# Patient Record
Sex: Female | Born: 1980 | Race: White | Hispanic: No | Marital: Married | Smoking: Current some day smoker
Health system: Southern US, Community
[De-identification: ages and names within clinical notes are randomized; demographics above are authoritative.]

## PROBLEM LIST (undated history)

## (undated) DIAGNOSIS — F319 Bipolar disorder, unspecified: Secondary | ICD-10-CM

## (undated) DIAGNOSIS — G629 Polyneuropathy, unspecified: Secondary | ICD-10-CM

## (undated) DIAGNOSIS — J189 Pneumonia, unspecified organism: Secondary | ICD-10-CM

## (undated) DIAGNOSIS — M1990 Unspecified osteoarthritis, unspecified site: Secondary | ICD-10-CM

## (undated) DIAGNOSIS — E559 Vitamin D deficiency, unspecified: Secondary | ICD-10-CM

## (undated) DIAGNOSIS — M810 Age-related osteoporosis without current pathological fracture: Secondary | ICD-10-CM

## (undated) DIAGNOSIS — Z8742 Personal history of other diseases of the female genital tract: Secondary | ICD-10-CM

## (undated) DIAGNOSIS — N83209 Unspecified ovarian cyst, unspecified side: Secondary | ICD-10-CM

## (undated) DIAGNOSIS — F410 Panic disorder [episodic paroxysmal anxiety] without agoraphobia: Secondary | ICD-10-CM

## (undated) DIAGNOSIS — Z8639 Personal history of other endocrine, nutritional and metabolic disease: Secondary | ICD-10-CM

## (undated) DIAGNOSIS — I639 Cerebral infarction, unspecified: Secondary | ICD-10-CM

## (undated) DIAGNOSIS — E669 Obesity, unspecified: Secondary | ICD-10-CM

## (undated) DIAGNOSIS — E119 Type 2 diabetes mellitus without complications: Secondary | ICD-10-CM

## (undated) DIAGNOSIS — F419 Anxiety disorder, unspecified: Secondary | ICD-10-CM

## (undated) DIAGNOSIS — M797 Fibromyalgia: Secondary | ICD-10-CM

## (undated) DIAGNOSIS — B369 Superficial mycosis, unspecified: Secondary | ICD-10-CM

## (undated) DIAGNOSIS — I10 Essential (primary) hypertension: Secondary | ICD-10-CM

## (undated) HISTORY — DX: Type 2 diabetes mellitus without complications: E11.9

## (undated) HISTORY — DX: Superficial mycosis, unspecified: B36.9

## (undated) HISTORY — DX: Cerebral infarction, unspecified: I63.9

## (undated) HISTORY — DX: Age-related osteoporosis without current pathological fracture: M81.0

## (undated) HISTORY — DX: Fibromyalgia: M79.7

## (undated) HISTORY — PX: THROAT SURGERY: SHX803

## (undated) HISTORY — PX: HERNIA REPAIR: SHX51

## (undated) HISTORY — DX: Personal history of other endocrine, nutritional and metabolic disease: Z86.39

## (undated) HISTORY — DX: Obesity, unspecified: E66.9

## (undated) HISTORY — DX: Unspecified ovarian cyst, unspecified side: N83.209

## (undated) HISTORY — DX: Personal history of other diseases of the female genital tract: Z87.42

## (undated) HISTORY — DX: Vitamin D deficiency, unspecified: E55.9

## (undated) HISTORY — DX: Polyneuropathy, unspecified: G62.9

## (undated) NOTE — *Deleted (*Deleted)
10/07/2019   I have seen and evaluated the patient after in hospital PEA arrest.  S  Post L BKA for osteomyelitis around noon today  Last seen normal around 3PM when she was given 1mg  dilaudid for pain control  Around 5PM noticed to be unresponsive and pulseless, PEA  4 minutes CPR  Initial ABG showing marked CO2 retention as well as metabolic acidosis  Underlying poorly controlled DM, bipolar, opiate dependence,CKD  O: Blood pressure 121/63, pulse 94, temperature 98.8 F (37.1 C), temperature source Oral, resp. rate 16, height 5\' 6"  (1.676 m), weight 104.3 kg, SpO2 99 %.  Unresponsive obese woman on vent L BKA site looks dressed without strikethrough Lungs clear Has no cough, no gag, pupils fixed/dilated, no corneals, no doll's, GCS3 Has normal compliance on ventilator Bedside echo with normal biventricular function  ABG 6.82/92/103 post ROSC  A:  Cardiac arrest due to acidemia- respiratory compromise on top of AKI on CKD causing reduced acid secretion.  Combine this with E coli bacteremia, recent surgery, she was unable to keep up with metabolic demand.  Post arrest poor neuro exam- difficult to prognosticate without fixing acidemia first.  AKI on CKD- nephro following  P:  - Standing tylenol, avoid fevers - Vent settings increased and patient given bicarb pushes and drip - Once pH normalizes and if patient remains unresponsive, may benefit from head CT for prognosticating purposes - Unasyn fine for now, levophed titrated to MAP 65 - Place foley - Guarded prognosis  The patient is critically ill with multiple organ systems failure and requires high complexity decision making for assessment and support, frequent evaluation and titration of therapies, application of advanced monitoring technologies and extensive interpretation of multiple databases. Critical Care Time devoted to patient care services described in this note independent of APP/resident  time is ***  minutes.   10/21/2019 Erskine Emery MD

---

## 2002-06-14 ENCOUNTER — Emergency Department (HOSPITAL_COMMUNITY): Admission: EM | Admit: 2002-06-14 | Discharge: 2002-06-14 | Payer: Self-pay | Admitting: *Deleted

## 2002-06-15 ENCOUNTER — Encounter: Payer: Self-pay | Admitting: *Deleted

## 2005-01-09 ENCOUNTER — Emergency Department (HOSPITAL_COMMUNITY): Admission: EM | Admit: 2005-01-09 | Discharge: 2005-01-09 | Payer: Self-pay | Admitting: Emergency Medicine

## 2005-01-12 ENCOUNTER — Emergency Department (HOSPITAL_COMMUNITY): Admission: EM | Admit: 2005-01-12 | Discharge: 2005-01-12 | Payer: Self-pay | Admitting: Emergency Medicine

## 2005-07-01 ENCOUNTER — Emergency Department (HOSPITAL_COMMUNITY): Admission: EM | Admit: 2005-07-01 | Discharge: 2005-07-01 | Payer: Self-pay | Admitting: Emergency Medicine

## 2005-11-10 ENCOUNTER — Emergency Department (HOSPITAL_COMMUNITY): Admission: EM | Admit: 2005-11-10 | Discharge: 2005-11-10 | Payer: Self-pay | Admitting: Emergency Medicine

## 2005-12-07 ENCOUNTER — Emergency Department (HOSPITAL_COMMUNITY): Admission: EM | Admit: 2005-12-07 | Discharge: 2005-12-08 | Payer: Self-pay | Admitting: Emergency Medicine

## 2006-03-15 ENCOUNTER — Emergency Department (HOSPITAL_COMMUNITY): Admission: EM | Admit: 2006-03-15 | Discharge: 2006-03-15 | Payer: Self-pay | Admitting: Emergency Medicine

## 2006-05-25 ENCOUNTER — Emergency Department (HOSPITAL_COMMUNITY): Admission: EM | Admit: 2006-05-25 | Discharge: 2006-05-25 | Payer: Self-pay | Admitting: Emergency Medicine

## 2006-08-18 ENCOUNTER — Emergency Department (HOSPITAL_COMMUNITY): Admission: EM | Admit: 2006-08-18 | Discharge: 2006-08-18 | Payer: Self-pay | Admitting: Emergency Medicine

## 2006-10-04 ENCOUNTER — Emergency Department (HOSPITAL_COMMUNITY): Admission: EM | Admit: 2006-10-04 | Discharge: 2006-10-04 | Payer: Self-pay | Admitting: Emergency Medicine

## 2006-11-26 ENCOUNTER — Emergency Department (HOSPITAL_COMMUNITY): Admission: EM | Admit: 2006-11-26 | Discharge: 2006-11-26 | Payer: Self-pay | Admitting: Emergency Medicine

## 2006-12-21 ENCOUNTER — Emergency Department (HOSPITAL_COMMUNITY): Admission: EM | Admit: 2006-12-21 | Discharge: 2006-12-21 | Payer: Self-pay | Admitting: Emergency Medicine

## 2007-01-30 ENCOUNTER — Emergency Department (HOSPITAL_COMMUNITY): Admission: EM | Admit: 2007-01-30 | Discharge: 2007-01-30 | Payer: Self-pay | Admitting: Emergency Medicine

## 2007-10-16 ENCOUNTER — Emergency Department: Payer: Self-pay | Admitting: Emergency Medicine

## 2008-01-24 ENCOUNTER — Emergency Department: Payer: Self-pay | Admitting: Emergency Medicine

## 2008-05-26 ENCOUNTER — Emergency Department: Payer: Self-pay | Admitting: Emergency Medicine

## 2008-08-14 ENCOUNTER — Ambulatory Visit: Payer: Self-pay

## 2008-12-16 ENCOUNTER — Emergency Department (HOSPITAL_COMMUNITY): Admission: EM | Admit: 2008-12-16 | Discharge: 2008-12-16 | Payer: Self-pay | Admitting: Emergency Medicine

## 2010-02-10 ENCOUNTER — Other Ambulatory Visit: Payer: Self-pay | Admitting: Obstetrics & Gynecology

## 2010-02-10 DIAGNOSIS — R102 Pelvic and perineal pain: Secondary | ICD-10-CM

## 2010-02-13 ENCOUNTER — Encounter (HOSPITAL_COMMUNITY): Payer: Self-pay

## 2010-03-09 ENCOUNTER — Other Ambulatory Visit (HOSPITAL_COMMUNITY)
Admission: RE | Admit: 2010-03-09 | Discharge: 2010-03-09 | Disposition: A | Discharge status: Home / Self Care | Payer: Medicaid Other | Source: Ambulatory Visit | Attending: Obstetrics and Gynecology | Admitting: Obstetrics and Gynecology

## 2010-03-09 ENCOUNTER — Other Ambulatory Visit: Payer: Self-pay | Admitting: Adult Health

## 2010-03-09 DIAGNOSIS — Z113 Encounter for screening for infections with a predominantly sexual mode of transmission: Secondary | ICD-10-CM | POA: Insufficient documentation

## 2010-03-09 DIAGNOSIS — Z01419 Encounter for gynecological examination (general) (routine) without abnormal findings: Secondary | ICD-10-CM | POA: Insufficient documentation

## 2010-06-02 ENCOUNTER — Ambulatory Visit (HOSPITAL_COMMUNITY)
Admission: RE | Admit: 2010-06-02 | Discharge: 2010-06-02 | Disposition: A | Discharge status: Home / Self Care | Payer: Medicaid Other | Source: Ambulatory Visit | Attending: Obstetrics & Gynecology | Admitting: Obstetrics & Gynecology

## 2010-06-02 ENCOUNTER — Other Ambulatory Visit: Payer: Self-pay | Admitting: Obstetrics & Gynecology

## 2010-06-02 DIAGNOSIS — R102 Pelvic and perineal pain: Secondary | ICD-10-CM

## 2010-06-02 DIAGNOSIS — N949 Unspecified condition associated with female genital organs and menstrual cycle: Secondary | ICD-10-CM | POA: Insufficient documentation

## 2010-06-02 DIAGNOSIS — N938 Other specified abnormal uterine and vaginal bleeding: Secondary | ICD-10-CM | POA: Insufficient documentation

## 2010-08-13 ENCOUNTER — Emergency Department (HOSPITAL_COMMUNITY)
Admission: EM | Admit: 2010-08-13 | Discharge: 2010-08-13 | Discharge status: Against Medical Advice | Payer: Medicaid Other | Attending: Emergency Medicine | Admitting: Emergency Medicine

## 2010-08-13 ENCOUNTER — Encounter: Payer: Self-pay | Admitting: *Deleted

## 2010-08-13 DIAGNOSIS — H9209 Otalgia, unspecified ear: Secondary | ICD-10-CM

## 2010-08-13 DIAGNOSIS — Z5320 Procedure and treatment not carried out because of patient's decision for unspecified reasons: Secondary | ICD-10-CM | POA: Insufficient documentation

## 2010-08-13 HISTORY — DX: Unspecified osteoarthritis, unspecified site: M19.90

## 2010-08-13 NOTE — ED Notes (Signed)
Pt states she has pain to her left ear that radiates down her neck and face.

## 2010-10-13 LAB — DIFFERENTIAL
Basophils Relative: 0
Lymphocytes Relative: 21
Monocytes Absolute: 0.6
Monocytes Relative: 4
Neutro Abs: 11.5 — ABNORMAL HIGH
Neutrophils Relative %: 75

## 2010-10-13 LAB — CBC
Hemoglobin: 12.9
RBC: 4.59
WBC: 15.5 — ABNORMAL HIGH

## 2011-11-21 ENCOUNTER — Emergency Department (HOSPITAL_COMMUNITY)
Admission: EM | Admit: 2011-11-21 | Discharge: 2011-11-21 | Disposition: A | Discharge status: Home / Self Care | Payer: Medicaid Other | Attending: Emergency Medicine | Admitting: Emergency Medicine

## 2011-11-21 ENCOUNTER — Encounter (HOSPITAL_COMMUNITY): Payer: Self-pay | Admitting: *Deleted

## 2011-11-21 DIAGNOSIS — Z794 Long term (current) use of insulin: Secondary | ICD-10-CM | POA: Insufficient documentation

## 2011-11-21 DIAGNOSIS — E119 Type 2 diabetes mellitus without complications: Secondary | ICD-10-CM | POA: Insufficient documentation

## 2011-11-21 DIAGNOSIS — Z8739 Personal history of other diseases of the musculoskeletal system and connective tissue: Secondary | ICD-10-CM | POA: Insufficient documentation

## 2011-11-21 DIAGNOSIS — Z79899 Other long term (current) drug therapy: Secondary | ICD-10-CM | POA: Insufficient documentation

## 2011-11-21 DIAGNOSIS — Z87891 Personal history of nicotine dependence: Secondary | ICD-10-CM | POA: Insufficient documentation

## 2011-11-21 DIAGNOSIS — H9209 Otalgia, unspecified ear: Secondary | ICD-10-CM | POA: Insufficient documentation

## 2011-11-21 DIAGNOSIS — J029 Acute pharyngitis, unspecified: Secondary | ICD-10-CM | POA: Insufficient documentation

## 2011-11-21 MED ORDER — AZITHROMYCIN 250 MG PO TABS: ORAL_TABLET | ORAL | Status: DC

## 2011-11-21 MED ORDER — AZITHROMYCIN 250 MG PO TABS
500.0000 mg | ORAL_TABLET | Freq: Once | ORAL | Status: AC
  Administered 2011-11-21: 500 mg via ORAL
  Filled 2011-11-21: qty 2

## 2011-11-21 NOTE — ED Notes (Signed)
Pt reports a sore swollen throat that started yesterday. Pt states unable to eat or drink anything. Pt spitting into a bottle. No breathing difficulty noted.

## 2011-11-21 NOTE — ED Provider Notes (Signed)
History     CSN: [id number]  Arrival date & time 11/21/11  0504   First MD Initiated Contact with Patient 11/21/11 0534      Chief Complaint  Patient presents with  . Sore Throat    (Consider location/radiation/quality/duration/timing/severity/associated sxs/prior treatment) HPI  Felicia Reynolds is a 31 y.o. female who presents to the Emergency Department complaining of sore throat that began yesterday accompanied by left ear pain with swallowing. Denies fever, chills, cough, nasal congestion. Unable to eat or drink due to pain.   PCP Dr. Legrand Rams  Past Medical History  Diagnosis Date  . Diabetes mellitus   . Arthritis     Past Surgical History  Procedure Date  . Hernia repair   . Cesarean section     No family history on file.  History  Substance Use Topics  . Smoking status: Former Smoker -- 0.5 packs/day    Quit date: 10/31/2011  . Smokeless tobacco: Not on file  . Alcohol Use: No    OB History    Grav Para Term Preterm Abortions TAB SAB Ect Mult Living                  Review of Systems  Constitutional: Negative for fever.       10 Systems reviewed and are negative for acute change except as noted in the HPI.  HENT: Positive for ear pain and sore throat. Negative for congestion.   Eyes: Negative for discharge and redness.  Respiratory: Negative for cough and shortness of breath.   Cardiovascular: Negative for chest pain.  Gastrointestinal: Negative for vomiting and abdominal pain.  Musculoskeletal: Negative for back pain.  Skin: Negative for rash.  Neurological: Negative for syncope, numbness and headaches.  Psychiatric/Behavioral:       No behavior change.    Allergies  Latex  Home Medications   Current Outpatient Rx  Name  Route  Sig  Dispense  Refill  . INSULIN GLARGINE 100 UNIT/ML [place] SOLN   Subcutaneous   Inject 30 Units into the skin at bedtime.          Marland Kitchen HYDROCODONE-ACETAMINOPHEN 5-500 MG PO TABS   Oral   Take 1 tablet by mouth  2 (two) times daily.           . MEGESTROL ACETATE 40 MG PO TABS   Oral   Take 40 mg by mouth as directed. TAPERED DOSE: Take 3 tablets daily for 5 days, then take 2 tablets daily for 5 days, then take 1 tablet daily for 5 days per Pharmacy record.          Marland Kitchen METFORMIN HCL 1000 MG PO TABS   Oral   Take 1,000 mg by mouth 2 (two) times daily with a meal.           . NYSTATIN-TRIAMCINOLONE 100000-0.1 UNIT/GM-% EX CREA   Topical   Apply 1 application topically 2 (two) times daily.           . TRAZODONE HCL 150 MG PO TABS   Oral   Take 150 mg by mouth 2 (two) times daily.             BP 162/92  Pulse 108  Temp 99.1 F (37.3 C) (Oral)  Resp 18  Ht 5' 6.5" (1.689 m)  Wt 236 lb (107.049 kg)  BMI 37.52 kg/m2  SpO2 95%  LMP 09/13/2011  Physical Exam  Nursing note and vitals reviewed. Constitutional: She appears well-developed and well-nourished.  Awake, alert, nontoxic appearance.  HENT:  Head: Normocephalic and atraumatic.  Right Ear: External ear normal.  Left Ear: External ear normal.       Posterior pharynx erythematous, no exudate.  Eyes: Right eye exhibits no discharge. Left eye exhibits no discharge.  Neck: Neck supple.  Cardiovascular: Normal heart sounds.   Pulmonary/Chest: Effort normal and breath sounds normal. She exhibits no tenderness.  Abdominal: Soft. There is no tenderness. There is no rebound.  Musculoskeletal: She exhibits no tenderness.       Baseline ROM, no obvious new focal weakness.  Neurological:       Mental status and motor strength appears baseline for patient and situation.  Skin: No rash noted.  Psychiatric: She has a normal mood and affect.    ED Course  Procedures (including critical care time) Results for orders placed during the hospital encounter of 11/21/11  RAPID STREP SCREEN      Component Value Range   Streptococcus, Group A Screen (Direct) NEGATIVE  NEGATIVE       MDM  Patient presents with a sore throat and  is unable to eat or drink. PE with erythema, no exudate. Strep negative. Will linitiate antibiotic therapy. Pt stable in ED with no significant deterioration in condition.The patient appears reasonably screened and/or stabilized for discharge and I doubt any other medical condition or other [facility] requiring further screening, evaluation, or treatment in the ED at this time prior to discharge.  MDM Reviewed: nursing note and vitals Interpretation: labs         Gypsy Balsam. Olin Hauser, MD 11/21/11 [phone]

## 2011-11-21 NOTE — ED Notes (Signed)
Pt alert & oriented x4, stable gait. Patient given discharge instructions, paperwork & prescription(s). Patient  instructed to stop at the registration desk to finish any additional paperwork. Patient verbalized understanding. Pt left department w/ no further questions. 

## 2011-11-28 ENCOUNTER — Emergency Department (HOSPITAL_COMMUNITY): Payer: Medicaid Other

## 2011-11-28 ENCOUNTER — Encounter (HOSPITAL_COMMUNITY): Payer: Self-pay | Admitting: Emergency Medicine

## 2011-11-28 ENCOUNTER — Inpatient Hospital Stay (HOSPITAL_COMMUNITY)
Admission: EM | Admit: 2011-11-28 | Discharge: 2011-11-29 | DRG: 153 | Disposition: A | Discharge status: Home / Self Care | Payer: Medicaid Other | Attending: Otolaryngology | Admitting: Otolaryngology

## 2011-11-28 DIAGNOSIS — Z87891 Personal history of nicotine dependence: Secondary | ICD-10-CM

## 2011-11-28 DIAGNOSIS — M129 Arthropathy, unspecified: Secondary | ICD-10-CM | POA: Diagnosis present

## 2011-11-28 DIAGNOSIS — J0430 Supraglottitis, unspecified, without obstruction: Secondary | ICD-10-CM | POA: Diagnosis present

## 2011-11-28 DIAGNOSIS — Z794 Long term (current) use of insulin: Secondary | ICD-10-CM

## 2011-11-28 DIAGNOSIS — J0510 Acute epiglottitis without obstruction: Secondary | ICD-10-CM

## 2011-11-28 DIAGNOSIS — J36 Peritonsillar abscess: Principal | ICD-10-CM | POA: Diagnosis present

## 2011-11-28 LAB — BASIC METABOLIC PANEL
CO2: 28 mEq/L (ref 19–32)
Chloride: 100 mEq/L (ref 96–112)
Glucose, Bld: 301 mg/dL — ABNORMAL HIGH (ref 70–99)
Sodium: 136 mEq/L (ref 135–145)

## 2011-11-28 LAB — URINALYSIS, ROUTINE W REFLEX MICROSCOPIC
Glucose, UA: >1000 mg/dL — AB
Nitrite: POSITIVE — AB
Specific Gravity, Urine: >1.03 — ABNORMAL HIGH (ref 1.005–1.030)
pH: 5.5 (ref 5.0–8.0)

## 2011-11-28 LAB — GLUCOSE, CAPILLARY
Glucose-Capillary: 297 mg/dL — ABNORMAL HIGH (ref 70–99)
Glucose-Capillary: 329 mg/dL — ABNORMAL HIGH (ref 70–99)
Glucose-Capillary: 337 mg/dL — ABNORMAL HIGH (ref 70–99)
Glucose-Capillary: 433 mg/dL — ABNORMAL HIGH (ref 70–99)

## 2011-11-28 LAB — MRSA PCR SCREENING: MRSA by PCR: NEGATIVE

## 2011-11-28 LAB — CBC WITH DIFFERENTIAL/PLATELET
Basophils Absolute: 0 10*3/uL (ref 0.0–0.1)
Eosinophils Relative: 0 % (ref 0–5)
HCT: 39.8 % (ref 36.0–46.0)
Lymphocytes Relative: 20 % (ref 12–46)
Lymphs Abs: 2.4 10*3/uL (ref 0.7–4.0)
MCV: 82.9 fL (ref 78.0–100.0)
Neutro Abs: 8.6 10*3/uL — ABNORMAL HIGH (ref 1.7–7.7)
Platelets: 236 10*3/uL (ref 150–400)
RBC: 4.8 MIL/uL (ref 3.87–5.11)
RDW: 12.3 % (ref 11.5–15.5)
WBC: 11.9 10*3/uL — ABNORMAL HIGH (ref 4.0–10.5)

## 2011-11-28 LAB — PREGNANCY, URINE: Preg Test, Ur: NEGATIVE

## 2011-11-28 LAB — URINE MICROSCOPIC-ADD ON

## 2011-11-28 LAB — RAPID STREP SCREEN (MED CTR MEBANE ONLY): Streptococcus, Group A Screen (Direct): NEGATIVE

## 2011-11-28 MED ORDER — POTASSIUM CHLORIDE IN NACL 20-0.45 MEQ/L-% IV SOLN
INTRAVENOUS | Status: DC
  Administered 2011-11-28: 13:00:00 via INTRAVENOUS
  Filled 2011-11-28 (×6): qty 1000

## 2011-11-28 MED ORDER — DEXTROSE 5 % IV SOLN
1.0000 g | Freq: Once | INTRAVENOUS | Status: AC
  Administered 2011-11-28: 1 g via INTRAVENOUS

## 2011-11-28 MED ORDER — MORPHINE SULFATE 2 MG/ML IJ SOLN: 2.0000 mg | INTRAMUSCULAR | Status: DC | PRN

## 2011-11-28 MED ORDER — DEXTROSE 5 % IV SOLN
INTRAVENOUS | Status: AC
  Filled 2011-11-28: qty 10

## 2011-11-28 MED ORDER — SODIUM CHLORIDE 0.9 % IV BOLUS (SEPSIS)
1000.0000 mL | Freq: Once | INTRAVENOUS | Status: AC
  Administered 2011-11-28: 1000 mL via INTRAVENOUS

## 2011-11-28 MED ORDER — DEXAMETHASONE SODIUM PHOSPHATE 4 MG/ML IJ SOLN
10.0000 mg | Freq: Once | INTRAMUSCULAR | Status: AC
  Administered 2011-11-28: 10 mg via INTRAVENOUS
  Filled 2011-11-28: qty 3

## 2011-11-28 MED ORDER — ALBUTEROL SULFATE (5 MG/ML) 0.5% IN NEBU
5.0000 mg | INHALATION_SOLUTION | RESPIRATORY_TRACT | Status: DC
  Administered 2011-11-28: 5 mg via RESPIRATORY_TRACT
  Filled 2011-11-28 (×2): qty 1

## 2011-11-28 MED ORDER — INFLUENZA VIRUS VACC SPLIT PF IM SUSP
0.5000 mL | INTRAMUSCULAR | Status: AC | PRN
  Administered 2011-11-29: 0.5 mL via INTRAMUSCULAR
  Filled 2011-11-28: qty 0.5

## 2011-11-28 MED ORDER — INSULIN ASPART 100 UNIT/ML ~~LOC~~ SOLN
0.0000 [IU] | Freq: Three times a day (TID) | SUBCUTANEOUS | Status: DC
  Administered 2011-11-28 (×2): 11 [IU] via SUBCUTANEOUS

## 2011-11-28 MED ORDER — CLINDAMYCIN PHOSPHATE 900 MG/50ML IV SOLN
INTRAVENOUS | Status: AC
  Administered 2011-11-28: 900 mg via INTRAVENOUS
  Filled 2011-11-28: qty 50

## 2011-11-28 MED ORDER — INSULIN GLARGINE 100 UNIT/ML ~~LOC~~ SOLN
30.0000 [IU] | Freq: Every day | SUBCUTANEOUS | Status: DC
  Administered 2011-11-28: 30 [IU] via SUBCUTANEOUS

## 2011-11-28 MED ORDER — LIDOCAINE HCL (PF) 1 % IJ SOLN
INTRAMUSCULAR | Status: AC
  Filled 2011-11-28: qty 5

## 2011-11-28 MED ORDER — CEFTRIAXONE SODIUM 1 G IJ SOLR
1.0000 g | Freq: Once | INTRAMUSCULAR | Status: DC
  Filled 2011-11-28 (×2): qty 10

## 2011-11-28 MED ORDER — PROMETHAZINE HCL 25 MG PO TABS: 12.5000 mg | ORAL_TABLET | Freq: Four times a day (QID) | ORAL | Status: DC | PRN

## 2011-11-28 MED ORDER — DEXTROSE 5 % IV SOLN
900.0000 mg | Freq: Once | INTRAVENOUS | Status: AC
  Filled 2011-11-28: qty 6

## 2011-11-28 MED ORDER — KCL IN DEXTROSE-NACL 20-5-0.45 MEQ/L-%-% IV SOLN
INTRAVENOUS | Status: DC
  Administered 2011-11-28: 12:00:00 via INTRAVENOUS
  Filled 2011-11-28 (×2): qty 1000

## 2011-11-28 MED ORDER — CLINDAMYCIN PHOSPHATE 600 MG/50ML IV SOLN
600.0000 mg | Freq: Three times a day (TID) | INTRAVENOUS | Status: AC
  Administered 2011-11-28 – 2011-11-29 (×3): 600 mg via INTRAVENOUS
  Filled 2011-11-28 (×3): qty 50

## 2011-11-28 MED ORDER — IOHEXOL 300 MG/ML  SOLN
100.0000 mL | Freq: Once | INTRAMUSCULAR | Status: AC | PRN
Start: 2011-11-28 — End: 2011-11-28
  Administered 2011-11-28: 100 mL via INTRAVENOUS

## 2011-11-28 MED ORDER — HYDROCODONE-ACETAMINOPHEN 7.5-500 MG/15ML PO SOLN: 15.0000 mL | ORAL | Status: DC | PRN

## 2011-11-28 MED ORDER — SODIUM CHLORIDE 0.9 % IV SOLN
Freq: Once | INTRAVENOUS | Status: AC
  Administered 2011-11-28: 07:00:00 via INTRAVENOUS

## 2011-11-28 MED ORDER — ALBUMIN HUMAN 5 % IV SOLN
INTRAVENOUS | Status: AC
  Filled 2011-11-28: qty 250

## 2011-11-28 NOTE — Procedures (Signed)
Preop diagnosis: Left peritonsillar infection, supraglottitis Postop diagnosis: same Procedure: Transnasal fiberoptic laryngoscopy Surgeon: Redmond Baseman Anesth: None Compl: None Findings: The patient reports feeling much better with nearly resolved pain and resumed ability to swallow.  Voice has returned to normal.  She can open her mouth more fully.  There remains some fullness to the left peritonsillar region, less so.  Uvula fairly midline.  Fiberoptic findings include nearly normal appearing epiglottis and only mild edema of the left arytenoid and aryepiglottic fold.  Pyriform sinus on the left is more visible. Description: After discussing risks, benefits, and alternatives, the fiberoptic laryngoscope was passed through the left nasal passage to view the pharynx and larynx.  After completion, the scope was removed.  The patient was returned to ICU care in stable condition. Plan: Supraglottic edema, along with her overall condition,is markedly improved.  I plan to keep her in ICU overnight for observation but likely transfer to regular room tomorrow, if continues to do well.  Continue clindamycin.  Diet will be ordered.

## 2011-11-28 NOTE — ED Notes (Signed)
Pt using suction to help with increase secretions in her mouth. nad noted at present moment.

## 2011-11-28 NOTE — ED Provider Notes (Signed)
History     CSN: [id number]  Arrival date & time 11/28/11  [id number]   First MD Initiated Contact with Patient 11/28/11 [phone]      Chief Complaint  Patient presents with  . Sore Throat    (Consider location/radiation/quality/duration/timing/severity/associated sxs/prior treatment) HPI Comments: Pt comes in with cc of sore throat. Pt reports that her sore sore throat started last week, however, since Wednesday, she has noticed change in voice, increased dysphagia odynophagia and now she has trismus. She also feels worse when laying down, prompting her to come to the ER. She was seen in the ER last week, states that she finished the AB that were prescribed. No n/v/f/c.   Patient is a 31 y.o. female presenting with pharyngitis. The history is provided by the patient.  Sore Throat Pertinent negatives include no chest pain, no abdominal pain, no headaches and no shortness of breath.    Past Medical History  Diagnosis Date  . Diabetes mellitus   . Arthritis     Past Surgical History  Procedure Date  . Hernia repair   . Cesarean section     History reviewed. No pertinent family history.  History  Substance Use Topics  . Smoking status: Former Smoker -- 0.5 packs/day    Quit date: 10/31/2011  . Smokeless tobacco: Not on file  . Alcohol Use: No    OB History    Grav Para Term Preterm Abortions TAB SAB Ect Mult Living                  Review of Systems  Constitutional: Positive for activity change.  HENT: Positive for sore throat, neck pain and voice change.   Respiratory: Negative for shortness of breath.   Cardiovascular: Negative for chest pain.  Gastrointestinal: Negative for nausea, vomiting and abdominal pain.  Genitourinary: Negative for dysuria.  Neurological: Negative for headaches.    Allergies  Latex  Home Medications   Current Outpatient Rx  Name  Route  Sig  Dispense  Refill  . AZITHROMYCIN 250 MG PO TABS      1 every day until finished.   4  tablet   0   . HYDROCODONE-ACETAMINOPHEN 5-500 MG PO TABS   Oral   Take 1 tablet by mouth 2 (two) times daily.           . INSULIN GLARGINE 100 UNIT/ML [place] SOLN   Subcutaneous   Inject 30 Units into the skin at bedtime.          . MEGESTROL ACETATE 40 MG PO TABS   Oral   Take 40 mg by mouth as directed. TAPERED DOSE: Take 3 tablets daily for 5 days, then take 2 tablets daily for 5 days, then take 1 tablet daily for 5 days per Pharmacy record.          Marland Kitchen METFORMIN HCL 1000 MG PO TABS   Oral   Take 1,000 mg by mouth 2 (two) times daily with a meal.           . NYSTATIN-TRIAMCINOLONE 100000-0.1 UNIT/GM-% EX CREA   Topical   Apply 1 application topically 2 (two) times daily.           . TRAZODONE HCL 150 MG PO TABS   Oral   Take 150 mg by mouth 2 (two) times daily.             BP 143/84  Pulse 80  Temp 99.2 F (37.3 C) (Oral)  Resp 20  Ht 5\' 7"  (1.702 m)  Wt 240 lb (108.863 kg)  BMI 37.59 kg/m2  SpO2 100%  LMP 10/08/2011  Physical Exam  Nursing note and vitals reviewed. Constitutional: She is oriented to person, place, and time. She appears well-developed.       Pt noted to be using suction at my arrival and has hoarse voice, with sats in the mid 90s.  HENT:  Head: Normocephalic and atraumatic.       Patient's uvula appear prominent, slightly deviated to the right, and mid tonsillar swelling, with no erythema, no visible exudates. Pt has cervical lymphadenopathy.   Eyes: Conjunctivae normal and EOM are normal. Pupils are equal, round, and reactive to light.  Neck: Normal range of motion. Neck supple.  Cardiovascular: Normal rate, regular rhythm and normal heart sounds.   Pulmonary/Chest: Effort normal and breath sounds normal. No respiratory distress. She has no wheezes.       No stridor  Abdominal: Soft. Bowel sounds are normal. She exhibits no distension. There is no tenderness. There is no rebound and no guarding.  Neurological: She is alert and  oriented to person, place, and time.  Skin: Skin is warm and dry.    ED Course  Procedures (including critical care time)  Labs Reviewed  CBC WITH DIFFERENTIAL - Abnormal; Notable for the following:    WBC 11.9 (*)     Neutro Abs 8.6 (*)     All other components within normal limits  BASIC METABOLIC PANEL - Abnormal; Notable for the following:    Glucose, Bld 301 (*)     All other components within normal limits  URINALYSIS, ROUTINE W REFLEX MICROSCOPIC - Abnormal; Notable for the following:    APPearance CLOUDY (*)     Specific Gravity, Urine >1.030 (*)     Glucose, UA >1000 (*)     Hgb urine dipstick SMALL (*)     Nitrite POSITIVE (*)     Leukocytes, UA TRACE (*)     All other components within normal limits  URINE MICROSCOPIC-ADD ON - Abnormal; Notable for the following:    Squamous Epithelial / LPF MANY (*)     Bacteria, UA MANY (*)     All other components within normal limits  GLUCOSE, CAPILLARY - Abnormal; Notable for the following:    Glucose-Capillary 297 (*)     All other components within normal limits  RAPID STREP SCREEN  PREGNANCY, URINE  URINE CULTURE   Dg Neck Soft Tissue  11/28/2011  *RADIOLOGY REPORT*  Clinical Data: Sore throat.  Drooling.  Epiglottitis.  NECK SOFT TISSUES - 1+ VIEW  Comparison: None.  Findings: There is mild prominence of the area epiglottic folds and epiglottis with subglottic tracheal narrowing suggesting inflammatory process.  No soft tissue gas collections.  No prevertebral soft tissue swelling.  No radiopaque foreign bodies.  IMPRESSION: Prominent epiglottis and aryepiglottic folds suggesting infection. No soft tissue gas collections or radiopaque foreign bodies.   Original Report Authenticated By: Lucienne Capers, M.D.    Dg Chest 1 View  11/28/2011  *RADIOLOGY REPORT*  Clinical Data: Rule out pneumonia.  Mediastinal widening.  CHEST - 1 VIEW  Comparison: None.  Findings: Shallow inspiration.  Normal heart size and pulmonary  vascularity.  No focal airspace consolidation in the lungs.  No blunting of costophrenic angles.  No pneumothorax.  Mediastinal contours appear intact.  IMPRESSION: No evidence of active pulmonary disease.   Original Report Authenticated By: Lucienne Capers, M.D.      1.  Epiglottitis       MDM  Pt comes in with cc of sore throat. Hx of diabetes.  Onset 1 week ago, and now there is hoarseness, dysphagia, odynophagia and trismus. Concerns for epiglottitis and deep space infection. Pt has no stridor, no respiratory distress and her O2 sats are WNL on 2 L O2. Lung exam is clear.  Bedside soft tissue neck and CXR ordered, which shows prominent epiglottis.  Urgent EMS transfer was requested and patient's air way was reassessed, and she continued to have intact air way. ENT was called, spoke with Dr Redmond Baseman, who is requesting CT soft tissue to r/o deep infection. Dr. Redmond Baseman also wanting an ED to ED transfer, and he would evaluate the patient when she arrived at [facility].  IV hydration and antibiotics ordered, decadron given.  Patient was taken to the CT suite, with all the emergent airway cart, she tolerated the CT well. EMS arrived post CT, and patient was transferred.  [place] notified about this transfer. I have requested EMS to call the [facility] ER 15 minutes prior to arrival so that Dr. Redmond Baseman can be called ahead of time.  Pt still has an intact airway at transfer. EMS asked to bag patient if there is any decompensation and inform cone right away, as she would need possible surgical airway in an event of compromise.  Patient not intubated at AP as there is no respiratory distress, and if intubation missed - there are possible grave consequences as there is no ENT or Anesthesia in house.  CRITICAL CARE Performed by: Varney Biles   Total critical care time: 45 minutes  Critical care time was exclusive of separately billable procedures and treating other patients.  Critical care was  necessary to treat or prevent imminent or life-threatening deterioration.  Critical care was time spent personally by me on the following activities: development of treatment plan with patient and/or surrogate as well as nursing, discussions with consultants, evaluation of patient's response to treatment, examination of patient, obtaining history from patient or surrogate, ordering and performing treatments and interventions, ordering and review of laboratory studies, ordering and review of radiographic studies, pulse oximetry and re-evaluation of patient's condition.       Varney Biles, MD 11/28/11 0800

## 2011-11-28 NOTE — ED Provider Notes (Signed)
Patient was transferred from any p.m. hospital for possible epiglottitis.  Patient seen and treated with Decadron and IV antibiotics.  When patient arrived she was awake alert.  No stridor but a muffled voice is present.  On physical exam she did have some left peritonsillar and tonsillar swelling.  Dry mucous membranes noted.  Dr. Redmond Baseman of otolaryngology was consulted and will come to bedside for evaluation.  CT had been done prior and results are available.  Dot Lanes, MD 11/28/11 [phone]

## 2011-11-28 NOTE — ED Notes (Signed)
Pt placed on 2L [place] and transported to CT with MD at bsd during transportation. Pt alert and suctioning mouth at times for secretion. IV abx started- see MAR. [facility] here for transport.

## 2011-11-28 NOTE — ED Notes (Signed)
Patient complaining of sore throat x 2 weeks, states she was seen here last week for same but pain is worse.

## 2011-11-28 NOTE — Progress Notes (Signed)
Utilization Review Completed.Felicia Reynolds T11/24/2013   

## 2011-11-28 NOTE — ED Notes (Signed)
Patient has large amount of saliva in oral cavity. States it hurts to swallow. Gave patient suction and taught how to suction saliva from oral area. Patient demonstrated correct technique.

## 2011-11-28 NOTE — Procedures (Signed)
Preop diagnosis: Left peritonsillar cellulitis/supraglottitis Postop diagnosis: Same Procedure: Transnasal fiberoptic laryngoscopy Surgeon: Redmond Baseman Anesth: Topical Compl: None Findings: Non-erythematous edema involves the left lateral pharyngeal wall and effaces the pyriform on that side.  Edema involves the epiglottis and left aryepitglottic fold/arytenoid.  Edema does not cover the glottis which is well seen from above and does not involve the glottis, either.  There is no obstruction or near obstruction. Description: After discussing risks, benefits, and alternatives, the left nasal passage was sprayed with 1% lidocaine and the flexible laryngoscope was passed through the left side to view the pharynx and larynx.  Findings are noted above.  The scope was then removed and she was returned to ER care in stable condition.

## 2011-11-28 NOTE — H&P (Signed)
Felicia Reynolds is an 31 y.o. female.   Chief Complaint: Sore throat HPI: 31 year old female with two week history of left-sided sore throat that has not responded to Z-pack.  Pain has worsened to include pain to the left ear and shoulder.  Pain is severe.  Swallowing has been very difficult due to pain.  Has only taken a few ounces of fluid in the past couple of weeks.  Her voice has become a bit muffled and breathing feels difficult at times.  Past Medical History  Diagnosis Date  . Diabetes mellitus   . Arthritis     Past Surgical History  Procedure Date  . Hernia repair   . Cesarean section     History reviewed. No pertinent family history. Social History:  reports that she quit smoking about 4 weeks ago. She does not have any smokeless tobacco history on file. She reports that she does not drink alcohol or use illicit drugs.  Allergies:  Allergies  Allergen Reactions  . Latex Itching and Rash     (Not in a hospital admission)  Results for orders placed during the hospital encounter of 11/28/11 (from the past 48 hour(s))  RAPID STREP SCREEN     Status: Normal   Collection Time   11/28/11  5:05 AM      Component Value Range Comment   Streptococcus, Group A Screen (Direct) NEGATIVE  NEGATIVE   PREGNANCY, URINE     Status: Normal   Collection Time   11/28/11  6:42 AM      Component Value Range Comment   Preg Test, Ur NEGATIVE  NEGATIVE   URINALYSIS, ROUTINE W REFLEX MICROSCOPIC     Status: Abnormal   Collection Time   11/28/11  6:42 AM      Component Value Range Comment   Color, Urine YELLOW  YELLOW    APPearance CLOUDY (*) CLEAR    Specific Gravity, Urine >1.030 (*) 1.005 - 1.030    pH 5.5  5.0 - 8.0    Glucose, UA >1000 (*) NEGATIVE mg/dL    Hgb urine dipstick SMALL (*) NEGATIVE    Bilirubin Urine NEGATIVE  NEGATIVE    Ketones, ur NEGATIVE  NEGATIVE mg/dL    Protein, ur NEGATIVE  NEGATIVE mg/dL    Urobilinogen, UA 0.2  0.0 - 1.0 mg/dL    Nitrite POSITIVE (*)  NEGATIVE    Leukocytes, UA TRACE (*) NEGATIVE   URINE MICROSCOPIC-ADD ON     Status: Abnormal   Collection Time   11/28/11  6:42 AM      Component Value Range Comment   Squamous Epithelial / LPF MANY (*) RARE    WBC, UA 7-10  <3 WBC/hpf    RBC / HPF 3-6  <3 RBC/hpf    Bacteria, UA MANY (*) RARE   CBC WITH DIFFERENTIAL     Status: Abnormal   Collection Time   11/28/11  6:46 AM      Component Value Range Comment   WBC 11.9 (*) 4.0 - 10.5 K/uL    RBC 4.80  3.87 - 5.11 MIL/uL    Hemoglobin 13.9  12.0 - 15.0 g/dL    HCT 39.8  36.0 - 46.0 %    MCV 82.9  78.0 - 100.0 fL    MCH 29.0  26.0 - 34.0 pg    MCHC 34.9  30.0 - 36.0 g/dL    RDW 12.3  11.5 - 15.5 %    Platelets 236  150 -  400 K/uL    Neutrophils Relative 72  43 - 77 %    Neutro Abs 8.6 (*) 1.7 - 7.7 K/uL    Lymphocytes Relative 20  12 - 46 %    Lymphs Abs 2.4  0.7 - 4.0 K/uL    Monocytes Relative 8  3 - 12 %    Monocytes Absolute 0.9  0.1 - 1.0 K/uL    Eosinophils Relative 0  0 - 5 %    Eosinophils Absolute 0.1  0.0 - 0.7 K/uL    Basophils Relative 0  0 - 1 %    Basophils Absolute 0.0  0.0 - 0.1 K/uL   BASIC METABOLIC PANEL     Status: Abnormal   Collection Time   11/28/11  6:46 AM      Component Value Range Comment   Sodium 136  135 - 145 mEq/L    Potassium 3.8  3.5 - 5.1 mEq/L    Chloride 100  96 - 112 mEq/L    CO2 28  19 - 32 mEq/L    Glucose, Bld 301 (*) 70 - 99 mg/dL    BUN 6  6 - 23 mg/dL    Creatinine, Ser 0.50  0.50 - 1.10 mg/dL    Calcium 9.3  8.4 - 10.5 mg/dL    GFR calc non Af Amer >90  >90 mL/min    GFR calc Af Amer >90  >90 mL/min   GLUCOSE, CAPILLARY     Status: Abnormal   Collection Time   11/28/11  7:42 AM      Component Value Range Comment   Glucose-Capillary 297 (*) 70 - 99 mg/dL    Dg Neck Soft Tissue  11/28/2011  *RADIOLOGY REPORT*  Clinical Data: Sore throat.  Drooling.  Epiglottitis.  NECK SOFT TISSUES - 1+ VIEW  Comparison: None.  Findings: There is mild prominence of the area epiglottic  folds and epiglottis with subglottic tracheal narrowing suggesting inflammatory process.  No soft tissue gas collections.  No prevertebral soft tissue swelling.  No radiopaque foreign bodies.  IMPRESSION: Prominent epiglottis and aryepiglottic folds suggesting infection. No soft tissue gas collections or radiopaque foreign bodies.   Original Report Authenticated By: Lucienne Capers, M.D.    Dg Chest 1 View  11/28/2011  *RADIOLOGY REPORT*  Clinical Data: Rule out pneumonia.  Mediastinal widening.  CHEST - 1 VIEW  Comparison: None.  Findings: Shallow inspiration.  Normal heart size and pulmonary vascularity.  No focal airspace consolidation in the lungs.  No blunting of costophrenic angles.  No pneumothorax.  Mediastinal contours appear intact.  IMPRESSION: No evidence of active pulmonary disease.   Original Report Authenticated By: Lucienne Capers, M.D.    Ct Soft Tissue Neck W Contrast  11/28/2011  *RADIOLOGY REPORT*  Clinical Data: Sore throat for 2 weeks.  Rule out deep infection. History diabetes.  CT NECK WITH CONTRAST  Technique:  Multidetector CT imaging of the neck was performed with intravenous contrast.  Contrast: 129mL OMNIPAQUE IOHEXOL 300 MG/ML  SOLN  Comparison: Plain film earlier in the morning.  Findings: No intracranial imaging is unremarkable.  Nasopharynx within normal limits.  Soft tissue fullness about the left tongue base and palatine tonsil region.  Example image 28/series 2.  No fluid density collection to suggest abscess. Thickened uvula.  There is also mild soft tissue fullness involving the left side the epiglottis and left aryepiglottic fold.  Example image 45 - 51 of series 2.  The airway remains patent.  Normal larynx.  Clear lung apices.  No inflammation in the upper mediastinum.  Normal thyroid, submandibular glands, and parotid glands.  Small submandibular nodes bilaterally.  Prominent jugular chain nodes, greater left than right are likely reactive.  The largest node is in  the left level two station and measures 1.5 cm on image 36/series 2.  All vascular structures enhance normally. No acute osseous abnormality.  IMPRESSION:  1.  Inflammation centered at the left tongue base and palatine tonsil region, most consistent with tonsillitis. 2.  Concurrent thickening of the epiglottis and left aryepiglottic fold, suspicious for inflammation. 3.  No evidence of drainable abscess. 4.  Prominent cervical nodes, likely reactive.   Original Report Authenticated By: Abigail Miyamoto, M.D.     Review of Systems  Constitutional: Positive for fever, chills and malaise/fatigue.  HENT: Positive for ear pain and sore throat.   Neurological: Positive for weakness.  All other systems reviewed and are negative.    Blood pressure 172/83, pulse 91, temperature 98.3 F (36.8 C), temperature source Oral, resp. rate 15, height 5\' 7"  (1.702 m), weight 108.863 kg (240 lb), last menstrual period 10/08/2011, SpO2 92.00%. Physical Exam  Constitutional: She is oriented to person, place, and time. She appears well-developed and well-nourished. Distressed: Appears uncomfortable.  No stridor.  No particular distress.  Voice somewhat muffled.  HENT:  Head: Normocephalic and atraumatic.  Right Ear: External ear normal.  Left Ear: External ear normal.  Nose: Nose normal.       Left peritonsillar fullness.  Oropharynx exam limited by mild trismus.  Eyes: Conjunctivae normal and EOM are normal. Pupils are equal, round, and reactive to light.  Neck: Normal range of motion. Neck supple.       Left zone 2 tender.  No fullness.  Cardiovascular: Normal rate.   Respiratory: Effort normal.  GI:       Did not examine.  Genitourinary:       Did not examine.  Musculoskeletal: Normal range of motion.  Neurological: She is alert and oriented to person, place, and time. No cranial nerve deficit.  Skin: Skin is warm and dry.  Psychiatric: She has a normal mood and affect. Her behavior is normal. Judgment and  thought content normal.     Assessment/Plan Left peritonsillar cellulitis/supraglottitis At the [facility], the patient was given IV clindamycin, Rocephin, and Decadron and was scanned by CT after a plain x-ray showed a thickened epiglottis.  I personally reviewed the CT scan which fits with the exam.  She appears to have a left peritonsillar infection that has extended down the lateral pharyngeal wall to involve the supraglottis.  Evaluation of the airway will be performed via flexible laryngoscopy.  Recommendations will be included on that note.  Silvana Holecek 11/28/2011, 8:51 AM

## 2011-11-28 NOTE — ED Notes (Signed)
MD Redmond Baseman at bedside.

## 2011-11-29 LAB — BASIC METABOLIC PANEL
Chloride: 97 mEq/L (ref 96–112)
Creatinine, Ser: 0.47 mg/dL — ABNORMAL LOW (ref 0.50–1.10)
GFR calc Af Amer: >90 mL/min (ref >90)
GFR calc non Af Amer: >90 mL/min (ref >90)
Potassium: 4.6 mEq/L (ref 3.5–5.1)

## 2011-11-29 LAB — CBC
HCT: 38.5 % (ref 36.0–46.0)
Hemoglobin: 13.2 g/dL (ref 12.0–15.0)
RDW: 12.3 % (ref 11.5–15.5)
WBC: 15.5 10*3/uL — ABNORMAL HIGH (ref 4.0–10.5)

## 2011-11-29 MED ORDER — CLINDAMYCIN HCL 300 MG PO CAPS: 300.0000 mg | ORAL_CAPSULE | Freq: Four times a day (QID) | ORAL | Status: DC

## 2011-11-29 MED ORDER — HYDROCODONE-ACETAMINOPHEN 7.5-500 MG/15ML PO SOLN: 15.0000 mL | ORAL | Status: DC | PRN

## 2011-11-29 NOTE — Discharge Summary (Signed)
Physician Discharge Summary  Patient ID: JESSICAH FABIAN MRN: [id number] DOB/AGE: 07-May-1980 31 y.o.  Admit date: 11/28/2011 Discharge date: 11/29/2011  Admission Diagnoses: left peritonsillar cellulitis/supraglottitis  Discharge Diagnoses: same Active Problems:  * No active hospital problems. *    Discharged Condition: good  Hospital Course: 31 year old female presented to the ER with worsening sore throat, muffled voice, and difficulty breathing.  Lateral neck x-ray showed thickened epiglottis and CT showed left peritonsillar cellulitis with edema extending to the left supraglottic larynx.  She was transferred from [facility] to [facility] and fiberoptic exam demonstrated non-obstructing edema of the epiglottis and left supraglottis.  She was treated with IV steroids and antibiotics and placed in ICU for close observation.  During the first day of admission, symptoms improved dramatically and repeat fiberoptic exam demonstrated marked improvement.  On hospital day 2, symptoms remain markedly improved and she is felt stable for discharge.  Increase in WBC likely reflects effect of steroid as opposed to worsening of infection.  Consults: None  Significant Diagnostic Studies: radiology: CT scan: neck  Treatments: antibiotics: clindamycin  Discharge Exam: Blood pressure 111/65, pulse 70, temperature 98.1 F (36.7 C), temperature source Oral, resp. rate 17, height 5\' 7"  (1.702 m), weight 108.863 kg (240 lb), last menstrual period 10/08/2011, SpO2 95.00%. General appearance: alert, cooperative, no distress and normal voice, no stridor Throat: Left peritonsillar fullness mild Neck: Non-tender with no fullness.  Disposition: 01-Home or Self Care  Discharge Orders    Future Orders Please Complete By Expires   Diet - low sodium heart healthy      Increase activity slowly      Discharge instructions      Comments:   Call Dr. Redmond Baseman with any worsening of symptoms.       Medication  List     As of 11/29/2011  7:24 AM    TAKE these medications         clindamycin 300 MG capsule   Commonly known as: CLEOCIN   Take 1 capsule (300 mg total) by mouth 4 (four) times daily.      HYDROcodone-acetaminophen 7.5-500 MG/15ML solution   Commonly known as: LORTAB   Take 15 mLs by mouth every 4 (four) hours as needed.      LANTUS SOLOSTAR 100 UNIT/ML injection   Generic drug: insulin glargine   Inject 30 Units into the skin at bedtime.         SignedRedmond Baseman, Nevayah Faust 11/29/2011, 7:24 AM

## 2011-12-02 LAB — URINE CULTURE

## 2012-09-21 ENCOUNTER — Ambulatory Visit: Payer: Self-pay | Admitting: Adult Health

## 2013-04-02 ENCOUNTER — Encounter: Payer: Self-pay | Admitting: Obstetrics & Gynecology

## 2013-04-02 ENCOUNTER — Ambulatory Visit (INDEPENDENT_AMBULATORY_CARE_PROVIDER_SITE_OTHER): Payer: Medicaid Other | Admitting: Obstetrics & Gynecology

## 2013-04-02 ENCOUNTER — Other Ambulatory Visit (HOSPITAL_COMMUNITY)
Admission: RE | Admit: 2013-04-02 | Discharge: 2013-04-02 | Disposition: A | Discharge status: Home / Self Care | Payer: Medicaid Other | Source: Ambulatory Visit | Attending: Obstetrics & Gynecology | Admitting: Obstetrics & Gynecology

## 2013-04-02 VITALS — BP 110/80 | Ht 66.0 in | Wt 262.0 lb

## 2013-04-02 DIAGNOSIS — Z01419 Encounter for gynecological examination (general) (routine) without abnormal findings: Secondary | ICD-10-CM

## 2013-04-02 DIAGNOSIS — Z0000 Encounter for general adult medical examination without abnormal findings: Secondary | ICD-10-CM

## 2013-04-02 DIAGNOSIS — R1032 Left lower quadrant pain: Secondary | ICD-10-CM

## 2013-04-02 DIAGNOSIS — Z124 Encounter for screening for malignant neoplasm of cervix: Secondary | ICD-10-CM | POA: Insufficient documentation

## 2013-04-02 DIAGNOSIS — R87810 Cervical high risk human papillomavirus (HPV) DNA test positive: Secondary | ICD-10-CM | POA: Insufficient documentation

## 2013-04-02 DIAGNOSIS — Z1151 Encounter for screening for human papillomavirus (HPV): Secondary | ICD-10-CM | POA: Insufficient documentation

## 2013-04-02 MED ORDER — NYSTATIN-TRIAMCINOLONE 100000-0.1 UNIT/GM-% EX OINT: 1.0000 "application " | TOPICAL_OINTMENT | Freq: Two times a day (BID) | CUTANEOUS | Status: DC

## 2013-04-02 NOTE — Progress Notes (Signed)
Patient ID: Felicia Reynolds, female   DOB: 06-19-1980, 32 y.o.   MRN: [id number] Subjective:     Felicia Reynolds is a 33 y.o. female here for a routine exam.  Patient's last menstrual period was 03/08/2013. No obstetric history on file. Birth Control Method:  none Menstrual Calendar(currently): regualr now  Current complaints: none.   Current acute medical issues:  diabetes   Recent Gynecologic History Patient's last menstrual period was 03/08/2013. Last Pap: 2011,  normal Last mammogram: na,    Past Medical History  Diagnosis Date  . Diabetes mellitus   . Arthritis     Past Surgical History  Procedure Laterality Date  . Hernia repair    . Cesarean section      OB History   Grav Para Term Preterm Abortions TAB SAB Ect Mult Living                  History   Social History  . Marital Status: Divorced    Spouse Name: N/A    Number of Children: N/A  . Years of Education: N/A   Social History Main Topics  . Smoking status: Former Smoker -- 0.50 packs/day    Quit date: 10/31/2011  . Smokeless tobacco: None  . Alcohol Use: No  . Drug Use: No  . Sexual Activity: None   Other Topics Concern  . None   Social History Narrative  . None    Family History  Problem Relation Age of Onset  . Breast cancer Mother   . Breast cancer Maternal Aunt   . Breast cancer Maternal Grandmother   . Cancer Maternal Aunt      Review of Systems  Review of Systems  Constitutional: Negative for fever, chills, weight loss, malaise/fatigue and diaphoresis.  HENT: Negative for hearing loss, ear pain, nosebleeds, congestion, sore throat, neck pain, tinnitus and ear discharge.   Eyes: Negative for blurred vision, double vision, photophobia, pain, discharge and redness.  Respiratory: Negative for cough, hemoptysis, sputum production, shortness of breath, wheezing and stridor.   Cardiovascular: Negative for chest pain, palpitations, orthopnea, claudication, leg swelling and PND.   Gastrointestinal: negative for abdominal pain. Negative for heartburn, nausea, vomiting, diarrhea, constipation, blood in stool and melena.  Genitourinary: Negative for dysuria, urgency, frequency, hematuria and flank pain.  Musculoskeletal: Negative for myalgias, back pain, joint pain and falls.  Skin: Negative for itching and rash.  Neurological: Negative for dizziness, tingling, tremors, sensory change, speech change, focal weakness, seizures, loss of consciousness, weakness and headaches.  Endo/Heme/Allergies: Negative for environmental allergies and polydipsia. Does not bruise/bleed easily.  Psychiatric/Behavioral: Negative for depression, suicidal ideas, hallucinations, memory loss and substance abuse. The patient is not nervous/anxious and does not have insomnia.        Objective:    Physical Exam  Vitals reviewed. Constitutional: She is oriented to person, place, and time. She appears well-developed and well-nourished.  HENT:  Head: Normocephalic and atraumatic.        Right Ear: External ear normal.  Left Ear: External ear normal.  Nose: Nose normal.  Mouth/Throat: Oropharynx is clear and moist.  Eyes: Conjunctivae and EOM are normal. Pupils are equal, round, and reactive to light. Right eye exhibits no discharge. Left eye exhibits no discharge. No scleral icterus.  Neck: Normal range of motion. Neck supple. No tracheal deviation present. No thyromegaly present.  Cardiovascular: Normal rate, regular rhythm, normal heart sounds and intact distal pulses.  Exam reveals no gallop and no friction rub.  No murmur heard. Respiratory: Effort normal and breath sounds normal. No respiratory distress. She has no wheezes. She has no rales. She exhibits no tenderness.  GI: Soft. Bowel sounds are normal. She exhibits no distension and no mass. There is no tenderness. There is no rebound and no guarding.  Genitourinary:  Breasts no masses skin changes or nipple changes bilaterally       Vulva is normal without lesions Vagina is pink moist without discharge Cervix normal in appearance and pap is done Uterus is normal size shape and contour Adnexa is negative with normal sized ovaries    Musculoskeletal: Normal range of motion. She exhibits no edema and no tenderness.  Neurological: She is alert and oriented to person, place, and time. She has normal reflexes. She displays normal reflexes. No cranial nerve deficit. She exhibits normal muscle tone. Coordination normal.  Skin: Skin is warm and dry. No rash noted. No erythema. No pallor.  Psychiatric: She has a normal mood and affect. Her behavior is normal. Judgment and thought content normal.       Assessment:    Healthy female exam.    Plan:    Follow up in: 2 weeks.   Has been havinbg LLQ pain sharp, will check sonogram 2 weeks

## 2013-04-02 NOTE — Addendum Note (Signed)
Addended by: Florian Buff on: 04/02/2013 04:55 PM   Modules accepted: Orders

## 2013-04-03 ENCOUNTER — Telehealth: Payer: Self-pay | Admitting: *Deleted

## 2013-04-03 NOTE — Telephone Encounter (Signed)
Pt insurance will not cover Nystatin/Triamcinolone ointment but will cover them individually.

## 2013-04-16 ENCOUNTER — Encounter: Payer: Self-pay | Admitting: Obstetrics & Gynecology

## 2013-04-16 ENCOUNTER — Ambulatory Visit (INDEPENDENT_AMBULATORY_CARE_PROVIDER_SITE_OTHER): Payer: Medicaid Other

## 2013-04-16 ENCOUNTER — Ambulatory Visit (INDEPENDENT_AMBULATORY_CARE_PROVIDER_SITE_OTHER): Payer: Medicaid Other | Admitting: Obstetrics & Gynecology

## 2013-04-16 ENCOUNTER — Other Ambulatory Visit: Payer: Self-pay | Admitting: Obstetrics & Gynecology

## 2013-04-16 VITALS — BP 100/60 | Wt 258.0 lb

## 2013-04-16 DIAGNOSIS — R1032 Left lower quadrant pain: Secondary | ICD-10-CM

## 2013-04-16 DIAGNOSIS — N949 Unspecified condition associated with female genital organs and menstrual cycle: Secondary | ICD-10-CM

## 2013-04-16 DIAGNOSIS — Z01419 Encounter for gynecological examination (general) (routine) without abnormal findings: Secondary | ICD-10-CM

## 2013-04-16 NOTE — Progress Notes (Signed)
Patient ID: SWATHI SOCKS, female   DOB: 04/13/1980, 33 y.o.   MRN: [id number] US Transvaginal Non-ob  04/16/2013   GYNECOLOGIC SONOGRAM   YSABELA IACOBELLI is a 33 y.o. G6 P1 A4 E1 LMP 03/08/2013 for a pelvic  sonogram for LLQ pain.  Uterus                      9.2 x 6.0 x 4.9 cm, anteverted uterus  Endometrium          6.9 mm, symmetrical,   Right ovary             3.7 x 3.2 x 2.3 cm,   Left ovary                3.1 x 2.0 x 1.6 cm,   No free fluid or adnexal masses noted within pelvis  Technician Comments:  Anteverted uterus, Endom-6.35mm, bilateral adnexa/ovaries appears WNL, no  free fluid or adnexal masses noted, dominant follicle noted on Rt   Alicia Amel 04/16/2013 2:42 PM  Clinical Impression and recommendations:  I have reviewed the sonogram results above, combined with the patient's  current clinical course, below are my impressions and any appropriate  recommendations for management based on the sonographic findings.  Normal gynecologic anatomy  No anatomical source for pelvic pain  Florian Buff 04/16/2013 3:10 PM      No gyn source for LLQ pain  Recommend follow up prn  Past Medical History  Diagnosis Date  . Diabetes mellitus   . Arthritis     Past Surgical History  Procedure Laterality Date  . Hernia repair    . Cesarean section      OB History   Grav Para Term Preterm Abortions TAB SAB Ect Mult Living   6 1 0 0 5 0 4 1 0 0       Allergies  Allergen Reactions  . Latex Itching and Rash    History   Social History  . Marital Status: Divorced    Spouse Name: N/A    Number of Children: N/A  . Years of Education: N/A   Social History Main Topics  . Smoking status: Former Smoker -- 0.50 packs/day    Quit date: 10/31/2011  . Smokeless tobacco: None  . Alcohol Use: No  . Drug Use: No  . Sexual Activity: None   Other Topics Concern  . None   Social History Narrative  . None    Family History  Problem Relation Age of Onset  . Breast cancer Mother   .  Breast cancer Maternal Aunt   . Breast cancer Maternal Grandmother   . Cancer Maternal Aunt

## 2013-04-19 NOTE — Telephone Encounter (Signed)
Message given to Dr. Elonda Husky nurse to see if Dr. Elonda Husky would prescribed another medication.

## 2013-04-23 ENCOUNTER — Telehealth: Payer: Self-pay | Admitting: *Deleted

## 2013-04-23 MED ORDER — DESOGESTREL-ETHINYL ESTRADIOL 0.15-30 MG-MCG PO TABS: 1.0000 | ORAL_TABLET | Freq: Every day | ORAL | Status: DC

## 2013-04-23 NOTE — Telephone Encounter (Signed)
Dr. Elonda Husky e scribed OCP. Pt was advised to check with drug store per earlier conversation. [place]

## 2013-04-23 NOTE — Telephone Encounter (Signed)
Spoke with pt. Pt states she started her period yesterday and wants to know if you will order birth control pills. Uses [facility] in [place]. Thanks!!! [employer]

## 2013-06-20 ENCOUNTER — Emergency Department (HOSPITAL_COMMUNITY)
Admission: EM | Admit: 2013-06-20 | Discharge: 2013-06-20 | Disposition: A | Discharge status: Home / Self Care | Payer: Medicaid Other | Attending: Emergency Medicine | Admitting: Emergency Medicine

## 2013-06-20 ENCOUNTER — Encounter (HOSPITAL_COMMUNITY): Payer: Self-pay | Admitting: Emergency Medicine

## 2013-06-20 DIAGNOSIS — Z794 Long term (current) use of insulin: Secondary | ICD-10-CM | POA: Insufficient documentation

## 2013-06-20 DIAGNOSIS — F17200 Nicotine dependence, unspecified, uncomplicated: Secondary | ICD-10-CM | POA: Insufficient documentation

## 2013-06-20 DIAGNOSIS — Z792 Long term (current) use of antibiotics: Secondary | ICD-10-CM | POA: Insufficient documentation

## 2013-06-20 DIAGNOSIS — H209 Unspecified iridocyclitis: Secondary | ICD-10-CM | POA: Insufficient documentation

## 2013-06-20 DIAGNOSIS — Z79899 Other long term (current) drug therapy: Secondary | ICD-10-CM | POA: Insufficient documentation

## 2013-06-20 DIAGNOSIS — M129 Arthropathy, unspecified: Secondary | ICD-10-CM | POA: Insufficient documentation

## 2013-06-20 DIAGNOSIS — E119 Type 2 diabetes mellitus without complications: Secondary | ICD-10-CM | POA: Insufficient documentation

## 2013-06-20 DIAGNOSIS — Z91040 Latex allergy status: Secondary | ICD-10-CM | POA: Insufficient documentation

## 2013-06-20 MED ORDER — TETRACAINE HCL 0.5 % OP SOLN
1.0000 [drp] | Freq: Once | OPHTHALMIC | Status: AC
  Administered 2013-06-20: 1 [drp] via OPHTHALMIC

## 2013-06-20 MED ORDER — IBUPROFEN 800 MG PO TABS
800.0000 mg | ORAL_TABLET | Freq: Once | ORAL | Status: AC
  Administered 2013-06-20: 800 mg via ORAL
  Filled 2013-06-20: qty 1

## 2013-06-20 MED ORDER — TETRACAINE HCL 0.5 % OP SOLN
OPHTHALMIC | Status: AC
  Filled 2013-06-20: qty 2

## 2013-06-20 MED ORDER — CYCLOPENTOLATE HCL 2 % OP SOLN: 2.0000 [drp] | Freq: Once | OPHTHALMIC | Status: DC

## 2013-06-20 MED ORDER — IBUPROFEN 600 MG PO TABS: 600.0000 mg | ORAL_TABLET | Freq: Three times a day (TID) | ORAL | Status: DC | PRN

## 2013-06-20 MED ORDER — CYCLOPENTOLATE HCL 2 % OP SOLN
2.0000 [drp] | Freq: Once | OPHTHALMIC | Status: AC
  Administered 2013-06-20: 2 [drp] via OPHTHALMIC
  Filled 2013-06-20: qty 2

## 2013-06-20 MED ORDER — PROPARACAINE HCL 0.5 % OP SOLN
1.0000 [drp] | Freq: Once | OPHTHALMIC | Status: DC
Start: 2013-06-20 — End: 2013-06-20
  Filled 2013-06-20: qty 15

## 2013-06-20 MED ORDER — FLUORESCEIN SODIUM 1 MG OP STRP
1.0000 | ORAL_STRIP | Freq: Once | OPHTHALMIC | Status: AC
  Administered 2013-06-20: 1 via OPHTHALMIC
  Filled 2013-06-20: qty 1

## 2013-06-20 MED ORDER — CYCLOPENTOLATE HCL 1 % OP SOLN
2.0000 [drp] | Freq: Once | OPHTHALMIC | Status: DC
  Filled 2013-06-20: qty 2

## 2013-06-20 MED ORDER — CYCLOPENTOLATE-PHENYLEPHRINE OP SOLN OPTIME - NO CHARGE
OPHTHALMIC | Status: AC
  Filled 2013-06-20: qty 2

## 2013-06-20 NOTE — Discharge Instructions (Signed)
Iritis °Iritis is an inflammation of the colored part of the eye (iris). Other parts at the front of the eye may also be inflamed. The iris is part of the middle layer of the eyeball which is called the uvea or the uveal track. Any part of the uveal track can become inflamed. The other portions of the uveal track are the choroid (the thin membrane under the outer layer of the eye), and the ciliary body (joins the choroid and the iris and produces the fluid in the front of the eye).  °It is extremely important to treat iritis early, as it may lead to internal eye damage causing scarring or diseases such as glaucoma. Some people have only one attack of iritis (in one or both eyes) in their lifetime, while others may get it many times. °CAUSES °Iritis can be associated with many different diseases, but mostly occurs in otherwise healthy people. Examples of diseases that can be associated with iritis include: °· Diseases where the body's immune system attacks tissues within your own body (autoimmune diseases). °· Infections (tuberculosis, gonorrhea, fungus infections, Lyme disease, infection of the lining of the heart). °· Trauma or injury. °· Eye diseases (acute glaucoma and others). °· Inflammation from other parts of the uveal track. °· Severe eye infections. °· Other rare diseases. °SYMPTOMS °· Eye pain or aching. °· Sensitivity to light. °· Loss of sight or blurred vision. °· Redness of the eye. This is often accompanied by a ring of redness around the outside of the cornea, or clear covering at the front of the eye (ciliary flush). °· Excessive tearing of the eye(s). °· A small pupil that does not enlarge in the dark and stays smaller than the other eye's pupil. °· A whitish area that obscures the lower part of the colored circular iris. Sometimes this is visible when looking at the eye, where the whitish area has a "fluid level" or flat top. This is called a "hypopyon" and is actually pus inside the eye. °Since  iritis causes the eye to become red, it is often confused with a much less dangerous form of "pink eye" or conjunctivitis. One of the most important symptoms is sensitivity to light. Anytime there is redness, discomfort in the eye(s) and extreme light sensitivity, it is extremely important to see an ophthalmologist as soon as possible. °TREATMENT °Acute iritis requires prompt medical evaluation by an eye specialist (ophthalmologist.) Treatment depends on the underlying cause but may include: °· Corticosteroid eye drops and dilating eye drops. Follow your caregiver's exact instructions on taking and stopping corticosteroid medications (drops or pills). °· Occasionally, the iritis will be so severe that it will not respond to commonly used medications. If this happens, it may be necessary to use steroid injections. The injections are given under the eye's outer surface. Sometimes oral medications are given. The decision on treatment used for iritis is usually made on an individual basis. °HOME CARE INSTRUCTIONS °Your care giver will give specific instructions regarding the use of eye medications or other medications. Be certain to follow all instructions in both taking and stopping the medications. °SEEK IMMEDIATE MEDICAL CARE IF: °· You have redness of one or both eye. °· You experience a great deal of light sensitivity. °· You have pain or aching in either eye. °MAKE SURE YOU:  °· Understand these instructions. °· Will watch your condition. °· Will get help right away if you are not doing well or get worse. °Document Released: 12/21/2004 Document Revised: 03/15/2011 Document   Reviewed: 06/10/2006 °[employer]® Patient Information ©2014 [employer]. ° °

## 2013-06-20 NOTE — ED Notes (Signed)
Pt c/o left eye pain. States she woke up unable to open the left eye. Attempting to open the eye is extremely painful per pt.

## 2013-06-20 NOTE — ED Provider Notes (Signed)
CSN: [id number]     Arrival date & time 06/20/13  0122 History   First MD Initiated Contact with Patient 06/20/13 0142     Chief Complaint  Patient presents with  . Eye Pain      HPI Patient reports increasing left thigh pain over the past 24 hours.  She states that her pain worsened this evening which prompted the ER.  She states the redness of her conjunctiva also has worsened over the past 24 hours.  She has a rather significant photophobia.  Pain is moderate to severe in severity at this time.  Start anti-inflammatories without improvement in her symptoms.  She denies fevers and chills.  No upper respiratory symptoms.  She has had prior trauma to her left eye reports that her vision out of her left eye is rather poor baseline.  It is not significantly changed.  She wears corrective lenses but does not wear contacts.  No discharge besides tearing from her left eye.   Past Medical History  Diagnosis Date  . Diabetes mellitus   . Arthritis    Past Surgical History  Procedure Laterality Date  . Hernia repair    . Cesarean section     Family History  Problem Relation Age of Onset  . Breast cancer Mother   . Breast cancer Maternal Aunt   . Breast cancer Maternal Grandmother   . Cancer Maternal Aunt    History  Substance Use Topics  . Smoking status: Current Every Day Smoker -- 0.50 packs/day    Last Attempt to Quit: 10/31/2011  . Smokeless tobacco: Not on file  . Alcohol Use: No   OB History   Grav Para Term Preterm Abortions TAB SAB Ect Mult Living   6 1 0 0 5 0 4 1 0 0      Review of Systems  All other systems reviewed and are negative.     Allergies  Latex  Home Medications   Prior to Admission medications   Medication Sig Start Date End Date Taking? Authorizing Provider  clindamycin (CLEOCIN) 300 MG capsule Take 1 capsule (300 mg total) by mouth 4 (four) times daily. 11/29/11   Melida Quitter, MD  desogestrel-ethinyl estradiol (APRI,EMOQUETTE,SOLIA) 0.15-30  MG-MCG tablet Take 1 tablet by mouth daily. 04/23/13   Florian Buff, MD  exenatide (BYETTA) 10 MCG/0.04ML SOPN injection Inject into the skin 2 (two) times daily with a meal.    Historical Provider, MD  HYDROcodone-acetaminophen (LORTAB) 7.5-500 MG/15ML solution Take 15 mLs by mouth every 4 (four) hours as needed. 11/29/11   Melida Quitter, MD  ibuprofen (ADVIL,MOTRIN) 600 MG tablet Take 1 tablet (600 mg total) by mouth every 8 (eight) hours as needed. 06/20/13   Hoy Morn, MD  insulin glargine (LANTUS SOLOSTAR) 100 UNIT/ML injection Inject 30 Units into the skin at bedtime.     Historical Provider, MD  insulin lispro (HUMALOG) 100 UNIT/ML injection Inject into the skin 3 (three) times daily before meals.    Historical Provider, MD  nystatin-triamcinolone ointment (MYCOLOG) Apply 1 application topically 2 (two) times daily. 04/02/13   Florian Buff, MD   BP 145/90  Pulse 104  Resp 20  Ht 5\' 4"  (1.626 m)  Wt 209 lb (94.802 kg)  BMI 35.86 kg/m2  SpO2 96%  LMP 06/05/2013 Physical Exam  Nursing note and vitals reviewed. Constitutional: She is oriented to person, place, and time. She appears well-developed and well-nourished.  HENT:  Head: Normocephalic.  Eyes: Lids are normal. Pupils  are equal, round, and reactive to light. Lids are everted and swept, no foreign bodies found. Right eye exhibits no chemosis. Left eye exhibits no chemosis. Left conjunctiva is injected. Left conjunctiva has no hemorrhage. Left eye exhibits normal extraocular motion and no nystagmus.  Slit lamp exam:      The left eye shows corneal flare. The left eye shows no corneal abrasion, no corneal ulcer, no hyphema, no hypopyon and no fluorescein uptake.  360-degree perilimbal injection on left, which increases in intensity as it approaches the limbus  Neck: Normal range of motion.  Pulmonary/Chest: Effort normal.  Abdominal: She exhibits no distension.  Musculoskeletal: Normal range of motion.  Neurological: She is  alert and oriented to person, place, and time.  Psychiatric: She has a normal mood and affect.    ED Course  Procedures (including critical care time) Labs Review Labs Reviewed - No data to display  Imaging Review No results found.   EKG Interpretation None      MDM   Final diagnoses:  Uveitis   Is to be treated I suspect uveitis.  She will call the ophthalmologist for followup tomorrow.  She was given a dose of cycloplegic here in the ER.  No signs of renal abrasion noted.  Overall well-appearing.  Discharge home.    Hoy Morn, MD 06/20/13 [phone]

## 2013-06-29 ENCOUNTER — Ambulatory Visit: Payer: Medicaid Other | Admitting: Obstetrics and Gynecology

## 2013-07-11 ENCOUNTER — Ambulatory Visit: Payer: Medicaid Other | Admitting: Obstetrics and Gynecology

## 2013-11-05 ENCOUNTER — Encounter (HOSPITAL_COMMUNITY): Payer: Self-pay | Admitting: Emergency Medicine

## 2014-05-13 ENCOUNTER — Encounter (HOSPITAL_COMMUNITY): Payer: Self-pay

## 2014-05-13 ENCOUNTER — Emergency Department (HOSPITAL_COMMUNITY)
Admission: EM | Admit: 2014-05-13 | Discharge: 2014-05-13 | Disposition: A | Discharge status: Home / Self Care | Payer: Medicaid Other | Attending: Emergency Medicine | Admitting: Emergency Medicine

## 2014-05-13 DIAGNOSIS — Z720 Tobacco use: Secondary | ICD-10-CM | POA: Insufficient documentation

## 2014-05-13 DIAGNOSIS — R42 Dizziness and giddiness: Secondary | ICD-10-CM | POA: Insufficient documentation

## 2014-05-13 DIAGNOSIS — Z79899 Other long term (current) drug therapy: Secondary | ICD-10-CM | POA: Insufficient documentation

## 2014-05-13 DIAGNOSIS — Z91040 Latex allergy status: Secondary | ICD-10-CM | POA: Insufficient documentation

## 2014-05-13 DIAGNOSIS — K047 Periapical abscess without sinus: Secondary | ICD-10-CM | POA: Insufficient documentation

## 2014-05-13 DIAGNOSIS — Z792 Long term (current) use of antibiotics: Secondary | ICD-10-CM | POA: Insufficient documentation

## 2014-05-13 DIAGNOSIS — Z793 Long term (current) use of hormonal contraceptives: Secondary | ICD-10-CM | POA: Insufficient documentation

## 2014-05-13 DIAGNOSIS — E119 Type 2 diabetes mellitus without complications: Secondary | ICD-10-CM | POA: Insufficient documentation

## 2014-05-13 DIAGNOSIS — M1990 Unspecified osteoarthritis, unspecified site: Secondary | ICD-10-CM | POA: Insufficient documentation

## 2014-05-13 LAB — I-STAT CHEM 8, ED
BUN: 17 mg/dL (ref 6–20)
CALCIUM ION: 1.19 mmol/L (ref 1.12–1.23)
CREATININE: 0.8 mg/dL (ref 0.44–1.00)
Chloride: 97 mmol/L — ABNORMAL LOW (ref 101–111)
GLUCOSE: 301 mg/dL — AB (ref 70–99)
HCT: 48 % — ABNORMAL HIGH (ref 36.0–46.0)
Hemoglobin: 16.3 g/dL — ABNORMAL HIGH (ref 12.0–15.0)
Potassium: 4.3 mmol/L (ref 3.5–5.1)
SODIUM: 136 mmol/L (ref 135–145)
TCO2: 24 mmol/L (ref 0–100)

## 2014-05-13 LAB — CBG MONITORING, ED: GLUCOSE-CAPILLARY: 369 mg/dL — AB (ref 70–99)

## 2014-05-13 MED ORDER — PENICILLIN V POTASSIUM 500 MG PO TABS: 500.0000 mg | ORAL_TABLET | Freq: Four times a day (QID) | ORAL | Status: AC

## 2014-05-13 MED ORDER — ONDANSETRON HCL 4 MG/2ML IJ SOLN
4.0000 mg | Freq: Once | INTRAMUSCULAR | Status: AC
  Administered 2014-05-13: 4 mg via INTRAVENOUS
  Filled 2014-05-13: qty 2

## 2014-05-13 MED ORDER — DEXTROSE 5 % IV SOLN
1.0000 g | Freq: Once | INTRAVENOUS | Status: AC
  Administered 2014-05-13: 1 g via INTRAVENOUS
  Filled 2014-05-13: qty 10

## 2014-05-13 MED ORDER — TRAMADOL HCL 50 MG PO TABS: 50.0000 mg | ORAL_TABLET | Freq: Four times a day (QID) | ORAL | Status: DC | PRN

## 2014-05-13 MED ORDER — METFORMIN HCL 500 MG PO TABS: 500.0000 mg | ORAL_TABLET | Freq: Two times a day (BID) | ORAL | Status: DC

## 2014-05-13 MED ORDER — HYDROMORPHONE HCL 1 MG/ML IJ SOLN
0.5000 mg | Freq: Once | INTRAMUSCULAR | Status: AC
  Administered 2014-05-13: 0.5 mg via INTRAVENOUS
  Filled 2014-05-13: qty 1

## 2014-05-13 MED ORDER — SODIUM CHLORIDE 0.9 % IV BOLUS (SEPSIS)
2000.0000 mL | Freq: Once | INTRAVENOUS | Status: AC
  Administered 2014-05-13: 2000 mL via INTRAVENOUS

## 2014-05-13 NOTE — ED Notes (Signed)
Pt c/o toothache for four days.  Also c/o dizziness.  Reports is diabetic and has been out of her insulin since Nov of last year.

## 2014-05-13 NOTE — ED Provider Notes (Addendum)
CSN: [id number]     Arrival date & time 05/13/14  1234 History   First MD Initiated Contact with Patient 05/13/14 1343     Chief Complaint  Patient presents with  . Dental Pain  . Dizziness     (Consider location/radiation/quality/duration/timing/severity/associated sxs/prior Treatment) Patient is a 34 y.o. female presenting with tooth pain and dizziness. The history is provided by the patient (the pt complains of a toothache).  Dental Pain Location:  Upper Upper teeth location: right upper. Quality:  Aching Severity:  Moderate Onset quality:  Gradual Timing:  Constant Progression:  Worsening Chronicity:  New Context: abscess   Relieved by:  Nothing Associated symptoms: no congestion and no headaches   Dizziness Associated symptoms: no chest pain, no diarrhea and no headaches     Past Medical History  Diagnosis Date  . Diabetes mellitus   . Arthritis    Past Surgical History  Procedure Laterality Date  . Hernia repair    . Cesarean section    . Throat surgery     Family History  Problem Relation Age of Onset  . Breast cancer Mother   . Breast cancer Maternal Aunt   . Breast cancer Maternal Grandmother   . Cancer Maternal Aunt    History  Substance Use Topics  . Smoking status: Current Every Day Smoker -- 0.50 packs/day    Last Attempt to Quit: 10/31/2011  . Smokeless tobacco: Not on file  . Alcohol Use: No   OB History    Gravida Para Term Preterm AB TAB SAB Ectopic Multiple Living   6 1 0 0 5 0 4 1 0 0      Review of Systems  Constitutional: Negative for appetite change and fatigue.  HENT: Negative for congestion, ear discharge and sinus pressure.        Toothache  Eyes: Negative for discharge.  Respiratory: Negative for cough.   Cardiovascular: Negative for chest pain.  Gastrointestinal: Negative for abdominal pain and diarrhea.  Genitourinary: Negative for frequency and hematuria.  Musculoskeletal: Negative for back pain.  Skin: Negative for rash.   Neurological: Positive for dizziness. Negative for seizures and headaches.  Psychiatric/Behavioral: Negative for hallucinations.      Allergies  Latex  Home Medications   Prior to Admission medications   Medication Sig Start Date End Date Taking? Authorizing Provider  desogestrel-ethinyl estradiol (APRI,EMOQUETTE,SOLIA) 0.15-30 MG-MCG tablet Take 1 tablet by mouth daily. 04/23/13  Yes Florian Buff, MD  clindamycin (CLEOCIN) 300 MG capsule Take 1 capsule (300 mg total) by mouth 4 (four) times daily. Patient not taking: Reported on 05/13/2014 11/29/11   Melida Quitter, MD  HYDROcodone-acetaminophen (LORTAB) 7.5-500 MG/15ML solution Take 15 mLs by mouth every 4 (four) hours as needed. Patient not taking: Reported on 05/13/2014 11/29/11   Melida Quitter, MD  ibuprofen (ADVIL,MOTRIN) 600 MG tablet Take 1 tablet (600 mg total) by mouth every 8 (eight) hours as needed. Patient not taking: Reported on 05/13/2014 06/20/13   Jola Schmidt, MD  metFORMIN (GLUCOPHAGE) 500 MG tablet Take 1 tablet (500 mg total) by mouth 2 (two) times daily with a meal. 05/13/14   Milton Ferguson, MD  nystatin-triamcinolone ointment [facility]) Apply 1 application topically 2 (two) times daily. Patient not taking: Reported on 05/13/2014 04/02/13   Florian Buff, MD  penicillin v potassium (VEETID) 500 MG tablet Take 1 tablet (500 mg total) by mouth 4 (four) times daily. 05/13/14 05/20/14  Milton Ferguson, MD  traMADol (ULTRAM) 50 MG tablet Take 1 tablet (50  mg total) by mouth every 6 (six) hours as needed. 05/13/14   Milton Ferguson, MD   BP 136/91 mmHg  Pulse 107  Temp(Src) 98.2 F (36.8 C) (Oral)  Resp 15  Ht 5\' 6"  (1.676 m)  Wt 198 lb (89.812 kg)  BMI 31.97 kg/m2  SpO2 98%  LMP 04/21/2014 Physical Exam  Constitutional: She is oriented to person, place, and time. She appears well-developed.  HENT:  Head: Normocephalic.  Tender left upper tooth  Eyes: Conjunctivae and EOM are normal. No scleral icterus.  Neck: Neck supple. No  thyromegaly present.  Cardiovascular: Normal rate and regular rhythm.  Exam reveals no gallop and no friction rub.   No murmur heard. Pulmonary/Chest: No stridor. She has no wheezes. She has no rales. She exhibits no tenderness.  Abdominal: She exhibits no distension. There is no tenderness. There is no rebound.  Musculoskeletal: Normal range of motion. She exhibits no edema.  Lymphadenopathy:    She has no cervical adenopathy.  Neurological: She is oriented to person, place, and time. She exhibits normal muscle tone. Coordination normal.  Skin: No rash noted. No erythema.  Psychiatric: She has a normal mood and affect. Her behavior is normal.    ED Course  Procedures (including critical care time) Labs Review Labs Reviewed  CBG MONITORING, ED - Abnormal; Notable for the following:    Glucose-Capillary 369 (*)    All other components within normal limits  I-STAT CHEM 8, ED - Abnormal; Notable for the following:    Chloride 97 (*)    Glucose, Bld 301 (*)    Hemoglobin 16.3 (*)    HCT 48.0 (*)    All other components within normal limits    Imaging Review No results found.   EKG Interpretation None      MDM   Final diagnoses:  Abscessed tooth    Abscess tooth,  tx with penn, ultram and dental follow up,  Dm tx with metformin and follow up     Milton Ferguson, MD 05/13/14 [place], MD 05/20/14 1549

## 2014-05-13 NOTE — Discharge Instructions (Signed)
°  Follow up with Dr. Mariel Sleet dentist this week.  Tell the dentist that you we seen here in the er.   Also get a family md

## 2014-05-13 NOTE — ED Notes (Signed)
CBG 369

## 2015-03-26 ENCOUNTER — Encounter (HOSPITAL_COMMUNITY): Payer: Self-pay | Admitting: Emergency Medicine

## 2015-03-26 ENCOUNTER — Emergency Department (HOSPITAL_COMMUNITY)
Admission: EM | Admit: 2015-03-26 | Discharge: 2015-03-26 | Disposition: A | Discharge status: Home / Self Care | Payer: Medicaid Other | Attending: Emergency Medicine | Admitting: Emergency Medicine

## 2015-03-26 ENCOUNTER — Emergency Department (HOSPITAL_COMMUNITY): Payer: Medicaid Other

## 2015-03-26 DIAGNOSIS — M1990 Unspecified osteoarthritis, unspecified site: Secondary | ICD-10-CM | POA: Diagnosis not present

## 2015-03-26 DIAGNOSIS — F17200 Nicotine dependence, unspecified, uncomplicated: Secondary | ICD-10-CM | POA: Insufficient documentation

## 2015-03-26 DIAGNOSIS — E119 Type 2 diabetes mellitus without complications: Secondary | ICD-10-CM | POA: Insufficient documentation

## 2015-03-26 DIAGNOSIS — Y999 Unspecified external cause status: Secondary | ICD-10-CM | POA: Diagnosis not present

## 2015-03-26 DIAGNOSIS — Y929 Unspecified place or not applicable: Secondary | ICD-10-CM | POA: Insufficient documentation

## 2015-03-26 DIAGNOSIS — W208XXA Other cause of strike by thrown, projected or falling object, initial encounter: Secondary | ICD-10-CM | POA: Diagnosis not present

## 2015-03-26 DIAGNOSIS — Y939 Activity, unspecified: Secondary | ICD-10-CM | POA: Diagnosis not present

## 2015-03-26 DIAGNOSIS — S82202A Unspecified fracture of shaft of left tibia, initial encounter for closed fracture: Secondary | ICD-10-CM

## 2015-03-26 DIAGNOSIS — S8992XA Unspecified injury of left lower leg, initial encounter: Secondary | ICD-10-CM | POA: Diagnosis present

## 2015-03-26 MED ORDER — HYDROCODONE-ACETAMINOPHEN 5-325 MG PO TABS: 2.0000 | ORAL_TABLET | ORAL | Status: DC | PRN

## 2015-03-26 NOTE — ED Notes (Signed)
Pt states she cannot wait to have her ct scan done as she has to pick up her child.  Pt states she will follow up with orthopedics and Sofia PA at bedside discussing this with pt.

## 2015-03-26 NOTE — ED Notes (Signed)
Pt attempted to give a urine sample, but was unable to void

## 2015-03-26 NOTE — ED Provider Notes (Signed)
CSN: [id number]     Arrival date & time 03/26/15  0820 History   First MD Initiated Contact with Patient 03/26/15 [phone]     Chief Complaint  Patient presents with  . Leg Injury    left     (Consider location/radiation/quality/duration/timing/severity/associated sxs/prior Treatment) Patient is a 35 y.o. female presenting with knee pain. The history is provided by the patient. No language interpreter was used.  Knee Pain Location:  Knee Time since incident:  1 day Injury: yes   Knee location:  L knee Pain details:    Quality:  Aching   Radiates to:  Does not radiate   Severity:  Moderate   Onset quality:  Gradual   Duration:  2 days Chronicity:  New Dislocation: no   Foreign body present:  No foreign bodies Prior injury to area:  No Relieved by:  Nothing Worsened by:  Nothing tried Ineffective treatments:  None tried Risk factors: no concern for non-accidental trauma   Pt reports she had a blow to her left knee from medial side.  Pt unable to tolerate weight bearing  Past Medical History  Diagnosis Date  . Diabetes mellitus   . Arthritis    Past Surgical History  Procedure Laterality Date  . Hernia repair    . Cesarean section    . Throat surgery     Family History  Problem Relation Age of Onset  . Breast cancer Mother   . Breast cancer Maternal Aunt   . Breast cancer Maternal Grandmother   . Cancer Maternal Aunt    Social History  Substance Use Topics  . Smoking status: Current Every Day Smoker -- 0.50 packs/day    Last Attempt to Quit: 10/31/2011  . Smokeless tobacco: None  . Alcohol Use: No   OB History    Gravida Para Term Preterm AB TAB SAB Ectopic Multiple Living   6 1 0 0 5 0 4 1 0 0      Review of Systems  All other systems reviewed and are negative.     Allergies  Latex  Home Medications   Prior to Admission medications   Medication Sig Start Date End Date Taking? Authorizing Provider  HYDROcodone-acetaminophen (NORCO/VICODIN) 5-325 MG  tablet Take 2 tablets by mouth every 4 (four) hours as needed. 03/26/15   Petros, PA-C   BP 131/106 mmHg  Pulse 107  Temp(Src) 98.1 F (36.7 C) (Oral)  Resp 18  Ht 5\' 6"  (1.676 m)  Wt 94.348 kg  BMI 33.59 kg/m2  SpO2 100%  LMP 03/15/2015 Physical Exam  Constitutional: She is oriented to person, place, and time. She appears well-developed and well-nourished.  HENT:  Head: Normocephalic.  Eyes: EOM are normal.  Neck: Normal range of motion.  Abdominal: She exhibits no distension.  Musculoskeletal: She exhibits tenderness.  Swollen bruised left lateral knee  From  nv and ns intact  Neurological: She is alert and oriented to person, place, and time.  Psychiatric: She has a normal mood and affect.  Nursing note and vitals reviewed.   ED Course  Procedures (including critical care time) Labs Review Labs Reviewed  POC URINE PREG, ED    Imaging Review Dg Knee Complete 4 Views Left  03/26/2015  CLINICAL DATA:  Left knee pain and swelling following blunt trauma laterally. EXAM: LEFT KNEE - COMPLETE 4+ VIEW COMPARISON:  None. FINDINGS: Almyra Brace is noted in the lateral aspect of the lateral tibial plateau with some regularity on the oblique image. It would  be difficult to exclude a small tibial plateau fracture. CT may be helpful for further evaluation. Small joint effusion is noted. IMPRESSION: Changes suggestive of a lateral tibial plateau fracture with only minimal displacement. Electronically Signed   By: Inez Catalina M.D.   On: 03/26/2015 09:17   I have personally reviewed and evaluated these images and lab results as part of my medical decision-making.   EKG Interpretation None      MDM Pt refused ct scan.  Pt reports she has to leave because she has to pick up her child.  Pt placed in a knee imbolizer and given crutches.  Pt advised to see Dr. Aline Brochure for evaluation.   Ice/Elevate    Final diagnoses:  Tibial fracture, left, closed, initial encounter   Meds ordered  this encounter  Medications  . HYDROcodone-acetaminophen (NORCO/VICODIN) 5-325 MG tablet    Sig: Take 2 tablets by mouth every 4 (four) hours as needed.    Dispense:  20 tablet    Refill:  0    Order Specific Question:  Supervising Provider    Answer:  Noemi Chapel [[id number]]  An After Visit Summary was printed and given to the patient.    Hollace Kinnier [place], PA-C 03/26/15 [place], MD 03/26/15 [phone]

## 2015-03-26 NOTE — ED Notes (Signed)
Pt ambulatory to bathroom.  Offered wheelchair and refused.

## 2015-03-26 NOTE — ED Notes (Signed)
[place] fell on left leg, rates pain 10/10.

## 2015-03-26 NOTE — Discharge Instructions (Signed)
Nondisplaced Tibial Plateau Fracture A tibial plateau fracture is a break in the bone that forms the bottom of your knee joint (tibia or shin bone). The lower end of your thigh bone (femur) forms the upper surface of your knee joint. The top of the tibia has a flat, smooth surface (tibial plateau). This part of the shin bone is made of softer bone than the shaft of the shin bone. If a strong force shoves your femur down into your tibial plateau, the tibial plateau can collapse or break away at the edges. This is also called an intra-articular fracture. A nondisplaced fracture means that the broken piece or pieces of your tibial plateau have not moved out of their normal position. This type of fracture can usually be treated without surgery. You will need to wear a brace or a splint and avoid using your knee to support your body weight while the fracture is healing. CAUSES Common causes of this type of fracture include:  Car accidents.  Jumps or falls from a significant height.  Injuries from high-energy sports or contact sports. RISK FACTORS You may be at higher risk for this type of fracture if:  You play high-energy sports or contact sports.  You have a history of bone infections.  You are an older person with a condition that causes weak bones (osteoporosis). SIGNS AND SYMPTOMS Signs and symptoms of a nondisplaced tibial plateau fracture begin immediately after the injury. They may include:  Pain that is worse when you use your knee to support your body weight.  Swelling of your knee.  Bruising around your knee. DIAGNOSIS Your health care provider may suspect a nondisplaced tibial plateau fracture based on your signs and symptoms, especially if you had a recent injury. Your health care provider will also do a physical exam. This may include imaging tests such as:  X-ray of your knee. This is used to confirm the diagnosis.  CT scan or MRI. This checks to make sure that no bones have  moved out of place and that there are no other injuries to your knee. TREATMENT Treatment for a nondisplaced tibial plateau fracture may involve wearing a brace or a splint to keep your leg in one position while it heals. You may also need to use crutches, a scooter, a walker, or a wheelchair so you can move around without using your injured leg to support your body weight. You may also be prescribed pain medicine. While your knee is healing, you may see a physical therapist. Your physical therapist will move your knee without putting weight on it (passive exercise). This helps to keep your knee from becoming stiff. After your leg has healed, your health care provider will show you how to do range-of-motion exercises to strengthen your knee muscles and prevent stiffness. HOME CARE INSTRUCTIONS If You Have a Brace or a Splint:  Wear it as directed by your health care provider. Remove it only as directed by your health care provider.  Loosen the brace or splint if your toes become numb and tingle, or if they turn cold and blue. Bathing  Cover the brace or splint with a watertight plastic bag to protect it from water while you bathe or shower. Do not let the brace or splint get wet. Managing Pain, Stiffness, and Swelling  If directed, apply ice to the injured area:  Put ice in a plastic bag.  Place a towel between your skin and the bag.  Leave the ice on for 20  minutes, 2-3 times a day.  Move your toes and ankle often to avoid stiffness and to lessen swelling.  Raise the injured area above the level of your heart while you are sitting or lying down. Driving  Do not drive or operate heavy machinery while taking pain medicine.  Do not drive while wearing a brace or a splint on a leg that you use for driving. Activity  Return to your normal activities as directed by your health care provider. Ask your health care provider what activities are safe for you.  Perform range-of-motion  exercises only as directed by your health care provider. Safety  Do not use the injured limb to support your body weight until your health care provider says that you can. Use crutches, a scooter, a walker, or a wheelchair as directed by your health care provider. General Instructions  Keep the brace or splint clean and dry.  Do not use any tobacco products, including cigarettes, chewing tobacco, or electronic cigarettes. Tobacco can delay bone healing. If you need help quitting, ask your health care provider.  Take medicines only as directed by your health care provider.  Keep all follow-up visits as directed by your health care provider. This is important. SEEK MEDICAL CARE IF:  Your pain is getting worse.  Your pain medicine is not helping. SEEK IMMEDIATE MEDICAL CARE IF:  You have very bad pain in your injured leg.  You have swelling or redness in your foot that is getting worse.  You begin to lose feeling in your foot or your toes.  Your foot or your toes are cold or turn blue.   This information is not intended to replace advice given to you by your health care provider. Make sure you discuss any questions you have with your health care provider.   Document Released: 09/30/2004 Document Revised: 01/11/2014 Document Reviewed: 08/08/2013 Elsevier Interactive Patient Education [employer].

## 2015-05-29 ENCOUNTER — Encounter: Payer: Self-pay | Admitting: Adult Health

## 2015-05-29 ENCOUNTER — Ambulatory Visit (INDEPENDENT_AMBULATORY_CARE_PROVIDER_SITE_OTHER): Payer: Medicaid Other | Admitting: Adult Health

## 2015-05-29 ENCOUNTER — Other Ambulatory Visit (HOSPITAL_COMMUNITY)
Admission: RE | Admit: 2015-05-29 | Discharge: 2015-05-29 | Disposition: A | Discharge status: Home / Self Care | Payer: Medicaid Other | Source: Ambulatory Visit | Attending: Adult Health | Admitting: Adult Health

## 2015-05-29 VITALS — BP 134/80 | HR 92 | Ht 66.25 in | Wt 238.0 lb

## 2015-05-29 DIAGNOSIS — E669 Obesity, unspecified: Secondary | ICD-10-CM

## 2015-05-29 DIAGNOSIS — Z01419 Encounter for gynecological examination (general) (routine) without abnormal findings: Secondary | ICD-10-CM

## 2015-05-29 DIAGNOSIS — Z8742 Personal history of other diseases of the female genital tract: Secondary | ICD-10-CM | POA: Insufficient documentation

## 2015-05-29 DIAGNOSIS — Z1151 Encounter for screening for human papillomavirus (HPV): Secondary | ICD-10-CM | POA: Diagnosis present

## 2015-05-29 DIAGNOSIS — B369 Superficial mycosis, unspecified: Secondary | ICD-10-CM | POA: Insufficient documentation

## 2015-05-29 DIAGNOSIS — R300 Dysuria: Secondary | ICD-10-CM

## 2015-05-29 DIAGNOSIS — Z0000 Encounter for general adult medical examination without abnormal findings: Secondary | ICD-10-CM

## 2015-05-29 DIAGNOSIS — Z8639 Personal history of other endocrine, nutritional and metabolic disease: Secondary | ICD-10-CM

## 2015-05-29 HISTORY — DX: Personal history of other endocrine, nutritional and metabolic disease: Z86.39

## 2015-05-29 HISTORY — DX: Personal history of other diseases of the female genital tract: Z87.42

## 2015-05-29 HISTORY — DX: Obesity, unspecified: E66.9

## 2015-05-29 HISTORY — DX: Superficial mycosis, unspecified: B36.9

## 2015-05-29 LAB — POCT URINALYSIS DIPSTICK
Blood, UA: NEGATIVE
KETONES UA: NEGATIVE
LEUKOCYTES UA: NEGATIVE
NITRITE UA: NEGATIVE
PROTEIN UA: NEGATIVE

## 2015-05-29 MED ORDER — FLUCONAZOLE 100 MG PO TABS: 100.0000 mg | ORAL_TABLET | Freq: Every day | ORAL | Status: DC

## 2015-05-29 NOTE — Patient Instructions (Signed)
Follow  Up in 1 week Physical in 1 year, pap in 3 if normal Take diflucan 1 daily x 2 weeks

## 2015-05-29 NOTE — Progress Notes (Signed)
Patient ID: Felicia Reynolds, female   DOB: 1980-04-01, 35 y.o.   MRN: [id number] History of Present Illness: Felicia Reynolds is a 35 year old white female, married in for well woman gyn exam and pap.Last pap 04/02/13 LSIL, she has not had insurance.She complains of dysuria and skin fungus, she is not on any diabetes meds.She said hit her right big toe about 6 weeks ago on concrete and lost toe nail, she says her feet are numb.She has lost weight.   Current Medications, Allergies, Past Medical History, Past Surgical History, Family History and Social History were reviewed in [employer] record.     Review of Systems: Patient denies any headaches, hearing loss, fatigue, blurred vision, shortness of breath, chest pain, abdominal pain, problems with bowel movements, or intercourse. No joint pain or mood swings.See HPI for positives.    Physical Exam:BP 134/80 mmHg  Pulse 92  Ht 5' 6.25" (1.683 m)  Wt 238 lb (107.956 kg)  BMI 38.11 kg/m2  LMP 05/08/2015 urine dipstick 4+ glucose General:  Well developed, well nourished, no acute distress Skin:  Warm and dry, has lots of loose skin Neck:  Midline trachea, normal thyroid, good ROM, no lymphadenopathy Lungs; Clear to auscultation bilaterally Breast:  No dominant palpable mass, retraction, or nipple discharge Cardiovascular: Regular rate and rhythm Abdomen:  Soft, non tender, no hepatosplenomegaly,obese and has redness and odor under panniculus, like skin fungus, painted with gentian violet Pelvic:  External genitalia is normal in appearance, no lesions.  The vagina is normal in appearance. Urethra has no lesions or masses. The cervix is bulbous.Pap with HPV performed and GC/CHL obtained.  Uterus is felt to be normal size, shape, and contour.  No adnexal masses or tenderness noted.Bladder is non tender, no masses felt.Difficult exam secondary to abdominal girth Extremities/musculoskeletal:  No swelling or varicosities noted, no clubbing  or cyanosis, toe nail coming in right big toe, has no feeling in feet on diabetic foot screen Psych:  No mood changes, alert and cooperative,seems happy She is aware she needs to take better care of herself will check labs to assess diabetes.  Impression: Well woman gyn exam and pap Skin fungus Dysuria Obesity  History of diabetes  History of abnormal pap   Plan: Check CBC,CMP,TSH and lipids,A1c and vitamin D GC/CHL sent Rx diflucan 100 mg #14 take 1 daily for 14 days Follow up in 1 week to discuss labs, if A1c up will refer to Dr Dorris Fetch, and address meds then Physical in 1 year, pap in 3 if normal Mammogram at 35

## 2015-05-30 LAB — COMPREHENSIVE METABOLIC PANEL
A/G RATIO: 1.3 (ref 1.2–2.2)
ALK PHOS: 89 IU/L (ref 39–117)
ALT: 6 IU/L (ref 0–32)
AST: 9 IU/L (ref 0–40)
Albumin: 4 g/dL (ref 3.5–5.5)
BUN/Creatinine Ratio: 17 (ref 9–23)
BUN: 12 mg/dL (ref 6–20)
Bilirubin Total: 0.3 mg/dL (ref 0.0–1.2)
CALCIUM: 9.9 mg/dL (ref 8.7–10.2)
CO2: 23 mmol/L (ref 18–29)
Chloride: 95 mmol/L — ABNORMAL LOW (ref 96–106)
Creatinine, Ser: 0.71 mg/dL (ref 0.57–1.00)
GFR calc Af Amer: 126 mL/min/{1.73_m2} (ref >59)
GFR, EST NON AFRICAN AMERICAN: 109 mL/min/{1.73_m2} (ref >59)
Globulin, Total: 3.2 g/dL (ref 1.5–4.5)
Glucose: 385 mg/dL — ABNORMAL HIGH (ref 65–99)
Potassium: 4.8 mmol/L (ref 3.5–5.2)
Sodium: 137 mmol/L (ref 134–144)
Total Protein: 7.2 g/dL (ref 6.0–8.5)

## 2015-05-30 LAB — CBC
Hematocrit: 46.4 % (ref 34.0–46.6)
Hemoglobin: 15.4 g/dL (ref 11.1–15.9)
MCH: 28.9 pg (ref 26.6–33.0)
MCHC: 33.2 g/dL (ref 31.5–35.7)
MCV: 87 fL (ref 79–97)
PLATELETS: 239 10*3/uL (ref 150–379)
RBC: 5.32 x10E6/uL — ABNORMAL HIGH (ref 3.77–5.28)
RDW: 13.2 % (ref 12.3–15.4)
WBC: 14.4 10*3/uL — AB (ref 3.4–10.8)

## 2015-05-30 LAB — VITAMIN D 25 HYDROXY (VIT D DEFICIENCY, FRACTURES): Vit D, 25-Hydroxy: 23.2 ng/mL — ABNORMAL LOW (ref 30.0–100.0)

## 2015-05-30 LAB — LIPID PANEL
CHOLESTEROL TOTAL: 166 mg/dL (ref 100–199)
Chol/HDL Ratio: 5.2 ratio units — ABNORMAL HIGH (ref 0.0–4.4)
HDL: 32 mg/dL — AB (ref >39)
LDL CALC: 102 mg/dL — AB (ref 0–99)
TRIGLYCERIDES: 162 mg/dL — AB (ref 0–149)
VLDL CHOLESTEROL CAL: 32 mg/dL (ref 5–40)

## 2015-05-30 LAB — TSH: TSH: 0.816 u[IU]/mL (ref 0.450–4.500)

## 2015-05-30 LAB — HEMOGLOBIN A1C
Est. average glucose Bld gHb Est-mCnc: 312 mg/dL
Hgb A1c MFr Bld: 12.5 % — ABNORMAL HIGH (ref 4.8–5.6)

## 2015-05-31 LAB — GC/CHLAMYDIA PROBE AMP
CHLAMYDIA, DNA PROBE: NEGATIVE
Neisseria gonorrhoeae by PCR: NEGATIVE

## 2015-06-04 LAB — CYTOLOGY - PAP

## 2015-06-05 ENCOUNTER — Encounter: Payer: Self-pay | Admitting: Adult Health

## 2015-06-05 ENCOUNTER — Ambulatory Visit (INDEPENDENT_AMBULATORY_CARE_PROVIDER_SITE_OTHER): Payer: Medicaid Other | Admitting: Adult Health

## 2015-06-05 ENCOUNTER — Telehealth: Payer: Self-pay | Admitting: Adult Health

## 2015-06-05 VITALS — BP 108/62 | HR 90 | Ht 66.5 in | Wt 236.0 lb

## 2015-06-05 DIAGNOSIS — E559 Vitamin D deficiency, unspecified: Secondary | ICD-10-CM | POA: Insufficient documentation

## 2015-06-05 DIAGNOSIS — E669 Obesity, unspecified: Secondary | ICD-10-CM | POA: Diagnosis not present

## 2015-06-05 DIAGNOSIS — E0800 Diabetes mellitus due to underlying condition with hyperosmolarity without nonketotic hyperglycemic-hyperosmolar coma (NKHHC): Secondary | ICD-10-CM | POA: Diagnosis not present

## 2015-06-05 DIAGNOSIS — B369 Superficial mycosis, unspecified: Secondary | ICD-10-CM

## 2015-06-05 DIAGNOSIS — Z6837 Body mass index (BMI) 37.0-37.9, adult: Secondary | ICD-10-CM

## 2015-06-05 DIAGNOSIS — E119 Type 2 diabetes mellitus without complications: Secondary | ICD-10-CM

## 2015-06-05 DIAGNOSIS — E1165 Type 2 diabetes mellitus with hyperglycemia: Secondary | ICD-10-CM | POA: Insufficient documentation

## 2015-06-05 HISTORY — DX: Vitamin D deficiency, unspecified: E55.9

## 2015-06-05 HISTORY — DX: Type 2 diabetes mellitus without complications: E11.9

## 2015-06-05 MED ORDER — METFORMIN HCL 1000 MG PO TABS: 1000.0000 mg | ORAL_TABLET | Freq: Two times a day (BID) | ORAL | Status: DC

## 2015-06-05 MED ORDER — SIMVASTATIN 20 MG PO TABS: 20.0000 mg | ORAL_TABLET | Freq: Every day | ORAL | Status: DC

## 2015-06-05 MED ORDER — LISINOPRIL 5 MG PO TABS: 5.0000 mg | ORAL_TABLET | Freq: Every day | ORAL | Status: DC

## 2015-06-05 MED ORDER — CHOLECALCIFEROL 125 MCG (5000 UT) PO CAPS: 5000.0000 [IU] | ORAL_CAPSULE | Freq: Every day | ORAL | Status: DC

## 2015-06-05 NOTE — Telephone Encounter (Signed)
Left message we need to talk about labs and you missed appt this am

## 2015-06-05 NOTE — Progress Notes (Signed)
Subjective:     Patient ID: Felicia Reynolds, female   DOB: Feb 21, 1980, 35 y.o.   MRN: [id number]  HPI Felicia Reynolds is a 35 year old white female, married back in follow up of skin fungus and to review labs.She says she is much better.  Review of Systems Patient denies any headaches, hearing loss, fatigue, blurred vision, shortness of breath, chest pain, abdominal pain, problems with bowel movements, urination, or intercourse. No joint pain or mood swings. Reviewed past medical,surgical, social and family history. Reviewed medications and allergies.     Objective:   Physical Exam BP 108/62 mmHg  Pulse 90  Ht 5' 6.5" (1.689 m)  Wt 236 lb (107.049 kg)  BMI 37.53 kg/m2  LMP 05/04/2017Skin is warm and dry and skin fungus is resolving.Has lots of loose skin.Reviewed labs, with her, A1c 12.5, vitamin D 23.2 and HDL32 with chol/ratio 5.2, will get her on metformin 1000 mg bid, vitamin D3 5000 IU daily  And zocor 20 mg and lisinopril 5 mg and refer to Dr Dorris Fetch for insulin dosing and care.   Pt aware pap normal and GC/CHL negative. Face time 15 minutes with 50% counseling and coordinating care. Assessment:       Diabetes, uncontrolled Skin fungus Obesity Vitamin D def    Plan:     Rx metform in 1000 mg #60 take 1 bid with 11 refills   Rx vitamin D 5000 IU daily Rx zocor 20 mg #30 take 1 daily with 11 refills Rx lisinopril 5 mg #30 take 1 daily with 11 refills Referred to Dr Dorris Fetch Check BS qid and bring to visit in 2 weeks to review  Physical in 1 year,pap in 3 years

## 2015-06-05 NOTE — Patient Instructions (Signed)
Check blood sugars 4 x daily Use condoms Take meds decrease carbs and increasing walking Refer to Dr Dorris Fetch Follow up in 2 weeks

## 2015-06-19 ENCOUNTER — Encounter: Payer: Self-pay | Admitting: Adult Health

## 2015-06-19 ENCOUNTER — Ambulatory Visit (INDEPENDENT_AMBULATORY_CARE_PROVIDER_SITE_OTHER): Payer: Medicaid Other | Admitting: Adult Health

## 2015-06-19 VITALS — BP 122/70 | HR 90 | Ht 66.0 in | Wt 243.0 lb

## 2015-06-19 DIAGNOSIS — E0800 Diabetes mellitus due to underlying condition with hyperosmolarity without nonketotic hyperglycemic-hyperosmolar coma (NKHHC): Secondary | ICD-10-CM

## 2015-06-19 DIAGNOSIS — B369 Superficial mycosis, unspecified: Secondary | ICD-10-CM

## 2015-06-19 DIAGNOSIS — L987 Excessive and redundant skin and subcutaneous tissue: Secondary | ICD-10-CM | POA: Diagnosis not present

## 2015-06-19 MED ORDER — FLUCONAZOLE 100 MG PO TABS: 100.0000 mg | ORAL_TABLET | Freq: Every day | ORAL | Status: DC

## 2015-06-19 NOTE — Patient Instructions (Signed)
Do not take zocor and diflucan together Follow up prn

## 2015-06-19 NOTE — Progress Notes (Signed)
Subjective:     Patient ID: Felicia Reynolds, female   DOB: Jan 23, 1980, 35 y.o.   MRN: [id number]  HPI Felicia Reynolds is a 35 year old white female in for follow up of skin fungus and diabetes, has started back on insulin and testing Blood sugars, has seen PC again recently for the insulin and has appt with Dr Felicia Reynolds in July.   Review of Systems +skin fungus Reviewed past medical,surgical, social and family history. Reviewed medications and allergies.     Objective:   Physical Exam BP 122/70 mmHg  Pulse 90  Ht 5\' 6"  (1.676 m)  Wt 243 lb (110.224 kg)  BMI 39.24 kg/m2  LMP 06/06/2015  Has some skin fungus under panniculus on left, but not as bad as before,has lots of loose skin that contributes, will try to get surgical consult with Felicia Reynolds.    Assessment:     Diabetes  Skin fungus  Loose skin    Plan:      Refilled diflucan 100 mg take 1 daily x 10 days with 1 refill Keep appt with Dr Felicia Reynolds 7/3 Referred to [facility] Follow up prn

## 2015-07-07 ENCOUNTER — Ambulatory Visit: Payer: Medicaid Other | Admitting: "Endocrinology

## 2015-07-11 ENCOUNTER — Ambulatory Visit: Payer: Medicaid Other | Admitting: "Endocrinology

## 2015-07-14 ENCOUNTER — Ambulatory Visit: Payer: Medicaid Other | Admitting: "Endocrinology

## 2015-08-11 ENCOUNTER — Encounter: Payer: Self-pay | Admitting: Orthopedic Surgery

## 2015-08-11 ENCOUNTER — Ambulatory Visit (INDEPENDENT_AMBULATORY_CARE_PROVIDER_SITE_OTHER): Payer: Medicaid Other | Admitting: Orthopedic Surgery

## 2015-08-11 VITALS — BP 110/76 | Ht 67.5 in | Wt 237.0 lb

## 2015-08-11 DIAGNOSIS — M545 Low back pain, unspecified: Secondary | ICD-10-CM

## 2015-08-11 DIAGNOSIS — M549 Dorsalgia, unspecified: Secondary | ICD-10-CM

## 2015-08-11 NOTE — Progress Notes (Signed)
Chief Complaint  Patient presents with  . Back Pain   HPI 35 year old female presents with long history of ongoing back pain since a fall down some stairs in 2007. She presents now severely compromised in her overall ability to stand up straight walk without her legs giving way she's had multiple falls. Her pain is 10 out of 10. She has not responded to Aleve, ibuprofen, Lyrica, hydrocodone up to 7.5 mg.  Her pain is constant it is associated with sharp throbbing stabbing pain in both legs left worse than right area her symptoms include locking and stiffness giving way and numbness of both legs. She has not had any physical therapy. She says her nurse practitioners leaving and she is out of her pain medicine.   Review of Systems  Constitutional: Positive for malaise/fatigue.  Eyes: Positive for blurred vision.  Cardiovascular: Positive for leg swelling.  Gastrointestinal: Negative.   Genitourinary: Negative.   Musculoskeletal: Positive for back pain, joint pain and myalgias.  Neurological: Positive for tingling and sensory change.  Endo/Heme/Allergies: Negative.   Psychiatric/Behavioral: Positive for depression. The patient is nervous/anxious.     Past Medical History:  Diagnosis Date  . Arthritis   . Diabetes ([place]) 06/05/2015  . Diabetes mellitus   . Fibromyalgia   . History of abnormal cervical Pap smear 05/29/2015  . History of diabetes mellitus, type II 05/29/2015  . Neuropathy ([place])   . Obesity 05/29/2015  . Osteoporosis   . Superficial fungus infection of skin 05/29/2015  . Vitamin D deficiency 06/05/2015    Past Surgical History:  Procedure Laterality Date  . CESAREAN SECTION    . HERNIA REPAIR    . THROAT SURGERY     Family History  Problem Relation Age of Onset  . Breast cancer Mother   . Breast cancer Maternal Aunt   . Breast cancer Maternal Grandmother   . Cancer Maternal Aunt    Social History  Substance Use Topics  . Smoking status: Current Every Day Smoker    Packs/day: 1.00    Years: 13.00    Types: Cigarettes    Last attempt to quit: 10/31/2011  . Smokeless tobacco: Never Used  . Alcohol use No    Current Outpatient Prescriptions:  .  Cholecalciferol 5000 units capsule, Take 1 capsule (5,000 Units total) by mouth daily., Disp: , Rfl:  .  fluconazole (DIFLUCAN) 100 MG tablet, Take 1 tablet (100 mg total) by mouth daily., Disp: 10 tablet, Rfl: 1 .  HYDROcodone-acetaminophen (NORCO) 7.5-325 MG tablet, Take 1 tablet by mouth 3 (three) times daily as needed for moderate pain., Disp: , Rfl:  .  insulin regular (NOVOLIN R,HUMULIN R) 100 units/mL injection, Inject into the skin. According to sliding scale once daily, Disp: , Rfl:  .  LANTUS SOLOSTAR 100 UNIT/ML Solostar Pen, INJECT 55 UNITS SUBCUTANEOUSLY AT BEDTIME DAILY, Disp: , Rfl: 3 .  lisinopril (PRINIVIL,ZESTRIL) 5 MG tablet, Take 1 tablet (5 mg total) by mouth daily., Disp: 30 tablet, Rfl: 11 .  metFORMIN (GLUCOPHAGE) 1000 MG tablet, Take 1 tablet (1,000 mg total) by mouth 2 (two) times daily with a meal., Disp: 60 tablet, Rfl: 11 .  simvastatin (ZOCOR) 20 MG tablet, Take 1 tablet (20 mg total) by mouth daily., Disp: 30 tablet, Rfl: 11  BP 110/76   Ht 5' 7.5" (1.715 m)   Wt 237 lb (107.5 kg)   BMI 36.57 kg/m   Physical Exam  Constitutional: She is oriented to person, place, and time. She appears well-developed  and well-nourished. No distress.  Cardiovascular: Normal rate and intact distal pulses.   Neurological: She is alert and oriented to person, place, and time. She has normal reflexes. She exhibits normal muscle tone. Coordination normal.  Skin: Skin is warm and dry. No rash noted. She is not diaphoretic. No erythema. No pallor.  Psychiatric: She has a normal mood and affect. Her behavior is normal. Judgment and thought content normal.    Ortho Exam She has tenderness down her entire lumbar spine starting in the cervical region including thoracic region but it really gets intense  in the lumbar region including the left and right lateral musculature. The lower lumbar levels 45 and L5-S1 seem to be the most tender. She has a positive straight leg raise on the left negative on the right but a positive Lasegue's sign on the right.  She has decreased sensation L4, L5, and S1 and both lower extremities.  Back range of motion is severely limited she cannot extend she stands in flexion  Fortunately her knees and hips are stable right and left. She has mild weakness in her dorsiflexors.  Her skin shows some mild bruising around the knee area is frequent falls. She has normal distal pulses and temperature without edema sensation as described    ASSESSMENT: My personal interpretation of the images:  Plain films are included on a disc and reports from 2017 2015 and 2010 show some disc space narrowing at L4-5 and L5-S1 according to reports   Diagnosis lower back pain with bilateral leg pain PLAN Recommend physical therapy for 4 weeks and then a return visit if no improvement then recommend MRI  Arther Abbott, MD 08/11/2015 9:32 AM

## 2015-08-21 ENCOUNTER — Other Ambulatory Visit: Payer: Self-pay | Admitting: Adult Health

## 2015-09-04 ENCOUNTER — Encounter: Payer: Self-pay | Admitting: Orthopedic Surgery

## 2015-09-10 ENCOUNTER — Ambulatory Visit: Payer: Medicaid Other | Admitting: Orthopedic Surgery

## 2015-09-10 ENCOUNTER — Encounter: Payer: Self-pay | Admitting: Orthopaedic Surgery

## 2015-10-03 ENCOUNTER — Other Ambulatory Visit: Payer: Self-pay | Admitting: Adult Health

## 2015-10-09 ENCOUNTER — Ambulatory Visit: Payer: Medicaid Other | Admitting: Obstetrics & Gynecology

## 2015-10-13 ENCOUNTER — Ambulatory Visit: Payer: Medicaid Other | Admitting: Orthopedic Surgery

## 2015-10-24 ENCOUNTER — Ambulatory Visit (INDEPENDENT_AMBULATORY_CARE_PROVIDER_SITE_OTHER): Payer: Medicaid Other | Admitting: Orthopedic Surgery

## 2015-10-24 ENCOUNTER — Encounter: Payer: Self-pay | Admitting: Orthopedic Surgery

## 2015-10-24 DIAGNOSIS — M545 Low back pain, unspecified: Secondary | ICD-10-CM

## 2015-10-24 DIAGNOSIS — G8929 Other chronic pain: Secondary | ICD-10-CM

## 2015-10-24 MED ORDER — CYCLOBENZAPRINE HCL 10 MG PO TABS: 10.0000 mg | ORAL_TABLET | Freq: Two times a day (BID) | ORAL | 1 refills | Status: DC | PRN

## 2015-10-24 MED ORDER — NAPROXEN 500 MG PO TABS: 500.0000 mg | ORAL_TABLET | Freq: Two times a day (BID) | ORAL | 2 refills | Status: DC

## 2015-10-24 NOTE — Progress Notes (Signed)
Patient ID: Felicia Reynolds, female   DOB: 03-02-1980, 35 y.o.   MRN: [id number]  Chief Complaint  Patient presents with  . Follow-up    BACK PAIN    HPI Felicia Reynolds is a 35 y.o. female.   HPI  35 year old female sent for physical therapy for back pain did not go because of Medicaid card issue  Still symptomatic  Review of Systems Review of Systems  Gastrointestinal: Negative.   Genitourinary: Negative.      Physical Exam There were no vitals taken for this visit.   Physical Exam my exam from the last visit with no essential changes Ortho Exam She has tenderness down her entire lumbar spine starting in the cervical region including thoracic region but it really gets intense in the lumbar region including the left and right lateral musculature. The lower lumbar levels 45 and L5-S1 seem to be the most tender. She has a positive straight leg raise on the left negative on the right but a positive Lasegue's sign on the right.   She has decreased sensation L4, L5, and S1 and both lower extremities.   Back range of motion is severely limited she cannot extend she stands in flexion   Fortunately her knees and hips are stable right and left. She has mild weakness in her dorsiflexors.   Her skin shows some mild bruising around the knee area is frequent falls. She has normal distal pulses and temperature without edema sensation as described    I did prescribe her some medications and reorder her physical therapy  Meds ordered this encounter  Medications  . naproxen (NAPROSYN) 500 MG tablet    Sig: Take 1 tablet (500 mg total) by mouth 2 (two) times daily with a meal.    Dispense:  60 tablet    Refill:  2  . cyclobenzaprine (FLEXERIL) 10 MG tablet    Sig: Take 1 tablet (10 mg total) by mouth 2 (two) times daily as needed for muscle spasms.    Dispense:  40 tablet    Refill:  1     Return in 6 weeks

## 2015-10-31 ENCOUNTER — Ambulatory Visit (HOSPITAL_COMMUNITY): Payer: Medicaid Other | Attending: Orthopedic Surgery

## 2015-10-31 ENCOUNTER — Encounter (HOSPITAL_COMMUNITY): Payer: Self-pay

## 2015-10-31 DIAGNOSIS — M6281 Muscle weakness (generalized): Secondary | ICD-10-CM | POA: Insufficient documentation

## 2015-10-31 DIAGNOSIS — M545 Low back pain: Secondary | ICD-10-CM | POA: Diagnosis not present

## 2015-10-31 DIAGNOSIS — M546 Pain in thoracic spine: Secondary | ICD-10-CM | POA: Insufficient documentation

## 2015-10-31 DIAGNOSIS — G8929 Other chronic pain: Secondary | ICD-10-CM | POA: Insufficient documentation

## 2015-10-31 NOTE — Therapy (Signed)
[place], [place], [address] Phone: [phone]   Fax:  [phone]  Physical Therapy Evaluation  Patient Details  Name: Felicia Reynolds MRN: [id number] Date of Birth: [birth year] Referring Provider: Arther Abbott   Encounter Date: 10/31/2015      PT End of Session - 10/31/15 1350    Visit Number 1   Number of Visits 1   Date for PT Re-Evaluation --  None: medicaid patients are covered for only 1 visit   Authorization Type Medicaid   Authorization Time Period One time visit 10/31/15-11/01/15   Authorization - Visit Number 1   Authorization - Number of Visits 1   PT Start Time 5681   PT Stop Time 1116   PT Time Calculation (min) 36 min   Equipment Utilized During Treatment Gait belt   Activity Tolerance Patient tolerated treatment well;No increased pain;Patient limited by pain   Behavior During Therapy [facility] for tasks assessed/performed      Past Medical History:  Diagnosis Date  . Arthritis   . Diabetes ([place]) 06/05/2015  . Diabetes mellitus   . Fibromyalgia   . History of abnormal cervical Pap smear 05/29/2015  . History of diabetes mellitus, type II 05/29/2015  . Neuropathy ([place])   . Obesity 05/29/2015  . Osteoporosis   . Superficial fungus infection of skin 05/29/2015  . Vitamin D deficiency 06/05/2015    Past Surgical History:  Procedure Laterality Date  . CESAREAN SECTION    . HERNIA REPAIR    . THROAT SURGERY      There were no vitals filed for this visit.       Subjective Assessment - 10/31/15 1101    Subjective Pt reports she has had back pain for the last 8 years, substantially worse after her 6th pregnancy 3YA. She reports weight loss of 400lbs over the last 10 years, denies surgery, all lost through dietary changes. Pt is referred from ortho, who is recommending some surgical options, but waiting on MRI results.    Pertinent History Hx: RA, OP,    How long can you stand comfortably? 10  minutes   How long can you walk comfortably? <5 minutes   Diagnostic tests Xray, MRI pending    Patient Stated Goals Resolve pain, improve ability to walk.    Currently in Pain? Yes   Pain Score 8    Pain Location --  central spine/lumbar paraspinals             [facility] PT Assessment - 10/31/15 0001      Assessment   Medical Diagnosis Central mid to low back pain   Referring Provider Arther Abbott    Onset Date/Surgical Date --  8 years ago   Prior Therapy None     Precautions   Precautions None     Restrictions   Weight Bearing Restrictions No     Balance Screen   Has the patient fallen in the past 6 months No   Has the patient had a decrease in activity level because of a fear of falling?  Yes   Is the patient reluctant to leave their home because of a fear of falling?  Yes     Prior Function   Level of Independence Independent with household mobility without device;Needs assistance with homemaking     Posture/Postural Control   Posture/Postural Control Postural limitations   Postural Limitations Rounded Shoulders;Forward head;Increased lumbar lordosis;Increased thoracic kyphosis;Anterior pelvic tilt  Flexibility   Soft Tissue Assessment /Muscle Length yes   Hamstrings Psoas tightness and pain bilat: L>R   Quadratus Lumborum spasm and pain all palpable lumbar musculature.      Palpation   Spinal mobility rigid, lordotic lumbar spine  unable to flex hand-to-knees standing.      Ambulation/Gait   Ambulation/Gait Yes   Ambulation Distance (Feet) 125 Feet   Gait velocity 0.28m/s                   [facility] Adult PT Treatment/Exercise - 10/31/15 0001      Exercises   Exercises Lumbar     Lumbar Exercises: Stretches   Single Knee to Chest Stretch 3 reps;60 seconds  bilat    Double Knee to Chest Stretch --  2x25 oscillation     Lumbar Exercises: Supine   Bridge 10 reps  2x10     Lumbar Exercises: Quadruped   Other Quadruped Lumbar  Exercises ADIM c diaphragmatic breathing  10x                PT Education - 10/31/15 1349    Education Details Explained limitations of spastic lumbar spine locked into lordosis. I explained how psoas tightness adn abdominal weakness are playing into this.    Person(s) Educated Patient   Methods Explanation   Comprehension Verbalized understanding          PT Short Term Goals - 10/31/15 1943      PT SHORT TERM GOAL #1   Title Patient will demonstrate independence in HEP to improve lumbar ROM and reduce lumbar spasm.    Status Achieved     PT SHORT TERM GOAL #2   Title After 4 weeks patient will demonstrate psoas lenght of 10 degrees extension bilat to improve postural abnormalisites.    Status New     PT SHORT TERM GOAL #3   Title After 4 weeks patient will demonstrate tolerance of 6MWT without exacerbation of pain.    Status New     PT SHORT TERM GOAL #4   Title After 4 weeks patient will perform all bed mobility at modified independent level.    Status New     PT SHORT TERM GOAL #5   Title After 4 weeks pt will demonstrate lumbar spine ROM WNL without exacerbation of pain.    Status New                  Plan - 10/31/15 1352    Clinical Impression Statement Pt arrives c/o exacerbation of chronic LBP now involving intermittent pain of the thoracic and lower C spine. The patient demonstrates lumbar hyper lordosis (rigid and spastic), and thoracic kyphosis, shortness/weakness in bilat hip flexors, and moderate to severe weakness in abdominals limiting ability to perform simple bed moblity at home. Pt reports impaired sensation in bilat LE from knees to feet, which she attributes to chronic compication from DM.    Rehab Potential Fair   Clinical Impairments Affecting Rehab Potential chronicity of pain, postural dysfunciton, and low activity tolerance level.    PT Frequency One time visit   PT Treatment/Interventions ADLs/Self Care Home Management;Therapeutic  exercise;Therapeutic activities;Patient/family education;Passive range of motion   PT Home Exercise Plan SKTc, DKTC, Bridge, Quadruped straight arm raise, Quaddruped cervical retractioin.    Consulted and Agree with Plan of Care Patient      Patient will benefit from skilled therapeutic intervention in order to improve the following deficits and impairments:  Abnormal gait, Decreased  coordination, Decreased range of motion, Difficulty walking, Increased fascial restricitons, Obesity, Pain, Decreased activity tolerance, Decreased balance, Decreased mobility, Decreased strength, Increased edema, Impaired sensation  Visit Diagnosis: Chronic midline low back pain, with sciatica presence unspecified - Plan: PT plan of care cert/re-cert  Pain in thoracic spine - Plan: PT plan of care cert/re-cert  Muscle weakness (generalized) - Plan: PT plan of care cert/re-cert     Problem List Patient Active Problem List   Diagnosis Date Noted  . Diabetes ([place]) 06/05/2015  . Vitamin D deficiency 06/05/2015  . Superficial fungus infection of skin 05/29/2015  . Obesity 05/29/2015  . History of diabetes mellitus, type II 05/29/2015  . History of abnormal cervical Pap smear 05/29/2015    7:54 PM, 10/31/15 Etta Grandchild, PT, DPT Physical Therapist at [facility] [phone] (office)     [place], [place], [address] Phone: [phone]   Fax:  [phone]  Name: Felicia Reynolds MRN: [id number] Date of Birth: Jul 25, 1980

## 2015-12-02 ENCOUNTER — Encounter: Payer: Self-pay | Admitting: Orthopedic Surgery

## 2015-12-02 ENCOUNTER — Telehealth: Payer: Self-pay | Admitting: Orthopedic Surgery

## 2015-12-02 NOTE — Telephone Encounter (Signed)
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  She said that her back hurt right much after doing this.  She wants a note stating that she can sit down while ringing the bell.  Is this okay?

## 2015-12-02 NOTE — Telephone Encounter (Signed)
YES

## 2015-12-05 ENCOUNTER — Other Ambulatory Visit: Payer: Self-pay | Admitting: Adult Health

## 2015-12-15 ENCOUNTER — Other Ambulatory Visit: Payer: Self-pay | Admitting: Physical Medicine and Rehabilitation

## 2015-12-16 ENCOUNTER — Other Ambulatory Visit: Payer: Self-pay | Admitting: Neurology

## 2015-12-17 ENCOUNTER — Other Ambulatory Visit: Payer: Self-pay | Admitting: Neurology

## 2015-12-17 DIAGNOSIS — M4800 Spinal stenosis, site unspecified: Secondary | ICD-10-CM

## 2015-12-22 ENCOUNTER — Ambulatory Visit (INDEPENDENT_AMBULATORY_CARE_PROVIDER_SITE_OTHER): Payer: Medicaid Other | Admitting: Orthopedic Surgery

## 2015-12-22 DIAGNOSIS — M549 Dorsalgia, unspecified: Secondary | ICD-10-CM | POA: Diagnosis not present

## 2015-12-22 NOTE — Patient Instructions (Signed)
Call the office after MRI January 10 to schedule appointment

## 2015-12-22 NOTE — Progress Notes (Signed)
Patient ID: Felicia Reynolds, female   DOB: 1980/08/08, 35 y.o.   MRN: [id number]  Chief Complaint  Patient presents with  . Follow-up    Recheck on back after PT.    HPI Felicia Reynolds is a 35 y.o. female.   HPI  35 year old female with continuous bilateral lower extremity pain associated with back pain  She had one visit to PT per Medicaid allowance did not help she is already on Lyrica and Lantus and insulin for her diabetes  She complains of upset stomach from recent medication by neurologist.  She does smoke  Review of Systems Review of Systems  Loss of balance bilateral leg numbness feet numb   Physical Exam   Physical Exam  She ambulates with a slightly flexed posture. She has decreased sensation to palpation in both lower extremities from her knees to her feet somewhat stocking distribution. She has normal strength in both legs both knees are stable skin is normal pulses are good knee and ankle range of motion is normal  She has lower back tenderness  Encounter Diagnosis  Name Primary?  . Back pain with radiation Yes    Plan follow-up after MRI in January call Dr. at neurology to check on medication reaction.

## 2016-01-14 ENCOUNTER — Ambulatory Visit (HOSPITAL_COMMUNITY): Admission: RE | Admit: 2016-01-14 | Payer: Medicaid Other | Source: Ambulatory Visit

## 2016-02-10 ENCOUNTER — Ambulatory Visit (HOSPITAL_COMMUNITY): Payer: Medicaid Other

## 2016-02-17 ENCOUNTER — Encounter: Payer: Self-pay | Admitting: Adult Health

## 2016-02-17 ENCOUNTER — Ambulatory Visit (INDEPENDENT_AMBULATORY_CARE_PROVIDER_SITE_OTHER): Payer: Medicaid Other | Admitting: Adult Health

## 2016-02-17 VITALS — BP 158/90 | HR 101 | Ht 66.5 in | Wt 240.0 lb

## 2016-02-17 DIAGNOSIS — N926 Irregular menstruation, unspecified: Secondary | ICD-10-CM | POA: Diagnosis not present

## 2016-02-17 DIAGNOSIS — Z3202 Encounter for pregnancy test, result negative: Secondary | ICD-10-CM | POA: Diagnosis not present

## 2016-02-17 DIAGNOSIS — B369 Superficial mycosis, unspecified: Secondary | ICD-10-CM

## 2016-02-17 DIAGNOSIS — R1031 Right lower quadrant pain: Secondary | ICD-10-CM

## 2016-02-17 LAB — POCT URINE PREGNANCY: Preg Test, Ur: NEGATIVE

## 2016-02-17 NOTE — Progress Notes (Signed)
Subjective:     Patient ID: Felicia Reynolds, female   DOB: September 13, 1980, 36 y.o.   MRN: [id number]  HPI Ronnette is a 36 year old white female,married in complaining of RLQ pain for about 1.5 weeks and ? Skin yeast in clitoris area, (used OTC monistat) and last 2 periods not normal, just spotted brown.  PCP is [facility].  Review of Systems +RLQ pain Periods not normal ?skin yeast  Reviewed past medical,surgical, social and family history. Reviewed medications and allergies.     Objective:   Physical Exam BP (!) 158/90 (BP Location: Left Arm, Patient Position: Sitting, Cuff Size: Normal)   Pulse (!) 101   Ht 5' 6.5" (1.689 m)   Wt 240 lb (108.9 kg)   LMP 12/07/2015 (Exact Date)   BMI 38.16 kg/m PHQ 2 score 0.  UPT negative.  Skin warm and dry.Pelvic: external genitalia is red and swollen, vagina: scant discharge without odor,urethra has no lesions or masses noted, cervix:smooth and bulbous, uterus: normal size, shape and contour, non tender, no masses felt, adnexa: no masses, RLQ  tenderness noted. Bladder is non tender and no masses felt. Painted vulva and clitoris area with gentian violet. GC/CHL obtained.     Assessment:     1. RLQ abdominal pain   2. Superficial fungus infection of skin   3. Abnormal menstrual periods   4. Negative pregnancy test       Plan:     GC/CHL sent Keep area clean and dry Return in 3 days for GYN [place], will talk when results back

## 2016-02-19 LAB — GC/CHLAMYDIA PROBE AMP
CHLAMYDIA, DNA PROBE: NEGATIVE
Neisseria gonorrhoeae by PCR: NEGATIVE

## 2016-02-20 ENCOUNTER — Encounter: Payer: Self-pay | Admitting: Adult Health

## 2016-02-20 ENCOUNTER — Ambulatory Visit (INDEPENDENT_AMBULATORY_CARE_PROVIDER_SITE_OTHER): Payer: Medicaid Other

## 2016-02-20 ENCOUNTER — Telehealth: Payer: Self-pay | Admitting: Adult Health

## 2016-02-20 DIAGNOSIS — N854 Malposition of uterus: Secondary | ICD-10-CM | POA: Diagnosis not present

## 2016-02-20 DIAGNOSIS — R1031 Right lower quadrant pain: Secondary | ICD-10-CM | POA: Diagnosis not present

## 2016-02-20 DIAGNOSIS — N83202 Unspecified ovarian cyst, left side: Secondary | ICD-10-CM

## 2016-02-20 DIAGNOSIS — N83209 Unspecified ovarian cyst, unspecified side: Secondary | ICD-10-CM

## 2016-02-20 DIAGNOSIS — N83291 Other ovarian cyst, right side: Secondary | ICD-10-CM | POA: Diagnosis not present

## 2016-02-20 HISTORY — DX: Unspecified ovarian cyst, unspecified side: N83.209

## 2016-02-20 NOTE — Telephone Encounter (Signed)
Pt aware US showed simple cyst rt ovary

## 2016-02-20 NOTE — Progress Notes (Signed)
PELVIC US TA/TV: homogeneous anteverted uterus,wnl,EEC 13.4 mm,normal left ovary,simple right ovarian cyst 3.6 x 2.4 x 3 cm,no free fluid,no pain during ultrasound,ovaries appear mobile

## 2016-03-11 ENCOUNTER — Telehealth: Payer: Self-pay | Admitting: Orthopedic Surgery

## 2016-03-11 NOTE — Telephone Encounter (Signed)
Call received from patient regarding back brace/"abdominal binder" written by Dr Aline Brochure at patient's office visit 10/24/15.  States that [place] indicates that she will need an order, in addition to the prescription. Relayed that Dr is out of office, and no clinical staff at this time. * *Per [employer], [phone], Tammy, one of the fitter, states will advise patient that this type of brace is available there at cost of $37.99, and is not covered by insurance; therefore, patient may come back in and speak with herself, Tammy, or Doris.  I also called back to patient, 03/11/16, and relayed.

## 2016-03-12 LAB — HEMOGLOBIN A1C: HEMOGLOBIN A1C: 14

## 2016-03-18 ENCOUNTER — Ambulatory Visit (HOSPITAL_COMMUNITY): Payer: Medicaid Other

## 2016-03-26 ENCOUNTER — Ambulatory Visit (HOSPITAL_COMMUNITY)
Admission: RE | Admit: 2016-03-26 | Discharge: 2016-03-26 | Disposition: A | Discharge status: Home / Self Care | Payer: Medicaid Other | Source: Ambulatory Visit | Attending: Neurology | Admitting: Neurology

## 2016-03-26 DIAGNOSIS — M48061 Spinal stenosis, lumbar region without neurogenic claudication: Secondary | ICD-10-CM | POA: Insufficient documentation

## 2016-03-26 DIAGNOSIS — G9589 Other specified diseases of spinal cord: Secondary | ICD-10-CM | POA: Diagnosis not present

## 2016-03-26 DIAGNOSIS — M5126 Other intervertebral disc displacement, lumbar region: Secondary | ICD-10-CM | POA: Diagnosis not present

## 2016-03-26 DIAGNOSIS — M2578 Osteophyte, vertebrae: Secondary | ICD-10-CM | POA: Diagnosis not present

## 2016-03-26 DIAGNOSIS — M549 Dorsalgia, unspecified: Secondary | ICD-10-CM | POA: Diagnosis present

## 2016-03-26 DIAGNOSIS — M4800 Spinal stenosis, site unspecified: Secondary | ICD-10-CM

## 2016-04-20 ENCOUNTER — Telehealth: Payer: Self-pay | Admitting: Adult Health

## 2016-04-20 MED ORDER — TERCONAZOLE 0.4 % VA CREA: 1.0000 | TOPICAL_CREAM | Freq: Every day | VAGINAL | 0 refills | Status: DC

## 2016-04-20 NOTE — Telephone Encounter (Signed)
Pt complains of yeast infection, has used diflucan, will rx Terazol

## 2016-05-05 ENCOUNTER — Ambulatory Visit (INDEPENDENT_AMBULATORY_CARE_PROVIDER_SITE_OTHER): Payer: Medicaid Other | Admitting: "Endocrinology

## 2016-05-05 VITALS — BP 134/84 | HR 105 | Ht 66.5 in | Wt 240.0 lb

## 2016-05-05 DIAGNOSIS — E1165 Type 2 diabetes mellitus with hyperglycemia: Secondary | ICD-10-CM | POA: Diagnosis not present

## 2016-05-05 DIAGNOSIS — Z9119 Patient's noncompliance with other medical treatment and regimen: Secondary | ICD-10-CM | POA: Insufficient documentation

## 2016-05-05 DIAGNOSIS — E118 Type 2 diabetes mellitus with unspecified complications: Secondary | ICD-10-CM

## 2016-05-05 DIAGNOSIS — Z91199 Patient's noncompliance with other medical treatment and regimen due to unspecified reason: Secondary | ICD-10-CM | POA: Insufficient documentation

## 2016-05-05 DIAGNOSIS — I10 Essential (primary) hypertension: Secondary | ICD-10-CM

## 2016-05-05 DIAGNOSIS — E669 Obesity, unspecified: Secondary | ICD-10-CM | POA: Diagnosis not present

## 2016-05-05 LAB — GLUCOSE, POCT (MANUAL RESULT ENTRY): POC Glucose: 547 mg/dl — AB (ref 70–99)

## 2016-05-05 NOTE — Progress Notes (Signed)
Subjective:    Patient ID: Felicia Reynolds, female    DOB: Oct 27, 1980. Patient is being seen in consultation for management of diabetes requested by  [facility], MD  Past Medical History:  Diagnosis Date  . Arthritis   . Diabetes ([place]) 06/05/2015  . Diabetes mellitus   . Fibromyalgia   . History of abnormal cervical Pap smear 05/29/2015  . History of diabetes mellitus, type II 05/29/2015  . Neuropathy   . Obesity 05/29/2015  . Osteoporosis   . Ovarian cyst 02/20/2016  . Ovarian cyst 02/20/2016   Simple cyst rt ovary  . Superficial fungus infection of skin 05/29/2015  . Vitamin D deficiency 06/05/2015   Past Surgical History:  Procedure Laterality Date  . CESAREAN SECTION    . HERNIA REPAIR    . THROAT SURGERY     Social History   Social History  . Marital status: Divorced    Spouse name: N/A  . Number of children: N/A  . Years of education: N/A   Social History Main Topics  . Smoking status: Current Every Day Smoker    Packs/day: 1.00    Years: 13.00    Types: Cigarettes    Last attempt to quit: 10/31/2011  . Smokeless tobacco: Never Used  . Alcohol use No  . Drug use: No  . Sexual activity: Yes    Birth control/ protection: None   Other Topics Concern  . None   Social History Narrative  . None   Outpatient Encounter Prescriptions as of 05/05/2016  Medication Sig  . CINNAMON PO Take by mouth.  . cyclobenzaprine (FLEXERIL) 10 MG tablet Take 10 mg by mouth 3 (three) times daily as needed for muscle spasms.  Marland Kitchen HYDROcodone-acetaminophen (NORCO) 7.5-325 MG tablet Take 1 tablet by mouth every 6 (six) hours as needed for moderate pain.  Marland Kitchen lisinopril (PRINIVIL,ZESTRIL) 20 MG tablet Take 20 mg by mouth daily.  Marland Kitchen lubiprostone (AMITIZA) 24 MCG capsule Take 24 mcg by mouth 2 (two) times daily with a meal.  . traMADol (ULTRAM) 50 MG tablet Take by mouth 3 (three) times daily.  Marland Kitchen UNABLE TO FIND Med Name: Tumeric  . LANTUS SOLOSTAR 100 UNIT/ML Solostar Pen Inject 60 Units  into the skin at bedtime.  . pregabalin (LYRICA) 150 MG capsule Take 150 mg by mouth 3 (three) times daily.  . [DISCONTINUED] Cholecalciferol 5000 units capsule Take 1 capsule (5,000 Units total) by mouth daily. (Patient not taking: Reported on 02/17/2016)  . [DISCONTINUED] HYDROcodone-acetaminophen (NORCO) 7.5-325 MG tablet Take 1 tablet by mouth 3 (three) times daily as needed for moderate pain.  . [DISCONTINUED] insulin regular (NOVOLIN R,HUMULIN R) 100 units/mL injection Inject into the skin. According to sliding scale once daily  . [DISCONTINUED] metFORMIN (GLUCOPHAGE) 1000 MG tablet Take 1 tablet (1,000 mg total) by mouth 2 (two) times daily with a meal.  . [DISCONTINUED] naproxen (NAPROSYN) 500 MG tablet Take 1 tablet (500 mg total) by mouth 2 (two) times daily with a meal. (Patient not taking: Reported on 02/17/2016)  . [DISCONTINUED] polyethylene glycol (MIRALAX / GLYCOLAX) packet Take 17 g by mouth daily.  . [DISCONTINUED] simvastatin (ZOCOR) 20 MG tablet Take 1 tablet (20 mg total) by mouth daily. (Patient not taking: Reported on 02/17/2016)  . [DISCONTINUED] terconazole (TERAZOL 7) 0.4 % vaginal cream Place 1 applicator vaginally at bedtime.   No facility-administered encounter medications on file as of 05/05/2016.    ALLERGIES: Allergies  Allergen Reactions  . Latex Itching and Rash  VACCINATION STATUS: Immunization History  Administered Date(s) Administered  . Influenza [place] 11/29/2011    Diabetes  She presents for her initial diabetic visit. She has type 2 diabetes mellitus. Onset time: She was diagnosed at approximate age of 21 years. Her disease course has been worsening (She saw me in 2013 with uncontrolled diabetes, disappeared from care,. Now being rereferred with A1c greater than 14%.). There are no hypoglycemic associated symptoms. Pertinent negatives for hypoglycemia include no confusion, headaches, pallor or seizures. Associated symptoms include blurred vision, fatigue,  polydipsia and polyuria. Pertinent negatives for diabetes include no chest pain and no polyphagia. There are no hypoglycemic complications. Symptoms are worsening. Diabetic complications include peripheral neuropathy. Risk factors for coronary artery disease include diabetes mellitus, obesity, sedentary lifestyle and tobacco exposure (Patient is alarmingly noncompliant.). Current diabetic treatments: She is taking Lantus 60 units twice a day. She is compliant with treatment none of the time. Her weight is decreasing steadily. She is following a generally unhealthy diet. When asked about meal planning, she reported none. She has not had a previous visit with a dietitian. She never participates in exercise. There is no compliance (She came with no meter nor logs to review today.) with monitoring of blood glucose. An ACE inhibitor/angiotensin II receptor blocker is being taken.  Hypertension  This is a chronic problem. The current episode started more than 1 year ago. The problem is controlled. Associated symptoms include blurred vision. Pertinent negatives include no chest pain, headaches, palpitations or shortness of breath. Risk factors for coronary artery disease include diabetes mellitus, obesity, sedentary lifestyle and smoking/tobacco exposure. Past treatments include ACE inhibitors. Compliance problems include diet and psychosocial issues.        Review of Systems  Constitutional: Positive for fatigue. Negative for chills, fever and unexpected weight change.  HENT: Negative for trouble swallowing and voice change.   Eyes: Positive for blurred vision. Negative for visual disturbance.  Respiratory: Negative for cough, shortness of breath and wheezing.   Cardiovascular: Negative for chest pain, palpitations and leg swelling.  Gastrointestinal: Negative for diarrhea, nausea and vomiting.  Endocrine: Positive for polydipsia and polyuria. Negative for cold intolerance, heat intolerance and polyphagia.   Musculoskeletal: Positive for back pain and gait problem. Negative for arthralgias and myalgias.  Skin: Negative for color change, pallor, rash and wound.  Neurological: Negative for seizures and headaches.  Psychiatric/Behavioral: Negative for confusion and suicidal ideas.    Objective:    BP 134/84   Pulse (!) 105   Ht 5' 6.5" (1.689 m)   Wt 240 lb (108.9 kg)   BMI 38.16 kg/m   Wt Readings from Last 3 Encounters:  05/05/16 240 lb (108.9 kg)  02/17/16 240 lb (108.9 kg)  08/11/15 237 lb (107.5 kg)    Physical Exam  Constitutional: She is oriented to person, place, and time. She appears well-developed.  HENT:  Head: Normocephalic and atraumatic.  Eyes: EOM are normal.  Neck: Normal range of motion. Neck supple. No tracheal deviation present. No thyromegaly present.  Cardiovascular: Normal rate and regular rhythm.   Pulmonary/Chest: Effort normal and breath sounds normal.  Abdominal: Soft. Bowel sounds are normal. There is no tenderness. There is no guarding.  Musculoskeletal: Normal range of motion. She exhibits no edema.  Neurological: She is alert and oriented to person, place, and time. She has normal reflexes. No cranial nerve deficit. Coordination normal.  Skin: Skin is warm and dry. No rash noted. No erythema. No pallor.  Psychiatric:  Reluctant, unconcerned affect.  CMP     Component Value Date/Time   NA 137 05/29/2015 1201   K 4.8 05/29/2015 1201   CL 95 (L) 05/29/2015 1201   CO2 23 05/29/2015 1201   GLUCOSE 385 (H) 05/29/2015 1201   GLUCOSE 301 (H) 05/13/2014 1431   BUN 12 05/29/2015 1201   CREATININE 0.71 05/29/2015 1201   CALCIUM 9.9 05/29/2015 1201   PROT 7.2 05/29/2015 1201   ALBUMIN 4.0 05/29/2015 1201   AST 9 05/29/2015 1201   ALT 6 05/29/2015 1201   ALKPHOS 89 05/29/2015 1201   BILITOT 0.3 05/29/2015 1201   GFRNONAA 109 05/29/2015 1201   GFRAA 126 05/29/2015 1201     Diabetic Labs (most recent): Lab Results  Component Value Date    HGBA1C 14 03/12/2016   HGBA1C 12.5 (H) 05/29/2015     Lipid Panel ( most recent) Lipid Panel     Component Value Date/Time   CHOL 166 05/29/2015 1201   TRIG 162 (H) 05/29/2015 1201   HDL 32 (L) 05/29/2015 1201   CHOLHDL 5.2 (H) 05/29/2015 1201   LDLCALC 102 (H) 05/29/2015 1201      Assessment & Plan:   1. Uncontrolled type 2 diabetes mellitus with complication, unspecified whether long term insulin use ([place])   - Patient has currently uncontrolled symptomatic type 2 DM since  36 years of age,  with most recent A1c of > 14 %. Recent labs reviewed.   Her diabetes is complicated by peripheral neuropathy, noncompliance/nonadherence, sedentary life/obesity and patient remains at a high risk for more acute and chronic complications of diabetes which include CAD, CVA, CKD, retinopathy, and neuropathy. These are all discussed in detail with the patient.  - I have counseled the patient on diet management and weight loss, by adopting a carbohydrate restricted/protein rich diet.  - Suggestion is made for patient to avoid simple carbohydrates   from their diet including Cakes , Desserts, Ice Cream,  Soda (  diet and regular) , Sweet Tea , Candies,  Chips, Cookies, Artificial Sweeteners,   and "Sugar-free" Products . This will help patient to have stable blood glucose profile and potentially avoid unintended weight gain.  - I encouraged the patient to switch to  unprocessed or minimally processed complex starch and increased protein intake (animal or plant source), fruits, and vegetables.  - Patient is advised to stick to a routine mealtimes to eat 3 meals  a day and avoid unnecessary snacks ( to snack only to correct hypoglycemia).  - The patient will be scheduled with Jearld Fenton, RDN, CDE for individualized DM education.  - I have approached patient with the following individualized plan to manage diabetes and patient agrees:   -   This patient has been seen by me in the past, the  separate from caring for unclear reasons. -Given her current glycemic burden with > 14% A1c , she will likely require basal/bolus insulin treatment to achieve better control of diabetes. -However, it is essential to assure her commitment for proper monitoring for safe use of insulin.  - I  approached her to initiate strict monitoring of blood glucose 4 times a day-before meals and at bedtime and return in one week with her meter and logs for reevaluation. - In the meantime I will proceed to readjust her basal insulin Lantus to 60 units daily at bedtime.  -Patient is encouraged to call clinic for blood glucose levels less than 70 or above 300 mg /dl. - She may qualify and benefit from metformin,  incretin,  as well as SGLT2 inhibitor therapy.  - Patient specific target  A1c;  LDL, HDL, Triglycerides, and  Waist Circumference were discussed in detail.  2) BP/HTN: Controlled . Continue current medications including ACEI/ARB. 3) Lipids/HPL:   Lipid panel unknown.   she is not on statins, will obtain fasting lipid panel on subsequent visits. Marland Kitchen 4)  Weight/Diet: CDE Consult will be initiated , exercise, and detailed carbohydrates information provided.  5) Chronic Care/Health Maintenance:  -Patient is on ACEI/ARB medications and encouraged to continue to follow up with Ophthalmology, Podiatrist at least yearly or according to recommendations, and advised to  quit smoking. I have recommended yearly flu vaccine and pneumonia vaccination at least every 5 years; moderate intensity exercise for up to 150 minutes weekly; and  sleep for at least 7 hours a day.  - 60 minutes of time was spent on the care of this patient , 50% of which was applied for counseling on diabetes complications and their preventions.  - Patient to bring meter and  blood glucose logs during her next visit.   - I advised patient to maintain close follow up with [facility], MD for primary care needs.  Follow up plan: - Return in  about 1 week (around 05/12/2016) for follow up with meter and logs- no labs.  Glade Lloyd, MD Phone: [phone]  Fax: [phone]   05/05/2016, 3:00 PM

## 2016-05-05 NOTE — Patient Instructions (Signed)

## 2016-05-13 ENCOUNTER — Encounter: Payer: Self-pay | Admitting: "Endocrinology

## 2016-05-13 ENCOUNTER — Ambulatory Visit: Payer: Medicaid Other | Admitting: "Endocrinology

## 2016-05-24 ENCOUNTER — Other Ambulatory Visit: Payer: Self-pay | Admitting: Orthopedic Surgery

## 2016-06-01 ENCOUNTER — Telehealth: Payer: Self-pay | Admitting: Obstetrics & Gynecology

## 2016-06-01 NOTE — Telephone Encounter (Signed)
Pt called stating that she would like for Dr. Elonda Husky to call in refills for her insulin. Pt states that [facility] normally does it but since she is out on vacation pt would like for Dr. Elonda Husky to do it for her. Please contact pt

## 2016-06-02 ENCOUNTER — Other Ambulatory Visit: Payer: Self-pay | Admitting: Obstetrics & Gynecology

## 2016-06-02 MED ORDER — LANTUS SOLOSTAR 100 UNIT/ML ~~LOC~~ SOPN: 60.0000 [IU] | PEN_INJECTOR | Freq: Every day | SUBCUTANEOUS | 3 refills | Status: DC

## 2016-06-02 NOTE — Telephone Encounter (Signed)
Done 06/02/2016 7:17 AM

## 2016-06-02 NOTE — Telephone Encounter (Signed)
LMOVM that prescription was refilled and sent to pharmacy.

## 2016-06-03 ENCOUNTER — Telehealth: Payer: Self-pay | Admitting: Nutrition

## 2016-06-03 ENCOUNTER — Ambulatory Visit: Payer: Medicaid Other | Admitting: Nutrition

## 2016-06-03 NOTE — Telephone Encounter (Signed)
TC to pt to reschedule missed appt. She notes transporation didn't pick her up but didn't call to reschedule or cancel appointment. Phone conversation was cut outt.  Left VM to return call to reschedul appt with me and Dr. Dorris Fetch.Marland Kitchen

## 2016-06-14 ENCOUNTER — Other Ambulatory Visit: Payer: Self-pay

## 2016-06-14 ENCOUNTER — Encounter (HOSPITAL_COMMUNITY): Payer: Self-pay | Admitting: Emergency Medicine

## 2016-06-14 ENCOUNTER — Emergency Department (HOSPITAL_COMMUNITY): Payer: Medicaid Other

## 2016-06-14 ENCOUNTER — Emergency Department (HOSPITAL_COMMUNITY)
Admission: EM | Admit: 2016-06-14 | Discharge: 2016-06-14 | Disposition: A | Discharge status: Home / Self Care | Payer: Medicaid Other | Attending: Emergency Medicine | Admitting: Emergency Medicine

## 2016-06-14 DIAGNOSIS — F17210 Nicotine dependence, cigarettes, uncomplicated: Secondary | ICD-10-CM | POA: Insufficient documentation

## 2016-06-14 DIAGNOSIS — Z79899 Other long term (current) drug therapy: Secondary | ICD-10-CM | POA: Insufficient documentation

## 2016-06-14 DIAGNOSIS — E1165 Type 2 diabetes mellitus with hyperglycemia: Secondary | ICD-10-CM | POA: Insufficient documentation

## 2016-06-14 DIAGNOSIS — Z794 Long term (current) use of insulin: Secondary | ICD-10-CM | POA: Insufficient documentation

## 2016-06-14 DIAGNOSIS — J209 Acute bronchitis, unspecified: Secondary | ICD-10-CM | POA: Insufficient documentation

## 2016-06-14 DIAGNOSIS — J40 Bronchitis, not specified as acute or chronic: Secondary | ICD-10-CM

## 2016-06-14 DIAGNOSIS — I10 Essential (primary) hypertension: Secondary | ICD-10-CM | POA: Insufficient documentation

## 2016-06-14 DIAGNOSIS — Z7982 Long term (current) use of aspirin: Secondary | ICD-10-CM | POA: Insufficient documentation

## 2016-06-14 DIAGNOSIS — R05 Cough: Secondary | ICD-10-CM | POA: Diagnosis present

## 2016-06-14 LAB — POCT I-STAT, CHEM 8
BUN: 7 mg/dL (ref 6–20)
CALCIUM ION: 1.21 mmol/L (ref 1.15–1.40)
Chloride: 82 mmol/L — ABNORMAL LOW (ref 101–111)
Creatinine, Ser: 0.7 mg/dL (ref 0.44–1.00)
GLUCOSE: 447 mg/dL — AB (ref 65–99)
HCT: 54 % — ABNORMAL HIGH (ref 36.0–46.0)
Hemoglobin: 18.4 g/dL — ABNORMAL HIGH (ref 12.0–15.0)
Potassium: 4.4 mmol/L (ref 3.5–5.1)
SODIUM: 178 mmol/L — AB (ref 135–145)
TCO2: 27 mmol/L (ref 0–100)

## 2016-06-14 LAB — CBC WITH DIFFERENTIAL/PLATELET
BASOS ABS: 0 10*3/uL (ref 0.0–0.1)
BASOS PCT: 0 %
EOS PCT: 0 %
Eosinophils Absolute: 0 10*3/uL (ref 0.0–0.7)
HEMATOCRIT: 42.1 % (ref 36.0–46.0)
Hemoglobin: 14.5 g/dL (ref 12.0–15.0)
Lymphocytes Relative: 18 %
Lymphs Abs: 2.5 10*3/uL (ref 0.7–4.0)
MCH: 29 pg (ref 26.0–34.0)
MCHC: 34.4 g/dL (ref 30.0–36.0)
MCV: 84.2 fL (ref 78.0–100.0)
MONO ABS: 1 10*3/uL (ref 0.1–1.0)
MONOS PCT: 7 %
NEUTROS ABS: 10.7 10*3/uL — AB (ref 1.7–7.7)
Neutrophils Relative %: 75 %
PLATELETS: 201 10*3/uL (ref 150–400)
RBC: 5 MIL/uL (ref 3.87–5.11)
RDW: 12.6 % (ref 11.5–15.5)
WBC: 14.2 10*3/uL — ABNORMAL HIGH (ref 4.0–10.5)

## 2016-06-14 LAB — BASIC METABOLIC PANEL
Anion gap: 9 (ref 5–15)
BUN: 9 mg/dL (ref 6–20)
CALCIUM: 9.2 mg/dL (ref 8.9–10.3)
CO2: 28 mmol/L (ref 22–32)
CREATININE: 0.7 mg/dL (ref 0.44–1.00)
Chloride: 96 mmol/L — ABNORMAL LOW (ref 101–111)
GLUCOSE: 462 mg/dL — AB (ref 65–99)
Potassium: 4.3 mmol/L (ref 3.5–5.1)
Sodium: 133 mmol/L — ABNORMAL LOW (ref 135–145)

## 2016-06-14 LAB — D-DIMER, QUANTITATIVE: D-Dimer, Quant: <0.27 ug/mL-FEU (ref 0.00–0.50)

## 2016-06-14 MED ORDER — ALBUTEROL SULFATE HFA 108 (90 BASE) MCG/ACT IN AERS
4.0000 | INHALATION_SPRAY | Freq: Once | RESPIRATORY_TRACT | Status: AC
  Administered 2016-06-14: 4 via RESPIRATORY_TRACT
  Filled 2016-06-14: qty 6.7

## 2016-06-14 MED ORDER — PREDNISONE 50 MG PO TABS
60.0000 mg | ORAL_TABLET | Freq: Once | ORAL | Status: AC
  Administered 2016-06-14: 20:00:00 60 mg via ORAL
  Filled 2016-06-14: qty 1

## 2016-06-14 MED ORDER — HYDROCODONE-HOMATROPINE 5-1.5 MG/5ML PO SYRP
5.0000 mL | ORAL_SOLUTION | Freq: Once | ORAL | Status: AC
  Administered 2016-06-14: 5 mL via ORAL
  Filled 2016-06-14: qty 5

## 2016-06-14 MED ORDER — AZITHROMYCIN 250 MG PO TABS: 250.0000 mg | ORAL_TABLET | Freq: Every day | ORAL | 0 refills | Status: DC

## 2016-06-14 MED ORDER — PREDNISONE 20 MG PO TABS: ORAL_TABLET | ORAL | 0 refills | Status: DC

## 2016-06-14 MED ORDER — HYDROCODONE-HOMATROPINE 5-1.5 MG/5ML PO SYRP: 5.0000 mL | ORAL_SOLUTION | Freq: Four times a day (QID) | ORAL | 0 refills | Status: DC | PRN

## 2016-06-14 MED ORDER — IPRATROPIUM-ALBUTEROL 0.5-2.5 (3) MG/3ML IN SOLN
3.0000 mL | RESPIRATORY_TRACT | Status: AC
  Administered 2016-06-14 (×3): 3 mL via RESPIRATORY_TRACT
  Filled 2016-06-14: qty 9

## 2016-06-14 MED ORDER — LACTATED RINGERS IV BOLUS (SEPSIS)
1000.0000 mL | Freq: Once | INTRAVENOUS | Status: AC
  Administered 2016-06-14: 1000 mL via INTRAVENOUS

## 2016-06-14 MED ORDER — DEXTROSE 5 % IV SOLN
500.0000 mg | Freq: Once | INTRAVENOUS | Status: AC
  Administered 2016-06-14: 500 mg via INTRAVENOUS
  Filled 2016-06-14: qty 500

## 2016-06-14 NOTE — ED Triage Notes (Signed)
PT c/o productive green sputum cough x3 days and pain with inhalation and cough. PT states body aches and cold chills as well.

## 2016-06-14 NOTE — ED Provider Notes (Signed)
[facility] DEPT Provider Note   CSN: [id number] Arrival date & time: 06/14/16  1842     History   Chief Complaint Chief Complaint  Patient presents with  . Cough    HPI Felicia Reynolds is a 36 y.o. female.   Cough  This is a new problem. The current episode started more than 2 days ago. The problem occurs constantly. The problem has been gradually worsening. The cough is productive of sputum. There has been no fever. Associated symptoms include chest pain, chills, ear pain, rhinorrhea, sore throat, shortness of breath and wheezing. She has tried decongestants for the symptoms. Her past medical history is significant for asthma.    Past Medical History:  Diagnosis Date  . Arthritis   . Diabetes ([place]) 06/05/2015  . Diabetes mellitus   . Fibromyalgia   . History of abnormal cervical Pap smear 05/29/2015  . History of diabetes mellitus, type II 05/29/2015  . Neuropathy   . Obesity 05/29/2015  . Osteoporosis   . Ovarian cyst 02/20/2016  . Ovarian cyst 02/20/2016   Simple cyst rt ovary  . Superficial fungus infection of skin 05/29/2015  . Vitamin D deficiency 06/05/2015    Patient Active Problem List   Diagnosis Date Noted  . Uncontrolled type 2 diabetes mellitus with complication ([place]) 81/85/6314  . Personal history of noncompliance with medical treatment, presenting hazards to health 05/05/2016  . Essential hypertension, benign 05/05/2016  . Ovarian cyst 02/20/2016  . Diabetes ([place]) 06/05/2015  . Vitamin D deficiency 06/05/2015  . Superficial fungus infection of skin 05/29/2015  . Morbid obesity ([place]) 05/29/2015  . History of diabetes mellitus, type II 05/29/2015  . History of abnormal cervical Pap smear 05/29/2015    Past Surgical History:  Procedure Laterality Date  . CESAREAN SECTION    . HERNIA REPAIR    . THROAT SURGERY      OB History    Gravida Para Term Preterm AB Living   6 1 0 0 5 0   SAB TAB Ectopic Multiple Live Births   4 0 1 0         Home  Medications    Prior to Admission medications   Medication Sig Start Date End Date Taking? Authorizing Provider  acetaminophen (RA FEVER REDUCER/PAIN RELIEVER) 160 MG/5ML suspension Take 320 mg by mouth every 6 (six) hours as needed.   Yes [provider]  Cinnamon 500 MG capsule Take 1,000 mg by mouth daily.   Yes [provider]  cyclobenzaprine (FLEXERIL) 10 MG tablet TAKE (1) TABLET BY MOUTH TWICE A DAY AS NEEDED. 05/25/16  Yes Carole Civil, MD  HYDROcodone-acetaminophen (NORCO) 7.5-325 MG tablet Take 1 tablet by mouth every 6 (six) hours as needed for moderate pain.   Yes [provider]  LANTUS SOLOSTAR 100 UNIT/ML Solostar Pen Inject 60 Units into the skin at bedtime. Patient taking differently: Inject 70 Units into the skin at bedtime.  06/02/16  Yes Florian Buff, MD  lisinopril (PRINIVIL,ZESTRIL) 10 MG tablet Take 10 mg by mouth daily.   Yes [provider]  lubiprostone (AMITIZA) 24 MCG capsule Take 24 mcg by mouth 2 (two) times daily with a meal.   Yes [provider]  megestrol (MEGACE) 40 MG tablet Take 40 mg by mouth daily.   Yes [provider]  pregabalin (LYRICA) 150 MG capsule Take 150 mg by mouth 3 (three) times daily.   Yes [provider]  traMADol (ULTRAM) 50 MG tablet Take  50 mg by mouth 4 (four) times daily.    Yes [provider]  Turmeric 500 MG TABS Take 1 tablet by mouth 3 (three) times daily.   Yes [provider]  aspirin 81 MG chewable tablet Chew 81 mg by mouth daily.    [provider]  azithromycin (ZITHROMAX) 250 MG tablet Take 1 tablet (250 mg total) by mouth daily. Take 1 every day until finished. 06/14/16   Soundra Lampley, Corene Cornea, MD  HYDROcodone-homatropine [facility]) 5-1.5 MG/5ML syrup Take 5 mLs by mouth every 6 (six) hours as needed for cough. 06/14/16   Mariaisabel Bodiford, Corene Cornea, MD  predniSONE (DELTASONE) 20 MG tablet 2 tabs po daily x 4 days 06/14/16   Jeury Mcnab, Corene Cornea, MD    Family  History Family History  Problem Relation Age of Onset  . Breast cancer Mother   . Cancer Mother        breast  . Breast cancer Maternal Aunt   . Breast cancer Maternal Grandmother   . Cancer Maternal Aunt     Social History Social History  Substance Use Topics  . Smoking status: Current Every Day Smoker    Packs/day: 1.00    Years: 13.00    Types: Cigarettes    Last attempt to quit: 10/31/2011  . Smokeless tobacco: Never Used  . Alcohol use No     Allergies   Latex   Review of Systems Review of Systems  Constitutional: Positive for chills.  HENT: Positive for ear pain, rhinorrhea and sore throat.   Respiratory: Positive for cough, shortness of breath and wheezing.   Cardiovascular: Positive for chest pain.  All other systems reviewed and are negative.    Physical Exam Updated Vital Signs BP (!) 143/79   Pulse (!) 116   Temp 98.5 F (36.9 C) (Oral)   Resp (!) 22   Ht 5\' 6"  (1.676 m)   Wt 105.7 kg (233 lb)   LMP 05/07/2016   SpO2 90%   BMI 37.61 kg/m   Physical Exam  Constitutional: She appears well-developed and well-nourished.  HENT:  Head: Normocephalic and atraumatic.  Eyes: Conjunctivae and EOM are normal.  Neck: Normal range of motion.  Cardiovascular: Normal rate and regular rhythm.   Pulmonary/Chest: Effort normal. No stridor. Tachypnea noted. No respiratory distress. She has decreased breath sounds. She has wheezes.  Abdominal: Soft. She exhibits no distension.  Neurological: She is alert.  Skin: Skin is warm and dry.  Nursing note and vitals reviewed.    ED Treatments / Results  Labs (all labs ordered are listed, but only abnormal results are displayed) Labs Reviewed  CBC WITH DIFFERENTIAL/PLATELET - Abnormal; Notable for the following:       Result Value   WBC 14.2 (*)    Neutro Abs 10.7 (*)    All other components within normal limits  BASIC METABOLIC PANEL - Abnormal; Notable for the following:    Sodium 133 (*)    Chloride 96  (*)    Glucose, Bld 462 (*)    All other components within normal limits  POCT I-STAT, CHEM 8 - Abnormal; Notable for the following:    Sodium 178 (*)    Chloride 82 (*)    Glucose, Bld 447 (*)    Hemoglobin 18.4 (*)    HCT 54.0 (*)    All other components within normal limits  D-DIMER, QUANTITATIVE (NOT AT [facility])  I-STAT CHEM 8, ED    EKG  EKG Interpretation  Date/Time:  Monday June 14 2016 20:10:51 EDT Ventricular Rate:  108 PR Interval:    QRS Duration: 88 QT Interval:  312 QTC Calculation: 419 R Axis:   74 Text Interpretation:  Sinus tachycardia Low voltage, precordial leads ST elev, probable normal early repol pattern No previous ECGs available Confirmed by Gareth Morgan [phone]) on 06/15/2016 11:20:37 PM       Radiology Dg Chest 2 View  Result Date: 06/14/2016 CLINICAL DATA:  Worsening of cough and dyspnea. Left-sided chest pain x3 days. EXAM: CHEST  2 VIEW COMPARISON:  11/28/2011 FINDINGS: The heart size and mediastinal contours are within normal limits. Both lungs are clear. The visualized skeletal structures are unremarkable. IMPRESSION: No active cardiopulmonary disease. Electronically Signed   By: Ashley Royalty M.D.   On: 06/14/2016 19:17    Procedures Procedures (including critical care time)  Medications Ordered in ED Medications  lactated ringers bolus 1,000 mL (0 mLs Intravenous Stopped 06/14/16 2246)  predniSONE (DELTASONE) tablet 60 mg (60 mg Oral Given 06/14/16 2026)  HYDROcodone-homatropine (HYCODAN) 5-1.5 MG/5ML syrup 5 mL (5 mLs Oral Given 06/14/16 2026)  azithromycin (ZITHROMAX) 500 mg in dextrose 5 % 250 mL IVPB (0 mg Intravenous Stopped 06/14/16 2131)  ipratropium-albuterol (DUONEB) 0.5-2.5 (3) MG/3ML nebulizer solution 3 mL (3 mLs Nebulization Given 06/14/16 2046)  albuterol (PROVENTIL HFA;VENTOLIN HFA) 108 (90 Base) MCG/ACT inhaler 4 puff (4 puffs Inhalation Given 06/14/16 2336)     Initial Impression / Assessment and Plan / ED Course  I have  reviewed the triage vital signs and the nursing notes.  Pertinent labs & imaging results that were available during my care of the patient were reviewed by me and considered in my medical decision making (see chart for details).    Bronchitis with hypoxia. Patient feels better and does not want to stay in hospital. Able to ambulate without getting more tachypneic and subjectively feels "100% better". Discussed O2 of 87% and possibly worsening condition at home but patient insists she is better and would like to be discharged. I suspect her persistent tachycardia is only mildly above normal as she has had Multiple visits in 100-110 range. D dimer negative, doubt PE.  Will dc with very close/strict return precautions. PCP follow up ASAP.   Final Clinical Impressions(s) / ED Diagnoses   Final diagnoses:  Bronchitis    New Prescriptions Discharge Medication List as of 06/14/2016 11:15 PM    START taking these medications   Details  azithromycin (ZITHROMAX) 250 MG tablet Take 1 tablet (250 mg total) by mouth daily. Take 1 every day until finished., Starting Mon 06/14/2016, Print    HYDROcodone-homatropine (HYCODAN) 5-1.5 MG/5ML syrup Take 5 mLs by mouth every 6 (six) hours as needed for cough., Starting Mon 06/14/2016, Print    predniSONE (DELTASONE) 20 MG tablet 2 tabs po daily x 4 days, Print         Louis Ivery, Corene Cornea, MD 06/16/16 [phone]

## 2016-06-14 NOTE — ED Notes (Signed)
Pt O2 sats dropped to 86%, pt placed on O2 at 2l and sats increased to 93%; Dr. Dayna Barker notified and orders given to walk pt without O2; pt ambulated around nurses' station and O2 sats decreased to 87%; pt back to bed and O2 sats are at 93%; pt states she feels better than when she came in

## 2016-06-28 ENCOUNTER — Telehealth: Payer: Self-pay | Admitting: Orthopedic Surgery

## 2016-06-28 NOTE — Telephone Encounter (Signed)
WOULD THIS BE [place] OR NEUROSURGERY?

## 2016-06-28 NOTE — Telephone Encounter (Signed)
Patient called to relay that she had her MRI of lumbar spine at [facility] in March of 2018. States was told by Dr Merlene Laughter to see Dr Aline Brochure.  Please advise if we are to schedule an appointment here or if a referral is to be made.  Pat[place]'s ph# is [phone]

## 2016-06-28 NOTE — Telephone Encounter (Signed)
SCHEDULE APPT FOR ME TO GO OVER WITH HER THEN I LL SEND TO [facility]

## 2016-06-28 NOTE — Telephone Encounter (Signed)
ROUTING TO [place]

## 2016-06-28 NOTE — Telephone Encounter (Signed)
Reached patient; appointment scheduled.

## 2016-07-05 ENCOUNTER — Ambulatory Visit: Payer: Medicaid Other | Admitting: Orthopedic Surgery

## 2016-07-05 ENCOUNTER — Encounter: Payer: Self-pay | Admitting: Orthopedic Surgery

## 2016-07-20 ENCOUNTER — Ambulatory Visit (INDEPENDENT_AMBULATORY_CARE_PROVIDER_SITE_OTHER): Payer: Medicaid Other | Admitting: Orthopedic Surgery

## 2016-07-20 DIAGNOSIS — M545 Low back pain, unspecified: Secondary | ICD-10-CM

## 2016-07-20 DIAGNOSIS — G8929 Other chronic pain: Secondary | ICD-10-CM | POA: Diagnosis not present

## 2016-07-20 DIAGNOSIS — M549 Dorsalgia, unspecified: Secondary | ICD-10-CM | POA: Diagnosis not present

## 2016-07-20 NOTE — Progress Notes (Signed)
Routine follow-up visit  MRI follow-up with chief complaint of back pain with right leg pain radiation.  The patient has had lower back pain for a long time she has been in chronic pain management as well. She finally had an MRI in March and her symptoms have worsened she is now complaining of frequent giving way episodes of the right leg, increasing right leg pain, inability to sleep on her back, worsening pain at night  Her bowel and bladder function have still maintain control  Upon my review and interpretation: Her MRI shows a ruptured disc primarily at L2 and 3 but there are other areas of disease  Here is the report as it was read IMPRESSION: Right paracentral disc protrusion L2-3 causing spinal stenosis and right subarticular stenosis. Expected impingement of the right L3 nerve root.   Left paracentral disc protrusion L3-4 with mild spinal stenosis   Left paracentral disc and osteophyte complex at L4-5. Mild subarticular zone stenosis on the left and mild narrowing of the canal.   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     Electronically Signed   By: Franchot Gallo M.D.   On: 03/26/2016 09:53   Referral will be made to neurosurgery for evaluation treatment and management

## 2016-07-20 NOTE — Patient Instructions (Signed)
Lumbar Diskectomy Lumbar diskectomy is a surgical procedure that treats low back pain that is caused by an injured disk. Disks are cushions in your spine that are between your spinal bones (vertebrae). When a disk ruptures or pushes out of its normal space (herniates), it can cause pressure on your spinal cord or the nerves that leave your spinal cord. This can cause back pain, numbness, or weakness. You may need this procedure if other treatments have not worked. Tell a health care provider about:  Any allergies you have.  All medicines you are taking, including vitamins, herbs, eye drops, creams, and over-the-counter medicines.  Any problems you or family members have had with anesthetic medicines.  Any blood disorders you have.  Any surgeries you have had.  Any medical conditions you have. What are the risks? Generally, this is a safe procedure. However, problems may occur, including:  Bleeding.  Infection.  Damage to the nerve or spinal cord.  Worsening pain.  Failure to relieve your symptoms.  What happens before the procedure?  You may need an imaging study done of your spine to help plan the procedure.  Follow instructions from your health care provider about eating or drinking restrictions.  Ask your health care provider about: ? Changing or stopping your regular medicines. This is especially important if you are taking diabetes medicines or blood thinners. ? Taking medicines such as aspirin and ibuprofen. These medicines can thin your blood. Do not take these medicines before your procedure if your health care provider instructs you not to.  Do not drink alcohol.  Do not use any tobacco products, including cigarettes, chewing tobacco, and electronic cigarettes. If you need help quitting, ask your health care provider.  Plan to have someone take you home after the procedure.  If you go home right after the procedure, plan to have someone with you for 24  hours. What happens during the procedure?  An IV tube will be inserted into one of your veins.  You will be given one or more of the following: ? A medicine that helps you relax (sedative). ? A medicine that numbs the area (local anesthetic). ? A medicine that makes you fall asleep (general anesthetic). ? A medicine that is injected into your spine that numbs the area below and slightly above the injection site (spinal anesthetic).  You will be positioned face-down on the operating table.  Your lower back will be cleaned with a germ-killing (antiseptic) solution.  Your surgeon will make an incision over your spine.  The muscles and nerves over your spine will be moved aside so your vertebrae and injured disk can be seen easily. Your surgeon may use a specific type of operating microscope.  Your surgeon may need to remove some connecting tissue (ligaments) or pieces of bone to get to your disk.  Your surgeon will remove the part of the disk that is causing your symptoms.  The muscles and nerves will be put back in their normal position.  The incision will be closed with stitches (sutures) or staples. A bandage (dressing) will be placed over the incision. The procedure may vary among health care providers and hospitals. What happens after the procedure?  Your blood pressure, heart rate, breathing rate, and blood oxygen level will be monitored often until the medicines you were given have worn off.  Your IV tube can be taken out when you are able to drink fluids on your own.  You will be encouraged to get up and  walk around as soon as you can.  You may meet with a physical therapist to talk about recovering at home. This information is not intended to replace advice given to you by your health care provider. Make sure you discuss any questions you have with your health care provider. Document Released: 05/07/2014 Document Revised: 05/29/2015 Document Reviewed: 11/28/2013 Elsevier  Interactive Patient Education  [employer].

## 2016-07-27 ENCOUNTER — Other Ambulatory Visit: Payer: Self-pay | Admitting: *Deleted

## 2016-07-27 ENCOUNTER — Telehealth: Payer: Self-pay | Admitting: Orthopedic Surgery

## 2016-07-27 DIAGNOSIS — M5126 Other intervertebral disc displacement, lumbar region: Secondary | ICD-10-CM

## 2016-07-27 NOTE — Telephone Encounter (Signed)
They will have to review her records and then they will call to schedule, it is a process and takes time.

## 2016-07-27 NOTE — Telephone Encounter (Signed)
Patient called and wanted to check up on the referral to neurosurgery for her.  Would you please call the patient regarding this?   Thanks

## 2016-08-10 ENCOUNTER — Telehealth: Payer: Self-pay | Admitting: Orthopedic Surgery

## 2016-08-10 ENCOUNTER — Other Ambulatory Visit: Payer: Self-pay | Admitting: *Deleted

## 2016-08-10 DIAGNOSIS — M549 Dorsalgia, unspecified: Secondary | ICD-10-CM

## 2016-08-10 NOTE — Telephone Encounter (Signed)
Patient called back, aware, and may also call [facility] directly regarding status of referral, in event they may have tried to call her.

## 2016-08-10 NOTE — Telephone Encounter (Signed)
Patient requests a shower chair "small sized one, to fit into tub" - would like it sent to [employer].  Her insurance is Medicaid; therefore, diagnosis, notes will be needed. Please advise. Patient ph [phone]

## 2016-08-12 ENCOUNTER — Other Ambulatory Visit: Payer: Self-pay | Admitting: Neurological Surgery

## 2016-08-19 ENCOUNTER — Other Ambulatory Visit (HOSPITAL_COMMUNITY): Payer: Self-pay

## 2016-08-19 NOTE — Pre-Procedure Instructions (Signed)
Felicia Reynolds  08/19/2016      [id number] - [place], [place] - [address] AT [address] [place] [address] Phone: [phone] Fax: [phone]  [facility], [place] [address] AT [address]. [place] [address] Phone: [phone] Fax: [place], [address] [place] [address] Phone: [phone] Fax: [phone]    Your procedure is scheduled on Tuesday, August 24, 2016  Report to [facility] Admitting Entrance "A" at 10:00 A.M.   Call this number if you have problems the morning of surgery:  [phone]   Remember:  Do not eat food or drink liquids after midnight on August 23, 2016  Take these medicines the morning of surgery with A SIP OF WATER: Pantoprazole ([place]), Pregabalin (LYRICA). If needed  HYDROcodone-acetaminophen [facility]) for pain and  Cyclobenzaprine (FLEXERIL) for spasms.  7 days before surgery stop taking all Aspirins, Vitamins, Fish oils, and Herbal medications. Also stop all NSAIDS i.e. Advil, Motrin, Aleve, Anaprox, Naproxen, BC and Goody Powders.  How to Manage Your Diabetes Before and After Surgery  Why is it important to control my blood sugar before and after surgery? . Improving blood sugar levels before and after surgery helps healing and can limit problems. . A way of improving blood sugar control is eating a healthy diet by: o  Eating less sugar and carbohydrates o  Increasing activity/exercise o  Talking with your doctor about reaching your blood sugar goals . High blood sugars (greater than 180 mg/dL) can raise your risk of infections and slow your recovery, so you will need to focus on controlling your diabetes during the weeks before surgery. . Make sure that the doctor who takes care of your diabetes knows about your planned surgery including the  date and location.  How do I manage my blood sugar before surgery? . Check your blood sugar at least 4 times a day, starting 2 days before surgery, to make sure that the level is not too high or low. o Check your blood sugar the morning of your surgery when you wake up and every 2 hours until you get to the Short Stay unit. . If your blood sugar is less than 70 mg/dL, you will need to treat for low blood sugar: o Do not take insulin. o Treat a low blood sugar (less than 70 mg/dL) with  cup of clear juice (cranberry or apple), 4 glucose tablets, OR glucose gel. o Recheck blood sugar in 15 minutes after treatment (to make sure it is greater than 70 mg/dL). If your blood sugar is not greater than 70 mg/dL on recheck, call [phone] for further instructions. . Report your blood sugar to the short stay nurse when you get to Short Stay.  . If you are admitted to the hospital after surgery: o Your blood sugar will be checked by the staff and you will probably be given insulin after surgery (instead of oral diabetes medicines) to make sure you have good blood sugar levels. o The goal for blood sugar control after surgery is 80-180 mg/dL.  WHAT DO I DO ABOUT MY DIABETES MEDICATION?   Marland Kitchen Do not take oral diabetes medicines (pills) the morning of surgery.  . THE DAY BEFORE SURGERY, take ____70_______ units of ______LANTUS_____insulin.       Marland Kitchen  THE MORNING OF SURGERY, take _____35________ units of ____LANTUS______insulin.  . The day of surgery, do not take other diabetes injectables, including Byetta (exenatide), Bydureon (exenatide ER), Victoza (liraglutide), or Trulicity (dulaglutide).   Do not wear jewelry, make-up or nail polish.  Do not wear lotions, powders, or perfumes, or deodorant.  Do not shave 48 hours prior to surgery.    Do not bring valuables to the hospital.  [facility] is not responsible for any belongings or valuables.  Contacts, dentures or bridgework may not be worn into  surgery.  Leave your suitcase in the car.  After surgery it may be brought to your room.  For patients admitted to the hospital, discharge time will be determined by your treatment team.  Patients discharged the day of surgery will not be allowed to drive home.   Special instructions:  [facility]- Preparing For Surgery  Before surgery, you can play an important role. Because skin is not sterile, your skin needs to be as free of germs as possible. You can reduce the number of germs on your skin by washing with CHG (chlorahexidine gluconate) Soap before surgery.  CHG is an antiseptic cleaner which kills germs and bonds with the skin to continue killing germs even after washing.  Please do not use if you have an allergy to CHG or antibacterial soaps. If your skin becomes reddened/irritated stop using the CHG.  Do not shave (including legs and underarms) for at least 48 hours prior to first CHG shower. It is OK to shave your face.  Please follow these instructions carefully.   1. Shower the NIGHT BEFORE SURGERY and the MORNING OF SURGERY with CHG.   2. If you chose to wash your hair, wash your hair first as usual with your normal shampoo.  3. After you shampoo, rinse your hair and body thoroughly to remove the shampoo.  4. Use CHG as you would any other liquid soap. You can apply CHG directly to the skin and wash gently with a scrungie or a clean washcloth.   5. Apply the CHG Soap to your body ONLY FROM THE NECK DOWN.  Do not use on open wounds or open sores. Avoid contact with your eyes, ears, mouth and genitals (private parts). Wash genitals (private parts) with your normal soap.  6. Wash thoroughly, paying special attention to the area where your surgery will be performed.  7. Thoroughly rinse your body with warm water from the neck down.  8. DO NOT shower/wash with your normal soap after using and rinsing off the CHG Soap.  9. Pat yourself dry with a CLEAN TOWEL.   10. Wear CLEAN  PAJAMAS   11. Place CLEAN SHEETS on your bed the night of your first shower and DO NOT SLEEP WITH PETS.  Day of Surgery: Do not apply any deodorants/lotions. Please wear clean clothes to the hospital/surgery center.    Please read over the following fact sheets that you were given. Pain Booklet, Coughing and Deep Breathing, MRSA Information and Surgical Site Infection Prevention

## 2016-08-20 ENCOUNTER — Encounter (HOSPITAL_COMMUNITY): Payer: Self-pay | Admitting: Vascular Surgery

## 2016-08-20 ENCOUNTER — Encounter (HOSPITAL_COMMUNITY): Payer: Self-pay

## 2016-08-20 ENCOUNTER — Encounter (HOSPITAL_COMMUNITY)
Admission: RE | Admit: 2016-08-20 | Discharge: 2016-08-20 | Disposition: A | Discharge status: Home / Self Care | Payer: Medicaid Other | Source: Ambulatory Visit | Attending: Neurological Surgery | Admitting: Neurological Surgery

## 2016-08-20 DIAGNOSIS — M1990 Unspecified osteoarthritis, unspecified site: Secondary | ICD-10-CM | POA: Diagnosis not present

## 2016-08-20 DIAGNOSIS — F17200 Nicotine dependence, unspecified, uncomplicated: Secondary | ICD-10-CM | POA: Diagnosis not present

## 2016-08-20 DIAGNOSIS — F410 Panic disorder [episodic paroxysmal anxiety] without agoraphobia: Secondary | ICD-10-CM | POA: Insufficient documentation

## 2016-08-20 DIAGNOSIS — M797 Fibromyalgia: Secondary | ICD-10-CM | POA: Insufficient documentation

## 2016-08-20 DIAGNOSIS — Z01812 Encounter for preprocedural laboratory examination: Secondary | ICD-10-CM | POA: Diagnosis not present

## 2016-08-20 DIAGNOSIS — M810 Age-related osteoporosis without current pathological fracture: Secondary | ICD-10-CM | POA: Insufficient documentation

## 2016-08-20 DIAGNOSIS — G629 Polyneuropathy, unspecified: Secondary | ICD-10-CM | POA: Diagnosis not present

## 2016-08-20 DIAGNOSIS — E1165 Type 2 diabetes mellitus with hyperglycemia: Secondary | ICD-10-CM | POA: Diagnosis not present

## 2016-08-20 DIAGNOSIS — Z9889 Other specified postprocedural states: Secondary | ICD-10-CM | POA: Diagnosis not present

## 2016-08-20 DIAGNOSIS — F319 Bipolar disorder, unspecified: Secondary | ICD-10-CM | POA: Insufficient documentation

## 2016-08-20 DIAGNOSIS — I10 Essential (primary) hypertension: Secondary | ICD-10-CM | POA: Diagnosis not present

## 2016-08-20 HISTORY — DX: Panic disorder (episodic paroxysmal anxiety): F41.0

## 2016-08-20 HISTORY — DX: Anxiety disorder, unspecified: F41.9

## 2016-08-20 HISTORY — DX: Bipolar disorder, unspecified: F31.9

## 2016-08-20 HISTORY — DX: Pneumonia, unspecified organism: J18.9

## 2016-08-20 HISTORY — DX: Essential (primary) hypertension: I10

## 2016-08-20 LAB — CBC
HEMATOCRIT: 42.5 % (ref 36.0–46.0)
HEMOGLOBIN: 14.6 g/dL (ref 12.0–15.0)
MCH: 28.3 pg (ref 26.0–34.0)
MCHC: 34.4 g/dL (ref 30.0–36.0)
MCV: 82.4 fL (ref 78.0–100.0)
Platelets: 212 10*3/uL (ref 150–400)
RBC: 5.16 MIL/uL — ABNORMAL HIGH (ref 3.87–5.11)
RDW: 13.7 % (ref 11.5–15.5)
WBC: 12.7 10*3/uL — ABNORMAL HIGH (ref 4.0–10.5)

## 2016-08-20 LAB — HEMOGLOBIN A1C
HEMOGLOBIN A1C: 13.6 % — AB (ref 4.8–5.6)
MEAN PLASMA GLUCOSE: 343.62 mg/dL

## 2016-08-20 LAB — BASIC METABOLIC PANEL
ANION GAP: 9 (ref 5–15)
BUN: 7 mg/dL (ref 6–20)
CHLORIDE: 99 mmol/L — AB (ref 101–111)
CO2: 22 mmol/L (ref 22–32)
Calcium: 8.7 mg/dL — ABNORMAL LOW (ref 8.9–10.3)
Creatinine, Ser: 0.81 mg/dL (ref 0.44–1.00)
GFR calc Af Amer: >60 mL/min (ref >60)
GFR calc non Af Amer: >60 mL/min (ref >60)
GLUCOSE: 599 mg/dL — AB (ref 65–99)
Potassium: 4.4 mmol/L (ref 3.5–5.1)
Sodium: 130 mmol/L — ABNORMAL LOW (ref 135–145)

## 2016-08-20 LAB — SURGICAL PCR SCREEN
MRSA, PCR: NEGATIVE
STAPHYLOCOCCUS AUREUS: NEGATIVE

## 2016-08-20 LAB — GLUCOSE, CAPILLARY: Glucose-Capillary: 558 mg/dL (ref 65–99)

## 2016-08-20 LAB — HCG, SERUM, QUALITATIVE: Preg, Serum: NEGATIVE

## 2016-08-20 NOTE — Progress Notes (Signed)
[facility] [place]  Neurologist- Dr. Nils Flack- Dr. Kenton Kingfisher or Aline Brochure  Cardiologist - Denies  Chest x-ray - 06/14/16 (E)  EKG - 06/14/16 (E)  Stress Test - Denies  ECHO - Denies  Cardiac Cath - Denies  Sleep Study - Yes- Negative CPAP - None  Fasting Blood Sugar -558 Checks Blood Sugar __2-3___ times a day  Shelby Dubin.- PA for anesthesia called and notified of pt's CBG of 558 and BG of 599. Pt denies nausea, vomiting, increased thirst, dizziness, or frequent urination. Chart will be given for review based on today's lab results.   Pt denies having chest pain, sob, or fever at this time. All instructions explained to the pt, with a verbal understanding of the material. Pt agrees to go over the instructions while at home for a better understanding. Pt advised by Ebony Hail to go to the emergency room to have her BG and non-healing burn wound to the LLQ of her abdomen evaluated by a physician. Pt's sts she will go to an ER in [place], as that will be closer to her home. Pt advised to call our office with an update on her condition, as well as to call Dr. Cyndy Freeze and notify him as well. Pt gave a verbal agreement. The opportunity to ask questions was provided.

## 2016-08-20 NOTE — Progress Notes (Signed)
Anesthesia PAT Evaluation: Patient is a 36 year old female scheduled for right L2-3 discectomy with right L3-4 laminoforaminotomy on 08/24/16 by Dr. Marland Kitchen Ditty.  History includes uncontrolled DM2, smoking, arthritis, fibromyalgia, osteoporosis, neuropathy, HTN, Bipolar disorder, anxiety with panic attacks, hernia repair (not specified), throat surgery (not specified). ED visit 06/14/16 for cough/bronchitis with hypoxia. She refused admission following receipt of IVF, Duoneb, prednisone, Hycodan and was discharged with prednisone taper and Zithromax.    PCP is Dr. Monico Blitz. Endocrinologist is Dr. Loni Beckwith.   Meds include cinnamon, Flexeril, Norco, Lantus 70 units subcutaneously twice a day, lisinopril, Megace, metformin, Protonix, Lyrica, turmeric. She reports being complaint with her DM regimen, but says she did not take Lantus this morning because she did not think she was suppose to for her PAT visit. She was on a "pill" for DM when she was in [place] until February and reported sugars were much better controlled then. She says she has been on Lantus since ~ March. States fasting CBGs are now sometimes in the 400-500 range. She does report a "burn" on her abdomen that has been treated with Keflex.   BP (!) 155/83   Pulse 100   Temp 36.9 C   Resp 20   Ht 5\' 6"  (1.676 m)   Wt 236 lb 6.4 oz (107.2 kg)   LMP 08/06/2016 (Exact Date)   SpO2 99%   BMI 38.16 kg/m Heart RRR at 100 bpm, no murmur. Lungs with faint wheeze left base that cleared with deep breathing--otherwise lungs clear. No pitting ankle edema. Mallampati III. She is A&O X 4. No conversational dyspnea. Cooperative with exam and questioning.    EKG 06/14/16: ST at 108 bpm, low voltage precordial leads, ST elevation, probably normal early repolarization pattern. There are no comparison 12L EKG tracings in Epic or Muse. She denied chest pain, SOB, edema, syncope. She denied prior cardiac testing such as stress, echo, or cath.  Her activity is limited due to back and right leg pain. Today she used a rolling walker. She also has a wheelchair at home.   CXR 06/14/16: IMPRESSION: No active cardiopulmonary disease.  Preoperative labs noted. Called by PAT RN regarding critical glucose of 599. A1c 13.6, consistent with average glucose of 344. Na 130, CO2 22, anion gap 9. Cr 0.81. WBC 12.7, H/H 14.6/42.5, PLT 212. Patient denied blurred vision, generalized weakness, chest pain, SOB, N/V, abdominal pain. She was given water to drink while at PAT given her high glucose. I also called Dr. Trena Platt office and was advised to contact Dr. Dorris Fetch. I spoke with his staff regarding sending patient to the ED or if they would rather her go to their office. I was told that Dr. Dorris Fetch agreed with sending patient to the ED. I discussed taking patient to [facility] ED; however, patient declined citing that her sister drove her and had 5 children to get home to, so she desired to go to [facility] ED instead so she could be dropped off and family would be close by to pick her up when needed. We discussed that the safest option would be to go to [facility] ED since this was closer, but if she refused that I would at least notify [facility] ED staff (which I did ~ 10:00 AM), that she should not drive her self, and that she should go immediately to the [facility] ED from [facility]. We discussed that untreated hyper or hypo -glycemia could lead to severe acute complications such as state of severe dehydrations, N/V,  SOB, confusion, coma, seizures, and even death. Patient verbalized that she would go directly to [facility] ED.  I asked PAT RN to also advise patient that if she chose to go to [facility] ED (instead of [facility] ED), their staff stated that they would have to keep her in the ED if she required admission since currently there were no in-patient beds available. I also advised that an elective surgery would likely have to be rescheduled if DM not better controlled. I also updated Jessica at Dr. Hewitt Shorts office.    Felicia Reynolds [facility] Short Stay Center/Anesthesiology Phone [phone] 08/20/2016 11:39 AM

## 2016-08-22 ENCOUNTER — Encounter (HOSPITAL_COMMUNITY): Payer: Self-pay | Admitting: *Deleted

## 2016-08-22 ENCOUNTER — Emergency Department (HOSPITAL_COMMUNITY)
Admission: EM | Admit: 2016-08-22 | Discharge: 2016-08-22 | Disposition: A | Discharge status: Home / Self Care | Payer: Medicaid Other | Attending: Emergency Medicine | Admitting: Emergency Medicine

## 2016-08-22 DIAGNOSIS — Y999 Unspecified external cause status: Secondary | ICD-10-CM | POA: Insufficient documentation

## 2016-08-22 DIAGNOSIS — Y939 Activity, unspecified: Secondary | ICD-10-CM | POA: Diagnosis not present

## 2016-08-22 DIAGNOSIS — R739 Hyperglycemia, unspecified: Secondary | ICD-10-CM

## 2016-08-22 DIAGNOSIS — E1165 Type 2 diabetes mellitus with hyperglycemia: Secondary | ICD-10-CM | POA: Insufficient documentation

## 2016-08-22 DIAGNOSIS — S3981XA Other specified injuries of abdomen, initial encounter: Secondary | ICD-10-CM | POA: Insufficient documentation

## 2016-08-22 DIAGNOSIS — Z79899 Other long term (current) drug therapy: Secondary | ICD-10-CM | POA: Diagnosis not present

## 2016-08-22 DIAGNOSIS — Z794 Long term (current) use of insulin: Secondary | ICD-10-CM | POA: Insufficient documentation

## 2016-08-22 DIAGNOSIS — Y929 Unspecified place or not applicable: Secondary | ICD-10-CM | POA: Diagnosis not present

## 2016-08-22 DIAGNOSIS — E1140 Type 2 diabetes mellitus with diabetic neuropathy, unspecified: Secondary | ICD-10-CM | POA: Diagnosis not present

## 2016-08-22 DIAGNOSIS — Z87891 Personal history of nicotine dependence: Secondary | ICD-10-CM | POA: Insufficient documentation

## 2016-08-22 DIAGNOSIS — Z91040 Latex allergy status: Secondary | ICD-10-CM | POA: Insufficient documentation

## 2016-08-22 DIAGNOSIS — X58XXXA Exposure to other specified factors, initial encounter: Secondary | ICD-10-CM | POA: Diagnosis not present

## 2016-08-22 DIAGNOSIS — T148XXA Other injury of unspecified body region, initial encounter: Secondary | ICD-10-CM

## 2016-08-22 LAB — URINALYSIS, ROUTINE W REFLEX MICROSCOPIC
BILIRUBIN URINE: NEGATIVE
Glucose, UA: >=500 mg/dL — AB
Hgb urine dipstick: NEGATIVE
Ketones, ur: NEGATIVE mg/dL
Nitrite: NEGATIVE
PH: 5 (ref 5.0–8.0)
Protein, ur: 30 mg/dL — AB
RBC / HPF: NONE SEEN RBC/hpf (ref 0–5)
SPECIFIC GRAVITY, URINE: 1.028 (ref 1.005–1.030)

## 2016-08-22 LAB — BASIC METABOLIC PANEL
Anion gap: 9 (ref 5–15)
BUN: 9 mg/dL (ref 6–20)
CALCIUM: 8.7 mg/dL — AB (ref 8.9–10.3)
CO2: 24 mmol/L (ref 22–32)
CREATININE: 0.84 mg/dL (ref 0.44–1.00)
Chloride: 101 mmol/L (ref 101–111)
Glucose, Bld: 480 mg/dL — ABNORMAL HIGH (ref 65–99)
Potassium: 4.3 mmol/L (ref 3.5–5.1)
SODIUM: 134 mmol/L — AB (ref 135–145)

## 2016-08-22 LAB — CBC
HCT: 43.2 % (ref 36.0–46.0)
Hemoglobin: 14.6 g/dL (ref 12.0–15.0)
MCH: 28.5 pg (ref 26.0–34.0)
MCHC: 33.8 g/dL (ref 30.0–36.0)
MCV: 84.2 fL (ref 78.0–100.0)
PLATELETS: 220 10*3/uL (ref 150–400)
RBC: 5.13 MIL/uL — AB (ref 3.87–5.11)
RDW: 14 % (ref 11.5–15.5)
WBC: 13.4 10*3/uL — AB (ref 4.0–10.5)

## 2016-08-22 LAB — CBG MONITORING, ED
GLUCOSE-CAPILLARY: 402 mg/dL — AB (ref 65–99)
GLUCOSE-CAPILLARY: 443 mg/dL — AB (ref 65–99)
GLUCOSE-CAPILLARY: 226 mg/dL — AB (ref 65–99)
Glucose-Capillary: 261 mg/dL — ABNORMAL HIGH (ref 65–99)

## 2016-08-22 MED ORDER — INSULIN ASPART 100 UNIT/ML IV SOLN
5.0000 [IU] | Freq: Once | INTRAVENOUS | Status: AC
  Administered 2016-08-22: 5 [IU] via INTRAVENOUS

## 2016-08-22 MED ORDER — BACITRACIN ZINC 500 UNIT/GM EX OINT
TOPICAL_OINTMENT | CUTANEOUS | Status: AC
  Filled 2016-08-22: qty 1.8

## 2016-08-22 MED ORDER — INSULIN ASPART 100 UNIT/ML ~~LOC~~ SOLN
10.0000 [IU] | Freq: Once | SUBCUTANEOUS | Status: AC
  Administered 2016-08-22: 10 [IU] via INTRAVENOUS
  Filled 2016-08-22: qty 1

## 2016-08-22 MED ORDER — INSULIN ASPART 100 UNIT/ML ~~LOC~~ SOLN
SUBCUTANEOUS | Status: AC
  Administered 2016-08-22: 5 [IU] via INTRAVENOUS
  Filled 2016-08-22: qty 1

## 2016-08-22 MED ORDER — SODIUM CHLORIDE 0.9 % IV SOLN: INTRAVENOUS | Status: DC

## 2016-08-22 MED ORDER — SODIUM CHLORIDE 0.9 % IV BOLUS (SEPSIS)
1000.0000 mL | Freq: Once | INTRAVENOUS | Status: AC
  Administered 2016-08-22: 1000 mL via INTRAVENOUS

## 2016-08-22 NOTE — ED Notes (Signed)
CBG 402 in triage

## 2016-08-22 NOTE — ED Triage Notes (Signed)
Pt was sent here from pre-op (scheduled procedure on 8/21). They told her that her cbg was too high. Pt states she had been nauseous and vomiting. She has an area of infection on her LLQ of abdomen that started 2 weeks ago.

## 2016-08-22 NOTE — ED Notes (Signed)
Pt reports that her A1C is over 13- She takes insulin 3 times daily and glucophage at night Pt diagnosed with diabetes at age 36 She is morbidly obese  She was cancelled for back surgery opn Friday due to her bld sugar being elevated, told to come to ED, yet states did not have a ride here to follow as directed

## 2016-08-22 NOTE — ED Provider Notes (Signed)
[facility] DEPT Provider Note   CSN: [id number] Arrival date & time: 08/22/16  1048     History   Chief Complaint Chief Complaint  Patient presents with  . Hyperglycemia    HPI Felicia Reynolds is a 36 y.o. female.  Patient was scheduled for back surgery August 21. At her preop screen. They noticed that her blood sugars were very high. And that she was an infection to the left lower quadrant aspect of her abdomen. It apparently started 2 weeks ago. This was on Friday. The patient was not able to get seen until today. Patient reports her A1c's have been over 69. She taking her insulin as directed. She does have a primary care doctor. She is also followed by endocrinology here in [place]. Review of her ED visits only show the blood sugars in the 400 range. Regarding the wound patient states it started about 2 weeks ago. She thought was a bug bite. Initially just had some spreading redness and then it got this deep ulceration that occurred. It now has some purulent discharge. Patient states she is taking her insulin and her more metformin as directed. Patient has not seen her primary care doctor in about a month. In addition patient complaining of some mild left eye redness. Her daughter is here being evaluated for pinkeye.      Past Medical History:  Diagnosis Date  . Anxiety   . Arthritis   . Bipolar disorder ([place])   . Diabetes ([place]) 06/05/2015  . Diabetes mellitus   . Fibromyalgia   . History of abnormal cervical Pap smear 05/29/2015  . History of diabetes mellitus, type II 05/29/2015  . Hypertension   . Neuropathy   . Obesity 05/29/2015  . Osteoporosis   . Ovarian cyst 02/20/2016  . Ovarian cyst 02/20/2016   Simple cyst rt ovary  . Panic attacks   . Pneumonia   . Superficial fungus infection of skin 05/29/2015  . Vitamin D deficiency 06/05/2015    Patient Active Problem List   Diagnosis Date Noted  . Uncontrolled type 2 diabetes mellitus with complication ([place])  23/76/2831  . Personal history of noncompliance with medical treatment, presenting hazards to health 05/05/2016  . Essential hypertension, benign 05/05/2016  . Ovarian cyst 02/20/2016  . Diabetes ([place]) 06/05/2015  . Vitamin D deficiency 06/05/2015  . Superficial fungus infection of skin 05/29/2015  . Morbid obesity ([place]) 05/29/2015  . History of diabetes mellitus, type II 05/29/2015  . History of abnormal cervical Pap smear 05/29/2015    Past Surgical History:  Procedure Laterality Date  . CESAREAN SECTION    . HERNIA REPAIR    . THROAT SURGERY      OB History    Gravida Para Term Preterm AB Living   6 1 0 0 5 0   SAB TAB Ectopic Multiple Live Births   4 0 1 0         Home Medications    Prior to Admission medications   Medication Sig Start Date End Date Taking? Authorizing Provider  cyclobenzaprine (FLEXERIL) 10 MG tablet TAKE (1) TABLET BY MOUTH TWICE A DAY AS NEEDED. Patient taking differently: TAKE (1) TABLET BY MOUTH TWICE A DAY AS NEEDED FOR MUSCLE SPASM. 05/25/16  Yes Carole Civil, MD  HYDROcodone-acetaminophen (NORCO) 7.5-325 MG tablet Take 1 tablet by mouth every 6 (six) hours as needed for moderate pain.   Yes [provider]  LANTUS SOLOSTAR 100 UNIT/ML Solostar Pen Inject 60 Units into the  skin at bedtime. Patient taking differently: Inject 70 Units into the skin 2 (two) times daily.  06/02/16  Yes Florian Buff, MD  lisinopril (PRINIVIL,ZESTRIL) 10 MG tablet Take 10 mg by mouth daily.   Yes [provider]  megestrol (MEGACE) 40 MG tablet Take 40 mg by mouth 3 (three) times daily as needed (regulate period).    Yes [provider]  metFORMIN (GLUCOPHAGE) 1000 MG tablet Take 1,000 mg by mouth 2 (two) times daily with a meal.   Yes [provider]  pantoprazole (PROTONIX) 40 MG tablet Take 40 mg by mouth daily.   Yes [provider]  pregabalin (LYRICA) 150 MG capsule Take 150 mg by mouth 3 (three) times daily.    Yes [provider]  azithromycin (ZITHROMAX) 250 MG tablet Take 1 tablet (250 mg total) by mouth daily. Take 1 every day until finished. Patient not taking: Reported on 08/17/2016 06/14/16   Mesner, Corene Cornea, MD  HYDROcodone-homatropine [facility]) 5-1.5 MG/5ML syrup Take 5 mLs by mouth every 6 (six) hours as needed for cough. Patient not taking: Reported on 08/17/2016 06/14/16   Mesner, Corene Cornea, MD  predniSONE (DELTASONE) 20 MG tablet 2 tabs po daily x 4 days Patient not taking: Reported on 08/17/2016 06/14/16   Mesner, Corene Cornea, MD    Family History Family History  Problem Relation Age of Onset  . Breast cancer Mother   . Cancer Mother        breast  . Breast cancer Maternal Aunt   . Breast cancer Maternal Grandmother   . Cancer Maternal Aunt     Social History Social History  Substance Use Topics  . Smoking status: Former Smoker    Packs/day: 1.00    Years: 13.00    Types: Cigarettes    Quit date: 10/31/2011  . Smokeless tobacco: Never Used  . Alcohol use No     Allergies   Latex   Review of Systems Review of Systems  Constitutional: Negative for fever.  HENT: Negative for congestion.   Eyes: Positive for redness.  Respiratory: Negative for shortness of breath.   Cardiovascular: Negative for chest pain.  Gastrointestinal: Positive for abdominal pain, nausea and vomiting.  Endocrine: Positive for polydipsia and polyuria.  Genitourinary: Positive for frequency. Negative for dysuria.  Musculoskeletal: Negative for back pain.  Skin: Positive for wound.  Neurological: Negative for headaches.  Hematological: Does not bruise/bleed easily.  Psychiatric/Behavioral: Negative for confusion.     Physical Exam Updated Vital Signs BP (!) 161/82   Pulse 92   Temp 97.7 F (36.5 C) (Temporal)   Resp 18   Ht 1.676 m (5\' 6" )   Wt 107 kg (236 lb)   LMP 08/06/2016 (Exact Date)   SpO2 92%   BMI 38.09 kg/m   Physical Exam  Constitutional: She is oriented to person, place,  and time. She appears well-developed and well-nourished. No distress.  HENT:  Head: Normocephalic and atraumatic.  Mouth/Throat: Oropharynx is clear and moist.  Eyes: Pupils are equal, round, and reactive to light. EOM are normal.  Mild erythema to the left conjunctiva. No significant purulent discharge.  Neck: Normal range of motion. Neck supple.  Cardiovascular: Normal rate, regular rhythm and normal heart sounds.   Pulmonary/Chest: Effort normal and breath sounds normal.  Abdominal: Soft. Bowel sounds are normal. There is tenderness.  A left lower quadrant with a ulcer measuring about 3 x 4 cm. With an area of thick exudate in the center. A border of surrounding erythema  measuring about 1-2 cm. No evidence of fluctuant or expression of large amount of pus. There is some seropurulent discharge. The area of erythema around the border of the ulcer does seem to correlate with the size of the Band-Aid Telfa pad. No blisters. No spreading erythema. Mild tenderness.  Musculoskeletal: Normal range of motion.  Neurological: She is alert and oriented to person, place, and time. No cranial nerve deficit or sensory deficit. She exhibits normal muscle tone. Coordination normal.  Skin: Skin is warm.  Nursing note and vitals reviewed.    ED Treatments / Results  Labs (all labs ordered are listed, but only abnormal results are displayed) Labs Reviewed  BASIC METABOLIC PANEL - Abnormal; Notable for the following:       Result Value   Sodium 134 (*)    Glucose, Bld 480 (*)    Calcium 8.7 (*)    All other components within normal limits  CBC - Abnormal; Notable for the following:    WBC 13.4 (*)    RBC 5.13 (*)    All other components within normal limits  URINALYSIS, ROUTINE W REFLEX MICROSCOPIC - Abnormal; Notable for the following:    APPearance CLOUDY (*)    Glucose, UA >=500 (*)    Protein, ur 30 (*)    Leukocytes, UA MODERATE (*)    Bacteria, UA RARE (*)    Squamous Epithelial / LPF TOO  NUMEROUS TO COUNT (*)    All other components within normal limits  CBG MONITORING, ED - Abnormal; Notable for the following:    Glucose-Capillary 402 (*)    All other components within normal limits  CBG MONITORING, ED - Abnormal; Notable for the following:    Glucose-Capillary 443 (*)    All other components within normal limits  CBG MONITORING, ED - Abnormal; Notable for the following:    Glucose-Capillary 261 (*)    All other components within normal limits  CBG MONITORING, ED - Abnormal; Notable for the following:    Glucose-Capillary 226 (*)    All other components within normal limits    EKG  EKG Interpretation None       Radiology No results found.  Procedures Procedures (including critical care time)  Medications Ordered in ED Medications  0.9 %  sodium chloride infusion (not administered)  bacitracin 500 UNIT/GM ointment (not administered)  sodium chloride 0.9 % bolus 1,000 mL (1,000 mLs Intravenous New Bag/Given 08/22/16 1212)  sodium chloride 0.9 % bolus 1,000 mL (1,000 mLs Intravenous New Bag/Given 08/22/16 1413)  insulin aspart (novoLOG) injection 10 Units (10 Units Intravenous Given 08/22/16 1413)  insulin aspart (novoLOG) injection 5 Units (5 Units Intravenous Given 08/22/16 1514)     Initial Impression / Assessment and Plan / ED Course  I have reviewed the triage vital signs and the nursing notes.  Pertinent labs & imaging results that were available during my care of the patient were reviewed by me and considered in my medical decision making (see chart for details).     Patient blood sugar significant elevated here. Patient's blood sugar treated here with NovoLog regular insulin initially at 10 units and then a follow-up of 5 units. Which brought the blood sugar down into the low 200s. Historically it sounds as if patient is been struggling to have good blood pressure control. However she has got a good network to help adjust this. No evidence of diabetic  ketoacidosis.  Wound left lower quadrant very well may be secondary to a spider bite  or bug bite that got secondarily infected. Feel that is involving just the adipose tissue. Seems to be no intra-abdominal process. Also there may be a component of allergic reaction to the cut Telfa pad of the Band-Aid. Because her significant redness just in that location. Patient will need follow-up for wound care. The short run will refer her to general surgery. In the meantime we'll have her treated with antibiotic ointment and just a 4 x 4 gauze applied to the area.  Patient was some mild conjunctivitis left eye. Feel that this if at all is may be viral or similar to her daughter's pink eye. Do not feel that antibiotics are required.  After fluids and improvement in the blood sugar patient overall feeling much better. Oral antibiotics for the wound are also not required. Patient stable at time of discharge. And will follow-up.  Final Clinical Impressions(s) / ED Diagnoses   Final diagnoses:  Hyperglycemia  Wound of skin    New Prescriptions New Prescriptions   No medications on file     Fredia Sorrow, MD 08/22/16 2132

## 2016-08-22 NOTE — ED Notes (Signed)
abd wound dressed with bacitracin and four x fours Pt given both tape and dressing materials

## 2016-08-22 NOTE — ED Notes (Addendum)
Pt has a wound to her LL abd that she rerports has been there for 2 weeks- She reports that she is putting peroxide on this wound Wound is open, odoriferous and deep  Her L eye is slightly swollen and pink- Her child is currently in FT to RO Pink eye

## 2016-08-22 NOTE — Discharge Instructions (Signed)
Close follow-up with primary care physician for better blood sugar control. Call general surgery Dr. Arnoldo Morale for follow-up of the wound. Continue the antibiotic ointment on the wound which is 4 x 4 dressings. Return for any new or worse symptoms.

## 2016-08-24 ENCOUNTER — Encounter (HOSPITAL_COMMUNITY): Admission: RE | Payer: Self-pay | Source: Ambulatory Visit

## 2016-08-24 ENCOUNTER — Ambulatory Visit (HOSPITAL_COMMUNITY): Admission: RE | Admit: 2016-08-24 | Payer: Medicaid Other | Source: Ambulatory Visit | Admitting: Neurological Surgery

## 2016-08-24 SURGERY — LUMBAR LAMINECTOMY/DECOMPRESSION MICRODISCECTOMY 2 LEVELS
Anesthesia: General | Laterality: Right

## 2016-09-15 ENCOUNTER — Ambulatory Visit: Payer: Medicaid Other | Admitting: "Endocrinology

## 2016-10-29 ENCOUNTER — Ambulatory Visit (INDEPENDENT_AMBULATORY_CARE_PROVIDER_SITE_OTHER): Payer: Medicaid Other | Admitting: Adult Health

## 2016-10-29 ENCOUNTER — Encounter (INDEPENDENT_AMBULATORY_CARE_PROVIDER_SITE_OTHER): Payer: Self-pay

## 2016-10-29 ENCOUNTER — Encounter: Payer: Self-pay | Admitting: Adult Health

## 2016-10-29 VITALS — BP 162/80 | HR 105 | Ht 66.0 in | Wt 242.0 lb

## 2016-10-29 DIAGNOSIS — H6504 Acute serous otitis media, recurrent, right ear: Secondary | ICD-10-CM | POA: Insufficient documentation

## 2016-10-29 DIAGNOSIS — R350 Frequency of micturition: Secondary | ICD-10-CM | POA: Diagnosis not present

## 2016-10-29 DIAGNOSIS — N921 Excessive and frequent menstruation with irregular cycle: Secondary | ICD-10-CM | POA: Diagnosis not present

## 2016-10-29 DIAGNOSIS — N390 Urinary tract infection, site not specified: Secondary | ICD-10-CM | POA: Diagnosis not present

## 2016-10-29 DIAGNOSIS — R300 Dysuria: Secondary | ICD-10-CM | POA: Diagnosis not present

## 2016-10-29 LAB — POCT URINALYSIS DIPSTICK
Blood, UA: NEGATIVE
Ketones, UA: NEGATIVE
NITRITE UA: POSITIVE

## 2016-10-29 MED ORDER — TERCONAZOLE 0.4 % VA CREA: 1.0000 | TOPICAL_CREAM | Freq: Every day | VAGINAL | 0 refills | Status: DC

## 2016-10-29 MED ORDER — CIPROFLOXACIN-DEXAMETHASONE 0.3-0.1 % OT SUSP: 4.0000 [drp] | Freq: Two times a day (BID) | OTIC | 0 refills | Status: DC

## 2016-10-29 MED ORDER — FLUCONAZOLE 150 MG PO TABS: ORAL_TABLET | ORAL | 1 refills | Status: DC

## 2016-10-29 MED ORDER — CEPHALEXIN 500 MG PO CAPS: 500.0000 mg | ORAL_CAPSULE | Freq: Four times a day (QID) | ORAL | 0 refills | Status: DC

## 2016-10-29 NOTE — Progress Notes (Signed)
Subjective:     Patient ID: Felicia Reynolds, female   DOB: 09-18-1980, 36 y.o.   MRN: [id number]  HPI Felicia Reynolds is a 36 year old white female in complaining of urinary frequency and burning with urination.Also ears hurt and periods irregular and heavy.Last A1c 13 she says. PCP is Felicia Reynolds  In [place].   Review of Systems Urinary frequency and burning  Ears hurt Periods heavy and irregular  Reviewed past medical,surgical, social and family history. Reviewed medications and allergies.     Objective:   Physical Exam BP (!) 162/80 (BP Location: Right Arm, Patient Position: Sitting, Cuff Size: Large)   Pulse (!) 105   Ht 5\' 6"  (1.676 m)   Wt 242 lb (109.8 kg)   LMP 10/20/2016 (Approximate)   BMI 39.06 kg/m urine +nitrates, 1+ protein and 4+ glucose. Skin warm and dry. Lungs: clear to ausculation bilaterally. Cardiovascular: regular rate and rhythm. Left ear +wax, right ear red and some fluid seen, has wax too. No CVAT.  She gets yeast infections easily esp with antibiotics, so will give diflucan.    Assessment:     1. Urinary tract infection without hematuria, site unspecified   2. Burning with urination   3. Urinary frequency   4. Recurrent acute serous otitis media of right ear   5. Menorrhagia with irregular cycle       Plan:    UA C&S sent Push fluids  Rx keflex 500 mg #28 take 1 qid Rx diflucan 150 mg #2 take 1 now and 1 in 3 days with 2 refills Rx ciprodex use 4 gtts in right ear bid x 7 days Rx terazol 7 cream, use as directed F/U in 3 weeks to talk with Felicia Reynolds about ablation Call PCP about sugars

## 2016-10-30 LAB — URINALYSIS, ROUTINE W REFLEX MICROSCOPIC
BILIRUBIN UA: NEGATIVE
KETONES UA: NEGATIVE
Nitrite, UA: NEGATIVE
PH UA: 5.5 (ref 5.0–7.5)
UUROB: 0.2 mg/dL (ref 0.2–1.0)

## 2016-10-30 LAB — MICROSCOPIC EXAMINATION: Casts: NONE SEEN /lpf

## 2016-11-01 LAB — URINE CULTURE

## 2016-11-16 ENCOUNTER — Other Ambulatory Visit: Payer: Self-pay | Admitting: Orthopedic Surgery

## 2016-11-18 ENCOUNTER — Encounter: Payer: Self-pay | Admitting: Obstetrics & Gynecology

## 2016-11-19 ENCOUNTER — Ambulatory Visit (INDEPENDENT_AMBULATORY_CARE_PROVIDER_SITE_OTHER): Payer: Medicaid Other | Admitting: Obstetrics & Gynecology

## 2016-11-19 ENCOUNTER — Encounter: Payer: Self-pay | Admitting: Obstetrics & Gynecology

## 2016-11-19 VITALS — BP 140/80 | HR 94 | Ht 66.0 in | Wt 242.0 lb

## 2016-11-19 DIAGNOSIS — IMO0002 Reserved for concepts with insufficient information to code with codable children: Secondary | ICD-10-CM

## 2016-11-19 DIAGNOSIS — N921 Excessive and frequent menstruation with irregular cycle: Secondary | ICD-10-CM | POA: Diagnosis not present

## 2016-11-19 DIAGNOSIS — N946 Dysmenorrhea, unspecified: Secondary | ICD-10-CM | POA: Diagnosis not present

## 2016-11-19 DIAGNOSIS — Z01818 Encounter for other preprocedural examination: Secondary | ICD-10-CM | POA: Diagnosis not present

## 2016-11-19 DIAGNOSIS — E118 Type 2 diabetes mellitus with unspecified complications: Secondary | ICD-10-CM

## 2016-11-19 DIAGNOSIS — E1165 Type 2 diabetes mellitus with hyperglycemia: Secondary | ICD-10-CM

## 2016-11-19 MED ORDER — "INSULIN SYRINGE 29G X 1/2"" 0.3 ML MISC": 1.0000 | Freq: Three times a day (TID) | 3 refills | Status: DC

## 2016-11-19 MED ORDER — INSULIN ASPART 100 UNIT/ML ~~LOC~~ SOLN: 10.0000 [IU] | Freq: Three times a day (TID) | SUBCUTANEOUS | 12 refills | Status: DC

## 2016-11-19 NOTE — Progress Notes (Signed)
Preoperative History and Physical  Felicia Reynolds is a 36 y.o. [phone] with Patient's last menstrual period was 10/20/2016 (approximate). admitted for a hysteroscopy uterine curettage and endometrial ablation.  Patient has a long history of heavy painful menstrual periods that have only partially responded to chronic megestrol therapy Unfortunately megestrol therapy may also be negatively impacting her diabetes Her most recent A1c was 13 and I am going to add NovoLog 10 units with each meal today to her regimen of metformin and Lantus  The patient's periods last usually about 7 days and they calls severe pain and cramping which causes her to double over and does not respond to any medication\she does  not have any pain with intercourse unless she is on her period The rest of the month she has no pain at all Her sonogram in February of this year was completely normal   PMH:    Past Medical History:  Diagnosis Date  . Anxiety   . Arthritis   . Bipolar disorder ([place])   . Diabetes ([place]) 06/05/2015  . Diabetes mellitus   . Fibromyalgia   . History of abnormal cervical Pap smear 05/29/2015  . History of diabetes mellitus, type II 05/29/2015  . Hypertension   . Neuropathy   . Obesity 05/29/2015  . Osteoporosis   . Ovarian cyst 02/20/2016  . Ovarian cyst 02/20/2016   Simple cyst rt ovary  . Panic attacks   . Pneumonia   . Superficial fungus infection of skin 05/29/2015  . Vitamin D deficiency 06/05/2015    PSH:     Past Surgical History:  Procedure Laterality Date  . CESAREAN SECTION    . HERNIA REPAIR    . THROAT SURGERY      POb/GynH:      OB History    Gravida Para Term Preterm AB Living   6 1 0 0 5 1   SAB TAB Ectopic Multiple Live Births   4 0 1 0        SH:   Social History   Tobacco Use  . Smoking status: Former Smoker    Packs/day: 1.00    Years: 13.00    Pack years: 13.00    Types: Cigarettes    Last attempt to quit: 10/31/2011    Years since quitting: 5.0  .  Smokeless tobacco: Never Used  Substance Use Topics  . Alcohol use: No    Alcohol/week: 0.0 oz  . Drug use: No    FH:    Family History  Problem Relation Age of Onset  . Cancer Mother        breast  . Breast cancer Maternal Aunt   . Breast cancer Maternal Grandmother   . Cancer Maternal Aunt      Allergies:  Allergies  Allergen Reactions  . Latex Itching and Rash    Medications:       Current Outpatient Medications:  .  cyclobenzaprine (FLEXERIL) 10 MG tablet, TAKE (1) TABLET BY MOUTH TWICE A DAY AS NEEDED., Disp: 40 tablet, Rfl: 0 .  diclofenac sodium (VOLTAREN) 1 % GEL, Apply topically 4 (four) times daily., Disp: , Rfl:  .  HYDROcodone-acetaminophen (NORCO) 7.5-325 MG tablet, Take 1 tablet by mouth every 6 (six) hours as needed for moderate pain., Disp: , Rfl:  .  LANTUS SOLOSTAR 100 UNIT/ML Solostar Pen, Inject 60 Units into the skin at bedtime. (Patient taking differently: Inject 70 Units into the skin 2 (two) times daily. ), Disp: 15 mL, Rfl: 3 .  lisinopril (PRINIVIL,ZESTRIL) 10 MG tablet, Take 10 mg by mouth daily., Disp: , Rfl:  .  megestrol (MEGACE) 40 MG tablet, Take 40 mg by mouth 3 (three) times daily as needed (regulate period). , Disp: , Rfl:  .  metFORMIN (GLUCOPHAGE) 1000 MG tablet, Take 1,000 mg by mouth 2 (two) times daily with a meal., Disp: , Rfl:  .  pregabalin (LYRICA) 200 MG capsule, Take 200 mg by mouth 3 (three) times daily. , Disp: , Rfl:  .  cephALEXin (KEFLEX) 500 MG capsule, Take 1 capsule (500 mg total) by mouth 4 (four) times daily. (Patient not taking: Reported on 11/19/2016), Disp: 28 capsule, Rfl: 0 .  ciprofloxacin-dexamethasone (CIPRODEX) OTIC suspension, Place 4 drops into the right ear 2 (two) times daily. (Patient not taking: Reported on 11/19/2016), Disp: 7.5 mL, Rfl: 0 .  cyclobenzaprine (FLEXERIL) 10 MG tablet, TAKE (1) TABLET BY MOUTH TWICE A DAY AS NEEDED. (Patient not taking: Reported on 11/19/2016), Disp: 40 tablet, Rfl: 0 .   fluconazole (DIFLUCAN) 150 MG tablet, Take 1 now and 1 in 3 days (Patient not taking: Reported on 11/19/2016), Disp: 2 tablet, Rfl: 1 .  HYDROcodone-homatropine (HYCODAN) 5-1.5 MG/5ML syrup, Take 5 mLs by mouth every 6 (six) hours as needed for cough. (Patient not taking: Reported on 08/17/2016), Disp: 120 mL, Rfl: 0 .  insulin aspart (NOVOLOG) 100 UNIT/ML injection, Inject 10 Units 3 (three) times daily before meals into the skin., Disp: 10 mL, Rfl: 12 .  Insulin Syringe-Needle U-100 (INSULIN SYRINGE .3CC/29GX1/2") 29G X 1/2" 0.3 ML MISC, 1 Syringe 3 (three) times daily by Does not apply route., Disp: 100 each, Rfl: 3 .  pantoprazole (PROTONIX) 40 MG tablet, Take 40 mg by mouth daily., Disp: , Rfl:  .  terconazole (TERAZOL 7) 0.4 % vaginal cream, Place 1 applicator vaginally at bedtime. (Patient not taking: Reported on 11/19/2016), Disp: 45 g, Rfl: 0  Review of Systems:   Review of Systems  Constitutional: Negative for fever, chills, weight loss, malaise/fatigue and diaphoresis.  HENT: Negative for hearing loss, ear pain, nosebleeds, congestion, sore throat, neck pain, tinnitus and ear discharge.   Eyes: Negative for blurred vision, double vision, photophobia, pain, discharge and redness.  Respiratory: Negative for cough, hemoptysis, sputum production, shortness of breath, wheezing and stridor.   Cardiovascular: Negative for chest pain, palpitations, orthopnea, claudication, leg swelling and PND.  Gastrointestinal: Positive for abdominal pain. Negative for heartburn, nausea, vomiting, diarrhea, constipation, blood in stool and melena.  Genitourinary: Negative for dysuria, urgency, frequency, hematuria and flank pain.  Musculoskeletal: Negative for myalgias, back pain, joint pain and falls.  Skin: Negative for itching and rash.  Neurological: Negative for dizziness, tingling, tremors, sensory change, speech change, focal weakness, seizures, loss of consciousness, weakness and headaches.   Endo/Heme/Allergies: Negative for environmental allergies and polydipsia. Does not bruise/bleed easily.  Psychiatric/Behavioral: Negative for depression, suicidal ideas, hallucinations, memory loss and substance abuse. The patient is not nervous/anxious and does not have insomnia.      PHYSICAL EXAM:  Blood pressure 140/80, pulse 94, height 5\' 6"  (1.676 m), weight 242 lb (109.8 kg), last menstrual period 10/20/2016.    Vitals reviewed. Constitutional: She is oriented to person, place, and time. She appears well-developed and well-nourished.  HENT:  Head: Normocephalic and atraumatic.  Right Ear: External ear normal.  Left Ear: External ear normal.  Nose: Nose normal.  Mouth/Throat: Oropharynx is clear and moist.  Eyes: Conjunctivae and EOM are normal. Pupils are equal, round, and reactive to light.  Right eye exhibits no discharge. Left eye exhibits no discharge. No scleral icterus.  Neck: Normal range of motion. Neck supple. No tracheal deviation present. No thyromegaly present.  Cardiovascular: Normal rate, regular rhythm, normal heart sounds and intact distal pulses.  Exam reveals no gallop and no friction rub.   No murmur heard. Respiratory: Effort normal and breath sounds normal. No respiratory distress. She has no wheezes. She has no rales. She exhibits no tenderness.  GI: Soft. Bowel sounds are normal. She exhibits no distension and no mass. There is tenderness. There is no rebound and no guarding.  Genitourinary:       Vulva is normal without lesions Vagina is pink moist without discharge Cervix normal in appearance and pap is normal Uterus is normal size, contour, position, consistency, mobility, non-tender Adnexa is negative with normal sized ovaries by sonogram  Musculoskeletal: Normal range of motion. She exhibits no edema and no tenderness.  Neurological: She is alert and oriented to person, place, and time. She has normal reflexes. She displays normal reflexes. No  cranial nerve deficit. She exhibits normal muscle tone. Coordination normal.  Skin: Skin is warm and dry. No rash noted. No erythema. No pallor.  Psychiatric: She has a normal mood and affect. Her behavior is normal. Judgment and thought content normal.    Labs: No results found for this or any previous visit (from the past 336 hour(s)).  EKG: Orders placed or performed during the hospital encounter of 06/14/16  . EKG 12-Lead  . EKG 12-Lead  . EKG    Imaging Studies: No results found.    Assessment: Menorrhagia/menometrorrhagia Dysmenorrhea Poorly controlled diabetes Patient Active Problem List   Diagnosis Date Noted  . Urinary frequency 10/29/2016  . Burning with urination 10/29/2016  . Urinary tract infection without hematuria 10/29/2016  . Recurrent acute serous otitis media of right ear 10/29/2016  . Menorrhagia with irregular cycle 10/29/2016  . Uncontrolled type 2 diabetes mellitus with complication ([place]) 39/03/90  . Personal history of noncompliance with medical treatment, presenting hazards to health 05/05/2016  . Essential hypertension, benign 05/05/2016  . Ovarian cyst 02/20/2016  . Diabetes ([place]) 06/05/2015  . Vitamin D deficiency 06/05/2015  . Superficial fungus infection of skin 05/29/2015  . Morbid obesity ([place]) 05/29/2015  . History of diabetes mellitus, type II 05/29/2015  . History of abnormal cervical Pap smear 05/29/2015    Plan: Hysteroscopy D&C endometrial ablation 12/08/2016  I had the patient washed and animated video of the NovaSure endometrial ablation and she says she understands the procedure and all questions were answered    Florian Buff 11/19/2016 10:25 AM     Face to face time:  25 minutes  Greater than 50% of the visit time was spent in counseling and coordination of care with the patient.  The summary and outline of the counseling and care coordination is summarized in the note above.   All questions were  answered.

## 2016-11-29 ENCOUNTER — Inpatient Hospital Stay (HOSPITAL_COMMUNITY)
Admission: EM | Admit: 2016-11-29 | Discharge: 2016-12-03 | DRG: 690 | Disposition: A | Discharge status: Home / Self Care | Payer: Medicaid Other | Attending: Internal Medicine | Admitting: Internal Medicine

## 2016-11-29 ENCOUNTER — Emergency Department (HOSPITAL_COMMUNITY): Payer: Medicaid Other

## 2016-11-29 ENCOUNTER — Other Ambulatory Visit: Payer: Self-pay

## 2016-11-29 ENCOUNTER — Encounter (HOSPITAL_COMMUNITY): Payer: Self-pay

## 2016-11-29 DIAGNOSIS — Z6839 Body mass index (BMI) 39.0-39.9, adult: Secondary | ICD-10-CM

## 2016-11-29 DIAGNOSIS — N920 Excessive and frequent menstruation with regular cycle: Secondary | ICD-10-CM | POA: Diagnosis present

## 2016-11-29 DIAGNOSIS — Z79899 Other long term (current) drug therapy: Secondary | ICD-10-CM

## 2016-11-29 DIAGNOSIS — F319 Bipolar disorder, unspecified: Secondary | ICD-10-CM | POA: Diagnosis present

## 2016-11-29 DIAGNOSIS — N151 Renal and perinephric abscess: Secondary | ICD-10-CM | POA: Diagnosis present

## 2016-11-29 DIAGNOSIS — B3731 Acute candidiasis of vulva and vagina: Secondary | ICD-10-CM

## 2016-11-29 DIAGNOSIS — Z91040 Latex allergy status: Secondary | ICD-10-CM

## 2016-11-29 DIAGNOSIS — Z87891 Personal history of nicotine dependence: Secondary | ICD-10-CM

## 2016-11-29 DIAGNOSIS — R823 Hemoglobinuria: Secondary | ICD-10-CM | POA: Diagnosis present

## 2016-11-29 DIAGNOSIS — N12 Tubulo-interstitial nephritis, not specified as acute or chronic: Secondary | ICD-10-CM

## 2016-11-29 DIAGNOSIS — E118 Type 2 diabetes mellitus with unspecified complications: Secondary | ICD-10-CM

## 2016-11-29 DIAGNOSIS — M810 Age-related osteoporosis without current pathological fracture: Secondary | ICD-10-CM | POA: Diagnosis present

## 2016-11-29 DIAGNOSIS — B373 Candidiasis of vulva and vagina: Secondary | ICD-10-CM | POA: Diagnosis present

## 2016-11-29 DIAGNOSIS — M797 Fibromyalgia: Secondary | ICD-10-CM | POA: Diagnosis present

## 2016-11-29 DIAGNOSIS — R809 Proteinuria, unspecified: Secondary | ICD-10-CM | POA: Diagnosis present

## 2016-11-29 DIAGNOSIS — R81 Glycosuria: Secondary | ICD-10-CM | POA: Diagnosis present

## 2016-11-29 DIAGNOSIS — N10 Acute tubulo-interstitial nephritis: Secondary | ICD-10-CM | POA: Diagnosis present

## 2016-11-29 DIAGNOSIS — N921 Excessive and frequent menstruation with irregular cycle: Secondary | ICD-10-CM | POA: Diagnosis present

## 2016-11-29 DIAGNOSIS — Z23 Encounter for immunization: Secondary | ICD-10-CM

## 2016-11-29 DIAGNOSIS — Z794 Long term (current) use of insulin: Secondary | ICD-10-CM

## 2016-11-29 DIAGNOSIS — E871 Hypo-osmolality and hyponatremia: Secondary | ICD-10-CM | POA: Diagnosis present

## 2016-11-29 DIAGNOSIS — R739 Hyperglycemia, unspecified: Secondary | ICD-10-CM

## 2016-11-29 DIAGNOSIS — I10 Essential (primary) hypertension: Secondary | ICD-10-CM | POA: Diagnosis present

## 2016-11-29 DIAGNOSIS — E1165 Type 2 diabetes mellitus with hyperglycemia: Secondary | ICD-10-CM | POA: Diagnosis present

## 2016-11-29 DIAGNOSIS — F4310 Post-traumatic stress disorder, unspecified: Secondary | ICD-10-CM | POA: Diagnosis present

## 2016-11-29 DIAGNOSIS — F410 Panic disorder [episodic paroxysmal anxiety] without agoraphobia: Secondary | ICD-10-CM | POA: Diagnosis present

## 2016-11-29 LAB — BASIC METABOLIC PANEL
ANION GAP: 9 (ref 5–15)
BUN: 7 mg/dL (ref 6–20)
CALCIUM: 8.6 mg/dL — AB (ref 8.9–10.3)
CHLORIDE: 87 mmol/L — AB (ref 101–111)
CO2: 28 mmol/L (ref 22–32)
Creatinine, Ser: 0.74 mg/dL (ref 0.44–1.00)
GFR calc Af Amer: >60 mL/min (ref >60)
GFR calc non Af Amer: >60 mL/min (ref >60)
GLUCOSE: 451 mg/dL — AB (ref 65–99)
POTASSIUM: 3.9 mmol/L (ref 3.5–5.1)
Sodium: 124 mmol/L — ABNORMAL LOW (ref 135–145)

## 2016-11-29 LAB — CBC WITH DIFFERENTIAL/PLATELET
BASOS PCT: 0 %
Basophils Absolute: 0 10*3/uL (ref 0.0–0.1)
EOS ABS: 0 10*3/uL (ref 0.0–0.7)
EOS PCT: 0 %
HCT: 36.9 % (ref 36.0–46.0)
Hemoglobin: 11.9 g/dL — ABNORMAL LOW (ref 12.0–15.0)
LYMPHS ABS: 3 10*3/uL (ref 0.7–4.0)
Lymphocytes Relative: 17 %
MCH: 27.2 pg (ref 26.0–34.0)
MCHC: 32.2 g/dL (ref 30.0–36.0)
MCV: 84.4 fL (ref 78.0–100.0)
Monocytes Absolute: 1.4 10*3/uL — ABNORMAL HIGH (ref 0.1–1.0)
Monocytes Relative: 8 %
Neutro Abs: 13.4 10*3/uL — ABNORMAL HIGH (ref 1.7–7.7)
Neutrophils Relative %: 75 %
Platelets: 230 10*3/uL (ref 150–400)
RBC: 4.37 MIL/uL (ref 3.87–5.11)
RDW: 14 % (ref 11.5–15.5)
WBC: 17.9 10*3/uL — AB (ref 4.0–10.5)

## 2016-11-29 LAB — URINALYSIS, ROUTINE W REFLEX MICROSCOPIC
BILIRUBIN URINE: NEGATIVE
KETONES UR: NEGATIVE mg/dL
Leukocytes, UA: NEGATIVE
NITRITE: NEGATIVE
Protein, ur: 30 mg/dL — AB
Specific Gravity, Urine: 1.026 (ref 1.005–1.030)
pH: 5 (ref 5.0–8.0)

## 2016-11-29 LAB — PREGNANCY, URINE: PREG TEST UR: NEGATIVE

## 2016-11-29 LAB — LIPASE, BLOOD: Lipase: 19 U/L (ref 11–51)

## 2016-11-29 MED ORDER — ACETAMINOPHEN 325 MG PO TABS
650.0000 mg | ORAL_TABLET | Freq: Once | ORAL | Status: AC | PRN
  Administered 2016-11-29: 650 mg via ORAL
  Filled 2016-11-29: qty 2

## 2016-11-29 MED ORDER — IOPAMIDOL (ISOVUE-300) INJECTION 61%
100.0000 mL | Freq: Once | INTRAVENOUS | Status: AC | PRN
  Administered 2016-11-29: 100 mL via INTRAVENOUS

## 2016-11-29 NOTE — ED Triage Notes (Addendum)
Pt reports left sided abdominal pain that radiates into her back, suprapubic, and vaginal pain since last Wednesday. Pt also reports urinary frequency and urgency. Pt states she has episodes of feeling like she is going to pass out. Pt also reports n/v and decreased appetite.

## 2016-11-29 NOTE — ED Notes (Signed)
Pt states she was being treated for a UTI two weeks ago

## 2016-11-29 NOTE — ED Notes (Signed)
Pt's oxygen saturation in the high 80s and low 90s. Placed pt on 2 L of oxygen [place]

## 2016-11-29 NOTE — ED Provider Notes (Signed)
[facility] EMERGENCY DEPARTMENT Provider Note   CSN: [id number] Arrival date & time: 11/29/16  1824     History   Chief Complaint Chief Complaint  Patient presents with  . Abdominal Pain    HPI Felicia Reynolds is a 36 y.o. female with a history of uncontrolled DM Type II and ESBL E. Coli UTI (2013) who presents to the emergency department with a chief complaint of constant, sharp left lower quadrant pain that radiates to her left low back.  She reports that the pain began 6 days ago.  She reports associated nonbloody, nonbilious emesis that also began 6 days ago with countless episodes of vomiting for the first few days, but no vomiting in the last 24 hours.  She reports 6 episodes of vomiting yesterday.  No vomiting today. She denies nausea, diarrhea, constipation, dysuria, frequency, or vaginal pain.  She reports associated vaginal itching, white vaginal discharge, which she states is chronic.  She denies fever or chills, but states that she was told when she arrived at the emergency department that she was febrile.   Reports that she was seen by her PCP approximately 2 weeks ago and was diagnosed with the UTI.  She was discharged with a week's worth of antibiotics in a "green capsule" (Keflex?), which she was compliant with and finished approximately 1 week ago.  She hss barely eaten anything since the onset of her symptoms.  She reports that she had a little bit of chicken broth earlier today without emesis.  Past medical history includes poorly controlled DM Type II.  She reports that she checked her blood sugar at home this morning and received of a reading of about 500.   The history is provided by the patient. No language interpreter was used.  Abdominal Pain   This is a new problem. The current episode started more than 2 days ago. The problem occurs constantly. The problem has been gradually worsening. The pain is located in the LUQ and LLQ. The pain is severe. Associated  symptoms include fever, nausea, vomiting and dysuria. Pertinent negatives include diarrhea, flatus, hematochezia, melena, constipation, frequency, hematuria, headaches, arthralgias and myalgias. The symptoms are aggravated by certain positions. Nothing relieves the symptoms.    Past Medical History:  Diagnosis Date  . Anxiety   . Arthritis   . Bipolar disorder ([place])   . Diabetes ([place]) 06/05/2015  . Diabetes mellitus   . Fibromyalgia   . History of abnormal cervical Pap smear 05/29/2015  . History of diabetes mellitus, type II 05/29/2015  . Hypertension   . Neuropathy   . Obesity 05/29/2015  . Osteoporosis   . Ovarian cyst 02/20/2016  . Ovarian cyst 02/20/2016   Simple cyst rt ovary  . Panic attacks   . Pneumonia   . Superficial fungus infection of skin 05/29/2015  . Vitamin D deficiency 06/05/2015    Patient Active Problem List   Diagnosis Date Noted  . Urinary frequency 10/29/2016  . Burning with urination 10/29/2016  . Urinary tract infection without hematuria 10/29/2016  . Recurrent acute serous otitis media of right ear 10/29/2016  . Menorrhagia with irregular cycle 10/29/2016  . Uncontrolled type 2 diabetes mellitus with complication ([place]) 60/10/9321  . Personal history of noncompliance with medical treatment, presenting hazards to health 05/05/2016  . Essential hypertension, benign 05/05/2016  . Ovarian cyst 02/20/2016  . Diabetes ([place]) 06/05/2015  . Vitamin D deficiency 06/05/2015  . Superficial fungus infection of skin 05/29/2015  . Morbid obesity ([place])  05/29/2015  . History of diabetes mellitus, type II 05/29/2015  . History of abnormal cervical Pap smear 05/29/2015    Past Surgical History:  Procedure Laterality Date  . CESAREAN SECTION    . HERNIA REPAIR    . THROAT SURGERY      OB History    Gravida Para Term Preterm AB Living   6 1 0 0 5 1   SAB TAB Ectopic Multiple Live Births   4 0 1 0         Home Medications    Prior to Admission medications     Medication Sig Start Date End Date Taking? Authorizing Provider  bismuth subsalicylate (PEPTO BISMOL) 262 MG/15ML suspension Take 30 mLs by mouth daily as needed for indigestion.   Yes [provider]  diclofenac sodium (VOLTAREN) 1 % GEL Apply 2 g topically 4 (four) times daily as needed (pain).    Yes [provider]  DULoxetine (CYMBALTA) 60 MG capsule Take 60 mg by mouth at bedtime.   Yes [provider]  HYDROcodone-acetaminophen (NORCO) 10-325 MG tablet Take 1 tablet by mouth every 8 (eight) hours as needed for moderate pain or severe pain.  11/06/16  Yes [provider]  insulin aspart (NOVOLOG) 100 UNIT/ML injection Inject 10 Units 3 (three) times daily before meals into the skin. 11/19/16  Yes Florian Buff, MD  LANTUS SOLOSTAR 100 UNIT/ML Solostar Pen Inject 60 Units into the skin at bedtime. 06/02/16  Yes Florian Buff, MD  megestrol (MEGACE) 40 MG tablet Take 40 mg by mouth 3 (three) times daily as needed (regulate period).    Yes [provider]  naproxen sodium (ALEVE) 220 MG tablet Take 220 mg by mouth daily as needed (pain).   Yes [provider]  pregabalin (LYRICA) 200 MG capsule Take 200 mg by mouth 3 (three) times daily.    Yes [provider]  tiZANidine (ZANAFLEX) 4 MG tablet Take 4 mg by mouth 3 (three) times daily.   Yes [provider]  traZODone (DESYREL) 100 MG tablet Take 100 mg by mouth at bedtime.   Yes [provider]  cephALEXin (KEFLEX) 500 MG capsule Take 1 capsule (500 mg total) by mouth 4 (four) times daily. Patient not taking: Reported on 11/19/2016 10/29/16   Estill Dooms, NP  ciprofloxacin-dexamethasone (CIPRODEX) OTIC suspension Place 4 drops into the right ear 2 (two) times daily. Patient not taking: Reported on 11/19/2016 10/29/16   Derrek Monaco A, NP  cyclobenzaprine (FLEXERIL) 10 MG tablet TAKE (1) TABLET BY MOUTH TWICE A DAY AS NEEDED. Patient not taking:  Reported on 11/19/2016 11/17/16   Carole Civil, MD  fluconazole (DIFLUCAN) 150 MG tablet Take 1 now and 1 in 3 days Patient not taking: Reported on 11/19/2016 10/29/16   Estill Dooms, NP  Insulin Syringe-Needle U-100 (INSULIN SYRINGE .3CC/29GX1/2") 29G X 1/2" 0.3 ML MISC 1 Syringe 3 (three) times daily by Does not apply route. 11/19/16   Florian Buff, MD  terconazole (TERAZOL 7) 0.4 % vaginal cream Place 1 applicator vaginally at bedtime. Patient not taking: Reported on 11/19/2016 10/29/16   Estill Dooms, NP    Family History Family History  Problem Relation Age of Onset  . Cancer Mother        breast  . Breast cancer Maternal Aunt   . Breast cancer Maternal Grandmother   . Cancer Maternal Aunt     Social History Social History   Tobacco Use  .  Smoking status: Former Smoker    Packs/day: 1.00    Years: 13.00    Pack years: 13.00    Types: Cigarettes    Last attempt to quit: 10/31/2011    Years since quitting: 5.0  . Smokeless tobacco: Never Used  Substance Use Topics  . Alcohol use: No    Alcohol/week: 0.0 oz  . Drug use: No     Allergies   Latex   Review of Systems Review of Systems  Constitutional: Positive for fever. Negative for chills.  HENT: Negative for congestion.   Respiratory: Negative for shortness of breath.   Cardiovascular: Negative for chest pain.  Gastrointestinal: Positive for abdominal pain, nausea and vomiting. Negative for constipation, diarrhea, flatus, hematochezia and melena.  Genitourinary: Positive for dysuria and flank pain. Negative for frequency and hematuria.  Musculoskeletal: Positive for back pain. Negative for arthralgias, myalgias and neck pain.  Skin: Negative for rash.  Allergic/Immunologic: Positive for immunocompromised state.  Neurological: Negative for dizziness, syncope, light-headedness and headaches.  Psychiatric/Behavioral: Negative for confusion.     Physical Exam Updated Vital Signs BP  122/69   Pulse 94   Temp 98.2 F (36.8 C) (Oral)   Resp 18   Ht 5\' 6"  (1.676 m)   Wt 97.5 kg (215 lb)   SpO2 95%   BMI 34.70 kg/m   Physical Exam  Constitutional: No distress. Nasal cannula in place.  HENT:  Head: Normocephalic.  Eyes: Conjunctivae are normal.  Neck: Neck supple.  Cardiovascular: Normal rate and regular rhythm. Exam reveals no gallop and no friction rub.  No murmur heard. Pulmonary/Chest: Effort normal and breath sounds normal. No stridor. No respiratory distress. She has no wheezes. She has no rhonchi. She has no rales. She exhibits no tenderness.  Abdominal: Soft. She exhibits no distension. There is no hepatosplenomegaly, splenomegaly or hepatomegaly. There is tenderness in the left upper quadrant and left lower quadrant. There is guarding and CVA tenderness (left). There is no rigidity, no rebound, no tenderness at McBurney's point and negative Murphy's sign. No hernia. Hernia confirmed negative in the ventral area, confirmed negative in the right inguinal area and confirmed negative in the left inguinal area.  Obese abdomen.  Normoactive bowel sounds in all 4 quadrants.  No overlying erythema, edema, or warmth of the abdomen.  No rashes.  Musculoskeletal: She exhibits tenderness. She exhibits no edema or deformity.  No tenderness to palpation of the cervical, thoracic, or lumbar spinous processes.  Neurological: She is alert.  Skin: Skin is warm. No rash noted.  Psychiatric: Her behavior is normal.  Nursing note and vitals reviewed.  ED Treatments / Results  Labs (all labs ordered are listed, but only abnormal results are displayed) Labs Reviewed  CBC WITH DIFFERENTIAL/PLATELET - Abnormal; Notable for the following components:      Result Value   WBC 17.9 (*)    Hemoglobin 11.9 (*)    Neutro Abs 13.4 (*)    Monocytes Absolute 1.4 (*)    All other components within normal limits  BASIC METABOLIC PANEL - Abnormal; Notable for the following components:    Sodium 124 (*)    Chloride 87 (*)    Glucose, Bld 451 (*)    Calcium 8.6 (*)    All other components within normal limits  URINALYSIS, ROUTINE W REFLEX MICROSCOPIC - Abnormal; Notable for the following components:   Glucose, UA >=500 (*)    Hgb urine dipstick SMALL (*)    Protein, ur 30 (*)  Bacteria, UA RARE (*)    Squamous Epithelial / LPF 0-5 (*)    All other components within normal limits  CBG MONITORING, ED - Abnormal; Notable for the following components:   Glucose-Capillary 317 (*)    All other components within normal limits  CULTURE, BLOOD (ROUTINE X 2)  CULTURE, BLOOD (ROUTINE X 2)  URINE CULTURE  LIPASE, BLOOD  PREGNANCY, URINE  PHOSPHORUS  MAGNESIUM    EKG  EKG Interpretation None       Radiology Ct Abdomen Pelvis W Contrast  Result Date: 11/30/2016 CLINICAL DATA:  Left-sided abdominal pain. EXAM: CT ABDOMEN AND PELVIS WITH CONTRAST TECHNIQUE: Multidetector CT imaging of the abdomen and pelvis was performed using the standard protocol following bolus administration of intravenous contrast. CONTRAST:  154mL ISOVUE-300 IOPAMIDOL (ISOVUE-300) INJECTION 61% COMPARISON:  CT 08/15/2012 FINDINGS: Lower chest: Atelectasis and trace pleural effusion noted at the left lung base, likely reactive. Hepatobiliary: The liver is enlarged spanning 23 cm cranial caudal. No focal lesion. Layering sludge or stones in the gallbladder, no pericholecystic inflammation. No biliary dilatation. Pancreas: No ductal dilatation or inflammation. Spleen: Splenomegaly measuring 15.5 cm craniocaudal. Adrenals/Urinary Tract: 2.1 cm right adrenal nodule is low-density and consistent with adenoma. No left adrenal nodule. Significant left perinephric edema with heterogeneous subcapsular fluid collection causing mild mass-effect on the renal parenchyma. Fluid collection measures up to 2.2 cm in depth posteriorly. There is are some enhancing septations. No evidence of intrarenal fluid collection.  Inflammatory stranding tracks along the course the left ureter into the pelvis. Heterogeneity of the renal parenchyma on delayed phase imaging, was ill-defined low density in the lower pole renal parenchyma. No hydronephrosis. Homogeneous enhancement of the right kidney. Urinary bladder is physiologically distended, small amount of air in the nondependent bladder. No air in the bladder wall. Stomach/Bowel: Stomach is within normal limits. Appendix appears normal. No evidence of bowel wall thickening, distention, or inflammatory changes. No significant diverticular disease. Vascular/Lymphatic: Prominent retroperitoneal nodes at the level of the left kidney are likely reactive. No pelvic adenopathy. Trace aorta bi-iliac atherosclerosis. Reproductive: Bullous appearance of the uterus. Unremarkable adnexa. Other: No free air. No free fluid at this and perinephric edema. 2.5 cm subcutaneous cyst in the right lateral upper abdominal wall, likely a sebaceous cyst, minimal increase from prior exam. Subcutaneous calcification of the right lower wall pannus. Musculoskeletal: There are no acute or suspicious osseous abnormalities. IMPRESSION: 1. Edematous left kidney with circumferential subcapsular heterogeneous collection measuring up to 2.2 cm. Some heterogeneity of left renal parenchyma with ill-defined low density in the lower pole. Findings are suspicious for a subcapsular abscess and pyelonephritis. Subcapsular hematoma is considered but felt less likely. 2. Hepatosplenomegaly. Sludge or noncalcified stones in the gallbladder without gallbladder inflammation. 3. Mild aorta bi-iliac atherosclerosis, age advanced. Electronically Signed   By: Jeb Levering M.D.   On: 11/30/2016 00:23    Procedures Procedures (including critical care time)  Medications Ordered in ED Medications  piperacillin-tazobactam (ZOSYN) IVPB 3.375 g (3.375 g Intravenous New Bag/Given 11/30/16 0128)  sodium chloride 0.9 % bolus 1,000 mL  (not administered)  ketorolac (TORADOL) 30 MG/ML injection 30 mg (not administered)  insulin aspart (novoLOG) injection 6 Units (not administered)  acetaminophen (TYLENOL) tablet 650 mg (650 mg Oral Given 11/29/16 2208)  iopamidol (ISOVUE-300) 61 % injection 100 mL (100 mLs Intravenous Contrast Given 11/29/16 2344)  sodium chloride 0.9 % bolus 1,000 mL (1,000 mLs Intravenous New Bag/Given 11/30/16 0045)     Initial Impression / Assessment and Plan /  ED Course  I have reviewed the triage vital signs and the nursing notes.  Pertinent labs & imaging results that were available during my care of the patient were reviewed by me and considered in my medical decision making (see chart for details).     36 year old female with a history of poorly controlled type 2 diabetes mellitus and UTI with ESBL E. coli on culture in 2013 presenting with abdominal and flank pain and fever.  She also had emesis and nausea which has since resolved ~24 hours ago.  On physical exam, the patient is exquisitely tender to palpation over the left upper and lower quadrants with left CVA tenderness. Tmax 101.7 in the ED, improving to 98.2 with Tylenol. Tachycardic in the 110s, improving to 90s with fluids. No hypotension. WBC 18. Glu 451, improving to 317 after she is partially through the first fluid bolus. NA 124, with corrected NA 132 adjusting for hyperglycemia. No anion gap. UA with >500 glu, small proteinuria, and + for budding yeast. Urine culture pending and ordered prior to initiation of ABX.  The patient finished a 7-day course of antibiotics, likely Keflex (green capsules), proximally 1 week ago for UTI.  CT abdomen pelvis demonstrating significant left perinephric edema with heterogeneous subcapsular fluid collection, approximately 2.2 cm in depth, posteriorly with some enhancing septations, concerning for perinephric abscess.  Inflammatory stranding tracks along the course of the left ureter into the pelvis.  No  intrarenal fluid collection noted.  Hepatosplenomegaly is also noted.   Discussed the patient with Dr. Christy Gentles, attending physician.  Zosyn ordered per pharmacy in the ED and initiated after blood cultures x2 were drawn.  Consulted Dr. Olevia Bowens with the hospitalist team who will admit the patient for further workup and treatment for perinephric abscess and pyelonephritis.  Discussed the findings of the budding yeast on the urinalysis and the patient's history of chronic vaginal discharge and itching.  Concern for Candida being the likely source of the pyelonephritis. Discussed with Dr. Olevia Bowens that she will likely need an antifungal, but that she also has a h/o of ESBL E. Coli infection in 2013. Most recently urine culture from 10/18 was pan-sensitive. The patient appears reasonably stabilized for admission considering the current resources, flow, and capabilities available in the ED at this time, and I doubt any other [facility] requiring further screening and/or treatment in the ED prior to admission.  Final Clinical Impressions(s) / ED Diagnoses   Final diagnoses:  Pyelonephritis  Perinephric abscess  Hyperglycemia    ED Discharge Orders    None       Joanne Gavel, PA-C 11/30/16 Colp, Donald, MD 11/30/16 [phone]

## 2016-11-30 ENCOUNTER — Encounter (HOSPITAL_COMMUNITY): Payer: Self-pay | Admitting: Internal Medicine

## 2016-11-30 ENCOUNTER — Other Ambulatory Visit: Payer: Self-pay

## 2016-11-30 DIAGNOSIS — N920 Excessive and frequent menstruation with regular cycle: Secondary | ICD-10-CM | POA: Diagnosis present

## 2016-11-30 DIAGNOSIS — Z23 Encounter for immunization: Secondary | ICD-10-CM | POA: Diagnosis not present

## 2016-11-30 DIAGNOSIS — F410 Panic disorder [episodic paroxysmal anxiety] without agoraphobia: Secondary | ICD-10-CM | POA: Diagnosis present

## 2016-11-30 DIAGNOSIS — E1165 Type 2 diabetes mellitus with hyperglycemia: Secondary | ICD-10-CM | POA: Diagnosis present

## 2016-11-30 DIAGNOSIS — Z91040 Latex allergy status: Secondary | ICD-10-CM | POA: Diagnosis not present

## 2016-11-30 DIAGNOSIS — Z6839 Body mass index (BMI) 39.0-39.9, adult: Secondary | ICD-10-CM | POA: Diagnosis not present

## 2016-11-30 DIAGNOSIS — B3731 Acute candidiasis of vulva and vagina: Secondary | ICD-10-CM

## 2016-11-30 DIAGNOSIS — Z79899 Other long term (current) drug therapy: Secondary | ICD-10-CM | POA: Diagnosis not present

## 2016-11-30 DIAGNOSIS — F4310 Post-traumatic stress disorder, unspecified: Secondary | ICD-10-CM | POA: Diagnosis present

## 2016-11-30 DIAGNOSIS — R809 Proteinuria, unspecified: Secondary | ICD-10-CM | POA: Diagnosis present

## 2016-11-30 DIAGNOSIS — N10 Acute tubulo-interstitial nephritis: Principal | ICD-10-CM

## 2016-11-30 DIAGNOSIS — I10 Essential (primary) hypertension: Secondary | ICD-10-CM | POA: Diagnosis present

## 2016-11-30 DIAGNOSIS — R81 Glycosuria: Secondary | ICD-10-CM | POA: Diagnosis present

## 2016-11-30 DIAGNOSIS — R823 Hemoglobinuria: Secondary | ICD-10-CM | POA: Diagnosis present

## 2016-11-30 DIAGNOSIS — E871 Hypo-osmolality and hyponatremia: Secondary | ICD-10-CM | POA: Diagnosis present

## 2016-11-30 DIAGNOSIS — N151 Renal and perinephric abscess: Secondary | ICD-10-CM | POA: Diagnosis not present

## 2016-11-30 DIAGNOSIS — F319 Bipolar disorder, unspecified: Secondary | ICD-10-CM | POA: Diagnosis present

## 2016-11-30 DIAGNOSIS — M810 Age-related osteoporosis without current pathological fracture: Secondary | ICD-10-CM | POA: Diagnosis present

## 2016-11-30 DIAGNOSIS — N12 Tubulo-interstitial nephritis, not specified as acute or chronic: Secondary | ICD-10-CM

## 2016-11-30 DIAGNOSIS — M797 Fibromyalgia: Secondary | ICD-10-CM | POA: Diagnosis present

## 2016-11-30 DIAGNOSIS — B373 Candidiasis of vulva and vagina: Secondary | ICD-10-CM | POA: Diagnosis present

## 2016-11-30 DIAGNOSIS — E118 Type 2 diabetes mellitus with unspecified complications: Secondary | ICD-10-CM | POA: Diagnosis not present

## 2016-11-30 DIAGNOSIS — Z794 Long term (current) use of insulin: Secondary | ICD-10-CM | POA: Diagnosis not present

## 2016-11-30 DIAGNOSIS — Z87891 Personal history of nicotine dependence: Secondary | ICD-10-CM | POA: Diagnosis not present

## 2016-11-30 LAB — COMPREHENSIVE METABOLIC PANEL
ALT: 12 U/L — AB (ref 14–54)
AST: 16 U/L (ref 15–41)
Albumin: 2.2 g/dL — ABNORMAL LOW (ref 3.5–5.0)
Alkaline Phosphatase: 147 U/L — ABNORMAL HIGH (ref 38–126)
Anion gap: 8 (ref 5–15)
BUN: 11 mg/dL (ref 6–20)
CALCIUM: 8.4 mg/dL — AB (ref 8.9–10.3)
CHLORIDE: 93 mmol/L — AB (ref 101–111)
CO2: 27 mmol/L (ref 22–32)
CREATININE: 0.88 mg/dL (ref 0.44–1.00)
Glucose, Bld: 346 mg/dL — ABNORMAL HIGH (ref 65–99)
Potassium: 3.8 mmol/L (ref 3.5–5.1)
Sodium: 128 mmol/L — ABNORMAL LOW (ref 135–145)
Total Bilirubin: 0.7 mg/dL (ref 0.3–1.2)
Total Protein: 6.3 g/dL — ABNORMAL LOW (ref 6.5–8.1)

## 2016-11-30 LAB — CBC WITH DIFFERENTIAL/PLATELET
Basophils Absolute: 0 10*3/uL (ref 0.0–0.1)
Basophils Relative: 0 %
EOS ABS: 0.1 10*3/uL (ref 0.0–0.7)
EOS PCT: 1 %
HCT: 33.9 % — ABNORMAL LOW (ref 36.0–46.0)
Hemoglobin: 10.9 g/dL — ABNORMAL LOW (ref 12.0–15.0)
LYMPHS ABS: 2.5 10*3/uL (ref 0.7–4.0)
Lymphocytes Relative: 16 %
MCH: 27.1 pg (ref 26.0–34.0)
MCHC: 32.2 g/dL (ref 30.0–36.0)
MCV: 84.3 fL (ref 78.0–100.0)
MONO ABS: 1.2 10*3/uL — AB (ref 0.1–1.0)
MONOS PCT: 8 %
Neutro Abs: 11.6 10*3/uL — ABNORMAL HIGH (ref 1.7–7.7)
Neutrophils Relative %: 75 %
PLATELETS: 221 10*3/uL (ref 150–400)
RBC: 4.02 MIL/uL (ref 3.87–5.11)
RDW: 14.1 % (ref 11.5–15.5)
WBC: 15.4 10*3/uL — ABNORMAL HIGH (ref 4.0–10.5)

## 2016-11-30 LAB — GLUCOSE, CAPILLARY
GLUCOSE-CAPILLARY: 265 mg/dL — AB (ref 65–99)
Glucose-Capillary: 277 mg/dL — ABNORMAL HIGH (ref 65–99)
Glucose-Capillary: 327 mg/dL — ABNORMAL HIGH (ref 65–99)
Glucose-Capillary: 150 mg/dL — ABNORMAL HIGH (ref 65–99)
Glucose-Capillary: 141 mg/dL — ABNORMAL HIGH (ref 65–99)

## 2016-11-30 LAB — PHOSPHORUS: Phosphorus: 2.8 mg/dL (ref 2.5–4.6)

## 2016-11-30 LAB — MAGNESIUM: Magnesium: 1.7 mg/dL (ref 1.7–2.4)

## 2016-11-30 LAB — HEMOGLOBIN A1C
HEMOGLOBIN A1C: 13.4 % — AB (ref 4.8–5.6)
MEAN PLASMA GLUCOSE: 337.88 mg/dL

## 2016-11-30 LAB — CBG MONITORING, ED: GLUCOSE-CAPILLARY: 317 mg/dL — AB (ref 65–99)

## 2016-11-30 MED ORDER — PREGABALIN 75 MG PO CAPS
200.0000 mg | ORAL_CAPSULE | Freq: Three times a day (TID) | ORAL | Status: DC
  Administered 2016-11-30 – 2016-12-03 (×10): 200 mg via ORAL
  Filled 2016-11-30 (×10): qty 1

## 2016-11-30 MED ORDER — INSULIN ASPART 100 UNIT/ML ~~LOC~~ SOLN
6.0000 [IU] | Freq: Once | SUBCUTANEOUS | Status: AC
  Administered 2016-11-30: 6 [IU] via SUBCUTANEOUS
  Filled 2016-11-30: qty 1

## 2016-11-30 MED ORDER — DULOXETINE HCL 30 MG PO CPEP
60.0000 mg | ORAL_CAPSULE | Freq: Every day | ORAL | Status: DC
  Administered 2016-11-30 – 2016-12-02 (×3): 60 mg via ORAL
  Filled 2016-11-30 (×3): qty 2

## 2016-11-30 MED ORDER — PNEUMOCOCCAL VAC POLYVALENT 25 MCG/0.5ML IJ INJ
0.5000 mL | INJECTION | INTRAMUSCULAR | Status: AC
  Administered 2016-12-01: 0.5 mL via INTRAMUSCULAR
  Filled 2016-11-30: qty 0.5

## 2016-11-30 MED ORDER — INSULIN GLARGINE 100 UNIT/ML ~~LOC~~ SOLN
60.0000 [IU] | Freq: Every day | SUBCUTANEOUS | Status: DC
  Filled 2016-11-30: qty 0.6

## 2016-11-30 MED ORDER — ACETAMINOPHEN 325 MG PO TABS
650.0000 mg | ORAL_TABLET | ORAL | Status: DC | PRN
  Administered 2016-11-30: 650 mg via ORAL
  Filled 2016-11-30: qty 2

## 2016-11-30 MED ORDER — HYDROCODONE-ACETAMINOPHEN 10-325 MG PO TABS
1.0000 | ORAL_TABLET | Freq: Three times a day (TID) | ORAL | Status: DC | PRN
  Administered 2016-11-30 – 2016-12-01 (×3): 1 via ORAL
  Filled 2016-11-30 (×3): qty 1

## 2016-11-30 MED ORDER — HYDROMORPHONE HCL 1 MG/ML IJ SOLN
1.0000 mg | Freq: Once | INTRAMUSCULAR | Status: DC
  Filled 2016-11-30: qty 1

## 2016-11-30 MED ORDER — PIPERACILLIN-TAZOBACTAM 3.375 G IVPB 30 MIN: 3.3750 g | Freq: Three times a day (TID) | INTRAVENOUS | Status: DC

## 2016-11-30 MED ORDER — FLUCONAZOLE 100 MG PO TABS
150.0000 mg | ORAL_TABLET | Freq: Every day | ORAL | Status: DC
  Administered 2016-11-30 – 2016-12-03 (×4): 150 mg via ORAL
  Filled 2016-11-30 (×4): qty 2

## 2016-11-30 MED ORDER — SODIUM CHLORIDE 0.9 % IV BOLUS (SEPSIS)
1000.0000 mL | Freq: Once | INTRAVENOUS | Status: AC
  Administered 2016-11-30: 1000 mL via INTRAVENOUS

## 2016-11-30 MED ORDER — HEPARIN SODIUM (PORCINE) 5000 UNIT/ML IJ SOLN
5000.0000 [IU] | Freq: Three times a day (TID) | INTRAMUSCULAR | Status: DC
  Administered 2016-11-30 – 2016-12-02 (×8): 5000 [IU] via SUBCUTANEOUS
  Filled 2016-11-30 (×10): qty 1

## 2016-11-30 MED ORDER — PIPERACILLIN-TAZOBACTAM 3.375 G IVPB
3.3750 g | Freq: Once | INTRAVENOUS | Status: AC
  Administered 2016-11-30: 3.375 g via INTRAVENOUS
  Filled 2016-11-30: qty 50

## 2016-11-30 MED ORDER — INSULIN GLARGINE 100 UNIT/ML ~~LOC~~ SOLN
66.0000 [IU] | Freq: Every day | SUBCUTANEOUS | Status: DC
  Administered 2016-11-30 – 2016-12-03 (×4): 66 [IU] via SUBCUTANEOUS
  Filled 2016-11-30 (×7): qty 0.66

## 2016-11-30 MED ORDER — SODIUM CHLORIDE 0.9 % IV SOLN
INTRAVENOUS | Status: DC
  Administered 2016-11-30 – 2016-12-02 (×7): via INTRAVENOUS

## 2016-11-30 MED ORDER — TIZANIDINE HCL 4 MG PO TABS: 4.0000 mg | ORAL_TABLET | Freq: Three times a day (TID) | ORAL | Status: DC | PRN

## 2016-11-30 MED ORDER — KETOROLAC TROMETHAMINE 30 MG/ML IJ SOLN
30.0000 mg | Freq: Once | INTRAMUSCULAR | Status: AC
  Administered 2016-11-30: 30 mg via INTRAVENOUS
  Filled 2016-11-30: qty 1

## 2016-11-30 MED ORDER — ONDANSETRON HCL 4 MG/2ML IJ SOLN
4.0000 mg | Freq: Four times a day (QID) | INTRAMUSCULAR | Status: DC | PRN
Start: 2016-11-30 — End: 2016-12-03

## 2016-11-30 MED ORDER — INSULIN ASPART 100 UNIT/ML ~~LOC~~ SOLN
0.0000 [IU] | Freq: Three times a day (TID) | SUBCUTANEOUS | Status: DC
  Administered 2016-11-30: 3 [IU] via SUBCUTANEOUS
  Administered 2016-11-30: 11 [IU] via SUBCUTANEOUS
  Administered 2016-11-30: 15 [IU] via SUBCUTANEOUS
  Administered 2016-12-01 – 2016-12-02 (×3): 4 [IU] via SUBCUTANEOUS
  Administered 2016-12-03: 11 [IU] via SUBCUTANEOUS

## 2016-11-30 MED ORDER — INSULIN ASPART 100 UNIT/ML ~~LOC~~ SOLN
20.0000 [IU] | Freq: Three times a day (TID) | SUBCUTANEOUS | Status: DC
  Administered 2016-11-30 – 2016-12-03 (×8): 20 [IU] via SUBCUTANEOUS

## 2016-11-30 MED ORDER — PIPERACILLIN-TAZOBACTAM 3.375 G IVPB
3.3750 g | Freq: Three times a day (TID) | INTRAVENOUS | Status: DC
  Administered 2016-11-30 – 2016-12-01 (×4): 3.375 g via INTRAVENOUS
  Filled 2016-11-30 (×3): qty 50

## 2016-11-30 NOTE — Consult Note (Signed)
Urology Consult   Physician requesting consult: Irwin Brakeman, MD  Reason for consult: Urinary tract infection, radiographic abnormality of left kidney  History of Present Illness: Felicia Reynolds is a 36 y.o. female with multiple medical issues including diabetes mellitus not well controlled, bipolar disorder, fibromyalgia, hypertension, osteoporosis, panic attacks, admitted yesterday for management of a probable UTI as well as significant perinephric fluid collection.  She became sick about 7 days ago while walking in a mall in [place], [place].  She had sudden left flank pain, nausea, vomiting.  This persisted over the past several days, and worsened yesterday.  She also had fever.  She was treated for urinary tract infection that was asymptomatic about 3  weeks ago.  She took cephalexin for that.  She has had intermittent urinary tract infections through the years.  Recently, with her above-mentioned symptoms, she also had foul-smelling urine.  She denies any gross hematuria.  She denies any right-sided pain.  Upon emergency room evaluation, she was found to have significant left perinephric edema progressing down the ureter.  There was no evidence of renal mass or hydronephrosis.  There is no history of kidney stones.  .  Past Medical History:  Diagnosis Date  . Anxiety   . Arthritis   . Bipolar disorder ([place])   . Diabetes ([place]) 06/05/2015  . Diabetes mellitus   . Fibromyalgia   . History of abnormal cervical Pap smear 05/29/2015  . History of diabetes mellitus, type II 05/29/2015  . Hypertension   . Neuropathy   . Obesity 05/29/2015  . Osteoporosis   . Ovarian cyst 02/20/2016  . Ovarian cyst 02/20/2016   Simple cyst rt ovary  . Panic attacks   . Pneumonia   . Superficial fungus infection of skin 05/29/2015  . Vitamin D deficiency 06/05/2015    Past Surgical History:  Procedure Laterality Date  . CESAREAN SECTION    . HERNIA REPAIR    . THROAT SURGERY       Current  Hospital Medications: Scheduled Meds: . DULoxetine  60 mg Oral QHS  . fluconazole  150 mg Oral Daily  . heparin  5,000 Units Subcutaneous Q8H  .  HYDROmorphone (DILAUDID) injection  1 mg Intravenous Once  . insulin aspart  0-20 Units Subcutaneous TID WC  . insulin aspart  20 Units Subcutaneous TID WC  . insulin glargine  66 Units Subcutaneous Daily  . [START ON 12/01/2016] pneumococcal 23 valent vaccine  0.5 mL Intramuscular Tomorrow-1000  . pregabalin  200 mg Oral TID   Continuous Infusions: . sodium chloride 125 mL/hr at 11/30/16 1104  . piperacillin-tazobactam (ZOSYN)  IV Stopped (11/30/16 1804)   PRN Meds:.HYDROcodone-acetaminophen, ondansetron (ZOFRAN) IV, tiZANidine  Allergies:  Allergies  Allergen Reactions  . Latex Itching and Rash    Family History  Problem Relation Age of Onset  . Cancer Mother        breast  . Breast cancer Maternal Aunt   . Breast cancer Maternal Grandmother   . Cancer Maternal Aunt     Social History:  reports that she quit smoking about 5 years ago. Her smoking use included cigarettes. She has a 13.00 pack-year smoking history. she has never used smokeless tobacco. She reports that she does not drink alcohol or use drugs.  ROS: A complete review of systems was performed.  All systems are negative except for pertinent findings as noted.  Physical Exam:  Vital signs in last 24 hours: Temp:  [98.2 F (36.8 C)-101.7 F (38.7  C)] 99.8 F (37.7 C) (11/27 1610) Pulse Rate:  [88-117] 104 (11/27 1610) Resp:  [18-23] 18 (11/27 1610) BP: (104-148)/(59-85) 114/59 (11/27 1610) SpO2:  [92 %-100 %] 96 % (11/27 1610) FiO2 (%):  [21 %] 21 % (11/27 0321) Weight:  [97.5 kg (215 lb)-111.2 kg (245 lb 1.6 oz)] 111.2 kg (245 lb 1.6 oz) (11/27 0344) General:  Alert and oriented, No acute distress.  Obese HEENT: Normocephalic, atraumatic Neck: No JVD or lymphadenopathy Cardiovascular: Regular rate and rhythm Lungs: Normal inspiratory and expiratory  excursion Abdomen: Obese, soft, left upper and lower quadrant tenderness.  No rebound, mass or guarding.  Full exam precluded secondary to obesity. Back: Mild left CVA tenderness Extremities: No edema Neurologic: Grossly intact  Laboratory Data:  Recent Labs    11/29/16 2025 11/30/16 0756  WBC 17.9* 15.4*  HGB 11.9* 10.9*  HCT 36.9 33.9*  PLT 230 221    Recent Labs    11/29/16 2025 11/30/16 0756  NA 124* 128*  K 3.9 3.8  CL 87* 93*  GLUCOSE 451* 346*  BUN 7 11  CALCIUM 8.6* 8.4*  CREATININE 0.74 0.88     Results for orders placed or performed during the hospital encounter of 11/29/16 (from the past 24 hour(s))  CBC with Differential     Status: Abnormal   Collection Time: 11/29/16  8:25 PM  Result Value Ref Range   WBC 17.9 (H) 4.0 - 10.5 K/uL   RBC 4.37 3.87 - 5.11 MIL/uL   Hemoglobin 11.9 (L) 12.0 - 15.0 g/dL   HCT 36.9 36.0 - 46.0 %   MCV 84.4 78.0 - 100.0 fL   MCH 27.2 26.0 - 34.0 pg   MCHC 32.2 30.0 - 36.0 g/dL   RDW 14.0 11.5 - 15.5 %   Platelets 230 150 - 400 K/uL   Neutrophils Relative % 75 %   Neutro Abs 13.4 (H) 1.7 - 7.7 K/uL   Lymphocytes Relative 17 %   Lymphs Abs 3.0 0.7 - 4.0 K/uL   Monocytes Relative 8 %   Monocytes Absolute 1.4 (H) 0.1 - 1.0 K/uL   Eosinophils Relative 0 %   Eosinophils Absolute 0.0 0.0 - 0.7 K/uL   Basophils Relative 0 %   Basophils Absolute 0.0 0.0 - 0.1 K/uL  Basic metabolic panel     Status: Abnormal   Collection Time: 11/29/16  8:25 PM  Result Value Ref Range   Sodium 124 (L) 135 - 145 mmol/L   Potassium 3.9 3.5 - 5.1 mmol/L   Chloride 87 (L) 101 - 111 mmol/L   CO2 28 22 - 32 mmol/L   Glucose, Bld 451 (H) 65 - 99 mg/dL   BUN 7 6 - 20 mg/dL   Creatinine, Ser 0.74 0.44 - 1.00 mg/dL   Calcium 8.6 (L) 8.9 - 10.3 mg/dL   GFR calc non Af Amer >60 >60 mL/min   GFR calc Af Amer >60 >60 mL/min   Anion gap 9 5 - 15  Lipase, blood     Status: None   Collection Time: 11/29/16  8:25 PM  Result Value Ref Range   Lipase 19  11 - 51 U/L  Phosphorus     Status: None   Collection Time: 11/29/16  8:25 PM  Result Value Ref Range   Phosphorus 2.8 2.5 - 4.6 mg/dL  Magnesium     Status: None   Collection Time: 11/29/16  8:25 PM  Result Value Ref Range   Magnesium 1.7 1.7 - 2.4 mg/dL  Urinalysis, Routine w reflex microscopic     Status: Abnormal   Collection Time: 11/29/16  8:55 PM  Result Value Ref Range   Color, Urine YELLOW YELLOW   APPearance CLEAR CLEAR   Specific Gravity, Urine 1.026 1.005 - 1.030   pH 5.0 5.0 - 8.0   Glucose, UA >=500 (A) NEGATIVE mg/dL   Hgb urine dipstick SMALL (A) NEGATIVE   Bilirubin Urine NEGATIVE NEGATIVE   Ketones, ur NEGATIVE NEGATIVE mg/dL   Protein, ur 30 (A) NEGATIVE mg/dL   Nitrite NEGATIVE NEGATIVE   Leukocytes, UA NEGATIVE NEGATIVE   RBC / HPF 0-5 0 - 5 RBC/hpf   WBC, UA 0-5 0 - 5 WBC/hpf   Bacteria, UA RARE (A) NONE SEEN   Squamous Epithelial / LPF 0-5 (A) NONE SEEN   Budding Yeast PRESENT   Pregnancy, urine     Status: None   Collection Time: 11/29/16  8:55 PM  Result Value Ref Range   Preg Test, Ur NEGATIVE NEGATIVE  Blood culture (routine x 2)     Status: None (Preliminary result)   Collection Time: 11/30/16 12:44 AM  Result Value Ref Range   Specimen Description BLOOD LEFT ARM    Special Requests      BOTTLES DRAWN AEROBIC AND ANAEROBIC Blood Culture adequate volume   Culture NO GROWTH < 12 HOURS    Report Status PENDING   Blood culture (routine x 2)     Status: None (Preliminary result)   Collection Time: 11/30/16 12:53 AM  Result Value Ref Range   Specimen Description BLOOD LEFT ARM    Special Requests      BOTTLES DRAWN AEROBIC AND ANAEROBIC Blood Culture adequate volume   Culture NO GROWTH < 12 HOURS    Report Status PENDING   CBG monitoring, ED     Status: Abnormal   Collection Time: 11/30/16  1:20 AM  Result Value Ref Range   Glucose-Capillary 317 (H) 65 - 99 mg/dL  Glucose, capillary     Status: Abnormal   Collection Time: 11/30/16  3:49 AM   Result Value Ref Range   Glucose-Capillary 277 (H) 65 - 99 mg/dL  Glucose, capillary     Status: Abnormal   Collection Time: 11/30/16  7:37 AM  Result Value Ref Range   Glucose-Capillary 327 (H) 65 - 99 mg/dL   Comment 1 Notify RN   CBC with Differential/Platelet     Status: Abnormal   Collection Time: 11/30/16  7:56 AM  Result Value Ref Range   WBC 15.4 (H) 4.0 - 10.5 K/uL   RBC 4.02 3.87 - 5.11 MIL/uL   Hemoglobin 10.9 (L) 12.0 - 15.0 g/dL   HCT 33.9 (L) 36.0 - 46.0 %   MCV 84.3 78.0 - 100.0 fL   MCH 27.1 26.0 - 34.0 pg   MCHC 32.2 30.0 - 36.0 g/dL   RDW 14.1 11.5 - 15.5 %   Platelets 221 150 - 400 K/uL   Neutrophils Relative % 75 %   Neutro Abs 11.6 (H) 1.7 - 7.7 K/uL   Lymphocytes Relative 16 %   Lymphs Abs 2.5 0.7 - 4.0 K/uL   Monocytes Relative 8 %   Monocytes Absolute 1.2 (H) 0.1 - 1.0 K/uL   Eosinophils Relative 1 %   Eosinophils Absolute 0.1 0.0 - 0.7 K/uL   Basophils Relative 0 %   Basophils Absolute 0.0 0.0 - 0.1 K/uL   WBC Morphology MILD LEFT SHIFT (1-5% METAS, OCC MYELO, OCC BANDS)   Comprehensive metabolic  panel     Status: Abnormal   Collection Time: 11/30/16  7:56 AM  Result Value Ref Range   Sodium 128 (L) 135 - 145 mmol/L   Potassium 3.8 3.5 - 5.1 mmol/L   Chloride 93 (L) 101 - 111 mmol/L   CO2 27 22 - 32 mmol/L   Glucose, Bld 346 (H) 65 - 99 mg/dL   BUN 11 6 - 20 mg/dL   Creatinine, Ser 0.88 0.44 - 1.00 mg/dL   Calcium 8.4 (L) 8.9 - 10.3 mg/dL   Total Protein 6.3 (L) 6.5 - 8.1 g/dL   Albumin 2.2 (L) 3.5 - 5.0 g/dL   AST 16 15 - 41 U/L   ALT 12 (L) 14 - 54 U/L   Alkaline Phosphatase 147 (H) 38 - 126 U/L   Total Bilirubin 0.7 0.3 - 1.2 mg/dL   GFR calc non Af Amer >60 >60 mL/min   GFR calc Af Amer >60 >60 mL/min   Anion gap 8 5 - 15  Hemoglobin A1c     Status: Abnormal   Collection Time: 11/30/16  7:56 AM  Result Value Ref Range   Hgb A1c MFr Bld 13.4 (H) 4.8 - 5.6 %   Mean Plasma Glucose 337.88 mg/dL  Glucose, capillary     Status: Abnormal    Collection Time: 11/30/16 11:50 AM  Result Value Ref Range   Glucose-Capillary 265 (H) 65 - 99 mg/dL  Glucose, capillary     Status: Abnormal   Collection Time: 11/30/16  4:50 PM  Result Value Ref Range   Glucose-Capillary 150 (H) 65 - 99 mg/dL   Recent Results (from the past 240 hour(s))  Blood culture (routine x 2)     Status: None (Preliminary result)   Collection Time: 11/30/16 12:44 AM  Result Value Ref Range Status   Specimen Description BLOOD LEFT ARM  Final   Special Requests   Final    BOTTLES DRAWN AEROBIC AND ANAEROBIC Blood Culture adequate volume   Culture NO GROWTH < 12 HOURS  Final   Report Status PENDING  Incomplete  Blood culture (routine x 2)     Status: None (Preliminary result)   Collection Time: 11/30/16 12:53 AM  Result Value Ref Range Status   Specimen Description BLOOD LEFT ARM  Final   Special Requests   Final    BOTTLES DRAWN AEROBIC AND ANAEROBIC Blood Culture adequate volume   Culture NO GROWTH < 12 HOURS  Final   Report Status PENDING  Incomplete    Renal Function: Recent Labs    11/29/16 2025 11/30/16 0756  CREATININE 0.74 0.88   Estimated Creatinine Clearance: 109.6 mL/min (by C-G formula based on SCr of 0.88 mg/dL).  Radiologic Imaging: Ct Abdomen Pelvis W Contrast  Result Date: 11/30/2016 CLINICAL DATA:  Left-sided abdominal pain. EXAM: CT ABDOMEN AND PELVIS WITH CONTRAST TECHNIQUE: Multidetector CT imaging of the abdomen and pelvis was performed using the standard protocol following bolus administration of intravenous contrast. CONTRAST:  138mL ISOVUE-300 IOPAMIDOL (ISOVUE-300) INJECTION 61% COMPARISON:  CT 08/15/2012 FINDINGS: Lower chest: Atelectasis and trace pleural effusion noted at the left lung base, likely reactive. Hepatobiliary: The liver is enlarged spanning 23 cm cranial caudal. No focal lesion. Layering sludge or stones in the gallbladder, no pericholecystic inflammation. No biliary dilatation. Pancreas: No ductal dilatation or  inflammation. Spleen: Splenomegaly measuring 15.5 cm craniocaudal. Adrenals/Urinary Tract: 2.1 cm right adrenal nodule is low-density and consistent with adenoma. No left adrenal nodule. Significant left perinephric edema with heterogeneous subcapsular fluid  collection causing mild mass-effect on the renal parenchyma. Fluid collection measures up to 2.2 cm in depth posteriorly. There is are some enhancing septations. No evidence of intrarenal fluid collection. Inflammatory stranding tracks along the course the left ureter into the pelvis. Heterogeneity of the renal parenchyma on delayed phase imaging, was ill-defined low density in the lower pole renal parenchyma. No hydronephrosis. Homogeneous enhancement of the right kidney. Urinary bladder is physiologically distended, small amount of air in the nondependent bladder. No air in the bladder wall. Stomach/Bowel: Stomach is within normal limits. Appendix appears normal. No evidence of bowel wall thickening, distention, or inflammatory changes. No significant diverticular disease. Vascular/Lymphatic: Prominent retroperitoneal nodes at the level of the left kidney are likely reactive. No pelvic adenopathy. Trace aorta bi-iliac atherosclerosis. Reproductive: Bullous appearance of the uterus. Unremarkable adnexa. Other: No free air. No free fluid at this and perinephric edema. 2.5 cm subcutaneous cyst in the right lateral upper abdominal wall, likely a sebaceous cyst, minimal increase from prior exam. Subcutaneous calcification of the right lower wall pannus. Musculoskeletal: There are no acute or suspicious osseous abnormalities. IMPRESSION: 1. Edematous left kidney with circumferential subcapsular heterogeneous collection measuring up to 2.2 cm. Some heterogeneity of left renal parenchyma with ill-defined low density in the lower pole. Findings are suspicious for a subcapsular abscess and pyelonephritis. Subcapsular hematoma is considered but felt less likely. 2.  Hepatosplenomegaly. Sludge or noncalcified stones in the gallbladder without gallbladder inflammation. 3. Mild aorta bi-iliac atherosclerosis, age advanced. Electronically Signed   By: Jeb Levering M.D.   On: 11/30/2016 00:23    I independently reviewed the above imaging studies.  Impression/Assessment:  Probable left pyelonephritis with significant perinephric response i.e. possible bleeding/fluid collection.  More than likely, this situation is exacerbated by her medical condition/poor glucose control.  I cannot say that this is an abscess at this point, however.  She seems to be clinically improving.  Plan:  At this point, I do not think any further imaging is necessary, at least in the near future.  I would continue IV antibiotics, but switch over to oral therapy in the near future.  I would suggest high-dose levofloxacin for at least 14 days.  If she does not have significant improvement over the next day or 2, consider repeat CT scan.  However, if she continues to improve I do not think a repeat scan is necessary for the next couple of weeks or so.  I would recommend tight glucose control, and it sounds as if she will have close diabetic follow-up.  I will make sure she has proper follow-up in our office here within the next 2 or 3 weeks.  We will follow-up while she is an inpatient as well.

## 2016-11-30 NOTE — Progress Notes (Addendum)
ANTIBIOTIC CONSULT NOTE-Preliminary  Pharmacy Consult for Zosyn Indication: Intra-abdominal infection  Allergies  Allergen Reactions  . Latex Itching and Rash    Patient Measurements: Height: 5\' 6"  (167.6 cm) Weight: 215 lb (97.5 kg) IBW/kg (Calculated) : 59.3  Vital Signs: Temp: 98.2 F (36.8 C) (11/26 2330) Temp Source: Oral (11/26 2330) BP: 142/70 (11/26 2330) Pulse Rate: 113 (11/26 2330)  Labs: Recent Labs    11/29/16 2025  WBC 17.9*  HGB 11.9*  PLT 230  CREATININE 0.74    Estimated Creatinine Clearance: 112.3 mL/min (by C-G formula based on SCr of 0.74 mg/dL).  No results for input(s): VANCOTROUGH, VANCOPEAK, VANCORANDOM, GENTTROUGH, GENTPEAK, GENTRANDOM, TOBRATROUGH, TOBRAPEAK, TOBRARND, AMIKACINPEAK, AMIKACINTROU, AMIKACIN in the last 72 hours.   Microbiology: No results found for this or any previous visit (from the past 720 hour(s)).  Medical History: Past Medical History:  Diagnosis Date  . Anxiety   . Arthritis   . Bipolar disorder ([place])   . Diabetes ([place]) 06/05/2015  . Diabetes mellitus   . Fibromyalgia   . History of abnormal cervical Pap smear 05/29/2015  . History of diabetes mellitus, type II 05/29/2015  . Hypertension   . Neuropathy   . Obesity 05/29/2015  . Osteoporosis   . Ovarian cyst 02/20/2016  . Ovarian cyst 02/20/2016   Simple cyst rt ovary  . Panic attacks   . Pneumonia   . Superficial fungus infection of skin 05/29/2015  . Vitamin D deficiency 06/05/2015    Medications:    Assessment: 36 yo diabetic female seen in the ED with complaints of left sided abdominal pain, suprapubic and vaginal pain x several days.  WBCs are elevated. Pharmacy has been consulted for Zosyn dosing for intra abdominal infection.  Goal of Therapy:  Eradicate infection  Plan:  Preliminary review of pertinent patient information completed.  Protocol will be initiated with one dose of Zosyn 3.375 Gm IV.  Forestine Na clinical pharmacist will complete review  during morning rounds to assess patient and finalize treatment regimen if needed.  Norberto Sorenson, RPH 11/30/2016,1:31 AM   Continue zosyn 3.375 gm IV q8 hours F/u cultures and clinical course  Excell Seltzer, PharmD

## 2016-11-30 NOTE — H&P (Signed)
4        History and Physical    Felicia Reynolds ATF:[id number] DOB: [birth year] DOA: 11/29/2016  PCP: Monico Blitz, MD   Patient coming from: Home.  I have personally briefly reviewed patient's old medical records in [place]  Chief Complaint: Left upper quadrant and left flank pain  HPI: Felicia Reynolds is a 36 y.o. female with medical history significant of anxiety, PTSD, panic attacks, arthritis, type 2 diabetes, fibromyalgia, normal Pap smear, type 2 diabetes, hypertension, neuropathy, obesity, osteoporosis, ovarian cyst, history of pneumonia, vitamin D deficiency is coming to the emergency department with complaints of left-sided flank pain that started suddenly on Wednesday while she was walking in the mall with her daughter.  She describes the pain as very intense, to the point that he brought her down to her knees.  On Wednesday evening the patient had a fever and 6 episodes of emesis.  Since then she has had a fever almost every day, had 9 episodes of emesis on Thanksgiving day.  She had a couple of episodes of vomiting over the weekend, but states that she has not vomited any further, since she has pretty much stopped eating, fearful the oral intake will trigger emesis.  She denies chest pain, palpitations, diaphoresis, pitting edema of the lower extremities, diarrhea, constipation, melena or hematochezia. She also complains of dysuria, pelvic pain, inguinal itching and whitish vaginal discharge.  ED Course: Initial vital signs temperature 37.80F, pulse 109, respirations 20, blood pressure 127/77 and O2 sat 95% on room air.  She was given IV fluids and Zosyn in the emergency department.  Blood cultures x2's were taken.  Urinalysis shows glucosuria, mild hemoglobinuria and proteinuria.  She has 0-5 RBCs and 0-5 WBC with rare bacteria on microscopic exam.  However, but budding yeast are present.    Review of Systems: As per HPI otherwise 10 point review of systems negative.     Past Medical History:  Diagnosis Date  . Anxiety   . Arthritis   . Bipolar disorder [facility]) Patient states that this diagnosis entry is not accurate.   . Diabetes ([place]) 06/05/2015  . Diabetes mellitus   . Fibromyalgia   . History of abnormal cervical Pap smear 05/29/2015  . History of diabetes mellitus, type II 05/29/2015  . Hypertension   . Neuropathy   . Obesity 05/29/2015  . Osteoporosis   . Ovarian cyst 02/20/2016  . Ovarian cyst 02/20/2016   Simple cyst rt ovary  . Panic attacks   . Pneumonia   . Superficial fungus infection of skin 05/29/2015  . Vitamin D deficiency 06/05/2015    Past Surgical History:  Procedure Laterality Date  . CESAREAN SECTION    . HERNIA REPAIR    . THROAT SURGERY       reports that she quit smoking about 5 years ago. Her smoking use included cigarettes. She has a 13.00 pack-year smoking history. she has never used smokeless tobacco. She reports that she does not drink alcohol or use drugs.  Allergies  Allergen Reactions  . Latex Itching and Rash    Family History  Problem Relation Age of Onset  . Cancer Mother        breast  . Breast cancer Maternal Aunt   . Breast cancer Maternal Grandmother   . Cancer Maternal Aunt     Prior to Admission medications   Medication Sig Start Date End Date Taking? Authorizing Provider  bismuth subsalicylate (PEPTO BISMOL) 262 MG/15ML suspension Take  30 mLs by mouth daily as needed for indigestion.   Yes [provider]  diclofenac sodium (VOLTAREN) 1 % GEL Apply 2 g topically 4 (four) times daily as needed (pain).    Yes [provider]  DULoxetine (CYMBALTA) 60 MG capsule Take 60 mg by mouth at bedtime.   Yes [provider]  HYDROcodone-acetaminophen (NORCO) 10-325 MG tablet Take 1 tablet by mouth every 8 (eight) hours as needed for moderate pain or severe pain.  11/06/16  Yes [provider]  insulin aspart (NOVOLOG) 100 UNIT/ML injection Inject 10 Units 3 (three) times  daily before meals into the skin. 11/19/16  Yes Florian Buff, MD  LANTUS SOLOSTAR 100 UNIT/ML Solostar Pen Inject 60 Units into the skin at bedtime. 06/02/16  Yes Florian Buff, MD  megestrol (MEGACE) 40 MG tablet Take 40 mg by mouth 3 (three) times daily as needed (regulate period).    Yes [provider]  naproxen sodium (ALEVE) 220 MG tablet Take 220 mg by mouth daily as needed (pain).   Yes [provider]  pregabalin (LYRICA) 200 MG capsule Take 200 mg by mouth 3 (three) times daily.    Yes [provider]  tiZANidine (ZANAFLEX) 4 MG tablet Take 4 mg by mouth 3 (three) times daily.   Yes [provider]  traZODone (DESYREL) 100 MG tablet Take 100 mg by mouth at bedtime.   Yes [provider]  cephALEXin (KEFLEX) 500 MG capsule Take 1 capsule (500 mg total) by mouth 4 (four) times daily. Patient not taking: Reported on 11/19/2016 10/29/16   Estill Dooms, NP  ciprofloxacin-dexamethasone (CIPRODEX) OTIC suspension Place 4 drops into the right ear 2 (two) times daily. Patient not taking: Reported on 11/19/2016 10/29/16   Derrek Monaco A, NP  cyclobenzaprine (FLEXERIL) 10 MG tablet TAKE (1) TABLET BY MOUTH TWICE A DAY AS NEEDED. Patient not taking: Reported on 11/19/2016 11/17/16   Carole Civil, MD  fluconazole (DIFLUCAN) 150 MG tablet Take 1 now and 1 in 3 days Patient not taking: Reported on 11/19/2016 10/29/16   Estill Dooms, NP  Insulin Syringe-Needle U-100 (INSULIN SYRINGE .3CC/29GX1/2") 29G X 1/2" 0.3 ML MISC 1 Syringe 3 (three) times daily by Does not apply route. 11/19/16   Florian Buff, MD  terconazole (TERAZOL 7) 0.4 % vaginal cream Place 1 applicator vaginally at bedtime. Patient not taking: Reported on 11/19/2016 10/29/16   Estill Dooms, NP    Physical Exam: Vitals:   11/30/16 0130 11/30/16 0230 11/30/16 0246 11/30/16 0344  BP: 122/69 126/77  104/62  Pulse: 94 88 94 92  Resp:  (!) 23 18 18   Temp:     98.5 F (36.9 C)  TempSrc:    Oral  SpO2: 95% 97% 99% 100%  Weight:    111.2 kg (245 lb 1.6 oz)  Height:    5\' 6"  (1.676 m)    Constitutional: Mild afebrile.  Looks acutely ill. Eyes: PERRL, lids and conjunctivae normal ENMT: Mucous membranes are mildly dry. Posterior pharynx clear of any exudate or lesions. Neck: normal, supple, no masses, no thyromegaly Respiratory: clear to auscultation bilaterally, no wheezing, no crackles. Normal respiratory effort. No accessory muscle use.  Cardiovascular: Regular rate and rhythm, no murmurs / rubs / gallops. No extremity edema. 2+ pedal pulses. No carotid bruits.  Abdomen: Soft, positive LUQ/suprapubic tenderness on palpation and left CVA tenderness on percussion, no masses palpated. No hepatosplenomegaly. Bowel sounds positive.  Musculoskeletal: no clubbing /  cyanosis. Good ROM, no contractures. Normal muscle tone.  Skin: no rashes, lesions, ulcers on very limited skin exam. Neurologic: CN 2-12 grossly intact. Sensation intact, DTR normal. Strength 5/5 in all 4.  Psychiatric: Normal judgment and insight. Alert and oriented x 3. Normal mood.     Labs on Admission: I have personally reviewed following labs and imaging studies  CBC: Recent Labs  Lab 11/29/16 2025  WBC 17.9*  NEUTROABS 13.4*  HGB 11.9*  HCT 36.9  MCV 84.4  PLT 616   Basic Metabolic Panel: Recent Labs  Lab 11/29/16 2025  NA 124*  K 3.9  CL 87*  CO2 28  GLUCOSE 451*  BUN 7  CREATININE 0.74  CALCIUM 8.6*  MG 1.7  PHOS 2.8   GFR: Estimated Creatinine Clearance: 120.6 mL/min (by C-G formula based on SCr of 0.74 mg/dL). Liver Function Tests: No results for input(s): AST, ALT, ALKPHOS, BILITOT, PROT, ALBUMIN in the last 168 hours. Recent Labs  Lab 11/29/16 2025  LIPASE 19   No results for input(s): AMMONIA in the last 168 hours. Coagulation Profile: No results for input(s): INR, PROTIME in the last 168 hours. Cardiac Enzymes: No results for input(s):  CKTOTAL, CKMB, CKMBINDEX, TROPONINI in the last 168 hours. BNP (last 3 results) No results for input(s): PROBNP in the last 8760 hours. HbA1C: No results for input(s): HGBA1C in the last 72 hours. CBG: Recent Labs  Lab 11/30/16 0120 11/30/16 0349  GLUCAP 317* 277*   Lipid Profile: No results for input(s): CHOL, HDL, LDLCALC, TRIG, CHOLHDL, LDLDIRECT in the last 72 hours. Thyroid Function Tests: No results for input(s): TSH, T4TOTAL, FREET4, T3FREE, THYROIDAB in the last 72 hours. Anemia Panel: No results for input(s): VITAMINB12, FOLATE, FERRITIN, TIBC, IRON, RETICCTPCT in the last 72 hours. Urine analysis:    Component Value Date/Time   COLORURINE YELLOW 11/29/2016 2055   APPEARANCEUR CLEAR 11/29/2016 2055   APPEARANCEUR Cloudy (A) 10/29/2016 1200   LABSPEC 1.026 11/29/2016 2055   PHURINE 5.0 11/29/2016 2055   GLUCOSEU >=500 (A) 11/29/2016 2055   HGBUR SMALL (A) 11/29/2016 2055   BILIRUBINUR NEGATIVE 11/29/2016 2055   BILIRUBINUR Negative 10/29/2016 1200   KETONESUR NEGATIVE 11/29/2016 2055   PROTEINUR 30 (A) 11/29/2016 2055   UROBILINOGEN 0.2 11/28/2011 0642   NITRITE NEGATIVE 11/29/2016 2055   LEUKOCYTESUR NEGATIVE 11/29/2016 2055   LEUKOCYTESUR 1+ (A) 10/29/2016 1200    Radiological Exams on Admission: Ct Abdomen Pelvis W Contrast  Result Date: 11/30/2016 CLINICAL DATA:  Left-sided abdominal pain. EXAM: CT ABDOMEN AND PELVIS WITH CONTRAST TECHNIQUE: Multidetector CT imaging of the abdomen and pelvis was performed using the standard protocol following bolus administration of intravenous contrast. CONTRAST:  168mL ISOVUE-300 IOPAMIDOL (ISOVUE-300) INJECTION 61% COMPARISON:  CT 08/15/2012 FINDINGS: Lower chest: Atelectasis and trace pleural effusion noted at the left lung base, likely reactive. Hepatobiliary: The liver is enlarged spanning 23 cm cranial caudal. No focal lesion. Layering sludge or stones in the gallbladder, no pericholecystic inflammation. No biliary  dilatation. Pancreas: No ductal dilatation or inflammation. Spleen: Splenomegaly measuring 15.5 cm craniocaudal. Adrenals/Urinary Tract: 2.1 cm right adrenal nodule is low-density and consistent with adenoma. No left adrenal nodule. Significant left perinephric edema with heterogeneous subcapsular fluid collection causing mild mass-effect on the renal parenchyma. Fluid collection measures up to 2.2 cm in depth posteriorly. There is are some enhancing septations. No evidence of intrarenal fluid collection. Inflammatory stranding tracks along the course the left ureter into the pelvis. Heterogeneity of the renal parenchyma on delayed phase  imaging, was ill-defined low density in the lower pole renal parenchyma. No hydronephrosis. Homogeneous enhancement of the right kidney. Urinary bladder is physiologically distended, small amount of air in the nondependent bladder. No air in the bladder wall. Stomach/Bowel: Stomach is within normal limits. Appendix appears normal. No evidence of bowel wall thickening, distention, or inflammatory changes. No significant diverticular disease. Vascular/Lymphatic: Prominent retroperitoneal nodes at the level of the left kidney are likely reactive. No pelvic adenopathy. Trace aorta bi-iliac atherosclerosis. Reproductive: Bullous appearance of the uterus. Unremarkable adnexa. Other: No free air. No free fluid at this and perinephric edema. 2.5 cm subcutaneous cyst in the right lateral upper abdominal wall, likely a sebaceous cyst, minimal increase from prior exam. Subcutaneous calcification of the right lower wall pannus. Musculoskeletal: There are no acute or suspicious osseous abnormalities. IMPRESSION: 1. Edematous left kidney with circumferential subcapsular heterogeneous collection measuring up to 2.2 cm. Some heterogeneity of left renal parenchyma with ill-defined low density in the lower pole. Findings are suspicious for a subcapsular abscess and pyelonephritis. Subcapsular  hematoma is considered but felt less likely. 2. Hepatosplenomegaly. Sludge or noncalcified stones in the gallbladder without gallbladder inflammation. 3. Mild aorta bi-iliac atherosclerosis, age advanced. Electronically Signed   By: Jeb Levering M.D.   On: 11/30/2016 00:23    EKG: Independently reviewed.   Assessment/Plan Principal Problem:   Acute pyelonephritis Suspicion for subcapsular abscess on CT. History of ESBL UTI. Admit to telemetry/inpatient. Continue IV fluids. Continue Zosyn 3.375 g every 8 hours.. Follow up blood culture and sensitivity. Follow-up urine culture and sensitivity. Consult urology.  Active Problems:   Uncontrolled type 2 diabetes mellitus with complication ([place]) Per patient, her Lantus was increased to 60 twice daily  after A1c was more than 13 in August. Carbohydrate modified diet. Continue IV hydration. Continue Lantus 60 units SQ twice daily. CBG monitoring with regular insulin sliding scale.    Vaginal candidiasis Per patient, she is supposed to be taking daily oral Diflucan for a few days. Diflucan 150 mg p.o. daily has been started.    Essential hypertension, benign Not on antihypertensive. Monitor blood pressure. Consider starting therapy if blood pressure trends upwards.    Menorrhagia with irregular cycle On pulse Megace. However, currently not having dysfunctional vaginal bleeding.    Hyponatremia Corrected sodium to hyperglycemia was 132 mmol/L by Hillier formula. Continue normal saline infusion. Follow-up sodium level.   DVT prophylaxis: Heparin SQ. Code Status: Full code Family Communication:  Disposition Plan: Admit for IV antibiotics, IV hydration, blood glucose control and urology evaluation. Consults called: Routine urology consult. Admission status: Inpatient/telemetry.   Reubin Milan MD [facility] Pager [phone].  If 7PM-7AM, please contact night-coverage [email or web address] Password  [facility]  11/30/2016, 4:59 AM   This document was prepared using Dragon voice recognition software may contain some errors.

## 2016-11-30 NOTE — ED Provider Notes (Signed)
Patient seen/examined in the Emergency Department in conjunction with Midlevel Provider McDonald Patient reports abdominal pain and back pain Exam : Awake alert appropriate, no focal abdominal tenderness, she is not septic appearing Plan: Admit to hospital for IV antibiotics and possibly antifungal medications.     Ripley Fraise, MD 11/30/16 [phone]

## 2016-11-30 NOTE — Progress Notes (Signed)
11/29/2016  9:20 PM  11/30/2016 3:17 PM  Felicia Reynolds was seen and examined.  The H&P by the admitting provider, orders, imaging was reviewed.  Please see new orders.  Will continue to follow.   Murvin Natal, MD [facility]

## 2016-11-30 NOTE — Progress Notes (Addendum)
Inpatient Diabetes Program Recommendations  AACE/ADA: New Consensus Statement on Inpatient Glycemic Control (2015)  Target Ranges:  Prepandial:   less than 140 mg/dL      Peak postprandial:   less than 180 mg/dL (1-2 hours)      Critically ill patients:  140 - 180 mg/dL  Results for Felicia Reynolds, Felicia Reynolds (MRN [id number]) as of 11/30/2016 09:57  Ref. Range 11/30/2016 01:20 11/30/2016 03:49 11/30/2016 07:37  Glucose-Capillary Latest Ref Range: 65 - 99 mg/dL 317 (H) 277 (H) 327 (H)   Results for [name], [name] (MRN [id number]) as of 11/30/2016 09:57  Ref. Range 08/20/2016 09:03  Hemoglobin A1C Latest Ref Range: 4.8 - 5.6 % 13.6 (H)   Review of Glycemic Control  Diabetes history: DM2 Outpatient Diabetes medications: Lantus 60 units QHS, Novolog 10 units TID with meals Current orders for Inpatient glycemic control: Lantus 60 units QHS, Novolog 0-20 units TID with meals  Inpatient Diabetes Program Recommendations: Basal: Noted no Lantus was given last night as order for Lantus 60 units QHS to start today at bedtime. Correction (SSI): Please consider ordering Novolog 0-5 units QHS for bedtime correction. HgbA1C: Last A1C in the chart was 13.6% on 08/20/16. Current A1C in process.   NOTE: In reviewing chart, noted patient last seen Dr. Dorris Fetch on 05/05/16. Recommend patient follow up with Dr. Dorris Fetch to improve DM control. Noted in H&P on 11/30/16 "Per patient, her Lantus was increased to 60 twice daily after A1c was more than 13 in August."    Thanks, Barnie Alderman, RN, MSN, CDE Diabetes Coordinator Inpatient Diabetes Program [phone] (Team Pager from 8am to 5pm)

## 2016-12-01 DIAGNOSIS — N12 Tubulo-interstitial nephritis, not specified as acute or chronic: Secondary | ICD-10-CM

## 2016-12-01 DIAGNOSIS — E871 Hypo-osmolality and hyponatremia: Secondary | ICD-10-CM

## 2016-12-01 DIAGNOSIS — I10 Essential (primary) hypertension: Secondary | ICD-10-CM

## 2016-12-01 DIAGNOSIS — N151 Renal and perinephric abscess: Secondary | ICD-10-CM

## 2016-12-01 DIAGNOSIS — B373 Candidiasis of vulva and vagina: Secondary | ICD-10-CM

## 2016-12-01 DIAGNOSIS — E1165 Type 2 diabetes mellitus with hyperglycemia: Secondary | ICD-10-CM

## 2016-12-01 DIAGNOSIS — E118 Type 2 diabetes mellitus with unspecified complications: Secondary | ICD-10-CM

## 2016-12-01 LAB — CBC WITH DIFFERENTIAL/PLATELET
Basophils Absolute: 0 10*3/uL (ref 0.0–0.1)
Basophils Relative: 0 %
EOS PCT: 0 %
Eosinophils Absolute: 0.1 10*3/uL (ref 0.0–0.7)
HCT: 33.8 % — ABNORMAL LOW (ref 36.0–46.0)
Hemoglobin: 10.8 g/dL — ABNORMAL LOW (ref 12.0–15.0)
LYMPHS ABS: 2 10*3/uL (ref 0.7–4.0)
LYMPHS PCT: 12 %
MCH: 27.3 pg (ref 26.0–34.0)
MCHC: 32 g/dL (ref 30.0–36.0)
MCV: 85.4 fL (ref 78.0–100.0)
MONO ABS: 1.6 10*3/uL — AB (ref 0.1–1.0)
MONOS PCT: 9 %
Neutro Abs: 13.2 10*3/uL — ABNORMAL HIGH (ref 1.7–7.7)
Neutrophils Relative %: 79 %
PLATELETS: 264 10*3/uL (ref 150–400)
RBC: 3.96 MIL/uL (ref 3.87–5.11)
RDW: 14.7 % (ref 11.5–15.5)
WBC: 16.8 10*3/uL — ABNORMAL HIGH (ref 4.0–10.5)

## 2016-12-01 LAB — COMPREHENSIVE METABOLIC PANEL
ALK PHOS: 305 U/L — AB (ref 38–126)
ALT: 18 U/L (ref 14–54)
ANION GAP: 7 (ref 5–15)
AST: 29 U/L (ref 15–41)
Albumin: 2.2 g/dL — ABNORMAL LOW (ref 3.5–5.0)
BUN: 13 mg/dL (ref 6–20)
CALCIUM: 8.1 mg/dL — AB (ref 8.9–10.3)
CHLORIDE: 98 mmol/L — AB (ref 101–111)
CO2: 26 mmol/L (ref 22–32)
CREATININE: 0.83 mg/dL (ref 0.44–1.00)
Glucose, Bld: 172 mg/dL — ABNORMAL HIGH (ref 65–99)
Potassium: 4.1 mmol/L (ref 3.5–5.1)
Sodium: 131 mmol/L — ABNORMAL LOW (ref 135–145)
Total Bilirubin: 0.7 mg/dL (ref 0.3–1.2)
Total Protein: 6.2 g/dL — ABNORMAL LOW (ref 6.5–8.1)

## 2016-12-01 LAB — GLUCOSE, CAPILLARY
GLUCOSE-CAPILLARY: 191 mg/dL — AB (ref 65–99)
GLUCOSE-CAPILLARY: 108 mg/dL — AB (ref 65–99)
Glucose-Capillary: 193 mg/dL — ABNORMAL HIGH (ref 65–99)
Glucose-Capillary: 82 mg/dL (ref 65–99)

## 2016-12-01 LAB — URINE CULTURE: CULTURE: NO GROWTH

## 2016-12-01 MED ORDER — LEVOFLOXACIN IN D5W 750 MG/150ML IV SOLN
750.0000 mg | INTRAVENOUS | Status: DC
  Administered 2016-12-01 – 2016-12-02 (×2): 750 mg via INTRAVENOUS
  Filled 2016-12-01 (×2): qty 150

## 2016-12-01 MED ORDER — HYDROCODONE-ACETAMINOPHEN 10-325 MG PO TABS
1.0000 | ORAL_TABLET | Freq: Four times a day (QID) | ORAL | Status: DC | PRN
  Administered 2016-12-01 – 2016-12-03 (×6): 1 via ORAL
  Filled 2016-12-01 (×6): qty 1

## 2016-12-01 MED ORDER — HYDROCODONE-ACETAMINOPHEN 10-325 MG PO TABS: 1.0000 | ORAL_TABLET | Freq: Four times a day (QID) | ORAL | Status: DC | PRN

## 2016-12-01 MED ORDER — POLYETHYLENE GLYCOL 3350 17 G PO PACK
17.0000 g | PACK | Freq: Two times a day (BID) | ORAL | Status: DC
  Administered 2016-12-01 (×2): 17 g via ORAL
  Filled 2016-12-01 (×5): qty 1

## 2016-12-01 MED ORDER — MORPHINE SULFATE (PF) 2 MG/ML IV SOLN
2.0000 mg | INTRAVENOUS | Status: DC | PRN
  Administered 2016-12-01 – 2016-12-02 (×3): 2 mg via INTRAVENOUS
  Filled 2016-12-01 (×3): qty 1

## 2016-12-01 NOTE — Progress Notes (Signed)
Inpatient Diabetes Program Recommendations  AACE/ADA: New Consensus Statement on Inpatient Glycemic Control (2015)  Target Ranges:  Prepandial:   less than 140 mg/dL      Peak postprandial:   less than 180 mg/dL (1-2 hours)      Critically ill patients:  140 - 180 mg/dL   Results for COLINE, CALKIN (MRN [id number]) as of 12/01/2016 11:58  Ref. Range 11/30/2016 03:49 11/30/2016 07:37 11/30/2016 11:50 11/30/2016 16:50 11/30/2016 20:45 12/01/2016 07:57 12/01/2016 11:20  Glucose-Capillary Latest Ref Range: 65 - 99 mg/dL 277 (H) 327 (H) 265 (H) 150 (H) 141 (H) 191 (H) 193 (H)  Results for DARLEEN, MOFFITT (MRN [id number]) as of 12/01/2016 11:58  Ref. Range 11/30/2016 07:56  Hemoglobin A1C Latest Ref Range: 4.8 - 5.6 % 13.4 (H)   Review of Glycemic Control  Diabetes history: DM2 Outpatient Diabetes medications: Lantus 60 units BID, Novolog 10 units TID with meals Current orders for Inpatient glycemic control: Lantus 66 units daily, Novolog 0-20 units TID with meals, Novolog 20 units TID with meals for meal coverage  Inpatient Diabetes Program Recommendations: Correction (SSI): Please consider ordering Novolog 0-5 units QHS for bedtime correction. HgbA1C: A1C 13.4% on 11/30/16 indicating an average glucose of 338 mg/dl over the past 2-3 months. Recommend patient follow up with Dr. Dorris Fetch for assistance with improve DM control.  NOTE: Spoke with patient about diabetes and home regimen for diabetes control. Patient reports that she seen Dr. Dorris Fetch in May but due to transportation issues she has not been able to follow back up with Dr. Dorris Fetch since initial visit. Patient states that she now has another vehicle and she should be able to get to the follow up visits with doctors. Patient reports that she recently seen Dr. Elonda Husky on 11/19/16 and he prescribed Novolog 10 units TID with meals. Patient also confirms that she is taking Lantus 60 units BID. Patient reports that she needs an ablation and Dr. Elonda Husky  wants her to get her DM better controlled before surgery. Noted in Dr. Brynda Greathouse office note on 11/19/16, plan for hysteroscopy D&C endometrial ablation on 12/08/16. Patient states that she is using her sister's glucometer to check her glucose 4 times per day as Dr. Elonda Husky has asked her to do. Patient states that her personal glucometer does not work. Informed patient, it would be requested that MD provide her with prescription for a new glucometer and testing supplies. Explained to the patient that it is very important for her to check glucose as requested and to keep a record of the readings so Dr. Elonda Husky can see how her glucose is trending. Patient states that over the past week her glucose has been much better (less than 200 mg/dl for the most part). Discussed A1C results (13.4% on 11/30/16) and explained that her current A1C indicates an average glucose of 338 mg/dl over the past 2-3 months. Explained that in order to determine overall impact of adding Novolog with meals, she would need to be on the regimen for about 3 months in order to see improvement in A1C.  Discussed glucose and A1C goals. Discussed importance of checking CBGs and maintaining good CBG control to prevent long-term and short-term complications. Stressed to the patient the importance of improving glycemic control to prevent further complications from uncontrolled diabetes especially with needed surgery to decrease risk of infection and promote wound healing. Discussed impact of nutrition, exercise, stress, sickness, and medications on diabetes control. Patient reprots that she has been in significant pain recently and  she is not eating anything white (no white sugar, no pasta, no bread) and she is not drinking any sugary drinks. Patient reports that she did weigh over 700 lbs and she has lost a significant amount of weight. Patient expresses frustration that it seems her DM has gotten worse since she lost weight. Inquired about insulin injection  sites and patient states that she is using an insulin pen and injects into her right lower abdomen (in large sink fold). Visualized area which is apparent that patient is using that particular site for all insulin injections. Discussed rotation sites and explained that if insulin is injected into scar tissue it will not be absorbed adequately.  Encouraged patient to check her glucose 4 times per day (before meals and at bedtime) and to keep a log book of glucose readings and insulin taken which she will need to take to doctor appointments.  Encouraged patient to also get her own glucometer and testing supplies (will need MD to provide Rx for both at discharge) and to be sure to follow up with Dr. Dorris Fetch.  Patient verbalized understanding of information discussed and she states that she has no further questions at this time related to diabetes.  Thanks, Barnie Alderman, RN, MSN, CDE Diabetes Coordinator Inpatient Diabetes Program [phone] (Team Pager)

## 2016-12-01 NOTE — Progress Notes (Signed)
Spoke with Tammy from infection prevention. Able to take the patient off of contact precautions, urine cultures also came back negative.

## 2016-12-01 NOTE — Progress Notes (Signed)
PROGRESS NOTE    Felicia Reynolds  LFY:[id number]  DOB: 12-18-1980  DOA: 11/29/2016 PCP: Monico Blitz, MD   Brief Admission Hx: Felicia Reynolds is a 36 y.o. female with multiple medical issues including diabetes mellitus poorly controlled with A1c>13, bipolar disorder, fibromyalgia, hypertension, osteoporosis, panic attacks, admitted yesterday for management of a probable UTI as well as significant perinephric fluid collection.    MDM/Assessment & Plan:   1. Left Pyelonephritis - with possible subcapsular abscess - continue IV levaquin with possible transition to oral levofloxacin tomorrow to complete a full 14 day course.  Follow up with urology. She is clinically improving and therefore did not order re-imaging.  Appreciate urology consult.   2. 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  Pt is glucose toxic and will need to discharge on basal bolus insulin to have any chance of keeping her diabetes under control.  She has a [employer]icult[employer], [employer] pen.  Would prescribe a novolog flexpen or humalog kwikpen to use for prandial insulin dosing at discharge and have her follow up with endocrinologist Dr. Dorris Fetch as soon as possible.  Also could prescribe a glucagon emergency kit at discharge to have at home for hypoglycemic emergencies.  3. Vaginal candidiasis - Pt says that symptoms much improved---continue fluconazole.   DVT prophylaxis: heparin Code Status: full  Family Communication: none present at bedside  Disposition Plan: home tomorrow if stable.    Consultants:  urology  Subjective: Pt says that she is feeling much better.    Objective: Vitals:   11/30/16 1610 11/30/16 2036 11/30/16 2231 12/01/16 0530  BP: (!) 114/59 (!) 148/74  130/74  Pulse: (!) 104 (!) 113  (!) 106  Resp: 18 18  18   Temp: 99.8 F (37.7 C) (!) 102.5 F (39.2 C) 100.1 F (37.8 C) 99.3 F (37.4 C)  TempSrc:  Oral   Oral  SpO2: 96% 95%  95%  Weight:      Height:        Intake/Output Summary (Last 24 hours) at 12/01/2016 1308 Last data filed at 12/01/2016 0500 Gross per 24 hour  Intake 1175 ml  Output 400 ml  Net 775 ml   Filed Weights   11/29/16 1937 11/30/16 0344  Weight: 97.5 kg (215 lb) 111.2 kg (245 lb 1.6 oz)     REVIEW OF SYSTEMS  As per history otherwise all reviewed and reported negative  Exam:  General exam: awake, alert, NAD.  Respiratory system: Clear. No increased work of breathing. Cardiovascular system: S1 & S2 heard, RRR. No JVD, murmurs, gallops, clicks or pedal edema. Gastrointestinal system: persistent left flank pain.  Abdomen is nondistended, soft and nontender. Normal bowel sounds heard. Central nervous system: Alert and oriented. No focal neurological deficits. Extremities: no CCE.  Data Reviewed: Basic Metabolic Panel: Recent Labs  Lab 11/29/16 2025 11/30/16 0756 12/01/16 0625  NA 124* 128* 131*  K 3.9 3.8 4.1  CL 87* 93* 98*  CO2 28 27 26   GLUCOSE 451* 346* 172*  BUN 7 11 13   CREATININE 0.74 0.88 0.83  CALCIUM 8.6* 8.4* 8.1*  MG 1.7  --   --   PHOS 2.8  --   --    Liver Function Tests: Recent Labs  Lab 11/30/16 0756 12/01/16 0625  AST 16 29  ALT 12* 18  ALKPHOS 147* 305*  BILITOT 0.7 0.7  PROT 6.3* 6.2*  ALBUMIN 2.2* 2.2*   Recent Labs  Lab 11/29/16 2025  LIPASE 19   No results for input(s): AMMONIA in the last 168 hours. CBC: Recent Labs  Lab 11/29/16 2025 11/30/16 0756 12/01/16 0625  WBC 17.9* 15.4* 16.8*  NEUTROABS 13.4* 11.6* 13.2*  HGB 11.9* 10.9* 10.8*  HCT 36.9 33.9* 33.8*  MCV 84.4 84.3 85.4  PLT 230 221 264   Cardiac Enzymes: No results for input(s): CKTOTAL, CKMB, CKMBINDEX, TROPONINI in the last 168 hours. CBG (last 3)  Recent Labs    11/30/16 2045 12/01/16 0757 12/01/16 1120  GLUCAP 141* 191* 193*   Recent Results (from the past 240 hour(s))  Urine culture     Status: None   Collection Time: 11/29/16   8:55 PM  Result Value Ref Range Status   Specimen Description URINE, RANDOM  Final   Special Requests NONE  Final   Culture   Final    NO GROWTH Performed at [facility], [place] [address] [place],  [address]    Report Status 12/01/2016 FINAL  Final  Blood culture (routine x 2)     Status: None (Preliminary result)   Collection Time: 11/30/16 12:44 AM  Result Value Ref Range Status   Specimen Description BLOOD LEFT ARM  Final   Special Requests   Final    BOTTLES DRAWN AEROBIC AND ANAEROBIC Blood Culture adequate volume   Culture NO GROWTH 1 DAY  Final   Report Status PENDING  Incomplete  Blood culture (routine x 2)     Status: None (Preliminary result)   Collection Time: 11/30/16 12:53 AM  Result Value Ref Range Status   Specimen Description BLOOD LEFT ARM  Final   Special Requests   Final    BOTTLES DRAWN AEROBIC AND ANAEROBIC Blood Culture adequate volume   Culture NO GROWTH 1 DAY  Final   Report Status PENDING  Incomplete     Studies: Ct Abdomen Pelvis W Contrast  Result Date: 11/30/2016 CLINICAL DATA:  Left-sided abdominal pain. EXAM: CT ABDOMEN AND PELVIS WITH CONTRAST TECHNIQUE: Multidetector CT imaging of the abdomen and pelvis was performed using the standard protocol following bolus administration of intravenous contrast. CONTRAST:  1106m ISOVUE-300 IOPAMIDOL (ISOVUE-300) INJECTION 61% COMPARISON:  CT 08/15/2012 FINDINGS: Lower chest: Atelectasis and trace pleural effusion noted at the left lung base, likely reactive. Hepatobiliary: The liver is enlarged spanning 23 cm cranial caudal. No focal lesion. Layering sludge or stones in the gallbladder, no pericholecystic inflammation. No biliary dilatation. Pancreas: No ductal dilatation or inflammation. Spleen: Splenomegaly measuring 15.5 cm craniocaudal. Adrenals/Urinary Tract: 2.1 cm right adrenal nodule is low-density and consistent with adenoma. No left adrenal nodule. Significant left perinephric edema with  heterogeneous subcapsular fluid collection causing mild mass-effect on the renal parenchyma. Fluid collection measures up to 2.2 cm in depth posteriorly. There is are some enhancing septations. No evidence of intrarenal fluid collection. Inflammatory stranding tracks along the course the left ureter into the pelvis. Heterogeneity of the renal parenchyma on delayed phase imaging, was ill-defined low density in the lower pole renal parenchyma. No hydronephrosis. Homogeneous enhancement of the right kidney. Urinary bladder is physiologically distended, small amount of air in the nondependent bladder. No air in the bladder wall. Stomach/Bowel: Stomach is within normal limits. Appendix appears normal. No evidence of bowel wall thickening, distention, or inflammatory changes. No significant diverticular disease. Vascular/Lymphatic: Prominent retroperitoneal nodes at the level of the left kidney are likely reactive. No pelvic adenopathy. Trace aorta bi-iliac atherosclerosis. Reproductive: Bullous appearance of the uterus. Unremarkable adnexa. Other:  No free air. No free fluid at this and perinephric edema. 2.5 cm subcutaneous cyst in the right lateral upper abdominal wall, likely a sebaceous cyst, minimal increase from prior exam. Subcutaneous calcification of the right lower wall pannus. Musculoskeletal: There are no acute or suspicious osseous abnormalities. IMPRESSION: 1. Edematous left kidney with circumferential subcapsular heterogeneous collection measuring up to 2.2 cm. Some heterogeneity of left renal parenchyma with ill-defined low density in the lower pole. Findings are suspicious for a subcapsular abscess and pyelonephritis. Subcapsular hematoma is considered but felt less likely. 2. Hepatosplenomegaly. Sludge or noncalcified stones in the gallbladder without gallbladder inflammation. 3. Mild aorta bi-iliac atherosclerosis, age advanced. Electronically Signed   By: Jeb Levering M.D.   On: 11/30/2016 00:23      Scheduled Meds: . DULoxetine  60 mg Oral QHS  . fluconazole  150 mg Oral Daily  . heparin  5,000 Units Subcutaneous Q8H  .  HYDROmorphone (DILAUDID) injection  1 mg Intravenous Once  . insulin aspart  0-20 Units Subcutaneous TID WC  . insulin aspart  20 Units Subcutaneous TID WC  . insulin glargine  66 Units Subcutaneous Daily  . polyethylene glycol  17 g Oral BID  . pregabalin  200 mg Oral TID   Continuous Infusions: . sodium chloride 125 mL/hr at 12/01/16 0326  . levofloxacin (LEVAQUIN) IV Stopped (12/01/16 1022)    Principal Problem:   Acute pyelonephritis Active Problems:   Uncontrolled type 2 diabetes mellitus with complication (HCC)   Essential hypertension, benign   Menorrhagia with irregular cycle   Hyponatremia   Vaginal candidiasis   Time spent:   Irwin Brakeman, MD, FAAFP [facility] Pager [phone]  If 7PM-7AM, please contact night-coverage [email or web address] Password [email or web address] 12/01/2016, 1:08 PM    LOS: 1 day

## 2016-12-01 NOTE — Progress Notes (Signed)
Subjective: Patient notes significant improvement in her left flank pain. She had a fever of 102.7 yesterday but no fevers today. No hematuria or worsening LUTS..  Objective: Vital signs in last 24 hours: Temp:  [99.3 F (37.4 C)-102.5 F (39.2 C)] 99.3 F (37.4 C) (11/28 0530) Pulse Rate:  [104-113] 106 (11/28 0530) Resp:  [18] 18 (11/28 0530) BP: (114-148)/(59-74) 130/74 (11/28 0530) SpO2:  [95 %-96 %] 95 % (11/28 0530)  Intake/Output from previous day: 11/27 0701 - 11/28 0700 In: 2202 [P.O.:480; I.V.:1125; IV Piggyback:50] Out: 400 [Urine:400] Intake/Output this shift: No intake/output data recorded.  Physical Exam:  General:alert, cooperative and appears stated age GI: soft, non tender, normal bowel sounds, no palpable masses, no organomegaly, no inguinal hernia Female genitalia: not done Extremities: extremities normal, atraumatic, no cyanosis or edema  Lab Results: Recent Labs    11/29/16 2025 11/30/16 0756 12/01/16 0625  HGB 11.9* 10.9* 10.8*  HCT 36.9 33.9* 33.8*   BMET Recent Labs    11/30/16 0756 12/01/16 0625  NA 128* 131*  K 3.8 4.1  CL 93* 98*  CO2 27 26  GLUCOSE 346* 172*  BUN 11 13  CREATININE 0.88 0.83  CALCIUM 8.4* 8.1*   No results for input(s): LABPT, INR in the last 72 hours. No results for input(s): LABURIN in the last 72 hours. Results for orders placed or performed during the hospital encounter of 11/29/16  Urine culture     Status: None   Collection Time: 11/29/16  8:55 PM  Result Value Ref Range Status   Specimen Description URINE, RANDOM  Final   Special Requests NONE  Final   Culture   Final    NO GROWTH Performed at [facility], [address], [place], [place] [address]    Report Status 12/01/2016 FINAL  Final  Blood culture (routine x 2)     Status: None (Preliminary result)   Collection Time: 11/30/16 12:44 AM  Result Value Ref Range Status   Specimen Description BLOOD LEFT ARM  Final   Special Requests    Final    BOTTLES DRAWN AEROBIC AND ANAEROBIC Blood Culture adequate volume   Culture NO GROWTH 1 DAY  Final   Report Status PENDING  Incomplete  Blood culture (routine x 2)     Status: None (Preliminary result)   Collection Time: 11/30/16 12:53 AM  Result Value Ref Range Status   Specimen Description BLOOD LEFT ARM  Final   Special Requests   Final    BOTTLES DRAWN AEROBIC AND ANAEROBIC Blood Culture adequate volume   Culture NO GROWTH 1 DAY  Final   Report Status PENDING  Incomplete    Studies/Results: Ct Abdomen Pelvis W Contrast  Result Date: 11/30/2016 CLINICAL DATA:  Left-sided abdominal pain. EXAM: CT ABDOMEN AND PELVIS WITH CONTRAST TECHNIQUE: Multidetector CT imaging of the abdomen and pelvis was performed using the standard protocol following bolus administration of intravenous contrast. CONTRAST:  118mL ISOVUE-300 IOPAMIDOL (ISOVUE-300) INJECTION 61% COMPARISON:  CT 08/15/2012 FINDINGS: Lower chest: Atelectasis and trace pleural effusion noted at the left lung base, likely reactive. Hepatobiliary: The liver is enlarged spanning 23 cm cranial caudal. No focal lesion. Layering sludge or stones in the gallbladder, no pericholecystic inflammation. No biliary dilatation. Pancreas: No ductal dilatation or inflammation. Spleen: Splenomegaly measuring 15.5 cm craniocaudal. Adrenals/Urinary Tract: 2.1 cm right adrenal nodule is low-density and consistent with adenoma. No left adrenal nodule. Significant left perinephric edema with heterogeneous subcapsular fluid collection causing mild mass-effect on the  renal parenchyma. Fluid collection measures up to 2.2 cm in depth posteriorly. There is are some enhancing septations. No evidence of intrarenal fluid collection. Inflammatory stranding tracks along the course the left ureter into the pelvis. Heterogeneity of the renal parenchyma on delayed phase imaging, was ill-defined low density in the lower pole renal parenchyma. No hydronephrosis.  Homogeneous enhancement of the right kidney. Urinary bladder is physiologically distended, small amount of air in the nondependent bladder. No air in the bladder wall. Stomach/Bowel: Stomach is within normal limits. Appendix appears normal. No evidence of bowel wall thickening, distention, or inflammatory changes. No significant diverticular disease. Vascular/Lymphatic: Prominent retroperitoneal nodes at the level of the left kidney are likely reactive. No pelvic adenopathy. Trace aorta bi-iliac atherosclerosis. Reproductive: Bullous appearance of the uterus. Unremarkable adnexa. Other: No free air. No free fluid at this and perinephric edema. 2.5 cm subcutaneous cyst in the right lateral upper abdominal wall, likely a sebaceous cyst, minimal increase from prior exam. Subcutaneous calcification of the right lower wall pannus. Musculoskeletal: There are no acute or suspicious osseous abnormalities. IMPRESSION: 1. Edematous left kidney with circumferential subcapsular heterogeneous collection measuring up to 2.2 cm. Some heterogeneity of left renal parenchyma with ill-defined low density in the lower pole. Findings are suspicious for a subcapsular abscess and pyelonephritis. Subcapsular hematoma is considered but felt less likely. 2. Hepatosplenomegaly. Sludge or noncalcified stones in the gallbladder without gallbladder inflammation. 3. Mild aorta bi-iliac atherosclerosis, age advanced. Electronically Signed   By: Jeb Levering M.D.   On: 11/30/2016 00:23    Assessment/Plan: 36yo with left pyelonephritis with subcapsular edema/possible abscess  The patient is clinically better on IV antibiotics and she has almost complete resolution of her pain. Please continue IV antibiotics pending urine culture. If the patient continues to have high fevers (over 102) please repeat CT scan. If the supcapsular fluid collection increases in size the patient will need drain placement by interventional radiology.    LOS: 1  day   Nicolette Bang 12/01/2016, 3:19 PM

## 2016-12-02 ENCOUNTER — Encounter (HOSPITAL_COMMUNITY): Admission: RE | Admit: 2016-12-02 | Payer: Medicaid Other | Source: Ambulatory Visit

## 2016-12-02 LAB — COMPREHENSIVE METABOLIC PANEL
ALT: 17 U/L (ref 14–54)
ANION GAP: 7 (ref 5–15)
AST: 25 U/L (ref 15–41)
Albumin: 2 g/dL — ABNORMAL LOW (ref 3.5–5.0)
Alkaline Phosphatase: 412 U/L — ABNORMAL HIGH (ref 38–126)
BILIRUBIN TOTAL: 0.5 mg/dL (ref 0.3–1.2)
BUN: 12 mg/dL (ref 6–20)
CHLORIDE: 100 mmol/L — AB (ref 101–111)
CO2: 26 mmol/L (ref 22–32)
Calcium: 8 mg/dL — ABNORMAL LOW (ref 8.9–10.3)
Creatinine, Ser: 0.69 mg/dL (ref 0.44–1.00)
GFR calc Af Amer: >60 mL/min (ref >60)
Glucose, Bld: 88 mg/dL (ref 65–99)
POTASSIUM: 3.9 mmol/L (ref 3.5–5.1)
Sodium: 133 mmol/L — ABNORMAL LOW (ref 135–145)
TOTAL PROTEIN: 6.1 g/dL — AB (ref 6.5–8.1)

## 2016-12-02 LAB — GLUCOSE, CAPILLARY
GLUCOSE-CAPILLARY: 79 mg/dL (ref 65–99)
GLUCOSE-CAPILLARY: 104 mg/dL — AB (ref 65–99)
GLUCOSE-CAPILLARY: 303 mg/dL — AB (ref 65–99)
Glucose-Capillary: 159 mg/dL — ABNORMAL HIGH (ref 65–99)
Glucose-Capillary: 80 mg/dL (ref 65–99)

## 2016-12-02 LAB — CBC WITH DIFFERENTIAL/PLATELET
BASOS ABS: 0 10*3/uL (ref 0.0–0.1)
BASOS PCT: 0 %
EOS ABS: 0 10*3/uL (ref 0.0–0.7)
EOS PCT: 0 %
HCT: 30.9 % — ABNORMAL LOW (ref 36.0–46.0)
Hemoglobin: 9.8 g/dL — ABNORMAL LOW (ref 12.0–15.0)
Lymphocytes Relative: 13 %
Lymphs Abs: 2 10*3/uL (ref 0.7–4.0)
MCH: 27.1 pg (ref 26.0–34.0)
MCHC: 31.7 g/dL (ref 30.0–36.0)
MCV: 85.6 fL (ref 78.0–100.0)
Monocytes Absolute: 1.3 10*3/uL — ABNORMAL HIGH (ref 0.1–1.0)
Monocytes Relative: 9 %
Neutro Abs: 11.5 10*3/uL — ABNORMAL HIGH (ref 1.7–7.7)
Neutrophils Relative %: 78 %
PLATELETS: 274 10*3/uL (ref 150–400)
RBC: 3.61 MIL/uL — ABNORMAL LOW (ref 3.87–5.11)
RDW: 15 % (ref 11.5–15.5)
WBC: 14.9 10*3/uL — AB (ref 4.0–10.5)

## 2016-12-02 NOTE — Plan of Care (Signed)
Pt is progressing 

## 2016-12-02 NOTE — Care Management Note (Signed)
Case Management Note  Patient Details  Name: Felicia Reynolds MRN: [id number] Date of Birth: 12/25/1980  Subjective/Objective:        Admitted with pyelonephritis. Chart reviewed for CM needs. PT is from home. She does not live alone. She has PCP, insurance with drug coverage and transportation to appointments.            Action/Plan: DC home with self care. No CM needs noted at this time. CM may be consulted if needs arise.   Expected Discharge Date:    11/30/ 2018             Expected Discharge Plan:  Home/Self Care  In-House Referral:  NA  Discharge planning Services  NA  Post Acute Care Choice:  NA Choice offered to:  NA  Status of Service:  Completed, signed off   Sherald Barge, RN 12/02/2016, 1:05 PM

## 2016-12-02 NOTE — Progress Notes (Signed)
PROGRESS NOTE    Felicia Reynolds  WPY:[id number]  DOB: April 15, 1980  DOA: 11/29/2016 PCP: Monico Blitz, MD   Brief Admission Hx: Felicia Reynolds is a 36 y.o. female with multiple medical issues including diabetes mellitus poorly controlled with A1c>13, bipolar disorder, fibromyalgia, hypertension, osteoporosis, panic attacks, admitted yesterday for management of a probable UTI as well as significant perinephric fluid collection.    MDM/Assessment & Plan:   1. Left Pyelonephritis - with possible subcapsular abscess - continue IV levaquin with plans to complete a full 14 day course.  Interestingly, urine culture is negative.  Had a fever of 100.4 overnight.  Would like to keep an additional 24 hours to be sure she does not spike another temperature, if she does will need to be reimaged, if she does not spike a temperature can DC home tomorrow to complete a 14-day course of oral Levaquin.  Appreciate urology input and recommendations. 2. 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   Will need close outpatient follow-up with PCP for diabetic management.   3. Vaginal candidiasis - Pt says that symptoms much improved---continue fluconazole.   DVT prophylaxis: heparin Code Status: full  Family Communication: none present at bedside  Disposition Plan: home tomorrow if stable.    Consultants:  urology  Subjective: Patient states she is feeling better, is upset when I tell her that she needs to stay another 24 hours.  Objective: Vitals:   12/01/16 1535 12/01/16 2058 12/02/16 0639 12/02/16 1700  BP: 137/79 133/79 129/75 130/76  Pulse: (!) 111 (!) 110 (!) 110   Resp: 20 18 20    Temp: 98.8 F (37.1 C) (!) 100.4 F (38 C) 99 F (37.2 C)   TempSrc:  Oral Oral   SpO2: 95% 94% 95%   Weight:      Height:        Intake/Output Summary (Last 24 hours) at 12/02/2016 1814 Last data filed at  12/02/2016 1700 Gross per 24 hour  Intake 3722.41 ml  Output 850 ml  Net 2872.41 ml   Filed Weights   11/29/16 1937 11/30/16 0344  Weight: 97.5 kg (215 lb) 111.2 kg (245 lb 1.6 oz)     REVIEW OF SYSTEMS  As per history otherwise all reviewed and reported negative  Exam:  General exam: Alert, awake, oriented x 3, morbidly obese Respiratory system: Clear to auscultation. Respiratory effort normal. Cardiovascular system:RRR. No murmurs, rubs, gallops. Gastrointestinal system: Abdomen is nondistended, soft and nontender. No organomegaly or masses felt. Normal bowel sounds heard. Central nervous system: Alert and oriented. No focal neurological deficits. Extremities: No C/C/E, +pedal pulses Skin: No rashes, lesions or ulcers Psychiatry: Judgement and insight appear normal. Mood & affect appropriate.    Data Reviewed: Basic Metabolic Panel: Recent Labs  Lab 11/29/16 2025 11/30/16 0756 12/01/16 0625 12/02/16 0446  NA 124* 128* 131* 133*  K 3.9 3.8 4.1 3.9  CL 87* 93* 98* 100*  CO2 28 27 26 26   GLUCOSE 451* 346* 172* 88  BUN 7 11 13 12   CREATININE 0.74 0.88 0.83 0.69  CALCIUM 8.6* 8.4* 8.1* 8.0*  MG 1.7  --   --   --   PHOS 2.8  --   --   --    Liver Function Tests: Recent Labs  Lab 11/30/16 0756 12/01/16 0625 12/02/16 0446  AST 16 29 25   ALT 12* 18 17  ALKPHOS 147* 305* 412*  BILITOT 0.7  0.7 0.5  PROT 6.3* 6.2* 6.1*  ALBUMIN 2.2* 2.2* 2.0*   Recent Labs  Lab 11/29/16 2025  LIPASE 19   No results for input(s): AMMONIA in the last 168 hours. CBC: Recent Labs  Lab 11/29/16 2025 11/30/16 0756 12/01/16 0625 12/02/16 0446  WBC 17.9* 15.4* 16.8* 14.9*  NEUTROABS 13.4* 11.6* 13.2* 11.5*  HGB 11.9* 10.9* 10.8* 9.8*  HCT 36.9 33.9* 33.8* 30.9*  MCV 84.4 84.3 85.4 85.6  PLT 230 221 264 274   Cardiac Enzymes: No results for input(s): CKTOTAL, CKMB, CKMBINDEX, TROPONINI in the last 168 hours. CBG (last 3)  Recent Labs    12/02/16 0754 12/02/16 1231  12/02/16 1645  GLUCAP 104* 159* 80   Recent Results (from the past 240 hour(s))  Urine culture     Status: None   Collection Time: 11/29/16  8:55 PM  Result Value Ref Range Status   Specimen Description URINE, RANDOM  Final   Special Requests NONE  Final   Culture   Final    NO GROWTH Performed at [facility], [place] [address], [place], [place] [address]    Report Status 12/01/2016 FINAL  Final  Blood culture (routine x 2)     Status: None (Preliminary result)   Collection Time: 11/30/16 12:44 AM  Result Value Ref Range Status   Specimen Description BLOOD LEFT ARM  Final   Special Requests   Final    BOTTLES DRAWN AEROBIC AND ANAEROBIC Blood Culture adequate volume   Culture NO GROWTH 2 DAYS  Final   Report Status PENDING  Incomplete  Blood culture (routine x 2)     Status: None (Preliminary result)   Collection Time: 11/30/16 12:53 AM  Result Value Ref Range Status   Specimen Description BLOOD LEFT ARM  Final   Special Requests   Final    BOTTLES DRAWN AEROBIC AND ANAEROBIC Blood Culture adequate volume   Culture NO GROWTH 2 DAYS  Final   Report Status PENDING  Incomplete     Studies: No results found.   Scheduled Meds: . DULoxetine  60 mg Oral QHS  . fluconazole  150 mg Oral Daily  . heparin  5,000 Units Subcutaneous Q8H  .  HYDROmorphone (DILAUDID) injection  1 mg Intravenous Once  . insulin aspart  0-20 Units Subcutaneous TID WC  . insulin aspart  20 Units Subcutaneous TID WC  . insulin glargine  66 Units Subcutaneous Daily  . polyethylene glycol  17 g Oral BID  . pregabalin  200 mg Oral TID   Continuous Infusions: . sodium chloride 125 mL/hr at 12/02/16 0616  . levofloxacin (LEVAQUIN) IV Stopped (12/02/16 1050)    Principal Problem:   Acute pyelonephritis Active Problems:   Uncontrolled type 2 diabetes mellitus with complication (HCC)   Essential hypertension, benign   Menorrhagia with irregular cycle   Hyponatremia   Vaginal candidiasis   Time  spent:   Lelon Frohlich, MD [facility] Pager [phone]  If 7PM-7AM, please contact night-coverage [email or web address] Password [email or web address] 12/02/2016, 6:14 PM    LOS: 2 days

## 2016-12-03 LAB — COMPREHENSIVE METABOLIC PANEL
ALK PHOS: 385 U/L — AB (ref 38–126)
ALT: 15 U/L (ref 14–54)
AST: 20 U/L (ref 15–41)
Albumin: 1.8 g/dL — ABNORMAL LOW (ref 3.5–5.0)
Anion gap: 6 (ref 5–15)
BUN: 14 mg/dL (ref 6–20)
CHLORIDE: 101 mmol/L (ref 101–111)
CO2: 25 mmol/L (ref 22–32)
CREATININE: 0.69 mg/dL (ref 0.44–1.00)
Calcium: 7.7 mg/dL — ABNORMAL LOW (ref 8.9–10.3)
GFR calc Af Amer: >60 mL/min (ref >60)
GFR calc non Af Amer: >60 mL/min (ref >60)
Glucose, Bld: 302 mg/dL — ABNORMAL HIGH (ref 65–99)
Potassium: 4.5 mmol/L (ref 3.5–5.1)
SODIUM: 132 mmol/L — AB (ref 135–145)
Total Bilirubin: 0.3 mg/dL (ref 0.3–1.2)
Total Protein: 5.7 g/dL — ABNORMAL LOW (ref 6.5–8.1)

## 2016-12-03 LAB — CBC WITH DIFFERENTIAL/PLATELET
BASOS ABS: 0 10*3/uL (ref 0.0–0.1)
Basophils Relative: 0 %
EOS PCT: 0 %
Eosinophils Absolute: 0 10*3/uL (ref 0.0–0.7)
HCT: 29.1 % — ABNORMAL LOW (ref 36.0–46.0)
HEMOGLOBIN: 9.2 g/dL — AB (ref 12.0–15.0)
LYMPHS PCT: 12 %
Lymphs Abs: 1.6 10*3/uL (ref 0.7–4.0)
MCH: 27.1 pg (ref 26.0–34.0)
MCHC: 31.6 g/dL (ref 30.0–36.0)
MCV: 85.8 fL (ref 78.0–100.0)
Monocytes Absolute: 1.1 10*3/uL — ABNORMAL HIGH (ref 0.1–1.0)
Monocytes Relative: 8 %
NEUTROS ABS: 10.7 10*3/uL — AB (ref 1.7–7.7)
NEUTROS PCT: 80 %
PLATELETS: 284 10*3/uL (ref 150–400)
RBC: 3.39 MIL/uL — AB (ref 3.87–5.11)
RDW: 15.2 % (ref 11.5–15.5)
WBC: 13.4 10*3/uL — AB (ref 4.0–10.5)

## 2016-12-03 LAB — GLUCOSE, CAPILLARY
Glucose-Capillary: 274 mg/dL — ABNORMAL HIGH (ref 65–99)
Glucose-Capillary: 273 mg/dL — ABNORMAL HIGH (ref 65–99)

## 2016-12-03 MED ORDER — LEVOFLOXACIN 750 MG PO TABS: 750.0000 mg | ORAL_TABLET | Freq: Every day | ORAL | 0 refills | Status: AC

## 2016-12-03 NOTE — Discharge Summary (Signed)
Physician Discharge Summary  Felicia Reynolds QQP:[id number] DOB: 20-May-1980 DOA: 11/29/2016  PCP: Monico Blitz, MD  Admit date: 11/29/2016 Discharge date: 12/03/2016  Time spent: 45 minutes  Recommendations for Outpatient Follow-up:  -Will be discharged home today. -Advised to follow up with PCP in 2 weeks. -To complete course of levaquin for 14 days.   Discharge Diagnoses:  Principal Problem:   Acute pyelonephritis Active Problems:   Uncontrolled type 2 diabetes mellitus with complication (HCC)   Essential hypertension, benign   Menorrhagia with irregular cycle   Hyponatremia   Vaginal candidiasis   Discharge Condition: Stable and improved  Filed Weights   11/29/16 1937 11/30/16 0344  Weight: 97.5 kg (215 lb) 111.2 kg (245 lb 1.6 oz)    History of present illness:  As per Dr. Olevia Bowens on 11/27: Felicia Reynolds is a 36 y.o. female with medical history significant of anxiety, PTSD, panic attacks, arthritis, type 2 diabetes, fibromyalgia, normal Pap smear, type 2 diabetes, hypertension, neuropathy, obesity, osteoporosis, ovarian cyst, history of pneumonia, vitamin D deficiency is coming to the emergency department with complaints of left-sided flank pain that started suddenly on Wednesday while she was walking in the mall with her daughter.  She describes the pain as very intense, to the point that he brought her down to her knees.  On Wednesday evening the patient had a fever and 6 episodes of emesis.  Since then she has had a fever almost every day, had 9 episodes of emesis on Thanksgiving day.  She had a couple of episodes of vomiting over the weekend, but states that she has not vomited any further, since she has pretty much stopped eating, fearful the oral intake will trigger emesis.  She denies chest pain, palpitations, diaphoresis, pitting edema of the lower extremities, diarrhea, constipation, melena or hematochezia. She also complains of dysuria, pelvic pain, inguinal  itching and whitish vaginal discharge.  ED Course: Initial vital signs temperature 37.41F, pulse 109, respirations 20, blood pressure 127/77 and O2 sat 95% on room air.  She was given IV fluids and Zosyn in the emergency department.  Blood cultures x2's were taken.  Urinalysis shows glucosuria, mild hemoglobinuria and proteinuria.  She has 0-5 RBCs and 0-5 WBC with rare bacteria on microscopic exam.  However, but budding yeast are present.      Hospital Course:   1. Left Pyelonephritis - with possible subcapsular abscess -blood/urine cultures without growth.  Seen in consultation by urology.  Since she has defervesced plan to continue 14 days of antibiotic therapy, no need for intervention. 2. Uncontrolled type 2 DM with neuro complications -continue Lantus  Will need close outpatient follow-up with PCP for diabetic management.   3. Vaginal candidiasis - Pt says that symptoms much improved on fluconazole.     Procedures:  None   Consultations:  Urology  Discharge Instructions  Discharge Instructions    Diet - low sodium heart healthy   Complete by:  As directed    Increase activity slowly   Complete by:  As directed      Allergies as of 12/03/2016      Reactions   Latex Itching, Rash      Medication List    STOP taking these medications   cephALEXin 500 MG capsule Commonly known as:  KEFLEX   ciprofloxacin-dexamethasone OTIC suspension Commonly known as:  CIPRODEX   cyclobenzaprine 10 MG tablet Commonly known as:  FLEXERIL   fluconazole 150 MG tablet Commonly known as:  DIFLUCAN  HYDROcodone-acetaminophen 10-325 MG tablet Commonly known as:  NORCO   terconazole 0.4 % vaginal cream Commonly known as:  TERAZOL 7     TAKE these medications   bismuth subsalicylate 008 QP/61PJ suspension Commonly known as:  PEPTO BISMOL Take 30 mLs by mouth daily as needed for indigestion.   DULoxetine 60 MG capsule Commonly known as:  CYMBALTA Take 60 mg by mouth at  bedtime.   insulin aspart 100 UNIT/ML injection Commonly known as:  novoLOG Inject 10 Units 3 (three) times daily before meals into the skin.   INSULIN SYRINGE .3CC/29GX1/2" 29G X 1/2" 0.3 ML Misc 1 Syringe 3 (three) times daily by Does not apply route.   LANTUS SOLOSTAR 100 UNIT/ML Solostar Pen Generic drug:  Insulin Glargine Inject 60 Units into the skin at bedtime.   levofloxacin 750 MG tablet Commonly known as:  LEVAQUIN Take 1 tablet (750 mg total) by mouth daily for 14 days.   megestrol 40 MG tablet Commonly known as:  MEGACE Take 40 mg by mouth 3 (three) times daily as needed (regulate period).   naproxen sodium 220 MG tablet Commonly known as:  ALEVE Take 220 mg by mouth daily as needed (pain).   pregabalin 200 MG capsule Commonly known as:  LYRICA Take 200 mg by mouth 3 (three) times daily.   tiZANidine 4 MG tablet Commonly known as:  ZANAFLEX Take 4 mg by mouth 3 (three) times daily.   traZODone 100 MG tablet Commonly known as:  DESYREL Take 100 mg by mouth at bedtime.   VOLTAREN 1 % Gel Generic drug:  diclofenac sodium Apply 2 g topically 4 (four) times daily as needed (pain).      Allergies  Allergen Reactions  . Latex Itching and Rash      The results of significant diagnostics from this hospitalization (including imaging, microbiology, ancillary and laboratory) are listed below for reference.    Significant Diagnostic Studies: Ct Abdomen Pelvis W Contrast  Result Date: 11/30/2016 CLINICAL DATA:  Left-sided abdominal pain. EXAM: CT ABDOMEN AND PELVIS WITH CONTRAST TECHNIQUE: Multidetector CT imaging of the abdomen and pelvis was performed using the standard protocol following bolus administration of intravenous contrast. CONTRAST:  190mL ISOVUE-300 IOPAMIDOL (ISOVUE-300) INJECTION 61% COMPARISON:  CT 08/15/2012 FINDINGS: Lower chest: Atelectasis and trace pleural effusion noted at the left lung base, likely reactive. Hepatobiliary: The liver is  enlarged spanning 23 cm cranial caudal. No focal lesion. Layering sludge or stones in the gallbladder, no pericholecystic inflammation. No biliary dilatation. Pancreas: No ductal dilatation or inflammation. Spleen: Splenomegaly measuring 15.5 cm craniocaudal. Adrenals/Urinary Tract: 2.1 cm right adrenal nodule is low-density and consistent with adenoma. No left adrenal nodule. Significant left perinephric edema with heterogeneous subcapsular fluid collection causing mild mass-effect on the renal parenchyma. Fluid collection measures up to 2.2 cm in depth posteriorly. There is are some enhancing septations. No evidence of intrarenal fluid collection. Inflammatory stranding tracks along the course the left ureter into the pelvis. Heterogeneity of the renal parenchyma on delayed phase imaging, was ill-defined low density in the lower pole renal parenchyma. No hydronephrosis. Homogeneous enhancement of the right kidney. Urinary bladder is physiologically distended, small amount of air in the nondependent bladder. No air in the bladder wall. Stomach/Bowel: Stomach is within normal limits. Appendix appears normal. No evidence of bowel wall thickening, distention, or inflammatory changes. No significant diverticular disease. Vascular/Lymphatic: Prominent retroperitoneal nodes at the level of the left kidney are likely reactive. No pelvic adenopathy. Trace aorta bi-iliac atherosclerosis. Reproductive: Bullous  appearance of the uterus. Unremarkable adnexa. Other: No free air. No free fluid at this and perinephric edema. 2.5 cm subcutaneous cyst in the right lateral upper abdominal wall, likely a sebaceous cyst, minimal increase from prior exam. Subcutaneous calcification of the right lower wall pannus. Musculoskeletal: There are no acute or suspicious osseous abnormalities. IMPRESSION: 1. Edematous left kidney with circumferential subcapsular heterogeneous collection measuring up to 2.2 cm. Some heterogeneity of left renal  parenchyma with ill-defined low density in the lower pole. Findings are suspicious for a subcapsular abscess and pyelonephritis. Subcapsular hematoma is considered but felt less likely. 2. Hepatosplenomegaly. Sludge or noncalcified stones in the gallbladder without gallbladder inflammation. 3. Mild aorta bi-iliac atherosclerosis, age advanced. Electronically Signed   By: Jeb Levering M.D.   On: 11/30/2016 00:23    Microbiology: Recent Results (from the past 240 hour(s))  Urine culture     Status: None   Collection Time: 11/29/16  8:55 PM  Result Value Ref Range Status   Specimen Description URINE, RANDOM  Final   Special Requests NONE  Final   Culture   Final    NO GROWTH Performed at [facility], [address] [place], [place] [address]    Report Status 12/01/2016 FINAL  Final  Blood culture (routine x 2)     Status: None (Preliminary result)   Collection Time: 11/30/16 12:44 AM  Result Value Ref Range Status   Specimen Description BLOOD LEFT ARM  Final   Special Requests   Final    BOTTLES DRAWN AEROBIC AND ANAEROBIC Blood Culture adequate volume   Culture NO GROWTH 3 DAYS  Final   Report Status PENDING  Incomplete  Blood culture (routine x 2)     Status: None (Preliminary result)   Collection Time: 11/30/16 12:53 AM  Result Value Ref Range Status   Specimen Description BLOOD LEFT ARM  Final   Special Requests   Final    BOTTLES DRAWN AEROBIC AND ANAEROBIC Blood Culture adequate volume   Culture NO GROWTH 3 DAYS  Final   Report Status PENDING  Incomplete     Labs: Basic Metabolic Panel: Recent Labs  Lab 11/29/16 2025 11/30/16 0756 12/01/16 0625 12/02/16 0446 12/03/16 0418  NA 124* 128* 131* 133* 132*  K 3.9 3.8 4.1 3.9 4.5  CL 87* 93* 98* 100* 101  CO2 28 27 26 26 25   GLUCOSE 451* 346* 172* 88 302*  BUN 7 11 13 12 14   CREATININE 0.74 0.88 0.83 0.69 0.69  CALCIUM 8.6* 8.4* 8.1* 8.0* 7.7*  MG 1.7  --   --   --   --   PHOS 2.8  --   --   --   --     Liver Function Tests: Recent Labs  Lab 11/30/16 0756 12/01/16 0625 12/02/16 0446 12/03/16 0418  AST 16 29 25 20   ALT 12* 18 17 15   ALKPHOS 147* 305* 412* 385*  BILITOT 0.7 0.7 0.5 0.3  PROT 6.3* 6.2* 6.1* 5.7*  ALBUMIN 2.2* 2.2* 2.0* 1.8*   Recent Labs  Lab 11/29/16 2025  LIPASE 19   No results for input(s): AMMONIA in the last 168 hours. CBC: Recent Labs  Lab 11/29/16 2025 11/30/16 0756 12/01/16 0625 12/02/16 0446 12/03/16 0418  WBC 17.9* 15.4* 16.8* 14.9* 13.4*  NEUTROABS 13.4* 11.6* 13.2* 11.5* 10.7*  HGB 11.9* 10.9* 10.8* 9.8* 9.2*  HCT 36.9 33.9* 33.8* 30.9* 29.1*  MCV 84.4 84.3 85.4 85.6 85.8  PLT 230 221 264 274  284   Cardiac Enzymes: No results for input(s): CKTOTAL, CKMB, CKMBINDEX, TROPONINI in the last 168 hours. BNP: BNP (last 3 results) No results for input(s): BNP in the last 8760 hours.  ProBNP (last 3 results) No results for input(s): PROBNP in the last 8760 hours.  CBG: Recent Labs  Lab 12/02/16 1231 12/02/16 1645 12/02/16 2201 12/03/16 0330 12/03/16 0800  GLUCAP 159* 80 303* 274* 273*       Signed:  [facility] Pager: [phone] 12/03/2016, 5:50 PM

## 2016-12-03 NOTE — Progress Notes (Addendum)
Inpatient Diabetes Program Recommendations  AACE/ADA: New Consensus Statement on Inpatient Glycemic Control (2015)  Target Ranges:  Prepandial:   less than 140 mg/dL      Peak postprandial:   less than 180 mg/dL (1-2 hours)      Critically ill patients:  140 - 180 mg/dL   Results for JAKAYLEE, SASAKI (MRN [id number]) as of 12/03/2016 11:05  Ref. Range 12/02/2016 07:54 12/02/2016 12:31 12/02/2016 16:45 12/02/2016 22:01 12/03/2016 03:30 12/03/2016 08:00  Glucose-Capillary Latest Ref Range: 65 - 99 mg/dL 104 (H) 159 (H) 80 303 (H) 274 (H) 273 (H)   Review of Glycemic Control  Diabetes history: DM2 Outpatient Diabetes medications: Lantus 60 units BID, Novolog 10 units TID with meals Current orders for Inpatient glycemic control: Lantus 66 units daily, Novolog 0-20 units TID with meals, Novolog 20 units TID with meals for meal coverage  Inpatient Diabetes Program Recommendations: Correction (SSI): Glucose up to 303 mg/dl at bedtime but no insulin given since no HS scale ordered. Please consider adding Novolog 0-5 units QHS for bedtime correction scale. HgbA1C: A1C 13.4% on 11/30/16 indicating an average glucose of 338 mg/dl over the past 2-3 months. Recommend patient follow up with Dr. Dorris Fetch for assistance with improve DM control. Outpatient DM medications: Recommend increasing Novolog meal coverage patient is taking as an outpatient and may also want to adjust basal insulin (similar to being used as an inpatient) and have patient follow up with Dr. Dorris Fetch.  Thanks, Barnie Alderman, RN, MSN, CDE Diabetes Coordinator Inpatient Diabetes Program [phone] (Team Pager from 8am to 5pm)

## 2016-12-05 LAB — CULTURE, BLOOD (ROUTINE X 2)
CULTURE: NO GROWTH
CULTURE: NO GROWTH
SPECIAL REQUESTS: ADEQUATE
Special Requests: ADEQUATE

## 2016-12-08 ENCOUNTER — Ambulatory Visit (HOSPITAL_COMMUNITY): Admission: RE | Admit: 2016-12-08 | Payer: Self-pay | Source: Ambulatory Visit | Admitting: Obstetrics & Gynecology

## 2016-12-08 ENCOUNTER — Encounter (HOSPITAL_COMMUNITY): Admission: RE | Payer: Self-pay | Source: Ambulatory Visit

## 2016-12-08 SURGERY — DILATATION & CURETTAGE/HYSTEROSCOPY WITH NOVASURE ABLATION
Anesthesia: General

## 2016-12-16 ENCOUNTER — Encounter: Payer: Medicaid Other | Admitting: Obstetrics & Gynecology

## 2016-12-30 ENCOUNTER — Other Ambulatory Visit: Payer: Self-pay | Admitting: Orthopedic Surgery

## 2017-01-14 ENCOUNTER — Other Ambulatory Visit (HOSPITAL_BASED_OUTPATIENT_CLINIC_OR_DEPARTMENT_OTHER): Payer: Self-pay

## 2017-01-14 DIAGNOSIS — G4733 Obstructive sleep apnea (adult) (pediatric): Secondary | ICD-10-CM

## 2017-01-21 ENCOUNTER — Other Ambulatory Visit: Payer: Self-pay | Admitting: Obstetrics & Gynecology

## 2017-01-21 ENCOUNTER — Other Ambulatory Visit: Payer: Self-pay | Admitting: Orthopedic Surgery

## 2017-02-01 ENCOUNTER — Ambulatory Visit: Payer: Medicaid Other | Attending: Neurology | Admitting: Neurology

## 2017-02-01 DIAGNOSIS — G4733 Obstructive sleep apnea (adult) (pediatric): Secondary | ICD-10-CM | POA: Insufficient documentation

## 2017-02-01 DIAGNOSIS — R0689 Other abnormalities of breathing: Secondary | ICD-10-CM | POA: Insufficient documentation

## 2017-02-05 NOTE — Procedures (Signed)
Harrold A. Merlene Laughter, MD     [email or web address]             NOCTURNAL POLYSOMNOGRAPHY   LOCATION: [facility]   Patient Name: Felicia Reynolds, Felicia Reynolds Date: 02/01/2017 Gender: Female D.O.B: April 26, 1980 Age (years): 28 Referring Provider: Barton Fanny NP Height (inches): 66 Interpreting Physician: Phillips Odor MD, ABSM Weight (lbs): 221 RPSGT: Asianae, Minkler BMI: 36 MRN: [id number] Neck Size: 16.00 CLINICAL INFORMATION Sleep Study Type: NPSG     Indication for sleep study: N/A     Epworth Sleepiness Score: 10     SLEEP STUDY TECHNIQUE As per the AASM Manual for the Scoring of Sleep and Associated Events v2.3 (April 2016) with a hypopnea requiring 4% desaturations.  The channels recorded and monitored were frontal, central and occipital EEG, electrooculogram (EOG), submentalis EMG (chin), nasal and oral airflow, thoracic and abdominal wall motion, anterior tibialis EMG, snore microphone, electrocardiogram, and pulse oximetry.  MEDICATIONS Medications self-administered by patient taken the night of the study : N/A  Current Outpatient Medications:  .  bismuth subsalicylate (PEPTO BISMOL) 262 MG/15ML suspension, Take 30 mLs by mouth daily as needed for indigestion., Disp: , Rfl:  .  cyclobenzaprine (FLEXERIL) 10 MG tablet, Take 1 tablet (10 mg total) by mouth 3 (three) times daily., Disp: 40 tablet, Rfl: 0 .  cyclobenzaprine (FLEXERIL) 10 MG tablet, TAKE (1) TABLET BY MOUTH 3 TIMES DAILY AS NEEDED., Disp: 40 tablet, Rfl: 0 .  diclofenac sodium (VOLTAREN) 1 % GEL, Apply 2 g topically 4 (four) times daily as needed (pain). , Disp: , Rfl:  .  DULoxetine (CYMBALTA) 60 MG capsule, Take 60 mg by mouth at bedtime., Disp: , Rfl:  .  insulin aspart (NOVOLOG) 100 UNIT/ML injection, Inject 10 Units 3 (three) times daily before meals into the skin., Disp: 10 mL, Rfl: 12 .  Insulin Syringe-Needle U-100 (INSULIN SYRINGE .3CC/29GX1/2") 29G X 1/2" 0.3 ML MISC, 1  Syringe 3 (three) times daily by Does not apply route., Disp: 100 each, Rfl: 3 .  LANTUS SOLOSTAR 100 UNIT/ML Solostar Pen, INJECT 60 UNITS UNDER THE SKIN AT BEDTIME., Disp: 15 mL, Rfl: 11 .  megestrol (MEGACE) 40 MG tablet, Take 40 mg by mouth 3 (three) times daily as needed (regulate period). , Disp: , Rfl:  .  naproxen sodium (ALEVE) 220 MG tablet, Take 220 mg by mouth daily as needed (pain)., Disp: , Rfl:  .  pregabalin (LYRICA) 200 MG capsule, Take 200 mg by mouth 3 (three) times daily. , Disp: , Rfl:  .  tiZANidine (ZANAFLEX) 4 MG tablet, Take 4 mg by mouth 3 (three) times daily., Disp: , Rfl:  .  traZODone (DESYREL) 100 MG tablet, Take 100 mg by mouth at bedtime., Disp: , Rfl:      SLEEP ARCHITECTURE The study was initiated at 9:32:30 PM and ended at 4:47:01 AM.  Sleep onset time was 24.7 minutes and the sleep efficiency was 81.3%. The total sleep time was 353.3 minutes.  Stage REM latency was 320.5 minutes.  The patient spent 3.82% of the night in stage N1 sleep, 56.98% in stage N2 sleep, 33.26% in stage N3 and 5.94% in REM.  Alpha intrusion was absent.  Supine sleep was 50.72%.  RESPIRATORY PARAMETERS The overall apnea/hypopnea index (AHI) was 2.2 per hour. There were 0 total apneas, including 0 obstructive, 0 central and 0 mixed apneas. There were 13 hypopneas and 0 RERAs.  The AHI during Stage REM sleep was 34.3 per hour.  AHI while supine  was 4.4 per hour.  The mean oxygen saturation was 86.15%. The minimum SpO2 during sleep was 60.00%. The total with oxygen saturation below 88% is 330 minutes.   moderate snoring was noted during this study.  CARDIAC DATA The 2 lead EKG demonstrated sinus rhythm. The mean heart rate was N/A beats per minute. Other EKG findings include: PVCs. LEG MOVEMENT DATA The total PLMS were 0 with a resulting PLMS index of 0.00. Associated arousal with leg movement index was 0.0.  IMPRESSIONS 1. Marked hypoventilation syndrome is documented.  Supplemental nocturnal oxygen 2 Liters is recommended.   Delano Metz, MD Diplomate, American Board of Sleep Medicine.  ELECTRONICALLY SIGNED ON:  02/05/2017, 3:05 PM [place] PH: [phone]   FX: [phone] [place]

## 2017-02-15 ENCOUNTER — Other Ambulatory Visit: Payer: Self-pay | Admitting: Adult Health

## 2017-03-22 ENCOUNTER — Encounter: Payer: Self-pay | Admitting: Adult Health

## 2017-03-22 ENCOUNTER — Ambulatory Visit (INDEPENDENT_AMBULATORY_CARE_PROVIDER_SITE_OTHER): Payer: Medicaid Other | Admitting: Adult Health

## 2017-03-22 ENCOUNTER — Other Ambulatory Visit: Payer: Self-pay | Admitting: Adult Health

## 2017-03-22 ENCOUNTER — Ambulatory Visit: Payer: Medicaid Other | Admitting: Adult Health

## 2017-03-22 ENCOUNTER — Other Ambulatory Visit: Payer: Self-pay | Admitting: Orthopedic Surgery

## 2017-03-22 VITALS — BP 174/92 | HR 108 | Ht 66.6 in | Wt 234.5 lb

## 2017-03-22 DIAGNOSIS — N898 Other specified noninflammatory disorders of vagina: Secondary | ICD-10-CM | POA: Diagnosis not present

## 2017-03-22 DIAGNOSIS — B3731 Acute candidiasis of vulva and vagina: Secondary | ICD-10-CM

## 2017-03-22 DIAGNOSIS — N921 Excessive and frequent menstruation with irregular cycle: Secondary | ICD-10-CM | POA: Diagnosis not present

## 2017-03-22 DIAGNOSIS — B373 Candidiasis of vulva and vagina: Secondary | ICD-10-CM | POA: Diagnosis not present

## 2017-03-22 DIAGNOSIS — B369 Superficial mycosis, unspecified: Secondary | ICD-10-CM

## 2017-03-22 LAB — POCT WET PREP (WET MOUNT)

## 2017-03-22 MED ORDER — FLUCONAZOLE 100 MG PO TABS: 100.0000 mg | ORAL_TABLET | Freq: Every day | ORAL | 0 refills | Status: DC

## 2017-03-22 MED ORDER — NYSTATIN-TRIAMCINOLONE 100000-0.1 UNIT/GM-% EX CREA: 1.0000 "application " | TOPICAL_CREAM | Freq: Two times a day (BID) | CUTANEOUS | 3 refills | Status: DC

## 2017-03-22 MED ORDER — MEGESTROL ACETATE 40 MG PO TABS: 40.0000 mg | ORAL_TABLET | Freq: Three times a day (TID) | ORAL | 1 refills | Status: DC | PRN

## 2017-03-22 MED ORDER — OMEPRAZOLE 40 MG PO CPDR: 40.0000 mg | DELAYED_RELEASE_CAPSULE | Freq: Every day | ORAL | 3 refills | Status: DC

## 2017-03-22 NOTE — Progress Notes (Signed)
Subjective:     Patient ID: Felicia Reynolds, female   DOB: [birth year], 37 y.o.   MRN: [id number]  HPI Nick is a 37 year old white female in coming of passing air in vagina when pees and pees often. She says sugars have been off.  PCP is [facility].   Review of Systems Urinary frequency and passes air in vagina +GERD symptoms, burps alot  Periods heavy if not on megace Reviewed past medical,surgical, social and family history. Reviewed medications and allergies.     Objective:   Physical Exam BP (!) 174/92 (BP Location: Left Arm, Patient Position: Sitting, Cuff Size: Large)   Pulse (!) 108   Ht 5' 6.6" (1.692 m)   Wt 234 lb 8 oz (106.4 kg)   BMI 37.17 kg/m    Skin warm and dry.Pelvic: external genitalia is normal in appearance,but red and irritated, vagina: white discharge with odor,urethra has no lesions or masses noted, cervix:smooth and bulbous, uterus: normal size, shape and contour, non tender, no masses felt, adnexa: no masses or tenderness noted. Bladder is non tender and no masses felt. Wet prep: + for yeast and +WBCs.Nuswab obtained.Painted vulva and vagina with gentian violet. Has superficial yeast under panniculus.  Assessment:     1. Vulvovaginal candidiasis   2. Superficial fungus infection of skin   3. Menorrhagia with irregular cycle       Plan:     Nuswab sent Meds ordered this encounter  Medications  . megestrol (MEGACE) 40 MG tablet    Sig: Take 1 tablet (40 mg total) by mouth 3 (three) times daily as needed (regulate period).    Dispense:  90 tablet    Refill:  1    Order Specific Question:   Supervising Provider    Answer:   Elonda Husky, LUTHER H [[id number]]  . DISCONTD: fluconazole (DIFLUCAN) 100 MG tablet    Sig: Take 1 tablet (100 mg total) by mouth daily.    Dispense:  1 tablet    Refill:  0    Order Specific Question:   Supervising Provider    Answer:   EURE, LUTHER H [[id number]]  . fluconazole (DIFLUCAN) 100 MG tablet    Sig: Take 1 tablet (100 mg  total) by mouth daily.    Dispense:  10 tablet    Refill:  0    Order Specific Question:   Supervising Provider    Answer:   Elonda Husky, LUTHER H [[id number]]  . nystatin-triamcinolone (MYCOLOG II) cream    Sig: Apply 1 application topically 2 (two) times daily.    Dispense:  30 g    Refill:  3    Order Specific Question:   Supervising Provider    Answer:   EURE, LUTHER H [[id number]]  . omeprazole (PRILOSEC) 40 MG capsule    Sig: Take 1 capsule (40 mg total) by mouth daily.    Dispense:  30 capsule    Refill:  3    Order Specific Question:   Supervising Provider    Answer:   Elonda Husky, LUTHER H [[id number]]  F/U in 2 weeks

## 2017-03-24 LAB — NUSWAB VAGINITIS PLUS (VG+)
Candida albicans, NAA: POSITIVE — AB
Candida glabrata, NAA: POSITIVE — AB
Chlamydia trachomatis, NAA: NEGATIVE
Neisseria gonorrhoeae, NAA: NEGATIVE
Trich vag by NAA: NEGATIVE

## 2017-04-11 ENCOUNTER — Encounter: Payer: Self-pay | Admitting: Adult Health

## 2017-04-11 ENCOUNTER — Ambulatory Visit: Payer: Medicaid Other | Admitting: Adult Health

## 2017-04-11 VITALS — BP 130/80 | HR 92 | Ht 66.5 in | Wt 233.0 lb

## 2017-04-11 DIAGNOSIS — R319 Hematuria, unspecified: Secondary | ICD-10-CM

## 2017-04-11 DIAGNOSIS — B373 Candidiasis of vulva and vagina: Secondary | ICD-10-CM | POA: Diagnosis not present

## 2017-04-11 DIAGNOSIS — R300 Dysuria: Secondary | ICD-10-CM

## 2017-04-11 DIAGNOSIS — Z803 Family history of malignant neoplasm of breast: Secondary | ICD-10-CM

## 2017-04-11 DIAGNOSIS — B369 Superficial mycosis, unspecified: Secondary | ICD-10-CM | POA: Diagnosis not present

## 2017-04-11 DIAGNOSIS — B3731 Acute candidiasis of vulva and vagina: Secondary | ICD-10-CM

## 2017-04-11 LAB — POCT URINALYSIS DIPSTICK
Leukocytes, UA: NEGATIVE
NITRITE UA: NEGATIVE

## 2017-04-11 NOTE — Progress Notes (Signed)
Subjective:     Patient ID: Felicia Reynolds, female   DOB: [birth year], 38 y.o.   MRN: [id number]  HPI Felicia Reynolds is a 37 year old white female back in follow up on yeast,is itching less but has burning when pees.She is still trying to lose weight and wants panniculectomy   Review of Systems Burns when pees Less itching Has pain with sex at times Reviewed past medical,surgical, social and family history. Reviewed medications and allergies.     Objective:   Physical Exam BP 130/80 (BP Location: Left Arm, Patient Position: Sitting, Cuff Size: Normal)   Pulse 92   Ht 5' 6.5" (1.689 m)   Wt 233 lb (105.7 kg)   BMI 37.04 kg/m   Urine dipstick: 3+glucose,3+protein,trace ketones,moderate blood. Skin warm and dry.+skin fungus under panniculus. Pelvic: external genitalia is normal in appearance,less red, no excoriation, vagina: pale pink,urethra has no lesions or masses noted, cervix:smooth and bulbous, uterus: normal size, shape and contour, non tender, no masses felt, adnexa: no masses or tenderness noted. Bladder is non tender and no masses felt.   She wants to talk with Dr Elonda Husky about ablation.  Assessment:     1. Vulvovaginal candidiasis   2. Dysuria   3. Superficial fungus infection of skin   4. Hematuria, unspecified type   5. Family history of breast cancer in mother       Plan:     Mammogram 4/12 at 10 am [facility] Return in 2 weeks to talk with Dr Elonda Husky about ablation UA C&S sent Try to keep dry under panniculus  Try different position with sex

## 2017-04-12 LAB — MICROSCOPIC EXAMINATION
Casts: NONE SEEN /lpf
Epithelial Cells (non renal): >10 /hpf — AB (ref 0–10)

## 2017-04-12 LAB — URINALYSIS, ROUTINE W REFLEX MICROSCOPIC
Bilirubin, UA: NEGATIVE
KETONES UA: NEGATIVE
LEUKOCYTES UA: NEGATIVE
Nitrite, UA: NEGATIVE
RBC, UA: NEGATIVE
Urobilinogen, Ur: 0.2 mg/dL (ref 0.2–1.0)
pH, UA: 5.5 (ref 5.0–7.5)

## 2017-04-13 LAB — URINE CULTURE

## 2017-04-15 ENCOUNTER — Ambulatory Visit (HOSPITAL_COMMUNITY)
Admission: RE | Admit: 2017-04-15 | Discharge: 2017-04-15 | Disposition: A | Discharge status: Home / Self Care | Payer: Medicaid Other | Source: Ambulatory Visit | Attending: Adult Health | Admitting: Adult Health

## 2017-04-15 ENCOUNTER — Encounter (HOSPITAL_COMMUNITY): Payer: Self-pay

## 2017-04-15 DIAGNOSIS — Z1231 Encounter for screening mammogram for malignant neoplasm of breast: Secondary | ICD-10-CM | POA: Insufficient documentation

## 2017-04-15 DIAGNOSIS — Z803 Family history of malignant neoplasm of breast: Secondary | ICD-10-CM

## 2017-04-28 ENCOUNTER — Encounter: Payer: Medicaid Other | Admitting: Obstetrics & Gynecology

## 2017-04-29 ENCOUNTER — Encounter: Payer: Medicaid Other | Admitting: Obstetrics & Gynecology

## 2017-06-20 ENCOUNTER — Ambulatory Visit: Payer: Medicaid Other | Admitting: "Endocrinology

## 2017-07-14 ENCOUNTER — Other Ambulatory Visit (HOSPITAL_COMMUNITY): Payer: Self-pay | Admitting: Neurology

## 2017-07-14 DIAGNOSIS — R531 Weakness: Secondary | ICD-10-CM

## 2017-07-14 DIAGNOSIS — R200 Anesthesia of skin: Secondary | ICD-10-CM

## 2017-07-19 ENCOUNTER — Other Ambulatory Visit: Payer: Self-pay | Admitting: Adult Health

## 2017-07-21 ENCOUNTER — Encounter (HOSPITAL_COMMUNITY): Payer: Self-pay

## 2017-07-21 ENCOUNTER — Ambulatory Visit (HOSPITAL_COMMUNITY): Payer: Medicaid Other

## 2017-07-29 ENCOUNTER — Ambulatory Visit (HOSPITAL_COMMUNITY): Payer: Medicaid Other

## 2017-08-25 ENCOUNTER — Ambulatory Visit: Payer: Medicaid Other | Admitting: Adult Health

## 2017-08-25 ENCOUNTER — Encounter (INDEPENDENT_AMBULATORY_CARE_PROVIDER_SITE_OTHER): Payer: Self-pay

## 2017-08-25 ENCOUNTER — Encounter: Payer: Self-pay | Admitting: Adult Health

## 2017-08-25 VITALS — BP 170/90 | HR 88 | Ht 66.5 in | Wt 249.0 lb

## 2017-08-25 DIAGNOSIS — R300 Dysuria: Secondary | ICD-10-CM

## 2017-08-25 DIAGNOSIS — B373 Candidiasis of vulva and vagina: Secondary | ICD-10-CM

## 2017-08-25 DIAGNOSIS — B369 Superficial mycosis, unspecified: Secondary | ICD-10-CM

## 2017-08-25 DIAGNOSIS — B3731 Acute candidiasis of vulva and vagina: Secondary | ICD-10-CM

## 2017-08-25 LAB — POCT URINALYSIS DIPSTICK
Blood, UA: NEGATIVE
GLUCOSE UA: POSITIVE — AB
Leukocytes, UA: NEGATIVE
NITRITE UA: NEGATIVE
PROTEIN UA: POSITIVE — AB

## 2017-08-25 MED ORDER — GENTIAN VIOLET 1 % EX SOLN: CUTANEOUS | 0 refills | Status: DC

## 2017-08-25 MED ORDER — AMOXICILLIN 500 MG PO CAPS: 500.0000 mg | ORAL_CAPSULE | Freq: Three times a day (TID) | ORAL | 0 refills | Status: DC

## 2017-08-25 MED ORDER — FLUCONAZOLE 100 MG PO TABS
ORAL_TABLET | ORAL | 0 refills | Status: DC
Start: 2017-08-25 — End: 2017-12-20

## 2017-08-25 NOTE — Progress Notes (Addendum)
  Subjective:     Patient ID: Felicia Reynolds, female   DOB: 1980-10-02, 37 y.o.   MRN: [id number]  HPI Zohar is a 37 year old white female in complaining of burning with urination and yeast is back.She says blood sugars are running high in 400's. She says she had stroke about 4 months ago. PCP is Dr Manuella Ghazi.   Review of Systems Burning with urination Hard to void at times Urinary incontinence esp at night Yeast is back on skin and vagina area, sex hurts at times Reviewed past medical,surgical, social and family history. Reviewed medications and allergies.     Objective:   Physical Exam BP (!) 170/90 (BP Location: Left Arm, Patient Position: Sitting, Cuff Size: Normal)   Pulse 88   Ht 5' 6.5" (1.689 m)   Wt 249 lb (112.9 kg)   LMP  (LMP Unknown)   BMI 39.59 kg/m   Urine dipstick +protein and + glucose. Skin warm and dry.Pelvic: external genitalia is red, with white discharge in creases, vagina: red,  without odor,urethra has no lesions or masses noted, cervix:smooth and bulbous, uterus: normal size, shape and contour, non tender, no masses felt, adnexa: no masses or tenderness noted. Bladder is non tender and no masses felt.Under panniculus skin is red and has slight odor.Painted under panniculus and groin and vagina with gentian violet.    Assessment:     1. Superficial fungus infection of skin   2. Dysuria   3. Vulvovaginal candidiasis       Plan:     Meds ordered this encounter  Medications  . fluconazole (DIFLUCAN) 100 MG tablet    Sig: Take 1 daily for 10 days    Dispense:  10 tablet    Refill:  0    Order Specific Question:   Supervising Provider    Answer:   Elonda Husky, LUTHER H [[id number]]  . amoxicillin (AMOXIL) 500 MG capsule    Sig: Take 1 capsule (500 mg total) by mouth 3 (three) times daily.    Dispense:  21 capsule    Refill:  0    Order Specific Question:   Supervising Provider    Answer:   Elonda Husky, LUTHER H [[id number]]  . gentian violet 1 % topical solution    Sig: Apply  every 3 days has needed    Dispense:  30 mL    Refill:  0    Order Specific Question:   Supervising Provider    Answer:   Tania Ade H [[id number]]  F/U in 2 weeks with me  F/U with PCP,Dr Manuella Ghazi about blood sugars and BP

## 2017-08-31 ENCOUNTER — Telehealth: Payer: Self-pay | Admitting: *Deleted

## 2017-08-31 NOTE — Telephone Encounter (Signed)
Pam at [employer] does not cover diapers or chux, pt aware

## 2017-09-08 ENCOUNTER — Ambulatory Visit: Payer: Medicaid Other | Admitting: Adult Health

## 2017-12-20 ENCOUNTER — Ambulatory Visit: Payer: Medicaid Other | Admitting: Adult Health

## 2017-12-20 ENCOUNTER — Encounter (INDEPENDENT_AMBULATORY_CARE_PROVIDER_SITE_OTHER): Payer: Self-pay

## 2017-12-20 ENCOUNTER — Encounter: Payer: Self-pay | Admitting: Adult Health

## 2017-12-20 VITALS — BP 134/81 | HR 96 | Ht 66.0 in | Wt 243.0 lb

## 2017-12-20 DIAGNOSIS — R300 Dysuria: Secondary | ICD-10-CM

## 2017-12-20 DIAGNOSIS — B369 Superficial mycosis, unspecified: Secondary | ICD-10-CM

## 2017-12-20 DIAGNOSIS — R319 Hematuria, unspecified: Secondary | ICD-10-CM | POA: Diagnosis not present

## 2017-12-20 DIAGNOSIS — N946 Dysmenorrhea, unspecified: Secondary | ICD-10-CM | POA: Diagnosis not present

## 2017-12-20 DIAGNOSIS — N926 Irregular menstruation, unspecified: Secondary | ICD-10-CM | POA: Diagnosis not present

## 2017-12-20 LAB — POCT URINALYSIS DIPSTICK
GLUCOSE UA: POSITIVE — AB
LEUKOCYTES UA: NEGATIVE
Nitrite, UA: NEGATIVE
Protein, UA: POSITIVE — AB

## 2017-12-20 MED ORDER — PHENAZOPYRIDINE HCL 200 MG PO TABS: 200.0000 mg | ORAL_TABLET | Freq: Three times a day (TID) | ORAL | 0 refills | Status: DC | PRN

## 2017-12-20 MED ORDER — FLUCONAZOLE 100 MG PO TABS: ORAL_TABLET | ORAL | 0 refills | Status: DC

## 2017-12-20 MED ORDER — NYSTATIN-TRIAMCINOLONE 100000-0.1 UNIT/GM-% EX CREA: 1.0000 "application " | TOPICAL_CREAM | Freq: Two times a day (BID) | CUTANEOUS | 3 refills | Status: DC

## 2017-12-20 MED ORDER — AMOXICILLIN 250 MG/5ML PO SUSR: 500.0000 mg | Freq: Three times a day (TID) | ORAL | 0 refills | Status: DC

## 2017-12-20 NOTE — Progress Notes (Addendum)
Patient ID: Felicia Reynolds, female   DOB: November 11, 1980, 37 y.o.   MRN: [id number] History of Present Illness: Felicia Reynolds is a 37 year old white female in complaining of burning with urination,strong odor in urine,yeast and painful irregular periods and elevated blood sugars. PCP is Dr Felicia Reynolds she sees Dr Felicia Reynolds.    Current Medications, Allergies, Past Medical History, Past Surgical History, Family History and Social History were reviewed in [employer] record.     Review of Systems: Has burning with urination Strong odor in urine +yeast is back Periods irregular and are painful, feels like uterus wants to fall out Blood sugar over 400 this morning, has not had insulin  Has lots of gas and burps taste and smell bad      Physical Exam:BP 134/81 (BP Location: Left Arm, Patient Position: Sitting, Cuff Size: Normal)   Pulse 96   Ht 5\' 6"  (1.676 m)   Wt 243 lb (110.2 kg)   BMI 39.22 kg/m urine dipstick is +blood, protein,glucose and ketones. General:  Well developed, well nourished, no acute distress Skin:  Warm and dry Neck:  Midline trachea, normal thyroid, good ROM, no lymphadenopathy Lungs; Clear to auscultation bilaterally Cardiovascular: Regular rate and rhythm Abdomen:  Soft, non tender,+skin fungus under panniculus, +CVAT on left Pelvic: she declined pelvic exam today Psych:  No mood changes, alert and cooperative,seems happy Fall risk is high. Will get [place] to assess uterus,she says she wants it all out.  Encouraged to go get insulin today and get back on it.   Impression: 1. Dysuria   2. Hematuria, unspecified type   3. Irregular periods   4. Dysmenorrhea   5. Superficial fungus infection of skin       Plan: Meds ordered this encounter  Medications  . amoxicillin (AMOXIL) 250 MG/5ML suspension    Sig: Take 10 mLs (500 mg total) by mouth 3 (three) times daily.    Dispense:  150 mL    Refill:  0    Order Specific Question:   Supervising  Provider    Answer:   Elonda Husky, LUTHER H [[id number]]  . phenazopyridine (PYRIDIUM) 200 MG tablet    Sig: Take 1 tablet (200 mg total) by mouth 3 (three) times daily as needed for pain.    Dispense:  10 tablet    Refill:  0    Order Specific Question:   Supervising Provider    Answer:   Elonda Husky, LUTHER H [[id number]]  . fluconazole (DIFLUCAN) 100 MG tablet    Sig: Take 1 daily for 10 days    Dispense:  10 tablet    Refill:  0    Order Specific Question:   Supervising Provider    Answer:   Elonda Husky, LUTHER H [[id number]]  . nystatin-triamcinolone (MYCOLOG II) cream    Sig: Apply 1 application topically 2 (two) times daily.    Dispense:  30 g    Refill:  3    Order Specific Question:   Supervising Provider    Answer:   Elonda Husky, LUTHER H [[id number]]   UA C&S sent Return in about 2 weeks for GYN [place] and then 2-3 days later to see me

## 2017-12-21 LAB — MICROSCOPIC EXAMINATION
Casts: NONE SEEN /lpf
RBC, UA: >30 /hpf — AB (ref 0–2)
WBC, UA: NONE SEEN /hpf (ref 0–5)

## 2017-12-21 LAB — URINALYSIS, ROUTINE W REFLEX MICROSCOPIC
Bilirubin, UA: NEGATIVE
Ketones, UA: NEGATIVE
Leukocytes, UA: NEGATIVE
NITRITE UA: NEGATIVE
UUROB: 0.2 mg/dL (ref 0.2–1.0)
pH, UA: 5 (ref 5.0–7.5)

## 2017-12-22 ENCOUNTER — Telehealth: Payer: Self-pay | Admitting: Adult Health

## 2017-12-22 LAB — URINE CULTURE

## 2017-12-22 MED ORDER — SULFAMETHOXAZOLE-TRIMETHOPRIM 800-160 MG PO TABS: 1.0000 | ORAL_TABLET | Freq: Two times a day (BID) | ORAL | 0 refills | Status: DC

## 2017-12-22 NOTE — Telephone Encounter (Signed)
Pt aware that urine culture + E coli, will rx septra ds

## 2017-12-29 ENCOUNTER — Emergency Department (HOSPITAL_COMMUNITY)
Admission: EM | Admit: 2017-12-29 | Discharge: 2017-12-29 | Disposition: A | Discharge status: Home / Self Care | Payer: Medicaid Other | Attending: Emergency Medicine | Admitting: Emergency Medicine

## 2017-12-29 ENCOUNTER — Other Ambulatory Visit: Payer: Self-pay

## 2017-12-29 ENCOUNTER — Encounter (HOSPITAL_COMMUNITY): Payer: Self-pay | Admitting: Emergency Medicine

## 2017-12-29 DIAGNOSIS — R1030 Lower abdominal pain, unspecified: Secondary | ICD-10-CM | POA: Diagnosis present

## 2017-12-29 DIAGNOSIS — Z5321 Procedure and treatment not carried out due to patient leaving prior to being seen by health care provider: Secondary | ICD-10-CM | POA: Insufficient documentation

## 2017-12-29 NOTE — ED Triage Notes (Signed)
Patient states she was diagnosed with e-coli in her urine last week and was given prescription but states "I don't have any money to get my medicine so I didn't pick it up." Complaining of lower abdominal pain x 2 weeks.

## 2018-01-03 ENCOUNTER — Other Ambulatory Visit: Payer: Medicaid Other

## 2018-01-09 ENCOUNTER — Ambulatory Visit: Payer: Medicaid Other | Admitting: Adult Health

## 2018-01-25 ENCOUNTER — Other Ambulatory Visit: Payer: Self-pay | Admitting: Obstetrics & Gynecology

## 2018-01-29 ENCOUNTER — Other Ambulatory Visit: Payer: Self-pay

## 2018-01-29 ENCOUNTER — Emergency Department (HOSPITAL_COMMUNITY): Payer: Medicaid Other

## 2018-01-29 ENCOUNTER — Observation Stay (HOSPITAL_BASED_OUTPATIENT_CLINIC_OR_DEPARTMENT_OTHER): Payer: Medicaid Other

## 2018-01-29 ENCOUNTER — Encounter (HOSPITAL_COMMUNITY): Payer: Self-pay

## 2018-01-29 ENCOUNTER — Observation Stay (HOSPITAL_COMMUNITY)
Admission: EM | Admit: 2018-01-29 | Discharge: 2018-01-29 | Discharge status: Against Medical Advice | Payer: Medicaid Other | Attending: Family Medicine | Admitting: Family Medicine

## 2018-01-29 ENCOUNTER — Observation Stay (HOSPITAL_COMMUNITY): Payer: Medicaid Other

## 2018-01-29 DIAGNOSIS — R2689 Other abnormalities of gait and mobility: Secondary | ICD-10-CM | POA: Diagnosis not present

## 2018-01-29 DIAGNOSIS — Z8673 Personal history of transient ischemic attack (TIA), and cerebral infarction without residual deficits: Secondary | ICD-10-CM | POA: Insufficient documentation

## 2018-01-29 DIAGNOSIS — R000 Tachycardia, unspecified: Secondary | ICD-10-CM | POA: Diagnosis not present

## 2018-01-29 DIAGNOSIS — Z9114 Patient's other noncompliance with medication regimen: Secondary | ICD-10-CM | POA: Insufficient documentation

## 2018-01-29 DIAGNOSIS — I10 Essential (primary) hypertension: Secondary | ICD-10-CM | POA: Diagnosis present

## 2018-01-29 DIAGNOSIS — R29818 Other symptoms and signs involving the nervous system: Secondary | ICD-10-CM

## 2018-01-29 DIAGNOSIS — Z794 Long term (current) use of insulin: Secondary | ICD-10-CM | POA: Diagnosis not present

## 2018-01-29 DIAGNOSIS — I119 Hypertensive heart disease without heart failure: Secondary | ICD-10-CM | POA: Diagnosis not present

## 2018-01-29 DIAGNOSIS — F319 Bipolar disorder, unspecified: Secondary | ICD-10-CM | POA: Diagnosis not present

## 2018-01-29 DIAGNOSIS — R739 Hyperglycemia, unspecified: Secondary | ICD-10-CM

## 2018-01-29 DIAGNOSIS — R200 Anesthesia of skin: Principal | ICD-10-CM | POA: Diagnosis present

## 2018-01-29 DIAGNOSIS — E1140 Type 2 diabetes mellitus with diabetic neuropathy, unspecified: Secondary | ICD-10-CM | POA: Insufficient documentation

## 2018-01-29 DIAGNOSIS — E1165 Type 2 diabetes mellitus with hyperglycemia: Secondary | ICD-10-CM | POA: Insufficient documentation

## 2018-01-29 DIAGNOSIS — Z6839 Body mass index (BMI) 39.0-39.9, adult: Secondary | ICD-10-CM | POA: Insufficient documentation

## 2018-01-29 DIAGNOSIS — Z79899 Other long term (current) drug therapy: Secondary | ICD-10-CM | POA: Diagnosis not present

## 2018-01-29 DIAGNOSIS — G8929 Other chronic pain: Secondary | ICD-10-CM | POA: Diagnosis not present

## 2018-01-29 DIAGNOSIS — Z91040 Latex allergy status: Secondary | ICD-10-CM | POA: Insufficient documentation

## 2018-01-29 DIAGNOSIS — F419 Anxiety disorder, unspecified: Secondary | ICD-10-CM | POA: Insufficient documentation

## 2018-01-29 DIAGNOSIS — F17210 Nicotine dependence, cigarettes, uncomplicated: Secondary | ICD-10-CM | POA: Insufficient documentation

## 2018-01-29 DIAGNOSIS — R42 Dizziness and giddiness: Secondary | ICD-10-CM | POA: Diagnosis present

## 2018-01-29 LAB — CBC WITH DIFFERENTIAL/PLATELET
ABS IMMATURE GRANULOCYTES: 0.09 10*3/uL — AB (ref 0.00–0.07)
Basophils Absolute: 0.1 10*3/uL (ref 0.0–0.1)
Basophils Relative: 0 %
Eosinophils Absolute: 0.1 10*3/uL (ref 0.0–0.5)
Eosinophils Relative: 1 %
HCT: 44.2 % (ref 36.0–46.0)
HEMOGLOBIN: 14.3 g/dL (ref 12.0–15.0)
Immature Granulocytes: 1 %
Lymphocytes Relative: 21 %
Lymphs Abs: 3.3 10*3/uL (ref 0.7–4.0)
MCH: 27.2 pg (ref 26.0–34.0)
MCHC: 32.4 g/dL (ref 30.0–36.0)
MCV: 84.2 fL (ref 80.0–100.0)
Monocytes Absolute: 0.8 10*3/uL (ref 0.1–1.0)
Monocytes Relative: 5 %
NEUTROS ABS: 11.4 10*3/uL — AB (ref 1.7–7.7)
NEUTROS PCT: 72 %
Platelets: 253 10*3/uL (ref 150–400)
RBC: 5.25 MIL/uL — ABNORMAL HIGH (ref 3.87–5.11)
RDW: 13.4 % (ref 11.5–15.5)
WBC: 15.8 10*3/uL — ABNORMAL HIGH (ref 4.0–10.5)
nRBC: 0 % (ref 0.0–0.2)

## 2018-01-29 LAB — COMPREHENSIVE METABOLIC PANEL
ALT: 10 U/L (ref 0–44)
AST: 12 U/L — AB (ref 15–41)
Albumin: 3.6 g/dL (ref 3.5–5.0)
Alkaline Phosphatase: 78 U/L (ref 38–126)
Anion gap: 10 (ref 5–15)
BUN: 18 mg/dL (ref 6–20)
CO2: 25 mmol/L (ref 22–32)
Calcium: 9.2 mg/dL (ref 8.9–10.3)
Chloride: 91 mmol/L — ABNORMAL LOW (ref 98–111)
Creatinine, Ser: 0.98 mg/dL (ref 0.44–1.00)
GFR calc Af Amer: >60 mL/min (ref >60)
GFR calc non Af Amer: >60 mL/min (ref >60)
Glucose, Bld: 420 mg/dL — ABNORMAL HIGH (ref 70–99)
POTASSIUM: 3.9 mmol/L (ref 3.5–5.1)
SODIUM: 126 mmol/L — AB (ref 135–145)
Total Bilirubin: 0.8 mg/dL (ref 0.3–1.2)
Total Protein: 7.8 g/dL (ref 6.5–8.1)

## 2018-01-29 LAB — ECHOCARDIOGRAM COMPLETE
Height: 66 in
WEIGHTICAEL: 3840 [oz_av]

## 2018-01-29 LAB — CBG MONITORING, ED
Glucose-Capillary: 380 mg/dL — ABNORMAL HIGH (ref 70–99)
Glucose-Capillary: 270 mg/dL — ABNORMAL HIGH (ref 70–99)
Glucose-Capillary: 299 mg/dL — ABNORMAL HIGH (ref 70–99)

## 2018-01-29 LAB — LACTIC ACID, PLASMA: Lactic Acid, Venous: 1.2 mmol/L (ref 0.5–1.9)

## 2018-01-29 MED ORDER — INSULIN GLARGINE 100 UNIT/ML ~~LOC~~ SOLN
30.0000 [IU] | Freq: Every day | SUBCUTANEOUS | Status: DC
  Filled 2018-01-29: qty 0.3

## 2018-01-29 MED ORDER — DICLOFENAC SODIUM 1 % TD GEL
2.0000 g | Freq: Four times a day (QID) | TRANSDERMAL | Status: DC | PRN
  Filled 2018-01-29: qty 100

## 2018-01-29 MED ORDER — ACETAMINOPHEN 325 MG PO TABS: 650.0000 mg | ORAL_TABLET | Freq: Four times a day (QID) | ORAL | Status: DC | PRN

## 2018-01-29 MED ORDER — ONDANSETRON HCL 4 MG/2ML IJ SOLN: 4.0000 mg | Freq: Four times a day (QID) | INTRAMUSCULAR | Status: DC | PRN

## 2018-01-29 MED ORDER — INSULIN ASPART 100 UNIT/ML ~~LOC~~ SOLN: 0.0000 [IU] | Freq: Every day | SUBCUTANEOUS | Status: DC

## 2018-01-29 MED ORDER — SODIUM CHLORIDE 0.9% FLUSH: 3.0000 mL | Freq: Two times a day (BID) | INTRAVENOUS | Status: DC

## 2018-01-29 MED ORDER — HYDRALAZINE HCL 20 MG/ML IJ SOLN: 10.0000 mg | INTRAMUSCULAR | Status: DC | PRN

## 2018-01-29 MED ORDER — ENOXAPARIN SODIUM 40 MG/0.4ML ~~LOC~~ SOLN
40.0000 mg | SUBCUTANEOUS | Status: DC
  Administered 2018-01-29: 40 mg via SUBCUTANEOUS
  Filled 2018-01-29: qty 0.4

## 2018-01-29 MED ORDER — INSULIN ASPART 100 UNIT/ML ~~LOC~~ SOLN
0.0000 [IU] | Freq: Three times a day (TID) | SUBCUTANEOUS | Status: DC
  Administered 2018-01-29: 8 [IU] via SUBCUTANEOUS
  Filled 2018-01-29: qty 1

## 2018-01-29 MED ORDER — SODIUM CHLORIDE 0.9 % IV BOLUS
1000.0000 mL | Freq: Once | INTRAVENOUS | Status: AC
  Administered 2018-01-29: 1000 mL via INTRAVENOUS

## 2018-01-29 MED ORDER — POLYETHYLENE GLYCOL 3350 17 G PO PACK: 17.0000 g | PACK | Freq: Every day | ORAL | Status: DC | PRN

## 2018-01-29 MED ORDER — STROKE: EARLY STAGES OF RECOVERY BOOK
Freq: Once | Status: DC
  Filled 2018-01-29: qty 1

## 2018-01-29 MED ORDER — SODIUM CHLORIDE 0.9 % IV SOLN
INTRAVENOUS | Status: DC
  Administered 2018-01-29: 07:00:00 via INTRAVENOUS

## 2018-01-29 MED ORDER — ONDANSETRON HCL 4 MG PO TABS: 4.0000 mg | ORAL_TABLET | Freq: Four times a day (QID) | ORAL | Status: DC | PRN

## 2018-01-29 MED ORDER — INSULIN ASPART 100 UNIT/ML ~~LOC~~ SOLN
5.0000 [IU] | Freq: Once | SUBCUTANEOUS | Status: AC
  Administered 2018-01-29: 5 [IU] via INTRAVENOUS
  Filled 2018-01-29: qty 1

## 2018-01-29 MED ORDER — ASPIRIN 300 MG RE SUPP: 300.0000 mg | Freq: Every day | RECTAL | Status: DC

## 2018-01-29 MED ORDER — ASPIRIN 325 MG PO TABS
325.0000 mg | ORAL_TABLET | Freq: Every day | ORAL | Status: DC
  Administered 2018-01-29: 325 mg via ORAL
  Filled 2018-01-29: qty 1

## 2018-01-29 MED ORDER — ACETAMINOPHEN 650 MG RE SUPP: 650.0000 mg | Freq: Four times a day (QID) | RECTAL | Status: DC | PRN

## 2018-01-29 MED ORDER — CARVEDILOL 3.125 MG PO TABS
3.1250 mg | ORAL_TABLET | Freq: Two times a day (BID) | ORAL | Status: DC
  Administered 2018-01-29: 3.125 mg via ORAL
  Filled 2018-01-29 (×3): qty 1

## 2018-01-29 MED ORDER — ONDANSETRON HCL 4 MG/2ML IJ SOLN
4.0000 mg | Freq: Once | INTRAMUSCULAR | Status: AC
  Administered 2018-01-29: 4 mg via INTRAVENOUS
  Filled 2018-01-29: qty 2

## 2018-01-29 MED ORDER — AMLODIPINE BESYLATE 5 MG PO TABS
5.0000 mg | ORAL_TABLET | Freq: Every day | ORAL | Status: DC
  Administered 2018-01-29: 5 mg via ORAL
  Filled 2018-01-29: qty 1

## 2018-01-29 NOTE — ED Notes (Signed)
Pt walked up to nurses desk and states she is leaving. Informed her the Dr. Is supposed to come speak with her.  Pt states she is not waiting any longer she is leaving.  Informed pt of risk including death if she leaves.  Pt states she understands and still wants to leave.

## 2018-01-29 NOTE — H&P (Signed)
History and Physical    Felicia Reynolds QMG:[id number] DOB: [birth year] DOA: 01/29/2018  PCP: Monico Blitz, MD   Patient coming from: Home   Chief Complaint: Lightheaded, dizzy, left-sided numbness   HPI: Felicia Reynolds is a 38 y.o. female with medical history significant for obesity (BMI 39), uncontrolled diabetes mellitus, hypertension, chronic pain, and reported history of CVA at age 60, now presenting to the emergency department for evaluation of lightheadedness, dizziness, left-sided numbness, and general malaise.  Patient reports that for the past 3 to 4 weeks, she has been experiencing lightheadedness as though she may pass out upon standing, also experiencing sensation of the room spinning, and numbness involving her left side but sparing the face.  She reports that the symptoms have been constant and slowly worsening.  She denies headache, reports chronic blurred vision, denies change in hearing, and initially denies focal weakness but later reports some chronic left leg weakness.  She denies any fevers or chills.  Reports that she has not been taking any of her medications due to financial constraints.    ED Course: Upon arrival to the ED, patient is found to be tachycardic to the 110s, hypertensive to 180/90, and vitals otherwise normal.  Noncontrast head CT is negative for acute intracranial abnormality.  Chemistry panel is notable for glucose of 420 with sodium 126, normal bicarbonate, and normal anion gap.  CBC features a leukocytosis to 15,800.  Lactic acid is reassuringly normal.  Patient was given 2 L of normal saline and 5 units IV NovoLog in the ED.  Teleneurologist was consulted by the ED physician and recommended a medical admission with EKG, echocardiogram, carotid ultrasound, aspirin, and MRI which they indicated could be performed when available on 01/30/2018.  Glucose improved to 270, heart rate improved, and the patient will be observed for further evaluation and management of  focal neurologic deficits and uncontrolled diabetes.  Review of Systems:  All other systems reviewed and apart from HPI, are negative.  Past Medical History:  Diagnosis Date  . Anxiety   . Arthritis   . Bipolar disorder ([place])   . Diabetes ([place]) 06/05/2015  . Diabetes mellitus   . Fibromyalgia   . History of abnormal cervical Pap smear 05/29/2015  . History of diabetes mellitus, type II 05/29/2015  . Hypertension   . Neuropathy   . Obesity 05/29/2015  . Osteoporosis   . Ovarian cyst 02/20/2016  . Ovarian cyst 02/20/2016   Simple cyst rt ovary  . Panic attacks   . Pneumonia   . Superficial fungus infection of skin 05/29/2015  . Vitamin D deficiency 06/05/2015    Past Surgical History:  Procedure Laterality Date  . CESAREAN SECTION    . HERNIA REPAIR    . THROAT SURGERY       reports that she has been smoking cigarettes. She has a 6.50 pack-year smoking history. She has never used smokeless tobacco. She reports that she does not drink alcohol or use drugs.  Allergies  Allergen Reactions  . Latex Itching and Rash    Family History  Problem Relation Age of Onset  . Cancer Mother 2       breast  . Breast cancer Mother   . Breast cancer Maternal Aunt   . Breast cancer Maternal Grandmother   . Cancer Maternal Aunt      Prior to Admission medications   Medication Sig Start Date End Date Taking? Authorizing Provider  LANTUS SOLOSTAR 100 UNIT/ML Solostar Pen INJECT 60 UNITS  UNDER THE SKIN AT BEDTIME. 01/26/18  Yes Florian Buff, MD  megestrol (MEGACE) 40 MG tablet Take 1 tablet (40 mg total) by mouth 3 (three) times daily as needed (regulate period). 03/22/17  Yes Derrek Monaco A, NP  MELATONIN PO Take by mouth as needed.   Yes [provider]  pregabalin (LYRICA) 200 MG capsule Take 200 mg by mouth 3 (three) times daily.    Yes [provider]  tiZANidine (ZANAFLEX) 4 MG capsule Take 4 mg by mouth 3 (three) times daily.   Yes [provider]    bismuth subsalicylate (PEPTO BISMOL) 262 MG/15ML suspension Take 30 mLs by mouth daily as needed for indigestion.    [provider]  diazepam (VALIUM) 5 MG tablet Take 5 mg by mouth every 6 (six) hours as needed for anxiety.    [provider]  diclofenac sodium (VOLTAREN) 1 % GEL Apply 2 g topically 4 (four) times daily as needed (pain).     [provider]  gentian violet 1 % topical solution Apply every 3 days has needed Patient not taking: Reported on 12/20/2017 08/25/17   Derrek Monaco A, NP  insulin aspart (NOVOLOG) 100 UNIT/ML injection Inject 10 Units 3 (three) times daily before meals into the skin. 11/19/16   Florian Buff, MD  Insulin Syringe-Needle U-100 (INSULIN SYRINGE .3CC/29GX1/2") 29G X 1/2" 0.3 ML MISC 1 Syringe 3 (three) times daily by Does not apply route. 11/19/16   Florian Buff, MD  metFORMIN (GLUCOPHAGE) 1000 MG tablet TAKE (1) TABLET BY MOUTH TWICE DAILY WITH A MEAL. 07/19/17   Estill Dooms, NP  naproxen sodium (ALEVE) 220 MG tablet Take 220 mg by mouth daily as needed (pain).    [provider]  nystatin-triamcinolone (MYCOLOG II) cream Apply 1 application topically 2 (two) times daily. 12/20/17   Estill Dooms, NP  omeprazole (PRILOSEC) 40 MG capsule Take 1 capsule (40 mg total) by mouth daily. Patient not taking: Reported on 08/25/2017 03/22/17   Estill Dooms, NP  oxycodone-acetaminophen (LYNOX) 10-300 MG tablet Take 1 tablet by mouth every 4 (four) hours as needed for pain.    [provider]    Physical Exam: Vitals:   01/29/18 0303 01/29/18 0304 01/29/18 0307  BP: (!) 180/92    Pulse:  (!) 118   Resp:  20   SpO2:  96%   Weight:   108.9 kg  Height:   5\' 6"  (1.676 m)    Constitutional: NAD, calm  Eyes: PERTLA, lids and conjunctivae normal ENMT: Mucous membranes are moist. Posterior pharynx clear of any exudate or lesions.   Neck: normal, supple, no masses, no thyromegaly Respiratory: clear  to auscultation bilaterally, no wheezing, no crackles. Normal respiratory effort.    Cardiovascular: S1 & S2 heard, regular rate and rhythm. No extremity edema.   Abdomen: No distension, no tenderness, soft. Bowel sounds active.  Musculoskeletal: no clubbing / cyanosis. No joint deformity upper and lower extremities.   Skin: Superficial excoriations and crusted papules about the ankles. Warm, dry, well-perfused. Neurologic: CN 2-12 grossly intact. Mild dysarthria. Sensation to light touch diminished throughout LUE and LLE.  Patellar DTRs intact. Strength 5/5 throughout the right side and LUE, 4/5 involving LLE.  Psychiatric: Alert and oriented to person, place, and situation. Calm, cooperative.    Labs on Admission: I have personally reviewed following labs and imaging studies  CBC: Recent Labs  Lab 01/29/18 0350  WBC 15.8*  NEUTROABS 11.4*  HGB  14.3  HCT 44.2  MCV 84.2  PLT 623   Basic Metabolic Panel: Recent Labs  Lab 01/29/18 0350  NA 126*  K 3.9  CL 91*  CO2 25  GLUCOSE 420*  BUN 18  CREATININE 0.98  CALCIUM 9.2   GFR: Estimated Creatinine Clearance: 96.2 mL/min (by C-G formula based on SCr of 0.98 mg/dL). Liver Function Tests: Recent Labs  Lab 01/29/18 0350  AST 12*  ALT 10  ALKPHOS 78  BILITOT 0.8  PROT 7.8  ALBUMIN 3.6   No results for input(s): LIPASE, AMYLASE in the last 168 hours. No results for input(s): AMMONIA in the last 168 hours. Coagulation Profile: No results for input(s): INR, PROTIME in the last 168 hours. Cardiac Enzymes: No results for input(s): CKTOTAL, CKMB, CKMBINDEX, TROPONINI in the last 168 hours. BNP (last 3 results) No results for input(s): PROBNP in the last 8760 hours. HbA1C: No results for input(s): HGBA1C in the last 72 hours. CBG: Recent Labs  Lab 01/29/18 0303  GLUCAP 380*   Lipid Profile: No results for input(s): CHOL, HDL, LDLCALC, TRIG, CHOLHDL, LDLDIRECT in the last 72 hours. Thyroid Function Tests: No results  for input(s): TSH, T4TOTAL, FREET4, T3FREE, THYROIDAB in the last 72 hours. Anemia Panel: No results for input(s): VITAMINB12, FOLATE, FERRITIN, TIBC, IRON, RETICCTPCT in the last 72 hours. Urine analysis:    Component Value Date/Time   COLORURINE YELLOW 11/29/2016 2055   APPEARANCEUR Cloudy (A) 12/20/2017 1138   LABSPEC 1.026 11/29/2016 2055   PHURINE 5.0 11/29/2016 2055   GLUCOSEU 3+ (A) 12/20/2017 1138   HGBUR SMALL (A) 11/29/2016 2055   BILIRUBINUR Negative 12/20/2017 [place] 11/29/2016 2055   PROTEINUR 2+ (A) 12/20/2017 1138   PROTEINUR 30 (A) 11/29/2016 2055   UROBILINOGEN 0.2 11/28/2011 0642   NITRITE Negative 12/20/2017 1138   NITRITE NEGATIVE 11/29/2016 2055   LEUKOCYTESUR Negative 12/20/2017 1138   Sepsis Labs: @LABRCNTIP (procalcitonin:4,lacticidven:4) )No results found for this or any previous visit (from the past 240 hour(s)).   Radiological Exams on Admission: Ct Head Wo Contrast  Result Date: 01/29/2018 CLINICAL DATA:  Dizziness and numbness to LEFT-sided body for 3 weeks. Numbness to LEFT hand and LEFT leg/foot intermittently for 3 weeks. Past history of stroke affecting LEFT-sided body. EXAM: CT HEAD WITHOUT CONTRAST TECHNIQUE: Contiguous axial images were obtained from the base of the skull through the vertex without intravenous contrast. COMPARISON:  None. FINDINGS: Brain: Ventricles are normal in size and configuration. All areas of the brain demonstrate appropriate gray-white matter differentiation. There is no mass, hemorrhage, edema or other evidence of acute parenchymal abnormality. No extra-axial hemorrhage. Vascular: No hyperdense vessel or unexpected calcification. Skull: Normal. Negative for fracture or focal lesion. Sinuses/Orbits: No acute finding. Other: None. IMPRESSION: Negative head CT. No intracranial mass, hemorrhage or edema. Electronically Signed   By: Franki Cabot M.D.   On: 01/29/2018 04:56    EKG: Ordered, not yet performed.    Assessment/Plan   1. Left-sided numbness; LLE weakness; vertigo  - Presents with vertigo, left-sided numbness, and left leg weakness, all present for 3-4 weeks  - Head CT is negative   - Per recommendations of teleneurologist, will continue check EKG, echocardiogram, carotid US, and MRI, continue neuro checks, PT/OT/SLP evals, start ASA, and check A1c and fasting lipids    2. Uncontrolled diabetes mellitus  - A1c was 13.4% in November 2018  - Has been without insulin for several days d/t financial constraints  - Serum glucose is 420 in  ED without DKA  - She was treated with 5 units IV Novolog and 2 liters NS in ED - Check CBG's, use Lantus and Novolog, consult with case management for possible resources to help her obtain medications    3. Hypertension  - Reports hx of HTN, no antihypertensives noted on med list  - Monitor, use hydralazine IVP's as needed for now, consider scheduling antihypertensive    DVT prophylaxis: Lovenox  Code Status: Full  Family Communication: Discussed with patient  Consults called: Teleneurology consulted by ED physician  Admission status: Observation     Vianne Bulls, MD [facility] Pager [phone]  If 7PM-7AM, please contact night-coverage [email or web address] Password [facility]  01/29/2018, 6:14 AM

## 2018-01-29 NOTE — Progress Notes (Signed)
  Echocardiogram 2D Echocardiogram has been performed.  Bobbye Charleston 01/29/2018, 9:12 AM

## 2018-01-29 NOTE — Progress Notes (Signed)
Patient seen and evaluated, chart reviewed, please see EMR for updated orders. Please see full H&P dictated by admitting physician Dr Myna Hidalgo for same date of service.    1) Gait disturbance, left-sided weakness and possible stroke----CT head unremarkable, 2D echo with preserved EF of 55% without significant wall motion normalities, carotid artery Dopplers without hemodynamically significant stenosis, MRI brain pending, continue aspirin, fasting lipid profile pending  2) stage II hypertension--- given the patient's neuro symptoms have persisted for at least 4 days, and CT head is still negative, low index of suspicion for large CVA, okay to try to control BP, start amlodipine 5 mg daily, Coreg 3.125 mg twice daily  3)DM--- previous A1c was 13.4, repeat A1c pending, patient admits to noncompliance with insulin therapy, even though she apparently has Medicaid--- she tells me she cannot afford her co-pay however patient's co-pay for the whole month is cheaper than a pack of cigarettes and patient smokes half to 1 pack a day, to be compliant with insulin to prevent significant complications emphasized to patient  4) tobacco abuse----smoking cessation advised, patient did not seem interested in quitting smoking at this time   Patient seen and evaluated, chart reviewed, please see EMR for updated orders. Please see full H&P dictated by admitting physician Dr Myna Hidalgo for same date of service.

## 2018-01-29 NOTE — ED Notes (Signed)
Spoke with pt regarding leaving AMA.  States she understands the risk.  Dr. Maurene Capes will come speak with her before she leaves.

## 2018-01-29 NOTE — ED Triage Notes (Signed)
Pt reports feeling dizzy and lightheaded that started yesterday around 4 pm. Pt reports last time she took Lantus was Thursday, pt says Rx is at [facility] but she doesn't have money to get it.

## 2018-01-29 NOTE — ED Provider Notes (Signed)
[facility] EMERGENCY DEPARTMENT Provider Note   CSN: [id number] Arrival date & time: 01/29/18  0251  Time seen 3:20 AM   History   Chief Complaint Chief Complaint  Patient presents with  . Dizziness    HPI Felicia Reynolds is a 38 y.o. female.  HPI patient states she has been a diabetic since she was 38 years old.  She states her diabetes has been very hard to control, she is managed by her primary care doctor.  She does not see an endocrinologist.  She states that she had stopped taking her metformin for about a year because it was making her diarrhea worse, she has not taken her NovoLog insulin in months because she cannot afford it and she states she took her last dose of her Lantus insulin on the 23rd.  She states about 3 weeks ago the whole left side of her body is numb.  She feels like she is weak on that side and states she is falling.  She is using a cane.  She denies numbness of her face.  She also reports she has been having a headache for 4 months which is mainly in the back of her head but moves around from the left to the right.  She describes it as aching and throbbing with photophobia and phonophobia.  She has had nausea and vomiting daily since she started getting the headaches.  She states she cannot eat because of the vomiting.  She also describes some rectal incontinence recently.  She states she had quit smoking while on Chantix and had stopped for a year however she bought a pack of cigarettes today and has smoked 3 cigarettes.  She states her aunt helps her by her Lyrica, her Valium however she is out of refills, her Voltaren gel.  She states she has just run out of her Percocet 10 prescribed by her pain management doctor, Dr. Merlene Laughter.  This is for chronic back pain.  She states she does not want to get back on it.  She describes already going through the withdrawal symptoms.  PCP Monico Blitz, MD   Past Medical History:  Diagnosis Date  . Anxiety   . Arthritis     . Bipolar disorder ([place])   . Diabetes ([place]) 06/05/2015  . Diabetes mellitus   . Fibromyalgia   . History of abnormal cervical Pap smear 05/29/2015  . History of diabetes mellitus, type II 05/29/2015  . Hypertension   . Neuropathy   . Obesity 05/29/2015  . Osteoporosis   . Ovarian cyst 02/20/2016  . Ovarian cyst 02/20/2016   Simple cyst rt ovary  . Panic attacks   . Pneumonia   . Superficial fungus infection of skin 05/29/2015  . Vitamin D deficiency 06/05/2015    Patient Active Problem List   Diagnosis Date Noted  . Left sided numbness 01/29/2018  . Hypertension 01/29/2018  . Hyperglycemia   . Dysuria 08/25/2017  . Acute pyelonephritis 11/30/2016  . Hyponatremia 11/30/2016  . Vulvovaginal candidiasis 11/30/2016  . Urinary frequency 10/29/2016  . Burning with urination 10/29/2016  . Urinary tract infection without hematuria 10/29/2016  . Recurrent acute serous otitis media of right ear 10/29/2016  . Menorrhagia with irregular cycle 10/29/2016  . Uncontrolled type 2 diabetes mellitus with complication ([place]) 02/63/7858  . Personal history of noncompliance with medical treatment, presenting hazards to health 05/05/2016  . Essential hypertension, benign 05/05/2016  . Ovarian cyst 02/20/2016  . Uncontrolled diabetes mellitus with hyperglycemia ([place]) 06/05/2015  .  Vitamin D deficiency 06/05/2015  . Superficial fungus infection of skin 05/29/2015  . Morbid obesity ([place]) 05/29/2015  . History of diabetes mellitus, type II 05/29/2015  . History of abnormal cervical Pap smear 05/29/2015    Past Surgical History:  Procedure Laterality Date  . CESAREAN SECTION    . HERNIA REPAIR    . THROAT SURGERY       OB History    Gravida  6   Para  1   Term  0   Preterm  0   AB  5   Living  1     SAB  4   TAB  0   Ectopic  1   Multiple  0   Live Births               Home Medications    Prior to Admission medications   Medication Sig Start Date End Date Taking?  Authorizing Provider  LANTUS SOLOSTAR 100 UNIT/ML Solostar Pen INJECT 60 UNITS UNDER THE SKIN AT BEDTIME. 01/26/18  Yes Florian Buff, MD  megestrol (MEGACE) 40 MG tablet Take 1 tablet (40 mg total) by mouth 3 (three) times daily as needed (regulate period). 03/22/17  Yes Derrek Monaco A, NP  MELATONIN PO Take by mouth as needed.   Yes [provider]  pregabalin (LYRICA) 200 MG capsule Take 200 mg by mouth 3 (three) times daily.    Yes [provider]  tiZANidine (ZANAFLEX) 4 MG capsule Take 4 mg by mouth 3 (three) times daily.   Yes [provider]  bismuth subsalicylate (PEPTO BISMOL) 262 MG/15ML suspension Take 30 mLs by mouth daily as needed for indigestion.    [provider]  diazepam (VALIUM) 5 MG tablet Take 5 mg by mouth every 6 (six) hours as needed for anxiety.    [provider]  diclofenac sodium (VOLTAREN) 1 % GEL Apply 2 g topically 4 (four) times daily as needed (pain).     [provider]  gentian violet 1 % topical solution Apply every 3 days has needed Patient not taking: Reported on 12/20/2017 08/25/17   Derrek Monaco A, NP  insulin aspart (NOVOLOG) 100 UNIT/ML injection Inject 10 Units 3 (three) times daily before meals into the skin. 11/19/16   Florian Buff, MD  Insulin Syringe-Needle U-100 (INSULIN SYRINGE .3CC/29GX1/2") 29G X 1/2" 0.3 ML MISC 1 Syringe 3 (three) times daily by Does not apply route. 11/19/16   Florian Buff, MD  metFORMIN (GLUCOPHAGE) 1000 MG tablet TAKE (1) TABLET BY MOUTH TWICE DAILY WITH A MEAL. 07/19/17   Estill Dooms, NP  naproxen sodium (ALEVE) 220 MG tablet Take 220 mg by mouth daily as needed (pain).    [provider]  nystatin-triamcinolone (MYCOLOG II) cream Apply 1 application topically 2 (two) times daily. 12/20/17   Estill Dooms, NP  omeprazole (PRILOSEC) 40 MG capsule Take 1 capsule (40 mg total) by mouth daily. Patient not taking: Reported on 08/25/2017 03/22/17    Estill Dooms, NP  oxycodone-acetaminophen (LYNOX) 10-300 MG tablet Take 1 tablet by mouth every 4 (four) hours as needed for pain.    [provider]    Family History Family History  Problem Relation Age of Onset  . Cancer Mother 37       breast  . Breast cancer Mother   . Breast cancer Maternal Aunt   . Breast cancer Maternal Grandmother   . Cancer Maternal Aunt  Social History Social History   Tobacco Use  . Smoking status: Current Some Day Smoker    Packs/day: 0.50    Years: 13.00    Pack years: 6.50    Types: Cigarettes  . Smokeless tobacco: Never Used  Substance Use Topics  . Alcohol use: No    Alcohol/week: 0.0 standard drinks  . Drug use: No     Allergies   Latex   Review of Systems Review of Systems  All other systems reviewed and are negative.    Physical Exam Updated Vital Signs BP (!) 180/92   Pulse (!) 118   Resp 20   Ht 5\' 6"  (1.676 m)   Wt 108.9 kg   LMP 01/19/2018   SpO2 96%   BMI 38.74 kg/m   Vital signs normal except for hypertension and tachycardia   Physical Exam Vitals signs and nursing note reviewed.  Constitutional:      General: She is not in acute distress.    Appearance: Normal appearance. She is well-developed. She is not ill-appearing or toxic-appearing.  HENT:     Head: Normocephalic and atraumatic.     Right Ear: External ear normal.     Left Ear: External ear normal.     Nose: Nose normal. No mucosal edema or rhinorrhea.     Mouth/Throat:     Mouth: Mucous membranes are dry.     Dentition: No dental abscesses.     Pharynx: No uvula swelling.  Eyes:     Conjunctiva/sclera: Conjunctivae normal.     Pupils: Pupils are equal, round, and reactive to light.  Neck:     Musculoskeletal: Full passive range of motion without pain, normal range of motion and neck supple.  Cardiovascular:     Rate and Rhythm: Normal rate and regular rhythm.     Heart sounds: Normal heart sounds. No murmur. No  friction rub. No gallop.   Pulmonary:     Effort: Pulmonary effort is normal. No respiratory distress.     Breath sounds: Normal breath sounds. No wheezing, rhonchi or rales.  Chest:     Chest wall: No tenderness or crepitus.  Abdominal:     General: Bowel sounds are normal. There is no distension.     Palpations: Abdomen is soft.     Tenderness: There is no abdominal tenderness. There is no guarding or rebound.  Musculoskeletal: Normal range of motion.        General: No tenderness.     Comments: Moves all extremities well.   Skin:    General: Skin is warm and dry.     Coloration: Skin is not pale.     Findings: No erythema or rash.  Neurological:     General: No focal deficit present.     Mental Status: She is alert and oriented to person, place, and time.     Cranial Nerves: No cranial nerve deficit.     Comments: Patient denies any numbness to light touch of her face, her grips are equal, the left is weaker however that may be intentional.  Psychiatric:        Mood and Affect: Mood is not anxious.        Speech: Speech normal.        Behavior: Behavior normal.      ED Treatments / Results  Labs (all labs ordered are listed, but only abnormal results are displayed) Results for orders placed or performed during the hospital encounter of 01/29/18  Comprehensive metabolic panel  Result Value Ref Range   Sodium 126 (L) 135 - 145 mmol/L   Potassium 3.9 3.5 - 5.1 mmol/L   Chloride 91 (L) 98 - 111 mmol/L   CO2 25 22 - 32 mmol/L   Glucose, Bld 420 (H) 70 - 99 mg/dL   BUN 18 6 - 20 mg/dL   Creatinine, Ser 0.98 0.44 - 1.00 mg/dL   Calcium 9.2 8.9 - 10.3 mg/dL   Total Protein 7.8 6.5 - 8.1 g/dL   Albumin 3.6 3.5 - 5.0 g/dL   AST 12 (L) 15 - 41 U/L   ALT 10 0 - 44 U/L   Alkaline Phosphatase 78 38 - 126 U/L   Total Bilirubin 0.8 0.3 - 1.2 mg/dL   GFR calc non Af Amer >60 >60 mL/min   GFR calc Af Amer >60 >60 mL/min   Anion gap 10 5 - 15  Lactic acid, plasma  Result Value  Ref Range   Lactic Acid, Venous 1.2 0.5 - 1.9 mmol/L  CBC with Differential  Result Value Ref Range   WBC 15.8 (H) 4.0 - 10.5 K/uL   RBC 5.25 (H) 3.87 - 5.11 MIL/uL   Hemoglobin 14.3 12.0 - 15.0 g/dL   HCT 44.2 36.0 - 46.0 %   MCV 84.2 80.0 - 100.0 fL   MCH 27.2 26.0 - 34.0 pg   MCHC 32.4 30.0 - 36.0 g/dL   RDW 13.4 11.5 - 15.5 %   Platelets 253 150 - 400 K/uL   nRBC 0.0 0.0 - 0.2 %   Neutrophils Relative % 72 %   Neutro Abs 11.4 (H) 1.7 - 7.7 K/uL   Lymphocytes Relative 21 %   Lymphs Abs 3.3 0.7 - 4.0 K/uL   Monocytes Relative 5 %   Monocytes Absolute 0.8 0.1 - 1.0 K/uL   Eosinophils Relative 1 %   Eosinophils Absolute 0.1 0.0 - 0.5 K/uL   Basophils Relative 0 %   Basophils Absolute 0.1 0.0 - 0.1 K/uL   Immature Granulocytes 1 %   Abs Immature Granulocytes 0.09 (H) 0.00 - 0.07 K/uL  CBG monitoring, ED  Result Value Ref Range   Glucose-Capillary 380 (H) 70 - 99 mg/dL  CBG monitoring, ED  Result Value Ref Range   Glucose-Capillary 270 (H) 70 - 99 mg/dL   Laboratory interpretation all normal except hyperglycemia without metabolic acidosis or lactic acidosis, leukocytosis hyponatremia secondary to her hyperglycemia  EKG None  Radiology Ct Head Wo Contrast  Result Date: 01/29/2018 CLINICAL DATA:  Dizziness and numbness to LEFT-sided body for 3 weeks. Numbness to LEFT hand and LEFT leg/foot intermittently for 3 weeks. Past history of stroke affecting LEFT-sided body. EXAM: CT HEAD WITHOUT CONTRAST TECHNIQUE: Contiguous axial images were obtained from the base of the skull through the vertex without intravenous contrast. COMPARISON:  None. FINDINGS: Brain: Ventricles are normal in size and configuration. All areas of the brain demonstrate appropriate gray-white matter differentiation. There is no mass, hemorrhage, edema or other evidence of acute parenchymal abnormality. No extra-axial hemorrhage. Vascular: No hyperdense vessel or unexpected calcification. Skull: Normal. Negative  for fracture or focal lesion. Sinuses/Orbits: No acute finding. Other: None. IMPRESSION: Negative head CT. No intracranial mass, hemorrhage or edema. Electronically Signed   By: Franki Cabot M.D.   On: 01/29/2018 04:56    Procedures Procedures (including critical care time)  Medications Ordered in ED Medications  sodium chloride 0.9 % bolus 1,000 mL (0 mLs Intravenous Stopped 01/29/18 0623)  sodium chloride 0.9 % bolus 1,000 mL (  0 mLs Intravenous Stopped 01/29/18 0623)  ondansetron (ZOFRAN) injection 4 mg (4 mg Intravenous Given 01/29/18 0350)  insulin aspart (novoLOG) injection 5 Units (5 Units Intravenous Given 01/29/18 0513)     Initial Impression / Assessment and Plan / ED Course  I have reviewed the triage vital signs and the nursing notes.  Pertinent labs & imaging results that were available during my care of the patient were reviewed by me and considered in my medical decision making (see chart for details).     IV was ordered and patient was given IV fluids, she appeared to be dehydrated.  Laboratory testing was ordered to evaluate the state of her diabetes, concern was for DKA and lactic acidosis although she has no shortness of breath, abdominal pain, or chest pain.  She also denies nausea or vomiting.   IV was inserted and patient was given 2 L of normal saline.  CT of the head was done to look for lacunar stroke.  4:50 AM patient's labs have resulted, she has hyperglycemia without metabolic acidosis or lactic acidosis.  She was given insulin and 5 units regular IV.  Patient told x-ray staff she had had a stroke before.  I do not see where she has had any prior MRIs.  She is not on any medication for stroke such as aspirin or Plavix.  05:48 AM D/W Teleneurology, Dr Carron Brazen,   Feels needs admission, start ASA, Echo, [place], can wait to get MR until tomorrow since symptoms are 31 weeks old. Concern for MS, PFO, underlying stroke.   I discussed recommendation by the neurologist with  the patient.  She is agreeable to be admitted.  She states she had the stroke when she was 38 years old and living in [place].  She states they told her it was from her hypertension.  6:06 AM Dr. Myna Hidalgo, hospitalist will admit  Final Clinical Impressions(s) / ED Diagnoses   Final diagnoses:  Hyperglycemia  Left sided numbness  Noncompliance with medication regimen    Plan admission  Rolland Porter, MD, Barbette Or, MD 01/29/18 [phone]

## 2018-01-29 NOTE — Discharge Summary (Signed)
Felicia Reynolds, is a 38 y.o. female  DOB 07-Jan-1980  MRN [id number].  Admission date:  01/29/2018  Admitting Physician  Vianne Bulls, MD  Discharge Date:  01/29/2018   Primary MD  Monico Blitz, MD  Recommendations for primary care physician for things to follow:   Patient left [place] despite advice to the contrary   Admission Diagnosis  dizzy and light headed   Discharge Diagnosis  dizzy and light headed    Principal Problem:   Left sided numbness Active Problems:   Uncontrolled diabetes mellitus with hyperglycemia ([place])   Hypertension      Past Medical History:  Diagnosis Date  . Anxiety   . Arthritis   . Bipolar disorder ([place])   . Diabetes ([place]) 06/05/2015  . Diabetes mellitus   . Fibromyalgia   . History of abnormal cervical Pap smear 05/29/2015  . History of diabetes mellitus, type II 05/29/2015  . Hypertension   . Neuropathy   . Obesity 05/29/2015  . Osteoporosis   . Ovarian cyst 02/20/2016  . Ovarian cyst 02/20/2016   Simple cyst rt ovary  . Panic attacks   . Pneumonia   . Superficial fungus infection of skin 05/29/2015  . Vitamin D deficiency 06/05/2015    Past Surgical History:  Procedure Laterality Date  . CESAREAN SECTION    . HERNIA REPAIR    . THROAT SURGERY         HPI  from the history and physical done on the day of admission:    Chief Complaint: Lightheaded, dizzy, left-sided numbness   HPI: Felicia Reynolds is a 38 y.o. female with medical history significant for obesity (BMI 39), uncontrolled diabetes mellitus, hypertension, chronic pain, and reported history of CVA at age 57, now presenting to the emergency department for evaluation of lightheadedness, dizziness, left-sided numbness, and general malaise.  Patient reports that for the past 3 to 4 weeks, she has been experiencing lightheadedness as though she may pass out upon standing, also  experiencing sensation of the room spinning, and numbness involving her left side but sparing the face.  She reports that the symptoms have been constant and slowly worsening.  She denies headache, reports chronic blurred vision, denies change in hearing, and initially denies focal weakness but later reports some chronic left leg weakness.  She denies any fevers or chills.  Reports that she has not been taking any of her medications due to financial constraints.    ED Course: Upon arrival to the ED, patient is found to be tachycardic to the 110s, hypertensive to 180/90, and vitals otherwise normal.  Noncontrast head CT is negative for acute intracranial abnormality.  Chemistry panel is notable for glucose of 420 with sodium 126, normal bicarbonate, and normal anion gap.  CBC features a leukocytosis to 15,800.  Lactic acid is reassuringly normal.  Patient was given 2 L of normal saline and 5 units IV NovoLog in the ED.  Teleneurologist was consulted by the ED physician and recommended a medical  admission with EKG, echocardiogram, carotid ultrasound, aspirin, and MRI which they indicated could be performed when available on 01/30/2018.  Glucose improved to 270, heart rate improved, and the patient will be observed for further evaluation and management of focal neurologic deficits and uncontrolled diabetes.    Hospital Course:     Brief summary Patient was admitted overnight by Dr. Myna Hidalgo, patient was seen and evaluated by me in ED room  earlier , got a call from RN that patient wants to leave AMA despite being advised of the risk of not completing her stroke work-up----I made my way down to the ED to evaluate patient and talk to patient again--- upon arrival was told that patient had just left stating that she could not wait despite being advised that physician was on his way to talk to her  1) Gait disturbance, left-sided weakness and possible stroke----CT head unremarkable, 2D echo with preserved EF of 55%  without significant wall motion normalities, carotid artery Dopplers without hemodynamically significant stenosis, MRI brain was ordered, , fasting lipid profile was requested--- patient left AMA prior to completion of her work-up,  2) stage II hypertension--- given the patient's neuro symptoms have persisted for at least 4 days, and CT head is still negative,  patient was started on amlodipine 5 mg daily, Coreg 3.125 mg twice daily--- however patient left AGAINST MEDICAL ADVICE declining further interventions  3)DM--- previous A1c was 13.4, repeat A1c pending, patient admits to noncompliance with insulin therapy, even though she apparently has Medicaid--- she tells me she cannot afford her co-pay, however patient's co-pay for insulin for the whole month is cheaper than a pack of cigarettes and patient smokes half to 1 pack each day, patient was advised to be compliant with insulin to prevent significant complications emphasized to patient  4) tobacco abuse----smoking cessation advised, patient did not seem interested in quitting smoking at this time  Patient left AGAINST MEDICAL ADVICE, patient declines further intervention,   Consults obtained -telemetry neurologist  Diet and Activity recommendation:  As advised  Discharge Instructions    Patient left AGAINST MEDICAL ADVICE, patient declines further intervention,   Discharge Medications     Patient left AGAINST MEDICAL ADVICE  Major procedures and Radiology Reports - PLEASE review detailed and final reports for all details, in brief -     Ct Head Wo Contrast  Result Date: 01/29/2018 CLINICAL DATA:  Dizziness and numbness to LEFT-sided body for 3 weeks. Numbness to LEFT hand and LEFT leg/foot intermittently for 3 weeks. Past history of stroke affecting LEFT-sided body. EXAM: CT HEAD WITHOUT CONTRAST TECHNIQUE: Contiguous axial images were obtained from the base of the skull through the vertex without intravenous contrast.  COMPARISON:  None. FINDINGS: Brain: Ventricles are normal in size and configuration. All areas of the brain demonstrate appropriate gray-white matter differentiation. There is no mass, hemorrhage, edema or other evidence of acute parenchymal abnormality. No extra-axial hemorrhage. Vascular: No hyperdense vessel or unexpected calcification. Skull: Normal. Negative for fracture or focal lesion. Sinuses/Orbits: No acute finding. Other: None. IMPRESSION: Negative head CT. No intracranial mass, hemorrhage or edema. Electronically Signed   By: Franki Cabot M.D.   On: 01/29/2018 04:56   US Carotid Bilateral (at [facility] Only)  Result Date: 01/29/2018 CLINICAL DATA:  Left-sided numbness and history of hypertension and diabetes. EXAM: BILATERAL CAROTID DUPLEX ULTRASOUND TECHNIQUE: Pearline Cables scale imaging, color Doppler and duplex ultrasound were performed of bilateral carotid and vertebral arteries in the neck. COMPARISON:  None. FINDINGS: Criteria:  Quantification of carotid stenosis is based on velocity parameters that correlate the residual internal carotid diameter with NASCET-based stenosis levels, using the diameter of the distal internal carotid lumen as the denominator for stenosis measurement. The following velocity measurements were obtained: RIGHT ICA:  85/30 cm/sec CCA:  54/00 cm/sec SYSTOLIC ICA/CCA RATIO:  0.9 ECA:  118 cm/sec LEFT ICA:  97/40 cm/sec CCA:  86/76 cm/sec SYSTOLIC ICA/CCA RATIO:  1.0 ECA:  150 cm/sec RIGHT CAROTID ARTERY: No focal plaque identified. Velocities and waveforms are within normal limits. No evidence of carotid stenosis. RIGHT VERTEBRAL ARTERY: Antegrade flow with normal waveform and velocity. LEFT CAROTID ARTERY: No focal plaque identified. Velocities and waveforms are within normal limits. No evidence of carotid stenosis. LEFT VERTEBRAL ARTERY: Antegrade flow with normal waveform and velocity. IMPRESSION: Normal carotid duplex ultrasound. Electronically Signed   By: Aletta Edouard  M.D.   On: 01/29/2018 10:59    Micro Results   No results found for this or any previous visit (from the past 240 hour(s)).     Today   Subjective    [place] today Patient left AGAINST MEDICAL ADVICE, patient declines further intervention,   Patient has been seen and examined prior to discharge----- Patient left AGAINST MEDICAL ADVICE, patient declines further intervention,   Objective   Blood pressure 136/84, pulse (!) 108, temperature 98.6 F (37 C), resp. rate 20, height 5\' 6"  (1.676 m), weight 108.9 kg, last menstrual period 01/19/2018, SpO2 100 %.   Intake/Output Summary (Last 24 hours) at 01/29/2018 1544 Last data filed at 01/29/2018 1245 Gross per 24 hour  Intake 593.33 ml  Output -  Net 593.33 ml    Exam Gen:- Awake Alert, no acute distress , obese HEENT:- [place].AT, No sclera icterus Neck-Supple Neck,No JVD,.  Lungs-  CTAB , good air movement bilaterally  CV- S1, S2 normal, regular Abd-  +ve B.Sounds, Abd Soft, No tenderness,    Extremity/Skin:- No  edema,   good pulses, excoriation/scratch marks around the ankle especially right ankle and foot apparently from her cat, no evidence of superimposed cellulitis or streaking Psych-affect is appropriate, oriented x3 Neuro-  CN 2-12 grossly intact.  Mostly resolved dysarthria. Sensation to light touch diminished throughout LUE and LLE.  Patellar DTRs intact. Strength 5/5 throughout the right side and LUE, 4/5 involving LLE.    Data Review   CBC w Diff:  Lab Results  Component Value Date   WBC 15.8 (H) 01/29/2018   HGB 14.3 01/29/2018   HGB 15.4 05/29/2015   HCT 44.2 01/29/2018   HCT 46.4 05/29/2015   PLT 253 01/29/2018   PLT 239 05/29/2015   LYMPHOPCT 21 01/29/2018   MONOPCT 5 01/29/2018   EOSPCT 1 01/29/2018   BASOPCT 0 01/29/2018    CMP:  Lab Results  Component Value Date   NA 126 (L) 01/29/2018   NA 137 05/29/2015   K 3.9 01/29/2018   CL 91 (L) 01/29/2018   CO2 25 01/29/2018   BUN 18  01/29/2018   BUN 12 05/29/2015   CREATININE 0.98 01/29/2018   PROT 7.8 01/29/2018   PROT 7.2 05/29/2015   ALBUMIN 3.6 01/29/2018   ALBUMIN 4.0 05/29/2015   BILITOT 0.8 01/29/2018   BILITOT 0.3 05/29/2015   ALKPHOS 78 01/29/2018   AST 12 (L) 01/29/2018   ALT 10 01/29/2018  .   Total Discharge time is about 33 minutes  Roxan Hockey M.D on 01/29/2018 at 3:44 PM  Go to [email or web address] -  for contact info  [facility] - Office  [phone]

## 2018-01-29 NOTE — Progress Notes (Signed)
PT Cancellation Note  Patient Details Name: ANJELINA DUNG MRN: [id number] DOB: 06-14-1980   Cancelled Treatment:    Reason Eval/Treat Not Completed: Medical issues which prohibited therapy  Spoke to patient's RN in the ED, discussed holding therapy at this time as patient's BP is elevated to 189/101 and patient is awaiting an MRI. Plan to evaluate once patient is more medically stable.  Clarene Critchley PT, DPT 10:31 AM, 01/29/18 [phone]

## 2018-01-29 NOTE — ED Notes (Signed)
Pt called this RN in to room stating she wants to leave at this time because "my kids miss me". Notified pt that she would have to sign out AMA and take all risks of further ailment or death d/t not staying. Pt states understanding, admitting MD notified and will come speak with pt.

## 2018-01-29 NOTE — ED Notes (Signed)
hospitalist in room  

## 2018-03-13 ENCOUNTER — Ambulatory Visit: Payer: Medicaid Other | Admitting: Adult Health

## 2018-03-21 ENCOUNTER — Other Ambulatory Visit: Payer: Self-pay

## 2018-03-21 ENCOUNTER — Ambulatory Visit (INDEPENDENT_AMBULATORY_CARE_PROVIDER_SITE_OTHER): Payer: Medicaid Other | Admitting: Obstetrics & Gynecology

## 2018-03-21 ENCOUNTER — Encounter: Payer: Self-pay | Admitting: Obstetrics & Gynecology

## 2018-03-21 VITALS — BP 157/81 | HR 95 | Ht 66.5 in | Wt 247.0 lb

## 2018-03-21 DIAGNOSIS — B373 Candidiasis of vulva and vagina: Secondary | ICD-10-CM | POA: Diagnosis not present

## 2018-03-21 DIAGNOSIS — R81 Glycosuria: Secondary | ICD-10-CM | POA: Diagnosis not present

## 2018-03-21 DIAGNOSIS — R809 Proteinuria, unspecified: Secondary | ICD-10-CM | POA: Diagnosis not present

## 2018-03-21 DIAGNOSIS — B3731 Acute candidiasis of vulva and vagina: Secondary | ICD-10-CM

## 2018-03-21 DIAGNOSIS — R319 Hematuria, unspecified: Secondary | ICD-10-CM | POA: Diagnosis not present

## 2018-03-21 LAB — POCT URINALYSIS DIPSTICK
Glucose, UA: POSITIVE — AB
KETONES UA: NEGATIVE
Nitrite, UA: NEGATIVE
Protein, UA: POSITIVE — AB

## 2018-03-21 MED ORDER — SILVER SULFADIAZINE 1 % EX CREA: TOPICAL_CREAM | CUTANEOUS | 11 refills | Status: DC

## 2018-03-21 MED ORDER — FLUCONAZOLE 100 MG PO TABS: 100.0000 mg | ORAL_TABLET | Freq: Every day | ORAL | 0 refills | Status: DC

## 2018-03-21 NOTE — Progress Notes (Signed)
       Chief Complaint  Patient presents with  . possible yeast infection    can't feel the need to urinate    Blood pressure (!) 157/81, pulse 95, height 5' 6.5" (1.689 m), weight 247 lb (112 kg), last menstrual period 03/14/2018.  38 y.o. [id number] Patient's last menstrual period was 03/14/2018 (approximate). The current method of family planning is none.  Subjective Vaginal discharge for 4  weeks Itching yes Irritation no Odor no Similar to previous yes  Previous treatment diflcuan and GV  Objective Vulva:  normal appearing vulva with no masses, tenderness or lesions, vulvar erythema  Vagina:  normal mucosa, curd-like discharge Cervix:  no bleeding following Pap and no cervical motion tenderness Uterus:  normal size, contour, position, consistency, mobility, non-tender Adnexa: ovaries:present,       Pertinent ROS No burning with urination, frequency or urgency No nausea, vomiting or diarrhea Nor fever chills or other constitutional symptoms   Labs or studies Wet Prep:   A sample of vaginal discharge was obtained from the posterior fornix using a cotton swab. 2 drops of saline were placed on a slide and the cotton swab was immersed in the saline. Microscopic evaluation was performed and results were as follows:  Positive  for yeast  Negative for clue cells , consistent with Bacterial vaginosis Negative for trichomonas  Normal WBC population   Whiff test: Negative     Impression Diagnoses this Encounter::   ICD-10-CM   1. Vulvovaginal candidiasis B37.3   2. Glucosuria R81 POCT Urinalysis Dipstick  3. Hematuria, unspecified type R31.9 POCT Urinalysis Dipstick  4. Proteinuria, unspecified type R80.9 POCT Urinalysis Dipstick    Established relevant diagnosis(es):   Plan/Recommendations: Meds ordered this encounter  Medications  . fluconazole (DIFLUCAN) 100 MG tablet    Sig: Take 1 tablet (100 mg total) by mouth daily.    Dispense:  14 tablet   Refill:  0  . silver sulfADIAZINE (SILVADENE) 1 % cream    Sig: Use to areas 2-3 times per day    Dispense:  50 g    Refill:  11    Labs or Scans Ordered: Orders Placed This Encounter  Procedures  . POCT Urinalysis Dipstick    Management:: >diflucan for 2 weeks follow up >silvadene for staph infection superficial  Follow up Return in about 3 weeks (around 04/11/2018).      All questions were answered.

## 2018-04-05 ENCOUNTER — Emergency Department (HOSPITAL_COMMUNITY): Payer: Medicaid Other

## 2018-04-05 ENCOUNTER — Encounter (HOSPITAL_COMMUNITY): Payer: Self-pay | Admitting: Emergency Medicine

## 2018-04-05 ENCOUNTER — Other Ambulatory Visit: Payer: Self-pay

## 2018-04-05 ENCOUNTER — Emergency Department (HOSPITAL_COMMUNITY)
Admission: EM | Admit: 2018-04-05 | Discharge: 2018-04-05 | Discharge status: Against Medical Advice | Payer: Medicaid Other | Attending: Emergency Medicine | Admitting: Emergency Medicine

## 2018-04-05 DIAGNOSIS — R739 Hyperglycemia, unspecified: Secondary | ICD-10-CM

## 2018-04-05 DIAGNOSIS — E1165 Type 2 diabetes mellitus with hyperglycemia: Secondary | ICD-10-CM | POA: Diagnosis not present

## 2018-04-05 DIAGNOSIS — Z91040 Latex allergy status: Secondary | ICD-10-CM | POA: Insufficient documentation

## 2018-04-05 DIAGNOSIS — I10 Essential (primary) hypertension: Secondary | ICD-10-CM | POA: Diagnosis not present

## 2018-04-05 DIAGNOSIS — Z794 Long term (current) use of insulin: Secondary | ICD-10-CM | POA: Diagnosis not present

## 2018-04-05 DIAGNOSIS — F17210 Nicotine dependence, cigarettes, uncomplicated: Secondary | ICD-10-CM | POA: Insufficient documentation

## 2018-04-05 DIAGNOSIS — R109 Unspecified abdominal pain: Secondary | ICD-10-CM

## 2018-04-05 DIAGNOSIS — Z79899 Other long term (current) drug therapy: Secondary | ICD-10-CM | POA: Diagnosis not present

## 2018-04-05 DIAGNOSIS — R1031 Right lower quadrant pain: Secondary | ICD-10-CM | POA: Diagnosis present

## 2018-04-05 LAB — CBG MONITORING, ED
Glucose-Capillary: >600 mg/dL (ref 70–99)
Glucose-Capillary: 519 mg/dL (ref 70–99)

## 2018-04-05 LAB — COMPREHENSIVE METABOLIC PANEL
ALT: 7 U/L (ref 0–44)
AST: 9 U/L — ABNORMAL LOW (ref 15–41)
Albumin: 2.5 g/dL — ABNORMAL LOW (ref 3.5–5.0)
Alkaline Phosphatase: 114 U/L (ref 38–126)
Anion gap: 10 (ref 5–15)
BUN: 10 mg/dL (ref 6–20)
CO2: 26 mmol/L (ref 22–32)
Calcium: 8.8 mg/dL — ABNORMAL LOW (ref 8.9–10.3)
Chloride: 90 mmol/L — ABNORMAL LOW (ref 98–111)
Creatinine, Ser: 1.02 mg/dL — ABNORMAL HIGH (ref 0.44–1.00)
GFR calc Af Amer: >60 mL/min (ref >60)
GFR calc non Af Amer: >60 mL/min (ref >60)
Glucose, Bld: 702 mg/dL (ref 70–99)
Potassium: 4 mmol/L (ref 3.5–5.1)
Sodium: 126 mmol/L — ABNORMAL LOW (ref 135–145)
Total Bilirubin: 0.1 mg/dL — ABNORMAL LOW (ref 0.3–1.2)
Total Protein: 6.8 g/dL (ref 6.5–8.1)

## 2018-04-05 LAB — CBC WITH DIFFERENTIAL/PLATELET
Abs Immature Granulocytes: 0.1 10*3/uL — ABNORMAL HIGH (ref 0.00–0.07)
Basophils Absolute: 0 10*3/uL (ref 0.0–0.1)
Basophils Relative: 0 %
Eosinophils Absolute: 0 10*3/uL (ref 0.0–0.5)
Eosinophils Relative: 0 %
HCT: 35 % — ABNORMAL LOW (ref 36.0–46.0)
Hemoglobin: 11.6 g/dL — ABNORMAL LOW (ref 12.0–15.0)
Immature Granulocytes: 1 %
Lymphocytes Relative: 8 %
Lymphs Abs: 1.1 10*3/uL (ref 0.7–4.0)
MCH: 27.7 pg (ref 26.0–34.0)
MCHC: 33.1 g/dL (ref 30.0–36.0)
MCV: 83.5 fL (ref 80.0–100.0)
Monocytes Absolute: 0.8 10*3/uL (ref 0.1–1.0)
Monocytes Relative: 5 %
Neutro Abs: 12.7 10*3/uL — ABNORMAL HIGH (ref 1.7–7.7)
Neutrophils Relative %: 86 %
Platelets: 194 10*3/uL (ref 150–400)
RBC: 4.19 MIL/uL (ref 3.87–5.11)
RDW: 12 % (ref 11.5–15.5)
WBC: 14.7 10*3/uL — ABNORMAL HIGH (ref 4.0–10.5)
nRBC: 0 % (ref 0.0–0.2)

## 2018-04-05 LAB — URINALYSIS, ROUTINE W REFLEX MICROSCOPIC
Bilirubin Urine: NEGATIVE
Glucose, UA: >=500 mg/dL — AB
Ketones, ur: NEGATIVE mg/dL
Leukocytes,Ua: NEGATIVE
Nitrite: NEGATIVE
Protein, ur: 100 mg/dL — AB
Specific Gravity, Urine: 1.025 (ref 1.005–1.030)
pH: 6 (ref 5.0–8.0)

## 2018-04-05 LAB — LIPASE, BLOOD: Lipase: 21 U/L (ref 11–51)

## 2018-04-05 LAB — PREGNANCY, URINE: Preg Test, Ur: NEGATIVE

## 2018-04-05 MED ORDER — SODIUM CHLORIDE 0.9 % IV BOLUS
1000.0000 mL | Freq: Once | INTRAVENOUS | Status: AC
  Administered 2018-04-05: 1000 mL via INTRAVENOUS

## 2018-04-05 MED ORDER — INSULIN ASPART 100 UNIT/ML ~~LOC~~ SOLN
10.0000 [IU] | Freq: Once | SUBCUTANEOUS | Status: AC
  Administered 2018-04-05: 10 [IU] via SUBCUTANEOUS
  Filled 2018-04-05: qty 1

## 2018-04-05 MED ORDER — ONDANSETRON HCL 4 MG/2ML IJ SOLN
4.0000 mg | Freq: Once | INTRAMUSCULAR | Status: AC
  Administered 2018-04-05: 19:00:00 4 mg via INTRAVENOUS
  Filled 2018-04-05: qty 2

## 2018-04-05 MED ORDER — METFORMIN HCL 1000 MG PO TABS: 1000.0000 mg | ORAL_TABLET | Freq: Two times a day (BID) | ORAL | 0 refills | Status: DC

## 2018-04-05 NOTE — ED Notes (Signed)
Date and time results received: 04/05/18 1955   Test: Glucose Critical Value: 702  Name of Provider Notified: Thurnell Garbe, MD

## 2018-04-05 NOTE — Discharge Instructions (Addendum)
Your labs and Ct imaging are stable today except for your extremely elevated blood glucose level as discussed.  This can sometimes be the source of abdominal pain.  It is important for you to get a better control of your blood glucose levels.  Get back on your metformin as I have prescribed and continue taking your lantus.  Call your doctor for a recheck within 1 week, sooner for any worsened symptoms.  You are leaving before you have been fully treated for your symptoms.  Return here if you decide you want to complete your treatment.

## 2018-04-05 NOTE — ED Triage Notes (Signed)
Pt states that she is having right lower quad pain she has urinary freq. She states that it hurts when she pees.

## 2018-04-05 NOTE — ED Provider Notes (Signed)
[facility] EMERGENCY DEPARTMENT Provider Note   CSN: [id number] Arrival date & time: 04/05/18  1748    History   Chief Complaint Chief Complaint  Patient presents with  . Flank Pain    HPI Felicia Reynolds is a 38 y.o. female with a history of diabetes, hypertension,, neuropathy, history of ovarian cysts presenting with a several week history of right flank pain which has been intermittent, described as a twisting cramping sensation which frequently starts in her right flank and radiates through to her right lower quadrant, can be fleeting at other times can last for more than several minutes and is not triggered by any recognized movements, activity.  She does report increased urinary frequency but denies dysuria.  She has also had no hematuria, denies history of kidney stones.  She does have uncontrolled diabetes, last CBG check this morning was in the 600 range.  Her gynecologist Dr. Elonda Husky is in the process of referring her to an endocrinologist with [facility] in [place].  She is taking her daily Lantus but has been unable to afford her sliding scale NovoLog which was not covered by her Medicaid the last time she tried to get this filled.  Additionally she was also taking metformin which she ran out of 1 month ago.  She denies nausea or vomiting, no fevers, chills, cough or chest pain.  She denies diarrhea or constipation.  She has found no alleviators for her symptoms.   The history is provided by the patient.    Past Medical History:  Diagnosis Date  . Anxiety   . Arthritis   . Bipolar disorder ([place])   . Diabetes ([place]) 06/05/2015  . Diabetes mellitus   . Fibromyalgia   . History of abnormal cervical Pap smear 05/29/2015  . History of diabetes mellitus, type II 05/29/2015  . Hypertension   . Neuropathy   . Obesity 05/29/2015  . Osteoporosis   . Ovarian cyst 02/20/2016  . Ovarian cyst 02/20/2016   Simple cyst rt ovary  . Panic attacks   . Pneumonia   . Stroke ([place])   .  Superficial fungus infection of skin 05/29/2015  . Vitamin D deficiency 06/05/2015    Patient Active Problem List   Diagnosis Date Noted  . Left sided numbness 01/29/2018  . Hypertension 01/29/2018  . Hyperglycemia   . Dysuria 08/25/2017  . Acute pyelonephritis 11/30/2016  . Hyponatremia 11/30/2016  . Vulvovaginal candidiasis 11/30/2016  . Urinary frequency 10/29/2016  . Burning with urination 10/29/2016  . Urinary tract infection without hematuria 10/29/2016  . Recurrent acute serous otitis media of right ear 10/29/2016  . Menorrhagia with irregular cycle 10/29/2016  . Uncontrolled type 2 diabetes mellitus with complication ([place]) 70/26/3785  . Personal history of noncompliance with medical treatment, presenting hazards to health 05/05/2016  . Essential hypertension, benign 05/05/2016  . Ovarian cyst 02/20/2016  . Uncontrolled diabetes mellitus with hyperglycemia ([place]) 06/05/2015  . Vitamin D deficiency 06/05/2015  . Superficial fungus infection of skin 05/29/2015  . Morbid obesity ([place]) 05/29/2015  . History of diabetes mellitus, type II 05/29/2015  . History of abnormal cervical Pap smear 05/29/2015    Past Surgical History:  Procedure Laterality Date  . CESAREAN SECTION    . HERNIA REPAIR    . THROAT SURGERY       OB History    Gravida  6   Para  1   Term  0   Preterm  0   AB  5  Living  1     SAB  4   TAB  0   Ectopic  1   Multiple  0   Live Births               Home Medications    Prior to Admission medications   Medication Sig Start Date End Date Taking? Authorizing Provider  bismuth subsalicylate (PEPTO BISMOL) 262 MG/15ML suspension Take 30 mLs by mouth daily as needed for indigestion.   Yes [provider]  cloNIDine (CATAPRES) 0.2 MG tablet Take 0.2 mg by mouth 2 (two) times daily.   Yes [provider]  diclofenac sodium (VOLTAREN) 1 % GEL Apply 2 g topically 4 (four) times daily as needed (pain).    Yes [provider]  LANTUS SOLOSTAR 100 UNIT/ML Solostar Pen INJECT 60 UNITS UNDER THE SKIN AT BEDTIME. Patient taking differently: Inject 60 Units into the skin at bedtime.  01/26/18  Yes Florian Buff, MD  lisinopril (PRINIVIL,ZESTRIL) 20 MG tablet Take 20 mg by mouth daily.    Yes [provider]  megestrol (MEGACE) 40 MG tablet Take 1 tablet (40 mg total) by mouth 3 (three) times daily as needed (regulate period). 03/22/17  Yes Derrek Monaco A, NP  pregabalin (LYRICA) 200 MG capsule Take 200 mg by mouth 3 (three) times daily.    Yes [provider]  tiZANidine (ZANAFLEX) 4 MG capsule Take 4 mg by mouth 3 (three) times daily.   Yes [provider]  fluconazole (DIFLUCAN) 100 MG tablet Take 1 tablet (100 mg total) by mouth daily. Patient not taking: Reported on 04/05/2018 03/21/18   Florian Buff, MD  metFORMIN (GLUCOPHAGE) 1000 MG tablet Take 1 tablet (1,000 mg total) by mouth 2 (two) times daily. 04/05/18   Evalee Jefferson, PA-C  silver sulfADIAZINE (SILVADENE) 1 % cream Use to areas 2-3 times per day Patient not taking: Reported on 04/05/2018 03/21/18   Florian Buff, MD    Family History Family History  Problem Relation Age of Onset  . Cancer Mother 7       breast  . Breast cancer Mother   . Breast cancer Maternal Aunt   . Breast cancer Maternal Grandmother   . Cancer Maternal Aunt     Social History Social History   Tobacco Use  . Smoking status: Current Some Day Smoker    Packs/day: 0.50    Years: 13.00    Pack years: 6.50    Types: Cigarettes  . Smokeless tobacco: Never Used  Substance Use Topics  . Alcohol use: No    Alcohol/week: 0.0 standard drinks  . Drug use: No     Allergies   Latex   Review of Systems Review of Systems  Constitutional: Negative for chills and fever.  HENT: Negative for congestion and sore throat.   Eyes: Negative.   Respiratory: Negative for chest tightness and shortness of breath.   Cardiovascular: Negative for  chest pain.  Gastrointestinal: Negative for abdominal pain, constipation, diarrhea, nausea and vomiting.  Genitourinary: Positive for frequency and urgency. Negative for dysuria, hematuria and vaginal discharge.  Musculoskeletal: Negative for arthralgias, joint swelling and neck pain.  Skin: Negative.  Negative for rash and wound.  Neurological: Negative for dizziness, weakness, light-headedness, numbness and headaches.  Psychiatric/Behavioral: Negative.      Physical Exam Updated Vital Signs BP 100/79   Pulse (!) 113   Temp 97.9 F (36.6 C) (Temporal)   Resp 16   Ht 5'  6" (1.676 m)   Wt 108.9 kg   LMP 03/14/2018 (Approximate) Comment: takes Megace  SpO2 97%   BMI 38.74 kg/m   Physical Exam Vitals signs and nursing note reviewed.  Constitutional:      Appearance: She is well-developed.  HENT:     Head: Normocephalic and atraumatic.     Mouth/Throat:     Mouth: Mucous membranes are dry.     Pharynx: No oropharyngeal exudate.  Eyes:     Conjunctiva/sclera: Conjunctivae normal.  Neck:     Musculoskeletal: Normal range of motion.  Cardiovascular:     Rate and Rhythm: Normal rate and regular rhythm.     Heart sounds: Normal heart sounds.  Pulmonary:     Effort: Pulmonary effort is normal.     Breath sounds: Normal breath sounds. No wheezing.  Abdominal:     General: Bowel sounds are normal.     Palpations: Abdomen is soft.     Tenderness: There is no abdominal tenderness.  Musculoskeletal: Normal range of motion.  Skin:    General: Skin is warm and dry.     Findings: Rash present.     Comments: Small scabbed lesions bilateral forearms.  Neurological:     Mental Status: She is alert.      ED Treatments / Results  Labs (all labs ordered are listed, but only abnormal results are displayed) Labs Reviewed  URINALYSIS, ROUTINE W REFLEX MICROSCOPIC - Abnormal; Notable for the following components:      Result Value   APPearance HAZY (*)    Glucose, UA >=500 (*)     Hgb urine dipstick MODERATE (*)    Protein, ur 100 (*)    Bacteria, UA RARE (*)    All other components within normal limits  CBC WITH DIFFERENTIAL/PLATELET - Abnormal; Notable for the following components:   WBC 14.7 (*)    Hemoglobin 11.6 (*)    HCT 35.0 (*)    Neutro Abs 12.7 (*)    Abs Immature Granulocytes 0.10 (*)    All other components within normal limits  COMPREHENSIVE METABOLIC PANEL - Abnormal; Notable for the following components:   Sodium 126 (*)    Chloride 90 (*)    Glucose, Bld 702 (*)    Creatinine, Ser 1.02 (*)    Calcium 8.8 (*)    Albumin 2.5 (*)    AST 9 (*)    Total Bilirubin 0.1 (*)    All other components within normal limits  CBG MONITORING, ED - Abnormal; Notable for the following components:   Glucose-Capillary >600 (*)    All other components within normal limits  CBG MONITORING, ED - Abnormal; Notable for the following components:   Glucose-Capillary 519 (*)    All other components within normal limits  PREGNANCY, URINE  LIPASE, BLOOD    EKG None  Radiology Ct Renal Stone Study  Result Date: 04/05/2018 CLINICAL DATA:  Acute right flank pain.  Dysuria. EXAM: CT ABDOMEN AND PELVIS WITHOUT CONTRAST TECHNIQUE: Multidetector CT imaging of the abdomen and pelvis was performed following the standard protocol without IV contrast. COMPARISON:  CT scan of December 01, 2016. FINDINGS: Lower chest: No acute abnormality. Hepatobiliary: Minimal cholelithiasis is noted. No biliary dilatation is noted. Liver is unremarkable on these unenhanced images. Pancreas: Unremarkable. No pancreatic ductal dilatation or surrounding inflammatory changes. Spleen: Normal in size without focal abnormality. Adrenals/Urinary Tract: Stable right adrenal adenoma. Left adrenal gland appears normal. No hydronephrosis or renal obstruction is noted. No renal or  ureteral calculi are noted. Urinary bladder is unremarkable. Stomach/Bowel: Stomach is within normal limits. Appendix appears  normal. No evidence of bowel wall thickening, distention, or inflammatory changes. Vascular/Lymphatic: No significant vascular findings are present. No enlarged abdominal or pelvic lymph nodes. Reproductive: Uterus and bilateral adnexa are unremarkable. Other: Status post hernia repair. No abnormal fluid collection is noted. Musculoskeletal: No acute or significant osseous findings. IMPRESSION: Probable minimal cholelithiasis. Stable right adrenal adenoma. No acute abnormality seen in the abdomen or pelvis. Electronically Signed   By: Marijo Conception, M.D.   On: 04/05/2018 19:55    Procedures Procedures (including critical care time)  Medications Ordered in ED Medications  sodium chloride 0.9 % bolus 1,000 mL (0 mLs Intravenous Stopped 04/05/18 2027)  ondansetron (ZOFRAN) injection 4 mg (4 mg Intravenous Given 04/05/18 1856)  sodium chloride 0.9 % bolus 1,000 mL (0 mLs Intravenous Stopped 04/05/18 2100)  insulin aspart (novoLOG) injection 10 Units (10 Units Subcutaneous Given 04/05/18 2026)     Initial Impression / Assessment and Plan / ED Course  I have reviewed the triage vital signs and the nursing notes.  Pertinent labs & imaging results that were available during my care of the patient were reviewed by me and considered in my medical decision making (see chart for details).        Labs and imaging reviewed and discussed with patient.  She is significantly hyperglycemic without being ketotic.  Her corrected sodium is 136.  She was given 2 L of IV fluids along with subcu fast acting insulin 10 units.  Her CBG was rechecked an hour after this insulin was given and was 519 so is improving.  At this time, patient decided that she needed to be home for her children as she has an adult Down syndrome child who is with his siblings, but no one is answering the phone and she is concerned about the situation.  She refuses to stay for further treatment of her hyperglycemia.  Discussed potential risks of  bleeding and patient was made aware that her treatment was not completed at this time.  She was encouraged to return if she changes her mind about finishing her treatment, also discussed return precautions.  She was advised follow-up with her PCP this week for recheck of her blood sugars.  She was given a refill of her Metformin.  CT was negative for source of her flank pain.  She was pain-free at the time of discharge.  Final Clinical Impressions(s) / ED Diagnoses   Final diagnoses:  Flank pain  Hyperglycemia    ED Discharge Orders         Ordered    metFORMIN (GLUCOPHAGE) 1000 MG tablet  2 times daily     04/05/18 2120           Evalee Jefferson, PA-C 04/06/18 0029    Francine Graven, DO 04/08/18 1554

## 2018-04-12 ENCOUNTER — Telehealth: Payer: Self-pay | Admitting: *Deleted

## 2018-04-12 NOTE — Telephone Encounter (Signed)
Patient states she is out of the cream as she is using it as directed but is out and the pharmacy will not refill for another 15 days.  Informed a dab should be good, but patient stated she has a lot of areas to cover.  Please advise.

## 2018-04-12 NOTE — Telephone Encounter (Signed)
Patient called stating she seen Dr Elonda Husky last week and she called her in a cream and it only last a week, patient states the cream really works and she wants to know if he can call her in a cream every week Please advise [phone]

## 2018-04-12 NOTE — Telephone Encounter (Signed)
Please call pharmacy ok to refill

## 2018-04-12 NOTE — Telephone Encounter (Signed)
Spoke to pharmacy who states she is 2 days early on having Sivadene refilled. Patient informed she can pay out of pocket for medication or wait the 2 days. Verbalized understanding.

## 2018-04-13 ENCOUNTER — Telehealth: Payer: Self-pay | Admitting: *Deleted

## 2018-04-13 NOTE — Telephone Encounter (Signed)
Called patient and left message informing her that we are not allowing any visitors or children to come to visit with her at this time. Also requested that if she has had any exposure to anyone suspected or confirmed of having COVID-19 to call to reschedule appointment. Also requested that if she is experiencing any of the following to call and reschedule : fever, cough, sob, muscle pain, diarrhea, rash, vomiting, abdominal pain, red eye, weakness, bruising or bleeding, joint pain, or a severe headache.

## 2018-04-17 ENCOUNTER — Ambulatory Visit: Payer: Medicaid Other | Admitting: Obstetrics & Gynecology

## 2018-04-30 ENCOUNTER — Encounter: Payer: Self-pay | Admitting: Obstetrics & Gynecology

## 2018-04-30 ENCOUNTER — Encounter: Payer: Self-pay | Admitting: Adult Health

## 2018-05-17 ENCOUNTER — Emergency Department (HOSPITAL_COMMUNITY)
Admission: EM | Admit: 2018-05-17 | Discharge: 2018-05-17 | Disposition: A | Discharge status: Home / Self Care | Payer: Medicaid Other | Attending: Emergency Medicine | Admitting: Emergency Medicine

## 2018-05-17 DIAGNOSIS — Z5321 Procedure and treatment not carried out due to patient leaving prior to being seen by health care provider: Secondary | ICD-10-CM

## 2018-05-17 NOTE — ED Provider Notes (Signed)
It appears that patient left prior to being roomed.    Deklin Bieler, Corene Cornea, MD 05/17/18 [phone]

## 2018-05-17 NOTE — ED Notes (Signed)
Pt not in lobby.  

## 2018-06-06 ENCOUNTER — Other Ambulatory Visit (HOSPITAL_COMMUNITY): Payer: Self-pay | Admitting: Adult Health

## 2018-06-06 DIAGNOSIS — Z1231 Encounter for screening mammogram for malignant neoplasm of breast: Secondary | ICD-10-CM

## 2018-08-10 ENCOUNTER — Other Ambulatory Visit: Payer: Self-pay | Admitting: Obstetrics & Gynecology

## 2018-09-15 ENCOUNTER — Telehealth: Payer: Self-pay | Admitting: *Deleted

## 2018-09-15 ENCOUNTER — Other Ambulatory Visit: Payer: Self-pay | Admitting: Obstetrics & Gynecology

## 2018-09-15 MED ORDER — LANTUS SOLOSTAR 100 UNIT/ML ~~LOC~~ SOPN: 80.0000 [IU] | PEN_INJECTOR | Freq: Every day | SUBCUTANEOUS | 11 refills | Status: AC

## 2018-09-15 MED ORDER — INSULIN ASPART 100 UNIT/ML ~~LOC~~ SOLN: 30.0000 [IU] | Freq: Three times a day (TID) | SUBCUTANEOUS | 11 refills | Status: DC

## 2018-09-15 NOTE — Telephone Encounter (Signed)
I increased her lantus to 80 And her novolog to 30 with each meal

## 2018-09-15 NOTE — Telephone Encounter (Signed)
Patient informed Lantus was increased to 80 units and Novolog to 30 units with each meal.  Verbalized understanding.

## 2018-09-15 NOTE — Telephone Encounter (Signed)
Patient states she is out of Novolog and is needing a new prescription send in to her pharmacy for Novolog 30units BID. She is also wondering if her Lantus should be increased as her blood sugar this morning was 422. Please advise.

## 2018-09-20 ENCOUNTER — Telehealth: Payer: Self-pay | Admitting: *Deleted

## 2018-09-20 NOTE — Telephone Encounter (Signed)
Pharmacy called and stated that Dr. Elonda Husky sent in rx for patients Lantus 80 units once a day. Patient had previously been on 70 units twice a day. Patient requesting that rx be sent in for 80 units bid. Advised that Dr. Elonda Husky not in office and his note states once daily but I would send message to him to see if he can adjust it.

## 2018-09-25 ENCOUNTER — Telehealth: Payer: Self-pay | Admitting: Obstetrics & Gynecology

## 2018-09-25 NOTE — Telephone Encounter (Signed)
Called patient regarding appointment and the following message was left:   We have you scheduled for an upcoming appointment at our office. At this time, patients are encouraged to come alone to their visits whenever possible, however, a support person, over age 40, may accompany you to your appointment if assistance is needed for safety or care concerns. Otherwise, support persons should remain outside until the visit is complete.   We ask if you have had any exposure to anyone suspected or confirmed of having COVID-19 or if you are experiencing any of the following, to call and reschedule your appointment: fever, cough, shortness of breath, muscle pain, diarrhea, rash, vomiting, abdominal pain, red eye, weakness, bruising, bleeding, joint pain, or a severe headache.   Please know we will ask you these questions or similar questions when you arrive for your appointment and again its how we are keeping everyone safe.    Also,to keep you safe, please use the provided hand sanitizer when you enter the office. We are asking everyone in the office to wear a mask to help prevent the spread of germs. If you have a mask of your own, please wear it to your appointment, if not, we are happy to provide one for you.  Thank you for understanding and your cooperation.    [facility] Staff

## 2018-09-26 ENCOUNTER — Ambulatory Visit: Payer: Medicaid Other | Admitting: Obstetrics & Gynecology

## 2018-10-04 ENCOUNTER — Telehealth: Payer: Self-pay | Admitting: Obstetrics & Gynecology

## 2018-10-04 NOTE — Telephone Encounter (Signed)
Called patient regarding appointment and the following message was left:   We have you scheduled for an upcoming appointment at our office. At this time, patients are encouraged to come alone to their visits whenever possible, however, a support person, over age 41, may accompany you to your appointment if assistance is needed for safety or care concerns. Otherwise, support persons should remain outside until the visit is complete.   We ask if you have had any exposure to anyone suspected or confirmed of having COVID-19 or if you are experiencing any of the following, to call and reschedule your appointment: fever, cough, shortness of breath, muscle pain, diarrhea, rash, vomiting, abdominal pain, red eye, weakness, bruising, bleeding, joint pain, or a severe headache.   Please know we will ask you these questions or similar questions when you arrive for your appointment and again its how we are keeping everyone safe.    Also,to keep you safe, please use the provided hand sanitizer when you enter the office. We are asking everyone in the office to wear a mask to help prevent the spread of germs. If you have a mask of your own, please wear it to your appointment, if not, we are happy to provide one for you.  Thank you for understanding and your cooperation.    [facility] Staff

## 2018-10-04 NOTE — Telephone Encounter (Signed)
Correction, patient's mailbox was full/hhs

## 2018-10-05 ENCOUNTER — Ambulatory Visit: Payer: Medicaid Other | Admitting: Obstetrics & Gynecology

## 2018-10-16 ENCOUNTER — Telehealth: Payer: Self-pay | Admitting: *Deleted

## 2018-10-16 NOTE — Telephone Encounter (Signed)
Pt requesting refill on Lyrica. 

## 2018-10-17 NOTE — Telephone Encounter (Signed)
Per Anderson Malta pt needs to get this from her PCP, we do not prescribe it for her. Called patient back and heard message that mailbox is full.

## 2018-11-03 ENCOUNTER — Other Ambulatory Visit: Payer: Self-pay

## 2018-11-03 ENCOUNTER — Ambulatory Visit (INDEPENDENT_AMBULATORY_CARE_PROVIDER_SITE_OTHER): Payer: Medicaid Other | Admitting: Internal Medicine

## 2018-11-03 ENCOUNTER — Encounter: Payer: Self-pay | Admitting: Internal Medicine

## 2018-11-03 VITALS — BP 160/90 | HR 100 | Ht 66.0 in | Wt 237.4 lb

## 2018-11-03 DIAGNOSIS — E1165 Type 2 diabetes mellitus with hyperglycemia: Secondary | ICD-10-CM | POA: Diagnosis not present

## 2018-11-03 DIAGNOSIS — E1142 Type 2 diabetes mellitus with diabetic polyneuropathy: Secondary | ICD-10-CM | POA: Diagnosis not present

## 2018-11-03 DIAGNOSIS — IMO0002 Reserved for concepts with insufficient information to code with codable children: Secondary | ICD-10-CM

## 2018-11-03 MED ORDER — METFORMIN HCL 500 MG PO TABS: 1000.0000 mg | ORAL_TABLET | Freq: Two times a day (BID) | ORAL | 3 refills | Status: DC

## 2018-11-03 MED ORDER — HUMULIN R U-500 (CONCENTRATED) 500 UNIT/ML ~~LOC~~ SOLN: 90.0000 [IU] | Freq: Two times a day (BID) | SUBCUTANEOUS | 3 refills | Status: DC

## 2018-11-03 MED ORDER — HUMULIN R U-500 KWIKPEN 500 UNIT/ML ~~LOC~~ SOPN: 90.0000 [IU] | PEN_INJECTOR | Freq: Two times a day (BID) | SUBCUTANEOUS | 5 refills | Status: DC

## 2018-11-03 MED ORDER — BD INSULIN SYRINGE U-500 31G X 6MM 0.5 ML MISC: 190.0000 [IU] | Freq: Two times a day (BID) | 3 refills | Status: DC

## 2018-11-03 MED ORDER — INSULIN PEN NEEDLE 32G X 4 MM MISC: 3 refills | Status: DC

## 2018-11-03 NOTE — Progress Notes (Addendum)
Patient ID: Felicia Reynolds, female   DOB: 11/08/1980, 38 y.o.   MRN: [id number]   HPI: Felicia Reynolds is a 38 y.o.-year-old female, referred by Docia Barrier, PA, for management of DM38, dx at 38 years old, insulin-dependent since 1997, very uncontrolled, with complications (DR, PN).  Patient has been previously seen by Dr. Jillyn Ledger in the past.  She has a significant history of noncompliance with medications and appointments.  She also has a history of repeated ED visits for hyperglycemia or related complications.  Per review of the chart, in 04/2018 glucose was 702 on BMP.  Reviewed latest HbA1c levels: 07/2018: HbA1c 14% Lab Results  Component Value Date   HGBA1C 13.4 (H) 11/30/2016   HGBA1C 13.6 (H) 08/20/2016   HGBA1C 14 03/12/2016   HGBA1C 12.5 (H) 05/29/2015   Pt is on a regimen of: - Metformin 1000 mg 2x a day, with meals >> ran out - Lantus 70-80 units 2x a day - every mo she runs out for ~1 week as she is not given this from the pharmacy - Novolog 30-60 units 3x a day, before meals She does not get enough needles from the pharmacy >> she reuses needles. She cannot use vials because she cannot see to draw the medication in the syringe!  Even for her pens, she has a nurse coming to her house and setting the doses out for her.   Pt checks her sugars ~5x a day and they are: - am: 212-500, 800 off insulin  - 2h after b'fast: n/c - before lunch: 120-200 - 2h after lunch: n/c - before dinner: 230-320 - 2h after dinner: n/c - bedtime: 400-500 - nighttime: n/c Lowest sugar was 120; she has hypoglycemia awareness at 200. Highest sugar was HI.  Glucometer: Accu-Chek Aviva Plus  Pt's meals are: - Breakfast (10 am): oatmeal + fruit (berries) or grits - Lunch (2 pm): 2 fried eggs (just the york) + tomatoes - Dinner (8 pm): baked chicken or shrimp + collard greens or green beans + vinegar - Snacks: no Of note, she is on Megace for uterine bleeding.  -No history of CKD,  last BUN/creatinine:  Lab Results  Component Value Date   BUN 10 04/05/2018   BUN 18 01/29/2018   CREATININE 1.02 (H) 04/05/2018   CREATININE 0.98 01/29/2018  On lisinopril 20 mg daily.  -+ HL; last set of lipids: Lab Results  Component Value Date   CHOL 166 05/29/2015   HDL 32 (L) 05/29/2015   LDLCALC 102 (H) 05/29/2015   TRIG 162 (H) 05/29/2015   CHOLHDL 5.2 (H) 05/29/2015  She is not on a statin.  - last eye exam was in 2019: +DR. Had cataract sx.  - + numbness and tingling in her feet.  She saw podiatry in the past.  Pt has FH of DM in M, F, GM, GF, daughterype 1 DM, aunt.  She has mm aches >> was on Flexeril, now off. Given Tizanidine >> does not work.   ROS: Constitutional: no weight gain, no weight loss, no fatigue, no subjective hyperthermia, no subjective hypothermia, no nocturia Eyes: + Blurry vision, no xerophthalmia ENT: no sore throat, no nodules palpated in neck, no dysphagia, no odynophagia, no hoarseness, no tinnitus, no hypoacusis Cardiovascular: no CP, no SOB, no palpitations, no leg swelling Respiratory: no cough, no SOB, no wheezing Gastrointestinal: no N, no V, no D, no C, no acid reflux Musculoskeletal: + muscle, no joint aches Skin: no rash, no hair loss  Neurological: no tremors, + numbness and tingling/no dizziness/no HAs Psychiatric: no depression, + anxiety  Past Medical History:  Diagnosis Date  . Anxiety   . Arthritis   . Bipolar disorder ([place])   . Diabetes ([place]) 06/05/2015  . Diabetes mellitus   . Fibromyalgia   . History of abnormal cervical Pap smear 05/29/2015  . History of diabetes mellitus, type II 05/29/2015  . Hypertension   . Neuropathy   . Obesity 05/29/2015  . Osteoporosis   . Ovarian cyst 02/20/2016  . Ovarian cyst 02/20/2016   Simple cyst rt ovary  . Panic attacks   . Pneumonia   . Stroke ([place])   . Superficial fungus infection of skin 05/29/2015  . Vitamin D deficiency 06/05/2015   Past Surgical History:  Procedure  Laterality Date  . CESAREAN SECTION    . HERNIA REPAIR    . THROAT SURGERY     Social History   Socioeconomic History  . Marital status: Divorced    Spouse name: Not on file  . Number of children: Not on file  . Years of education: Not on file  . Highest education level: Not on file  Occupational History  . Not on file  Social Needs  . Financial resource strain: Not on file  . Food insecurity    Worry: Not on file    Inability: Not on file  . Transportation needs    Medical: Not on file    Non-medical: Not on file  Tobacco Use  . Smoking status: Current Some Day Smoker    Packs/day: 0.50    Years: 13.00    Pack years: 6.50    Types: Cigarettes  . Smokeless tobacco: Never Used  Substance and Sexual Activity  . Alcohol use: No    Alcohol/week: 0.0 standard drinks  . Drug use: No  . Sexual activity: Yes    Birth control/protection: None  Lifestyle  . Physical activity    Days per week: Not on file    Minutes per session: Not on file  . Stress: Not on file  Relationships  . Social Herbalist on phone: Not on file    Gets together: Not on file    Attends religious service: Not on file    Active member of club or organization: Not on file    Attends meetings of clubs or organizations: Not on file    Relationship status: Not on file  . Intimate partner violence    Fear of current or ex partner: Not on file    Emotionally abused: Not on file    Physically abused: Not on file    Forced sexual activity: Not on file  Other Topics Concern  . Not on file  Social History Narrative  . Not on file   Current Outpatient Medications on File Prior to Visit  Medication Sig Dispense Refill  . bismuth subsalicylate (PEPTO BISMOL) 262 MG/15ML suspension Take 30 mLs by mouth daily as needed for indigestion.    . cloNIDine (CATAPRES) 0.2 MG tablet Take 0.2 mg by mouth 2 (two) times daily.    . diclofenac sodium (VOLTAREN) 1 % GEL Apply 2 g topically 4 (four) times daily  as needed (pain).     . fluconazole (DIFLUCAN) 100 MG tablet Take 1 tablet (100 mg total) by mouth daily. 14 tablet 0  . insulin aspart (NOVOLOG) 100 UNIT/ML injection Inject 30 Units into the skin 3 (three) times daily with meals. 10 mL 11  .  Insulin Glargine (LANTUS SOLOSTAR) 100 UNIT/ML Solostar Pen Inject 80 Units into the skin at bedtime. (Patient taking differently: Inject 70-80 Units into the skin 2 (two) times daily. Inject 70-80 units under the skin twice daily.) 15 mL 11  . lisinopril (PRINIVIL,ZESTRIL) 20 MG tablet Take 20 mg by mouth daily.     . megestrol (MEGACE) 40 MG tablet Take 1 tablet (40 mg total) by mouth 3 (three) times daily as needed (regulate period). 90 tablet 1  . pregabalin (LYRICA) 200 MG capsule Take 200 mg by mouth 3 (three) times daily.     . silver sulfADIAZINE (SILVADENE) 1 % cream Use to areas 2-3 times per day 50 g 11  . tiZANidine (ZANAFLEX) 4 MG capsule Take 4 mg by mouth 3 (three) times daily.     No current facility-administered medications on file prior to visit.    Allergies  Allergen Reactions  . Latex Itching and Rash   Family History  Problem Relation Age of Onset  . Cancer Mother 23       breast  . Breast cancer Mother   . Breast cancer Maternal Aunt   . Breast cancer Maternal Grandmother   . Cancer Maternal Aunt     PE: BP (!) 160/90 (BP Location: Left Arm, Patient Position: Sitting, Cuff Size: Normal)   Pulse 100   Ht 5\' 6"  (1.676 m)   Wt 237 lb 6.4 oz (107.7 kg)   SpO2 97%   BMI 38.32 kg/m  Wt Readings from Last 3 Encounters:  11/03/18 237 lb 6.4 oz (107.7 kg)  04/05/18 240 lb (108.9 kg)  03/21/18 247 lb (112 kg)   Constitutional: overweight, in NAD Eyes: PERRLA, EOMI, no exophthalmos ENT: moist mucous membranes, no thyromegaly, no cervical lymphadenopathy Cardiovascular: RRR, No MRG Respiratory: CTA B Gastrointestinal: abdomen soft, NT, ND, BS+ Musculoskeletal: no deformities, strength intact in all 4 Skin: moist, warm,  no rashes Neurological: no tremor with outstretched hands, DTR normal in all 4  ASSESSMENT: 1. DM2, insulin-dependent, uncontrolled, without long-term complications, but with hyperglycemia - DR - PN - History of noncompliance with treatment - h/o vulvovaginal candidiasis  PLAN:  1. Patient with long-standing, uncontrolled diabetes, on  basal-bolus insulin regimen, which became insufficient.  Her sugars remain extremely high and her most recent HbA1c was very high, at 14%.   -She was previously on Metformin, which she was tolerating well, but this was reportedly not sent to her pharmacy by the previous treating provider.  We will restart this.  -She is using a large amount of insulin which she reportedly does not miss.  Unfortunately, she cannot use vials of insulin due to not seeing how to draw the medication in the syringe.  These would have been better options for her as she would have been able to mix them (for example a regimen containing N and R insulin).  Moreover, she would be a good candidate for U500 insulin, but Medicaid does not cover pens, only vials.  We did discuss to try to send U500 insulin pens to her pharmacy to see if Medicaid can make an exception since she cannot use vials.  If these are not covered, we will need to work with Lantus and NovoLog.  In that case, I would suggest to increase Lantus to 3 injections a day.  She will also probably need more NovoLog.  We can also try to add a GLP-1 receptor agonist.  They are once approved by Medicaid are Victoza, which is a daily  medication and she already takes several injections a day, and Bydureon, which is a weekly medication. -She has several healed sores on her feet and 1 cut under her large toe.  I suggested to schedule an appointment with podiatry.  Given address and telephone number. - I suggested to:  Patient Instructions  Please schedule an appt with a podiatrist: [place]) Address: [address], [place], [place] [address] [address] Phone: [phone] Appointments: [email or web address]  Please start Metformin 500 mg with dinner x 4 days. If you tolerate this well, add another Metformin tablet (500 mg) with breakfast x 4 days. If you tolerate this well, add another metformin tablet with dinner (total 1000 mg) x 4 days. If you tolerate this well, add another metformin tablet with breakfast (total 1000 mg). Continue with 1000 mg of metformin 2x a day with breakfast and dinner.  Please stop Lantus and Novolog and start: - Humulin U500 insulin 90 units 2x a day: before brunch and before dinner  Please return in 1.5 months with your sugar log.   - Strongly advised her to start checking sugars at different times of the day - check 4x a day, rotating checks - discussed about CBG targets for treatment: 80-130 mg/dL before meals and <180 mg/dL after meals; target HbA1c <7%. - given sugar log and advised how to fill it and to bring it at next appt  - given foot care handout and explained the principles  - given instructions for hypoglycemia management "15-15 rule"  - advised for yearly eye exams  - Return to clinic in 1.5 mo with sugar log   Philemon Kingdom, MD PhD [facility] Endocrinology

## 2018-11-03 NOTE — Patient Instructions (Addendum)
Please schedule an appt with a podiatrist: [place] [facility]) Address: [address], [place],  [address] [address] [place] Phone: [phone] Appointments: [email or web address]  Please start Metformin 500 mg with dinner x 4 days. If you tolerate this well, add another Metformin tablet (500 mg) with breakfast x 4 days. If you tolerate this well, add another metformin tablet with dinner (total 1000 mg) x 4 days. If you tolerate this well, add another metformin tablet with breakfast (total 1000 mg). Continue with 1000 mg of metformin 2x a day with breakfast and dinner.  Please stop Lantus and Novolog and start: - Humulin U500 insulin 90 units 2x a day: before brunch and before dinner  Please return in 1.5 months with your sugar log.   PATIENT INSTRUCTIONS FOR TYPE 2 DIABETES:  **Please join MyChart!** - see attached instructions about how to join if you have not done so already.  DIET AND EXERCISE Diet and exercise is an important part of diabetic treatment.  We recommended aerobic exercise in the form of brisk walking (working between 40-60% of maximal aerobic capacity, similar to brisk walking) for 150 minutes per week (such as 30 minutes five days per week) along with 3 times per week performing 'resistance' training (using various gauge rubber tubes with handles) 5-10 exercises involving the major muscle groups (upper body, lower body and core) performing 10-15 repetitions (or near fatigue) each exercise. Start at half the above goal but build slowly to reach the above goals. If limited by weight, joint pain, or disability, we recommend daily walking in a swimming pool with water up to waist to reduce pressure from joints while allow for adequate exercise.    BLOOD GLUCOSES Monitoring your blood glucoses is important for continued management of your diabetes. Please check your blood glucoses 2-4 times a day: fasting, before meals and at bedtime (you can rotate these measurements  - e.g. one day check before the 3 meals, the next day check before 2 of the meals and before bedtime, etc.).   HYPOGLYCEMIA (low blood sugar) Hypoglycemia is usually a reaction to not eating, exercising, or taking too much insulin/ other diabetes drugs.  Symptoms include tremors, sweating, hunger, confusion, headache, etc. Treat IMMEDIATELY with 15 grams of Carbs: . 4 glucose tablets .  cup regular juice/soda . 2 tablespoons raisins . 4 teaspoons sugar . 1 tablespoon honey Recheck blood glucose in 15 mins and repeat above if still symptomatic/blood glucose <100.  RECOMMENDATIONS TO REDUCE YOUR RISK OF DIABETIC COMPLICATIONS: * Take your prescribed MEDICATION(S) * Follow a DIABETIC diet: Complex carbs, fiber rich foods, (monounsaturated and polyunsaturated) fats * AVOID saturated/trans fats, high fat foods, >2,300 mg salt per day. * EXERCISE at least 5 times a week for 30 minutes or preferably daily.  * DO NOT SMOKE OR DRINK more than 1 drink a day. * Check your FEET every day. Do not wear tightfitting shoes. Contact us if you develop an ulcer * See your EYE doctor once a year or more if needed * Get a FLU shot once a year * Get a PNEUMONIA vaccine once before and once after age 27 years  GOALS:  * Your Hemoglobin A1c of <7%  * fasting sugars need to be <130 * after meals sugars need to be <180 (2h after you start eating) * Your Systolic BP should be [id number] or lower  * Your Diastolic BP should be 80 or lower  * Your HDL (Good Cholesterol) should be 40 or  higher  * Your LDL (Bad Cholesterol) should be 100 or lower. * Your Triglycerides should be 150 or lower  * Your Urine microalbumin (kidney function) should be <30 * Your Body Mass Index should be 25 or lower    Please consider the following ways to cut down carbs and fat and increase fiber and micronutrients in your diet: - substitute whole grain for white bread or pasta - substitute brown rice for white rice - substitute  90-calorie flat bread pieces for slices of bread when possible - substitute sweet potatoes or yams for white potatoes - substitute humus for margarine - substitute tofu for cheese when possible - substitute almond or rice milk for regular milk (would not drink soy milk daily due to concern for soy estrogen influence on breast cancer risk) - substitute dark chocolate for other sweets when possible - substitute water - can add lemon or orange slices for taste - for diet sodas (artificial sweeteners will trick your body that you can eat sweets without getting calories and will lead you to overeating and weight gain in the long run) - do not skip breakfast or other meals (this will slow down the metabolism and will result in more weight gain over time)  - can try smoothies made from fruit and almond/rice milk in am instead of regular breakfast - can also try old-fashioned (not instant) oatmeal made with almond/rice milk in am - order the dressing on the side when eating salad at a restaurant (pour less than half of the dressing on the salad) - eat as little meat as possible - can try juicing, but should not forget that juicing will get rid of the fiber, so would alternate with eating raw veg./fruits or drinking smoothies - use as little oil as possible, even when using olive oil - can dress a salad with a mix of balsamic vinegar and lemon juice, for e.g. - use agave nectar, stevia sugar, or regular sugar rather than artificial sweateners - steam or broil/roast veggies  - snack on veggies/fruit/nuts (unsalted, preferably) when possible, rather than processed foods - reduce or eliminate aspartame in diet (it is in diet sodas, chewing gum, etc) Read the labels!  Try to read Dr. Janene Harvey book: "Program for Reversing Diabetes" for other ideas for healthy eating.

## 2018-11-06 ENCOUNTER — Telehealth: Payer: Self-pay

## 2018-11-06 NOTE — Telephone Encounter (Signed)
LM for patient to return call to make sure she was able to pick up the U500 insulin pens

## 2018-11-10 NOTE — Telephone Encounter (Signed)
Left message for patient to return our call at [phone].  

## 2018-11-24 ENCOUNTER — Emergency Department (HOSPITAL_COMMUNITY): Payer: Medicaid Other

## 2018-11-24 ENCOUNTER — Other Ambulatory Visit: Payer: Self-pay

## 2018-11-24 ENCOUNTER — Encounter (HOSPITAL_COMMUNITY): Payer: Self-pay

## 2018-11-24 ENCOUNTER — Inpatient Hospital Stay (HOSPITAL_COMMUNITY)
Admission: EM | Admit: 2018-11-24 | Discharge: 2018-11-25 | DRG: 194 | Discharge status: Against Medical Advice | Payer: Medicaid Other | Attending: Internal Medicine | Admitting: Internal Medicine

## 2018-11-24 DIAGNOSIS — M1990 Unspecified osteoarthritis, unspecified site: Secondary | ICD-10-CM | POA: Diagnosis present

## 2018-11-24 DIAGNOSIS — Z803 Family history of malignant neoplasm of breast: Secondary | ICD-10-CM | POA: Diagnosis not present

## 2018-11-24 DIAGNOSIS — F319 Bipolar disorder, unspecified: Secondary | ICD-10-CM | POA: Diagnosis present

## 2018-11-24 DIAGNOSIS — R1011 Right upper quadrant pain: Secondary | ICD-10-CM | POA: Diagnosis not present

## 2018-11-24 DIAGNOSIS — Z6841 Body Mass Index (BMI) 40.0 and over, adult: Secondary | ICD-10-CM | POA: Diagnosis not present

## 2018-11-24 DIAGNOSIS — R05 Cough: Secondary | ICD-10-CM

## 2018-11-24 DIAGNOSIS — J181 Lobar pneumonia, unspecified organism: Secondary | ICD-10-CM | POA: Diagnosis present

## 2018-11-24 DIAGNOSIS — M797 Fibromyalgia: Secondary | ICD-10-CM | POA: Diagnosis present

## 2018-11-24 DIAGNOSIS — Z91040 Latex allergy status: Secondary | ICD-10-CM | POA: Diagnosis not present

## 2018-11-24 DIAGNOSIS — R059 Cough, unspecified: Secondary | ICD-10-CM

## 2018-11-24 DIAGNOSIS — E1165 Type 2 diabetes mellitus with hyperglycemia: Secondary | ICD-10-CM | POA: Diagnosis present

## 2018-11-24 DIAGNOSIS — G894 Chronic pain syndrome: Secondary | ICD-10-CM | POA: Diagnosis present

## 2018-11-24 DIAGNOSIS — D72829 Elevated white blood cell count, unspecified: Secondary | ICD-10-CM | POA: Insufficient documentation

## 2018-11-24 DIAGNOSIS — Z794 Long term (current) use of insulin: Secondary | ICD-10-CM

## 2018-11-24 DIAGNOSIS — M810 Age-related osteoporosis without current pathological fracture: Secondary | ICD-10-CM | POA: Diagnosis present

## 2018-11-24 DIAGNOSIS — Z20828 Contact with and (suspected) exposure to other viral communicable diseases: Secondary | ICD-10-CM | POA: Diagnosis present

## 2018-11-24 DIAGNOSIS — Z8673 Personal history of transient ischemic attack (TIA), and cerebral infarction without residual deficits: Secondary | ICD-10-CM

## 2018-11-24 DIAGNOSIS — I10 Essential (primary) hypertension: Secondary | ICD-10-CM | POA: Diagnosis present

## 2018-11-24 DIAGNOSIS — E1142 Type 2 diabetes mellitus with diabetic polyneuropathy: Secondary | ICD-10-CM | POA: Diagnosis present

## 2018-11-24 DIAGNOSIS — IMO0002 Reserved for concepts with insufficient information to code with codable children: Secondary | ICD-10-CM | POA: Diagnosis present

## 2018-11-24 DIAGNOSIS — E559 Vitamin D deficiency, unspecified: Secondary | ICD-10-CM | POA: Diagnosis present

## 2018-11-24 DIAGNOSIS — N179 Acute kidney failure, unspecified: Secondary | ICD-10-CM | POA: Diagnosis present

## 2018-11-24 DIAGNOSIS — F17210 Nicotine dependence, cigarettes, uncomplicated: Secondary | ICD-10-CM | POA: Diagnosis present

## 2018-11-24 LAB — COMPREHENSIVE METABOLIC PANEL
ALT: 13 U/L (ref 0–44)
AST: 15 U/L (ref 15–41)
Albumin: 2.9 g/dL — ABNORMAL LOW (ref 3.5–5.0)
Alkaline Phosphatase: 82 U/L (ref 38–126)
Anion gap: 8 (ref 5–15)
BUN: 24 mg/dL — ABNORMAL HIGH (ref 6–20)
CO2: 28 mmol/L (ref 22–32)
Calcium: 8.6 mg/dL — ABNORMAL LOW (ref 8.9–10.3)
Chloride: 98 mmol/L (ref 98–111)
Creatinine, Ser: 1.39 mg/dL — ABNORMAL HIGH (ref 0.44–1.00)
GFR calc Af Amer: 55 mL/min — ABNORMAL LOW (ref >60)
GFR calc non Af Amer: 47 mL/min — ABNORMAL LOW (ref >60)
Glucose, Bld: 224 mg/dL — ABNORMAL HIGH (ref 70–99)
Potassium: 3.7 mmol/L (ref 3.5–5.1)
Sodium: 134 mmol/L — ABNORMAL LOW (ref 135–145)
Total Bilirubin: 0.5 mg/dL (ref 0.3–1.2)
Total Protein: 7.3 g/dL (ref 6.5–8.1)

## 2018-11-24 LAB — CBC WITH DIFFERENTIAL/PLATELET
Abs Immature Granulocytes: 0.13 10*3/uL — ABNORMAL HIGH (ref 0.00–0.07)
Basophils Absolute: 0.1 10*3/uL (ref 0.0–0.1)
Basophils Relative: 0 %
Eosinophils Absolute: 0 10*3/uL (ref 0.0–0.5)
Eosinophils Relative: 0 %
HCT: 43.2 % (ref 36.0–46.0)
Hemoglobin: 13.5 g/dL (ref 12.0–15.0)
Immature Granulocytes: 0 %
Lymphocytes Relative: 7 %
Lymphs Abs: 2.2 10*3/uL (ref 0.7–4.0)
MCH: 25.3 pg — ABNORMAL LOW (ref 26.0–34.0)
MCHC: 31.3 g/dL (ref 30.0–36.0)
MCV: 81.1 fL (ref 80.0–100.0)
Monocytes Absolute: 1.1 10*3/uL — ABNORMAL HIGH (ref 0.1–1.0)
Monocytes Relative: 4 %
Neutro Abs: 26.6 10*3/uL — ABNORMAL HIGH (ref 1.7–7.7)
Neutrophils Relative %: 89 %
Platelets: 342 10*3/uL (ref 150–400)
RBC: 5.33 MIL/uL — ABNORMAL HIGH (ref 3.87–5.11)
RDW: 18.6 % — ABNORMAL HIGH (ref 11.5–15.5)
WBC: 30.2 10*3/uL — ABNORMAL HIGH (ref 4.0–10.5)
nRBC: 0 % (ref 0.0–0.2)

## 2018-11-24 LAB — URINALYSIS, ROUTINE W REFLEX MICROSCOPIC
Bilirubin Urine: NEGATIVE
Glucose, UA: 150 mg/dL — AB
Ketones, ur: NEGATIVE mg/dL
Leukocytes,Ua: NEGATIVE
Nitrite: NEGATIVE
Protein, ur: >=300 mg/dL — AB
Specific Gravity, Urine: >1.046 — ABNORMAL HIGH (ref 1.005–1.030)
pH: 6 (ref 5.0–8.0)

## 2018-11-24 LAB — D-DIMER, QUANTITATIVE: D-Dimer, Quant: 0.7 ug/mL-FEU — ABNORMAL HIGH (ref 0.00–0.50)

## 2018-11-24 LAB — HIV ANTIBODY (ROUTINE TESTING W REFLEX): HIV Screen 4th Generation wRfx: NONREACTIVE

## 2018-11-24 LAB — I-STAT BETA HCG BLOOD, ED (MC, WL, AP ONLY): I-stat hCG, quantitative: <5 m[IU]/mL (ref <5)

## 2018-11-24 LAB — PROCALCITONIN: Procalcitonin: 0.1 ng/mL

## 2018-11-24 LAB — GLUCOSE, CAPILLARY: Glucose-Capillary: 155 mg/dL — ABNORMAL HIGH (ref 70–99)

## 2018-11-24 LAB — LACTIC ACID, PLASMA: Lactic Acid, Venous: 0.8 mmol/L (ref 0.5–1.9)

## 2018-11-24 LAB — POC SARS CORONAVIRUS 2 AG -  ED: SARS Coronavirus 2 Ag: NEGATIVE

## 2018-11-24 LAB — LIPASE, BLOOD: Lipase: 17 U/L (ref 11–51)

## 2018-11-24 MED ORDER — PREGABALIN 75 MG PO CAPS
200.0000 mg | ORAL_CAPSULE | Freq: Three times a day (TID) | ORAL | Status: DC
  Administered 2018-11-24 – 2018-11-25 (×2): 200 mg via ORAL
  Filled 2018-11-24 (×2): qty 1

## 2018-11-24 MED ORDER — ACETAMINOPHEN 650 MG RE SUPP: 650.0000 mg | Freq: Four times a day (QID) | RECTAL | Status: DC | PRN

## 2018-11-24 MED ORDER — ONDANSETRON HCL 4 MG/2ML IJ SOLN
4.0000 mg | Freq: Once | INTRAMUSCULAR | Status: AC
  Administered 2018-11-24: 4 mg via INTRAVENOUS
  Filled 2018-11-24: qty 2

## 2018-11-24 MED ORDER — FENTANYL CITRATE (PF) 100 MCG/2ML IJ SOLN
50.0000 ug | Freq: Once | INTRAMUSCULAR | Status: AC
  Administered 2018-11-24: 12:00:00 50 ug via INTRAVENOUS
  Filled 2018-11-24: qty 2

## 2018-11-24 MED ORDER — INSULIN ASPART 100 UNIT/ML ~~LOC~~ SOLN: 0.0000 [IU] | Freq: Every day | SUBCUTANEOUS | Status: DC

## 2018-11-24 MED ORDER — ONDANSETRON HCL 4 MG PO TABS: 4.0000 mg | ORAL_TABLET | Freq: Four times a day (QID) | ORAL | Status: DC | PRN

## 2018-11-24 MED ORDER — SODIUM CHLORIDE 0.9 % IV BOLUS (SEPSIS)
1000.0000 mL | Freq: Once | INTRAVENOUS | Status: AC
  Administered 2018-11-24: 1000 mL via INTRAVENOUS

## 2018-11-24 MED ORDER — SODIUM CHLORIDE 0.9 % IV SOLN: 500.0000 mg | INTRAVENOUS | Status: DC

## 2018-11-24 MED ORDER — IOHEXOL 300 MG/ML  SOLN
100.0000 mL | Freq: Once | INTRAMUSCULAR | Status: AC | PRN
  Administered 2018-11-24: 100 mL via INTRAVENOUS

## 2018-11-24 MED ORDER — FENTANYL CITRATE (PF) 100 MCG/2ML IJ SOLN
100.0000 ug | Freq: Once | INTRAMUSCULAR | Status: AC
  Administered 2018-11-24: 100 ug via INTRAVENOUS
  Filled 2018-11-24: qty 2

## 2018-11-24 MED ORDER — ONDANSETRON HCL 4 MG/2ML IJ SOLN: 4.0000 mg | Freq: Four times a day (QID) | INTRAMUSCULAR | Status: DC | PRN

## 2018-11-24 MED ORDER — ENOXAPARIN SODIUM 40 MG/0.4ML ~~LOC~~ SOLN
40.0000 mg | SUBCUTANEOUS | Status: DC
  Administered 2018-11-24: 40 mg via SUBCUTANEOUS
  Filled 2018-11-24: qty 0.4

## 2018-11-24 MED ORDER — POTASSIUM CHLORIDE IN NACL 20-0.9 MEQ/L-% IV SOLN
INTRAVENOUS | Status: DC
  Administered 2018-11-24: 21:00:00 via INTRAVENOUS

## 2018-11-24 MED ORDER — CLONIDINE HCL 0.2 MG PO TABS
0.2000 mg | ORAL_TABLET | Freq: Two times a day (BID) | ORAL | Status: DC
  Administered 2018-11-24 – 2018-11-25 (×2): 0.2 mg via ORAL
  Filled 2018-11-24 (×2): qty 1

## 2018-11-24 MED ORDER — BUPRENORPHINE HCL-NALOXONE HCL 8-2 MG SL SUBL
1.0000 | SUBLINGUAL_TABLET | Freq: Every day | SUBLINGUAL | Status: DC
  Filled 2018-11-24 (×2): qty 1

## 2018-11-24 MED ORDER — ONDANSETRON HCL 4 MG/2ML IJ SOLN
4.0000 mg | Freq: Once | INTRAMUSCULAR | Status: AC
  Administered 2018-11-24: 15:00:00 4 mg via INTRAVENOUS
  Filled 2018-11-24: qty 2

## 2018-11-24 MED ORDER — ENSURE ENLIVE PO LIQD
237.0000 mL | Freq: Two times a day (BID) | ORAL | Status: DC
  Administered 2018-11-25: 10:00:00 237 mL via ORAL

## 2018-11-24 MED ORDER — LEVOFLOXACIN IN D5W 500 MG/100ML IV SOLN
500.0000 mg | Freq: Once | INTRAVENOUS | Status: AC
  Administered 2018-11-24: 15:00:00 500 mg via INTRAVENOUS
  Filled 2018-11-24: qty 100

## 2018-11-24 MED ORDER — TIZANIDINE HCL 4 MG PO TABS
4.0000 mg | ORAL_TABLET | Freq: Three times a day (TID) | ORAL | Status: DC
  Administered 2018-11-24 – 2018-11-25 (×2): 4 mg via ORAL
  Filled 2018-11-24 (×2): qty 1

## 2018-11-24 MED ORDER — INSULIN ASPART 100 UNIT/ML ~~LOC~~ SOLN
0.0000 [IU] | Freq: Three times a day (TID) | SUBCUTANEOUS | Status: DC
  Administered 2018-11-25: 09:00:00 4 [IU] via SUBCUTANEOUS

## 2018-11-24 MED ORDER — SODIUM CHLORIDE 0.9 % IV SOLN
1.0000 g | INTRAVENOUS | Status: DC
  Administered 2018-11-24: 21:00:00 1 g via INTRAVENOUS
  Filled 2018-11-24: qty 10

## 2018-11-24 MED ORDER — ACETAMINOPHEN 325 MG PO TABS
650.0000 mg | ORAL_TABLET | Freq: Four times a day (QID) | ORAL | Status: DC | PRN
  Administered 2018-11-24: 21:00:00 650 mg via ORAL
  Filled 2018-11-24: qty 2

## 2018-11-24 MED ORDER — INSULIN GLARGINE 100 UNIT/ML ~~LOC~~ SOLN
70.0000 [IU] | Freq: Every day | SUBCUTANEOUS | Status: DC
  Administered 2018-11-24: 21:00:00 70 [IU] via SUBCUTANEOUS
  Filled 2018-11-24 (×3): qty 0.7

## 2018-11-24 MED ORDER — FENTANYL CITRATE (PF) 100 MCG/2ML IJ SOLN
50.0000 ug | Freq: Once | INTRAMUSCULAR | Status: AC
  Administered 2018-11-24: 07:00:00 50 ug via INTRAVENOUS
  Filled 2018-11-24: qty 2

## 2018-11-24 NOTE — H&P (Signed)
History and Physical  MCKENSI MARINER [id number] DOB: 1980-12-07 DOA: 11/24/2018   PCP: [facility] [place]   Patient coming from: Home  Chief Complaint: RUQ pain  HPI:  Felicia Reynolds is a 38 y.o. female with medical history of diabetes mellitus type 2, fibromyalgia, hypertension, morbid obesity, anxiety, chronic pain syndrome presenting with 1 day history of right upper quadrant pain that began on 11/22/2018 in the afternoon.  The patient states that she had at least 4 episodes of emesis the last of which she noted some blood.  She stated that abdominal pain was concentrated in the right upper quadrant and was constant and severe.  As result, she presented for further evaluation.  She denies any sick contacts.  She denies any new medications or eating any exotic or undercooked foods.  She states that her family has not been sick.  She described the pain as colicky in nature.  However, she had denied any fevers, chills, chest pain, headache, neck pain, diarrhea, hematochezia, melena, dysuria, hematuria.  She stated that approximately 1 week prior to this admission she went to see her primary care provider.  She was diagnosed with bronchitis and placed on penicillin.  She states that her cough has persisted and she has green-yellow sputum.  However she has not frankly short of breath.  She denies any hemoptysis. In the emergency department, the patient was afebrile hemodynamically stable saturating 93-96% on room air.  BMP shows sodium 134 with serum creatinine 1.39.  LFTs were unremarkable.  WBC was 30.2.  CT of the abdomen and pelvis showed a right chest wall dermal inclusion cyst.  There was gallbladder sludge.  Otherwise there was no acute findings.  Right upper quadrant ultrasound was negative.  Urinalysis was showed 6-10 WBC.  The patient was started levofloxacin.  Admission was requested for further work-up and evaluation of possible pneumonia.  Assessment/Plan: Pneumonia -The  patient's WBC count is out of proportion with her current clinical presentation -As such, I am concerned that there is a component of other infection versus just stress demargination -Blood cultures x2 sets--please note that levofloxacin had already been given -Urinalysis negative for pyuria -Check lactic acid -Procalcitonin -Check D-dimer -Urine Legionella antigen -Check Bartonella serologies as the patient has 25 cats at home that normally scratcher -Start IV fluids  Right upper quadrant abdominal pain -Etiology unclear -Appears to be improving on my examination -CT abdomen and right upper quadrant ultrasound as discussed above--unremarkable -Start full liquid diet and monitor clinically -Lipase normal  Acute kidney injury -Secondary to volume depletion in the setting of vomiting -Baseline creatinine 0.9-1.0 - presented with creatinine 1.39 -Continue IV fluids  Chronic pain syndrome -PMP Aware queried--patient has been receiving Suboxone and pregabalin on 11/17/2018 and 11/02/2018 -These will be continued -Urine drug screen  Uncontrolled diabetes mellitus type 2 with hyperglycemia -Reduced dose Lantus -NovoLog sliding scale -Hemoglobin A1c  Essential hypertension -Continue clonidine -Holding lisinopril in the setting of AKI  Morbid Obesity -BMI 108.9 -lifestyle modification       Past Medical History:  Diagnosis Date   Anxiety    Arthritis    Bipolar disorder ([place])    Diabetes ([place]) 06/05/2015   Diabetes mellitus    Fibromyalgia    History of abnormal cervical Pap smear 05/29/2015   History of diabetes mellitus, type II 05/29/2015   Hypertension    Neuropathy    Obesity 05/29/2015   Osteoporosis    Ovarian cyst 02/20/2016   Ovarian  cyst 02/20/2016   Simple cyst rt ovary   Panic attacks    Pneumonia    Stroke [facility])    Superficial fungus infection of skin 05/29/2015   Vitamin D deficiency 06/05/2015   Past Surgical History:  Procedure  Laterality Date   CESAREAN SECTION     HERNIA REPAIR     THROAT SURGERY     Social History:  reports that she has been smoking cigarettes. She has a 6.50 pack-year smoking history. She has never used smokeless tobacco. She reports that she does not drink alcohol or use drugs.   Family History  Problem Relation Age of Onset   Cancer Mother 75       breast   Breast cancer Mother    Breast cancer Maternal Aunt    Breast cancer Maternal Grandmother    Cancer Maternal Aunt      Allergies  Allergen Reactions   Hydrocodone Itching   Latex Itching and Rash     Prior to Admission medications   Medication Sig Start Date End Date Taking? Authorizing Provider  bismuth subsalicylate (PEPTO BISMOL) 262 MG/15ML suspension Take 30 mLs by mouth daily as needed for indigestion.   Yes [provider]  cloNIDine (CATAPRES) 0.2 MG tablet Take 0.2 mg by mouth 2 (two) times daily.   Yes [provider]  insulin aspart (NOVOLOG) 100 UNIT/ML injection Inject 30 Units into the skin 3 (three) times daily with meals. 09/15/18  Yes Florian Buff, MD  Insulin Glargine (LANTUS SOLOSTAR) 100 UNIT/ML Solostar Pen Inject 80 Units into the skin at bedtime. Patient taking differently: Inject 70 Units into the skin 2 (two) times daily. Inject 70-80 units under the skin twice daily. 09/15/18  Yes Florian Buff, MD  lisinopril (PRINIVIL,ZESTRIL) 20 MG tablet Take 20 mg by mouth daily.    Yes [provider]  megestrol (MEGACE) 40 MG tablet Take 1 tablet (40 mg total) by mouth 3 (three) times daily as needed (regulate period). 03/22/17  Yes Derrek Monaco A, NP  pregabalin (LYRICA) 200 MG capsule Take 200 mg by mouth 3 (three) times daily.    Yes [provider]  SUBOXONE 8-2 MG FILM Place 2 Film under the tongue daily.  11/17/18  Yes [provider]  tiZANidine (ZANAFLEX) 4 MG capsule Take 4 mg by mouth 3 (three) times daily.   Yes [provider]    diclofenac sodium (VOLTAREN) 1 % GEL Apply 2 g topically 4 (four) times daily as needed (pain).     [provider]  fluconazole (DIFLUCAN) 100 MG tablet Take 1 tablet (100 mg total) by mouth daily. Patient not taking: Reported on 11/24/2018 03/21/18   Florian Buff, MD  Insulin Pen Needle 32G X 4 MM MISC Use 1x a day Patient not taking: Reported on 11/24/2018 11/03/18   Philemon Kingdom, MD  insulin regular human CONCENTRATED (HUMULIN R U-500 KWIKPEN) 500 UNIT/ML kwikpen Inject 90 Units into the skin 2 (two) times daily before a meal. DAW-1.  Patient cannot use vials because she is almost blind.  She needs to have pens.  Please cancel the previous prescription sent today for U500 insulin for vials and also for the U500 syringes. Patient not taking: Reported on 11/24/2018 11/03/18   Philemon Kingdom, MD  metFORMIN (GLUCOPHAGE) 500 MG tablet Take 2 tablets (1,000 mg total) by mouth 2 (two) times daily with a meal. Patient not taking: Reported on 11/24/2018 11/03/18   Philemon Kingdom, MD  penicillin v  potassium (VEETID) 500 MG tablet Take 500 mg by mouth 2 (two) times daily. 11/08/18   [provider]  Prenatal Vit-Fe Fumarate-FA (M-NATAL PLUS) 27-1 MG TABS Take 1 tablet by mouth daily. 11/06/18   [provider]  silver sulfADIAZINE (SILVADENE) 1 % cream Use to areas 2-3 times per day 03/21/18   Florian Buff, MD    Review of Systems:  Constitutional:  No weight loss, night sweats, Fevers, chills, fatigue.  Head&Eyes: No headache.  No vision loss.  No eye pain or scotoma ENT:  No Difficulty swallowing,Tooth/dental problems,Sore throat,  No ear ache, post nasal drip,  Cardio-vascular:  No chest pain, Orthopnea, PND, swelling in lower extremities,  dizziness, palpitations  GI:  No   diarrhea, loss of appetite, hematochezia, melena, heartburn, indigestion, Resp:  No shortness of breath with exertion or at rest.  No coughing up of blood .No wheezing.No chest wall  deformity  Skin:  no rash or lesions.  GU:  no dysuria, change in color of urine, no urgency or frequency. No flank pain.  Musculoskeletal:  No joint pain or swelling. No decreased range of motion. No back pain. Complains of chronic back pain Psych:  No change in mood or affect. No depression or anxiety. Neurologic: No headache, no dysesthesia, no focal weakness, no vision loss. No syncope  Physical Exam: Vitals:   11/24/18 0530 11/24/18 0600 11/24/18 0630 11/24/18 1200  BP: (!) 175/95 (!) 158/76 (!) 148/76 (!) 169/84  Pulse: (!) 101 95 93 92  Resp: 18 18 18    Temp:      TempSrc:      SpO2: 99% 91% 93% 96%  Weight:      Height:       General:  A&O x 3, NAD, nontoxic, pleasant/cooperative Head/Eye: No conjunctival hemorrhage, no icterus, [place]/AT, No nystagmus ENT:  No icterus,  No thrush, good dentition, no pharyngeal exudate Neck:  No masses, no lymphadenpathy, no bruits CV:  RRR, no rub, no gallop, no S3 Lung: bibasilar crackles, no weheeze Abdomen: soft/NT, +BS, nondistended, no peritoneal signs Ext: No cyanosis, No rashes, No petechiae, No lymphangitis, No edema Neuro: CNII-XII intact, strength 4/5 in bilateral upper and lower extremities, no dysmetria  Labs on Admission:  Basic Metabolic Panel: Recent Labs  Lab 11/24/18 0257  NA 134*  K 3.7  CL 98  CO2 28  GLUCOSE 224*  BUN 24*  CREATININE 1.39*  CALCIUM 8.6*   Liver Function Tests: Recent Labs  Lab 11/24/18 0257  AST 15  ALT 13  ALKPHOS 82  BILITOT 0.5  PROT 7.3  ALBUMIN 2.9*   Recent Labs  Lab 11/24/18 0257  LIPASE 17   No results for input(s): AMMONIA in the last 168 hours. CBC: Recent Labs  Lab 11/24/18 0257  WBC 30.2*  NEUTROABS 26.6*  HGB 13.5  HCT 43.2  MCV 81.1  PLT 342   Coagulation Profile: No results for input(s): INR, PROTIME in the last 168 hours. Cardiac Enzymes: No results for input(s): CKTOTAL, CKMB, CKMBINDEX, TROPONINI in the last 168 hours. BNP: Invalid input(s):  POCBNP CBG: No results for input(s): GLUCAP in the last 168 hours. Urine analysis:    Component Value Date/Time   COLORURINE YELLOW 11/24/2018 0245   APPEARANCEUR CLOUDY (A) 11/24/2018 0245   APPEARANCEUR Cloudy (A) 12/20/2017 1138   LABSPEC >1.046 (H) 11/24/2018 0245   PHURINE 6.0 11/24/2018 0245   GLUCOSEU 150 (A) 11/24/2018 0245   HGBUR MODERATE (A) 11/24/2018 0245   BILIRUBINUR NEGATIVE  11/24/2018 0245   BILIRUBINUR Negative 12/20/2017 1138   [place] 11/24/2018 0245   PROTEINUR >=300 (A) 11/24/2018 0245   UROBILINOGEN 0.2 11/28/2011 0642   NITRITE NEGATIVE 11/24/2018 0245   LEUKOCYTESUR NEGATIVE 11/24/2018 0245   Sepsis Labs: @LABRCNTIP (procalcitonin:4,lacticidven:4) )No results found for this or any previous visit (from the past 240 hour(s)).   Radiological Exams on Admission: Ct Abdomen Pelvis W Contrast  Result Date: 11/24/2018 CLINICAL DATA:  Acute generalized abdominal pain. Vomiting. Leukocytosis. EXAM: CT ABDOMEN AND PELVIS WITH CONTRAST TECHNIQUE: Multidetector CT imaging of the abdomen and pelvis was performed using the standard protocol following bolus administration of intravenous contrast. CONTRAST:  19mL OMNIPAQUE IOHEXOL 300 MG/ML  SOLN COMPARISON:  11/30/2016 FINDINGS: Lower chest: Presumed dermal inclusion cyst along the subcutaneous right lateral chest wall. No superimposed inflammation. Hepatobiliary: No focal liver abnormality.Layering high-density in the gallbladder could be sludge or calculi. No common bile duct dilatation. Pancreas: Possible generalized atrophy.  No acute or focal finding Spleen: Unremarkable. Adrenals/Urinary Tract: Low-density right adrenal mass measuring 25 mm. No significant growth from prior and findings consistent with adenoma. No hydronephrosis or stone. Unremarkable bladder. Stomach/Bowel:  No obstruction. No appendicitis. Vascular/Lymphatic: No acute vascular abnormality. No mass or adenopathy. Reproductive:No pathologic  findings. Small simple by CT bilateral ovarian cysts or follicles measuring up to 3.1 cm. Other: No ascites or pneumoperitoneum. Umbilical hernia repair using mesh Musculoskeletal: No acute abnormalities. IMPRESSION: 1. No acute finding. 2. Sludge or small gallbladder calculi without findings of acute cholecystitis. Electronically Signed   By: Monte Fantasia M.D.   On: 11/24/2018 04:13   Dg Chest Port 1 View  Result Date: 11/24/2018 CLINICAL DATA:  Cough EXAM: PORTABLE CHEST 1 VIEW COMPARISON:  June 14, 2016 FINDINGS: The heart size and mediastinal contours are within normal limits. Mildly hazy airspace opacity seen at the right lung base. No pleural effusion. The visualized skeletal structures are unremarkable. IMPRESSION: Nonspecific mildly hazy airspace opacity at the right lung base which could be due to atelectasis and/or early infectious etiology. Electronically Signed   By: Prudencio Pair M.D.   On: 11/24/2018 11:07   US Abdomen Limited Ruq  Result Date: 11/24/2018 CLINICAL DATA:  Lower abdominal pain EXAM: ULTRASOUND ABDOMEN LIMITED RIGHT UPPER QUADRANT COMPARISON:  None. FINDINGS: Gallbladder: No gallstones or wall thickening visualized. No sonographic Murphy sign noted by sonographer. Common bile duct: Diameter: 4 mm Liver: There is portal vein is patent on color Doppler imaging with normal direction of blood flow towards the liver. Other: None. IMPRESSION: Normal right upper quadrant ultrasound. Electronically Signed   By: Macy Mis M.D.   On: 11/24/2018 08:37        Time spent:60 minutes Code Status:   FULL Family Communication:  No Family at bedside Disposition Plan: expect 2-3 day hospitalization Consults called: none DVT Prophylaxis: [place] Lovenox  Orson Eva, DO  [facility] Pager [phone]  If 7PM-7AM, please contact night-coverage [email or web address] Password [facility] 11/24/2018, 4:25 PM

## 2018-11-24 NOTE — ED Provider Notes (Signed)
[facility] EMERGENCY DEPARTMENT Provider Note   CSN: [id number] Arrival date & time: 11/24/18  0235     History   Chief Complaint Chief Complaint  Patient presents with  . Abdominal Pain    HPI Felicia Reynolds is a 38 y.o. female.     The history is provided by the patient.  Abdominal Pain Pain location:  RUQ Pain quality: stabbing   Pain radiates to:  Does not radiate Pain severity:  Severe Onset quality:  Sudden Duration:  9 hours Timing:  Constant Progression:  Worsening Chronicity:  New Relieved by:  Nothing Worsened by:  Palpation and movement Associated symptoms: nausea, vaginal bleeding and vomiting   Associated symptoms: no chest pain, no diarrhea, no dysuria and no fever    Patient reports sudden onset of right upper quadrant abdominal pain over 9 hours ago.  She reports it is stabbing and worsening.  She reports that she had nausea and vomiting.  Past Medical History:  Diagnosis Date  . Anxiety   . Arthritis   . Bipolar disorder ([place])   . Diabetes ([place]) 06/05/2015  . Diabetes mellitus   . Fibromyalgia   . History of abnormal cervical Pap smear 05/29/2015  . History of diabetes mellitus, type II 05/29/2015  . Hypertension   . Neuropathy   . Obesity 05/29/2015  . Osteoporosis   . Ovarian cyst 02/20/2016  . Ovarian cyst 02/20/2016   Simple cyst rt ovary  . Panic attacks   . Pneumonia   . Stroke ([place])   . Superficial fungus infection of skin 05/29/2015  . Vitamin D deficiency 06/05/2015    Patient Active Problem List   Diagnosis Date Noted  . Uncontrolled type 2 diabetes mellitus with peripheral neuropathy ([place]) 11/03/2018  . Left sided numbness 01/29/2018  . Hypertension 01/29/2018  . Dysuria 08/25/2017  . Acute pyelonephritis 11/30/2016  . Hyponatremia 11/30/2016  . Vulvovaginal candidiasis 11/30/2016  . Urinary frequency 10/29/2016  . Burning with urination 10/29/2016  . Urinary tract infection without hematuria 10/29/2016  . Recurrent acute  serous otitis media of right ear 10/29/2016  . Menorrhagia with irregular cycle 10/29/2016  . Personal history of noncompliance with medical treatment, presenting hazards to health 05/05/2016  . Essential hypertension, benign 05/05/2016  . Ovarian cyst 02/20/2016  . Uncontrolled diabetes mellitus with hyperglycemia ([place]) 06/05/2015  . Vitamin D deficiency 06/05/2015  . Superficial fungus infection of skin 05/29/2015  . Morbid obesity ([place]) 05/29/2015  . History of abnormal cervical Pap smear 05/29/2015    Past Surgical History:  Procedure Laterality Date  . CESAREAN SECTION    . HERNIA REPAIR    . THROAT SURGERY       OB History    Gravida  6   Para  1   Term  0   Preterm  0   AB  5   Living  1     SAB  4   TAB  0   Ectopic  1   Multiple  0   Live Births               Home Medications    Prior to Admission medications   Medication Sig Start Date End Date Taking? Authorizing Provider  bismuth subsalicylate (PEPTO BISMOL) 262 MG/15ML suspension Take 30 mLs by mouth daily as needed for indigestion.    [provider]  cloNIDine (CATAPRES) 0.2 MG tablet Take 0.2 mg by mouth 2 (two) times daily.    [provider]  diclofenac sodium (VOLTAREN) 1 % GEL Apply 2 g topically 4 (four) times daily as needed (pain).     [provider]  fluconazole (DIFLUCAN) 100 MG tablet Take 1 tablet (100 mg total) by mouth daily. 03/21/18   Florian Buff, MD  insulin aspart (NOVOLOG) 100 UNIT/ML injection Inject 30 Units into the skin 3 (three) times daily with meals. 09/15/18   Florian Buff, MD  Insulin Glargine (LANTUS SOLOSTAR) 100 UNIT/ML Solostar Pen Inject 80 Units into the skin at bedtime. Patient taking differently: Inject 70-80 Units into the skin 2 (two) times daily. Inject 70-80 units under the skin twice daily. 09/15/18   Florian Buff, MD  Insulin Pen Needle 32G X 4 MM MISC Use 1x a day 11/03/18   Philemon Kingdom, MD  insulin regular  human CONCENTRATED (HUMULIN R U-500 KWIKPEN) 500 UNIT/ML kwikpen Inject 90 Units into the skin 2 (two) times daily before a meal. DAW-1.  Patient cannot use vials because she is almost blind.  She needs to have pens.  Please cancel the previous prescription sent today for U500 insulin for vials and also for the U500 syringes. 11/03/18   Philemon Kingdom, MD  lisinopril (PRINIVIL,ZESTRIL) 20 MG tablet Take 20 mg by mouth daily.     [provider]  megestrol (MEGACE) 40 MG tablet Take 1 tablet (40 mg total) by mouth 3 (three) times daily as needed (regulate period). 03/22/17   Estill Dooms, NP  metFORMIN (GLUCOPHAGE) 500 MG tablet Take 2 tablets (1,000 mg total) by mouth 2 (two) times daily with a meal. 11/03/18   Philemon Kingdom, MD  pregabalin (LYRICA) 200 MG capsule Take 200 mg by mouth 3 (three) times daily.     [provider]  silver sulfADIAZINE (SILVADENE) 1 % cream Use to areas 2-3 times per day 03/21/18   Florian Buff, MD  tiZANidine (ZANAFLEX) 4 MG capsule Take 4 mg by mouth 3 (three) times daily.    [provider]    Family History Family History  Problem Relation Age of Onset  . Cancer Mother 74       breast  . Breast cancer Mother   . Breast cancer Maternal Aunt   . Breast cancer Maternal Grandmother   . Cancer Maternal Aunt     Social History Social History   Tobacco Use  . Smoking status: Current Some Day Smoker    Packs/day: 0.50    Years: 13.00    Pack years: 6.50    Types: Cigarettes  . Smokeless tobacco: Never Used  Substance Use Topics  . Alcohol use: No    Alcohol/week: 0.0 standard drinks  . Drug use: No     Allergies   Latex   Review of Systems Review of Systems  Constitutional: Negative for fever.  Cardiovascular: Negative for chest pain.  Gastrointestinal: Positive for abdominal pain, nausea and vomiting. Negative for diarrhea.  Genitourinary: Positive for vaginal bleeding. Negative for dysuria.         menstrual cycle  All other systems reviewed and are negative.    Physical Exam Updated Vital Signs Ht 1.689 m (5' 6.5")   Wt 108.9 kg   BMI 38.16 kg/m   Physical Exam  CONSTITUTIONAL: Well developed/well nourished, uncomfortable. HEAD: Normocephalic/atraumatic EYES: EOMI, no icterus ENMT: Mucous membranes moist NECK: supple no meningeal signs SPINE/BACK:entire spine nontender CV: S1/S2 noted, no murmurs/rubs/gallops noted LUNGS: Lungs are clear to auscultation bilaterally, no apparent distress ABDOMEN: soft, moderate RUQ tenderness, no  rebound or guarding, bowel sounds noted throughout abdomen GU:no cva tenderness NEURO: Pt is awake/alert/appropriate, moves all extremitiesx4.  No facial droop.   EXTREMITIES: pulses normal/equal, full ROM SKIN: warm, color normal PSYCH: no abnormalities of mood noted, alert and oriented to situation  ED Treatments / Results  Labs (all labs ordered are listed, but only abnormal results are displayed) Labs Reviewed  URINALYSIS, ROUTINE W REFLEX MICROSCOPIC - Abnormal; Notable for the following components:      Result Value   APPearance CLOUDY (*)    Specific Gravity, Urine >1.046 (*)    Glucose, UA 150 (*)    Hgb urine dipstick MODERATE (*)    Protein, ur >=300 (*)    Bacteria, UA RARE (*)    All other components within normal limits  CBC WITH DIFFERENTIAL/PLATELET - Abnormal; Notable for the following components:   WBC 30.2 (*)    RBC 5.33 (*)    MCH 25.3 (*)    RDW 18.6 (*)    Neutro Abs 26.6 (*)    Monocytes Absolute 1.1 (*)    Abs Immature Granulocytes 0.13 (*)    All other components within normal limits  COMPREHENSIVE METABOLIC PANEL - Abnormal; Notable for the following components:   Sodium 134 (*)    Glucose, Bld 224 (*)    BUN 24 (*)    Creatinine, Ser 1.39 (*)    Calcium 8.6 (*)    Albumin 2.9 (*)    GFR calc non Af Amer 47 (*)    GFR calc Af Amer 55 (*)    All other components within normal limits  LIPASE, BLOOD   I-STAT BETA HCG BLOOD, ED ([facility], [facility], [facility] ONLY)    EKG None  Radiology Ct Abdomen Pelvis W Contrast  Result Date: 11/24/2018 CLINICAL DATA:  Acute generalized abdominal pain. Vomiting. Leukocytosis. EXAM: CT ABDOMEN AND PELVIS WITH CONTRAST TECHNIQUE: Multidetector CT imaging of the abdomen and pelvis was performed using the standard protocol following bolus administration of intravenous contrast. CONTRAST:  187mL OMNIPAQUE IOHEXOL 300 MG/ML  SOLN COMPARISON:  11/30/2016 FINDINGS: Lower chest: Presumed dermal inclusion cyst along the subcutaneous right lateral chest wall. No superimposed inflammation. Hepatobiliary: No focal liver abnormality.Layering high-density in the gallbladder could be sludge or calculi. No common bile duct dilatation. Pancreas: Possible generalized atrophy.  No acute or focal finding Spleen: Unremarkable. Adrenals/Urinary Tract: Low-density right adrenal mass measuring 25 mm. No significant growth from prior and findings consistent with adenoma. No hydronephrosis or stone. Unremarkable bladder. Stomach/Bowel:  No obstruction. No appendicitis. Vascular/Lymphatic: No acute vascular abnormality. No mass or adenopathy. Reproductive:No pathologic findings. Small simple by CT bilateral ovarian cysts or follicles measuring up to 3.1 cm. Other: No ascites or pneumoperitoneum. Umbilical hernia repair using mesh Musculoskeletal: No acute abnormalities. IMPRESSION: 1. No acute finding. 2. Sludge or small gallbladder calculi without findings of acute cholecystitis. Electronically Signed   By: Monte Fantasia M.D.   On: 11/24/2018 04:13    Procedures Procedures    Medications Ordered in ED Medications  fentaNYL (SUBLIMAZE) injection 100 mcg (100 mcg Intravenous Given 11/24/18 0310)  ondansetron (ZOFRAN) injection 4 mg (4 mg Intravenous Given 11/24/18 0310)  fentaNYL (SUBLIMAZE) injection 100 mcg (100 mcg Intravenous Given 11/24/18 0340)  iohexol (OMNIPAQUE) 300 MG/ML solution 100 mL (100  mLs Intravenous Contrast Given 11/24/18 0357)  sodium chloride 0.9 % bolus 1,000 mL (0 mLs Intravenous Stopped 11/24/18 0640)  fentaNYL (SUBLIMAZE) injection 50 mcg (50 mcg Intravenous Given 11/24/18 0640)  ondansetron (ZOFRAN) injection 4  mg (4 mg Intravenous Given 11/24/18 0640)     Initial Impression / Assessment and Plan / ED Course  I have reviewed the triage vital signs and the nursing notes.  Pertinent labs & imaging results that were available during my care of the patient were reviewed by me and considered in my medical decision making (see chart for details).        3:29 AM Patient with abdominal pain, now localizing more to the right lower quadrant.  She has an elevated white count.  We will proceed with CT imaging 6:11 AM Patient has continued abdominal pain mostly on the right side.  CT imaging is unrevealing. Due to elevated white count and continued abdominal pain, will proceed with right upper quadrant ultrasound 7:04 AM Signed out to Dr. Roderic Palau with RUQ ultrasound pending  Final Clinical Impressions(s) / ED Diagnoses   Final diagnoses:  RUQ pain    ED Discharge Orders    None       Ripley Fraise, MD 11/24/18 [phone]

## 2018-11-24 NOTE — ED Notes (Signed)
Patient transported to CT 

## 2018-11-24 NOTE — ED Triage Notes (Signed)
Lower abd pain onset yesterday with vomiting, no diarrhea.

## 2018-11-24 NOTE — ED Provider Notes (Signed)
Patient had an ultrasound that did not show any gallbladder disease.  She had a chest x-ray that suggested possible infiltrate right lower lobe.  She was admitted for leukocytosis and possible pneumonia   Milton Ferguson, MD 11/24/18 1547

## 2018-11-25 DIAGNOSIS — I10 Essential (primary) hypertension: Secondary | ICD-10-CM

## 2018-11-25 LAB — CBC
HCT: 34.1 % — ABNORMAL LOW (ref 36.0–46.0)
Hemoglobin: 10.3 g/dL — ABNORMAL LOW (ref 12.0–15.0)
MCH: 25.1 pg — ABNORMAL LOW (ref 26.0–34.0)
MCHC: 30.2 g/dL (ref 30.0–36.0)
MCV: 83 fL (ref 80.0–100.0)
Platelets: 231 10*3/uL (ref 150–400)
RBC: 4.11 MIL/uL (ref 3.87–5.11)
RDW: 18.3 % — ABNORMAL HIGH (ref 11.5–15.5)
WBC: 10.2 10*3/uL (ref 4.0–10.5)
nRBC: 0 % (ref 0.0–0.2)

## 2018-11-25 LAB — BASIC METABOLIC PANEL
Anion gap: 6 (ref 5–15)
BUN: 29 mg/dL — ABNORMAL HIGH (ref 6–20)
CO2: 29 mmol/L (ref 22–32)
Calcium: 8.2 mg/dL — ABNORMAL LOW (ref 8.9–10.3)
Chloride: 103 mmol/L (ref 98–111)
Creatinine, Ser: 1.79 mg/dL — ABNORMAL HIGH (ref 0.44–1.00)
GFR calc Af Amer: 40 mL/min — ABNORMAL LOW (ref >60)
GFR calc non Af Amer: 35 mL/min — ABNORMAL LOW (ref >60)
Glucose, Bld: 167 mg/dL — ABNORMAL HIGH (ref 70–99)
Potassium: 4.4 mmol/L (ref 3.5–5.1)
Sodium: 138 mmol/L (ref 135–145)

## 2018-11-25 LAB — HEMOGLOBIN A1C
Hgb A1c MFr Bld: 8.4 % — ABNORMAL HIGH (ref 4.8–5.6)
Hgb A1c MFr Bld: 8.3 % — ABNORMAL HIGH (ref 4.8–5.6)
Mean Plasma Glucose: 194.38 mg/dL
Mean Plasma Glucose: 191.51 mg/dL

## 2018-11-25 LAB — GLUCOSE, CAPILLARY
Glucose-Capillary: 187 mg/dL — ABNORMAL HIGH (ref 70–99)
Glucose-Capillary: 153 mg/dL — ABNORMAL HIGH (ref 70–99)

## 2018-11-25 MED ORDER — AZITHROMYCIN 500 MG PO TABS: 500.0000 mg | ORAL_TABLET | Freq: Every day | ORAL | 0 refills | Status: DC

## 2018-11-25 MED ORDER — CEFDINIR 300 MG PO CAPS: 300.0000 mg | ORAL_CAPSULE | Freq: Two times a day (BID) | ORAL | 0 refills | Status: DC

## 2018-11-25 NOTE — Progress Notes (Signed)
Pt signed out AMA.  She was advised of health risks and potential financial responsibility and verbalized understanding.

## 2018-11-25 NOTE — Discharge Summary (Signed)
Physician Discharge Summary  Felicia Reynolds [id number] DOB: Dec 21, 1980 DOA: 11/24/2018  [facility]P: [facility]  Admit date: 11/24/2018 Discharge date: 11/25/2018  Admitted From: Home Disposition: [place]    Discharge Condition: AGAINST MEDICAL ADVICE  Brief/Interim Summary: 38 y.o. female with medical history of diabetes mellitus type 2, fibromyalgia, hypertension, morbid obesity, anxiety, chronic pain syndrome presenting with 1 day history of right upper quadrant pain that began on 11/22/2018 in the afternoon.  The patient states that she had at least 4 episodes of emesis the last of which she noted some blood.  She stated that abdominal pain was concentrated in the right upper quadrant and was constant and severe.  As result, she presented for further evaluation.  She denies any sick contacts.  She denies any new medications or eating any exotic or undercooked foods.  She states that her family has not been sick.  She described the pain as colicky in nature.  However, she had denied any fevers, chills, chest pain, headache, neck pain, diarrhea, hematochezia, melena, dysuria, hematuria.  She stated that approximately 1 week prior to this admission she went to see her primary care provider.  She was diagnosed with bronchitis and placed on penicillin.  She states that her cough has persisted and she has green-yellow sputum.  However she has not frankly short of breath.  She denies any hemoptysis. In the emergency department, the patient was afebrile hemodynamically stable saturating 93-96% on room air.  BMP shows sodium 134 with serum creatinine 1.39.  LFTs were unremarkable.  WBC was 30.2.  CT of the abdomen and pelvis showed a right chest wall dermal inclusion cyst.  There was gallbladder sludge.  Otherwise there was no acute findings.  Right upper quadrant ultrasound was negative.  Urinalysis was showed 6-10 WBC.  The patient was started levofloxacin.  Admission was  requested for further work-up and evaluation of possible pneumonia.  On 11/25/18, serum creatinine went up to 1.79 and D-dimer was mildly elevated.  I recommended continue evaluation including VQ scan and renal ultrasound.  The patient did not want to stay in the hospital due to social reasons.  I discussed the risks, benefits, and alternatives with the patient.  I discussed the risks involved including but not limited to worsening infection, sepsis, cardiac arrest, death.  She expressed understanding and wanted to sign out [place].  In speaking with [name], Felicia Reynolds has demonstrated the ability to understand his medical condition(s) which include renal failure, pneumonia, elevated Ddimer.  Felicia Reynolds has demonstrated the ability to appreciate how treatment forrenal failure, pneumonia, elevated Ddimer will be beneficial.   Felicia Reynolds has also demonstrated the ability to understand and appreciate how refusal of treatement forrenal failure, pneumonia, elevated Ddimer could result in harm, repeat hospitalization, and possibly death.  Felicia Reynolds demonstrates the ability to reason through the risks and benefits of the proposed treatment.  Finally, Felicia Reynolds is able to clearly communicate his/her choice.   Discharge Diagnoses:  Lobar Pneumonia -The patient's WBC count is out of proportion with her current clinical presentation -As such, I am concerned that there is a component of other infection versus just stress demargination -Blood cultures x2 sets--please note that levofloxacin had already been given -Urinalysis negative for pyuria -Check lactic acid--0.8 -Procalcitonin--0.10 -Check D-dimer--0.70 -Urine Legionella antigen -Check Bartonella serologies as the patient has 25 cats at home that normally scratch her--pt has innumerable excoriations on her legs -Started  IV fluids -Rx provided for cefdinir and azithromycin despite patient signing out  AMA  Right upper quadrant abdominal pain -Etiology unclear -Appears to be improving on my examination -CT abdomen and right upper quadrant ultrasound as discussed above--unremarkable -Lipase normal -overall improved -vomiting improved, diet advanced  Acute kidney injury -Secondary to volume depletion in the setting of vomiting -Baseline creatinine 0.9-1.0 - presented with creatinine 1.39 -serum creatinine up to 1.79 on 11/25/18 -Continue IV fluids -Patient did not want to stay in the hospital any longer for further evaluation  Chronic pain syndrome -PMP Aware queried--patient has been receiving Suboxone and pregabalin on 11/17/2018 and 11/02/2018 -These will be continued -Urine drug screen--not collected  Uncontrolled diabetes mellitus type 2 with hyperglycemia -Reduced dose Lantus -NovoLog sliding scale -Hemoglobin A1c--8.4  Essential hypertension -Continue clonidine -Holding lisinopril in the setting of AKI  Morbid Obesity -BMI 108.9 -lifestyle modification    Discharge Instructions   Allergies as of 11/25/2018      Reactions   Hydrocodone Itching   Latex Itching, Rash      Medication List    STOP taking these medications   fluconazole 100 MG tablet Commonly known as: DIFLUCAN   HumuLIN R U-500 KwikPen 500 UNIT/ML kwikpen Generic drug: insulin regular human CONCENTRATED   Insulin Pen Needle 32G X 4 MM Misc   lisinopril 20 MG tablet Commonly known as: ZESTRIL   metFORMIN 500 MG tablet Commonly known as: GLUCOPHAGE   penicillin v potassium 500 MG tablet Commonly known as: VEETID     TAKE these medications   azithromycin 500 MG tablet Commonly known as: ZITHROMAX Take 1 tablet (500 mg total) by mouth daily.   bismuth subsalicylate [id number] suspension Commonly known as: PEPTO BISMOL Take 30 mLs by mouth daily as needed for indigestion.   cefdinir 300 MG capsule Commonly known as: OMNICEF Take 1 capsule (300 mg total) by mouth 2  (two) times daily.   cloNIDine 0.2 MG tablet Commonly known as: CATAPRES Take 0.2 mg by mouth 2 (two) times daily.   insulin aspart 100 UNIT/ML injection Commonly known as: NovoLOG Inject 30 Units into the skin 3 (three) times daily with meals.   Lantus SoloStar 100 UNIT/ML Solostar Pen Generic drug: Insulin Glargine Inject 80 Units into the skin at bedtime. What changed:   how much to take  when to take this  additional instructions   M-Natal Plus 27-1 MG Tabs Take 1 tablet by mouth daily.   megestrol 40 MG tablet Commonly known as: MEGACE Take 1 tablet (40 mg total) by mouth 3 (three) times daily as needed (regulate period).   pregabalin 200 MG capsule Commonly known as: LYRICA Take 200 mg by mouth 3 (three) times daily.   silver sulfADIAZINE 1 % cream Commonly known as: Silvadene Use to areas 2-3 times per day   Suboxone 8-2 MG Film Generic drug: Buprenorphine HCl-Naloxone HCl Place 2 Film under the tongue daily.   tiZANidine 4 MG capsule Commonly known as: ZANAFLEX Take 4 mg by mouth 3 (three) times daily.   Voltaren 1 % Gel Generic drug: diclofenac sodium Apply 2 g topically 4 (four) times daily as needed (pain).       Allergies  Allergen Reactions   Hydrocodone Itching   Latex Itching and Rash    Consultations:  none   Procedures/Studies: Ct Abdomen Pelvis W Contrast  Result Date: 11/24/2018 CLINICAL DATA:  Acute generalized abdominal pain. Vomiting. Leukocytosis. EXAM: CT ABDOMEN AND PELVIS WITH CONTRAST TECHNIQUE: Multidetector CT imaging  of the abdomen and pelvis was performed using the standard protocol following bolus administration of intravenous contrast. CONTRAST:  151mL OMNIPAQUE IOHEXOL 300 MG/ML  SOLN COMPARISON:  11/30/2016 FINDINGS: Lower chest: Presumed dermal inclusion cyst along the subcutaneous right lateral chest wall. No superimposed inflammation. Hepatobiliary: No focal liver abnormality.Layering high-density in the  gallbladder could be sludge or calculi. No common bile duct dilatation. Pancreas: Possible generalized atrophy.  No acute or focal finding Spleen: Unremarkable. Adrenals/Urinary Tract: Low-density right adrenal mass measuring 25 mm. No significant growth from prior and findings consistent with adenoma. No hydronephrosis or stone. Unremarkable bladder. Stomach/Bowel:  No obstruction. No appendicitis. Vascular/Lymphatic: No acute vascular abnormality. No mass or adenopathy. Reproductive:No pathologic findings. Small simple by CT bilateral ovarian cysts or follicles measuring up to 3.1 cm. Other: No ascites or pneumoperitoneum. Umbilical hernia repair using mesh Musculoskeletal: No acute abnormalities. IMPRESSION: 1. No acute finding. 2. Sludge or small gallbladder calculi without findings of acute cholecystitis. Electronically Signed   By: Monte Fantasia M.D.   On: 11/24/2018 04:13   Dg Chest Port 1 View  Result Date: 11/24/2018 CLINICAL DATA:  Cough EXAM: PORTABLE CHEST 1 VIEW COMPARISON:  June 14, 2016 FINDINGS: The heart size and mediastinal contours are within normal limits. Mildly hazy airspace opacity seen at the right lung base. No pleural effusion. The visualized skeletal structures are unremarkable. IMPRESSION: Nonspecific mildly hazy airspace opacity at the right lung base which could be due to atelectasis and/or early infectious etiology. Electronically Signed   By: Prudencio Pair M.D.   On: 11/24/2018 11:07   US Abdomen Limited Ruq  Result Date: 11/24/2018 CLINICAL DATA:  Lower abdominal pain EXAM: ULTRASOUND ABDOMEN LIMITED RIGHT UPPER QUADRANT COMPARISON:  None. FINDINGS: Gallbladder: No gallstones or wall thickening visualized. No sonographic Murphy sign noted by sonographer. Common bile duct: Diameter: 4 mm Liver: There is portal vein is patent on color Doppler imaging with normal direction of blood flow towards the liver. Other: None. IMPRESSION: Normal right upper quadrant ultrasound.  Electronically Signed   By: Macy Mis M.D.   On: 11/24/2018 08:37         Discharge Exam: Vitals:   11/24/18 2101 11/25/18 0523  BP: (!) 197/99 (!) 186/94  Pulse: 97 93  Resp: 18 17  Temp: 97.9 F (36.6 C) 98.7 F (37.1 C)  SpO2: 99% 97%   Vitals:   11/24/18 1200 11/24/18 1708 11/24/18 2101 11/25/18 0523  BP: (!) 169/84 (!) 156/87 (!) 197/99 (!) 186/94  Pulse: 92 88 97 93  Resp:  17 18 17   Temp:   97.9 F (36.6 C) 98.7 F (37.1 C)  TempSrc:   Oral Oral  SpO2: 96% 98% 99% 97%  Weight:      Height:        General: Pt is alert, awake, not in acute distress Cardiovascular: RRR, S1/S2 +, no rubs, no gallops Respiratory: Bibasilar rales.  No wheezing Abdominal: Soft, NT, ND, bowel sounds + Extremities: Innumerable excoriations on the bilateral lower extremities without lymphangitis or necrosis   The results of significant diagnostics from this hospitalization (including imaging, microbiology, ancillary and laboratory) are listed below for reference.    Significant Diagnostic Studies: Ct Abdomen Pelvis W Contrast  Result Date: 11/24/2018 CLINICAL DATA:  Acute generalized abdominal pain. Vomiting. Leukocytosis. EXAM: CT ABDOMEN AND PELVIS WITH CONTRAST TECHNIQUE: Multidetector CT imaging of the abdomen and pelvis was performed using the standard protocol following bolus administration of intravenous contrast. CONTRAST:  152mL OMNIPAQUE IOHEXOL 300 MG/ML  SOLN COMPARISON:  11/30/2016 FINDINGS: Lower chest: Presumed dermal inclusion cyst along the subcutaneous right lateral chest wall. No superimposed inflammation. Hepatobiliary: No focal liver abnormality.Layering high-density in the gallbladder could be sludge or calculi. No common bile duct dilatation. Pancreas: Possible generalized atrophy.  No acute or focal finding Spleen: Unremarkable. Adrenals/Urinary Tract: Low-density right adrenal mass measuring 25 mm. No significant growth from prior and findings consistent with  adenoma. No hydronephrosis or stone. Unremarkable bladder. Stomach/Bowel:  No obstruction. No appendicitis. Vascular/Lymphatic: No acute vascular abnormality. No mass or adenopathy. Reproductive:No pathologic findings. Small simple by CT bilateral ovarian cysts or follicles measuring up to 3.1 cm. Other: No ascites or pneumoperitoneum. Umbilical hernia repair using mesh Musculoskeletal: No acute abnormalities. IMPRESSION: 1. No acute finding. 2. Sludge or small gallbladder calculi without findings of acute cholecystitis. Electronically Signed   By: Monte Fantasia M.D.   On: 11/24/2018 04:13   Dg Chest Port 1 View  Result Date: 11/24/2018 CLINICAL DATA:  Cough EXAM: PORTABLE CHEST 1 VIEW COMPARISON:  June 14, 2016 FINDINGS: The heart size and mediastinal contours are within normal limits. Mildly hazy airspace opacity seen at the right lung base. No pleural effusion. The visualized skeletal structures are unremarkable. IMPRESSION: Nonspecific mildly hazy airspace opacity at the right lung base which could be due to atelectasis and/or early infectious etiology. Electronically Signed   By: Prudencio Pair M.D.   On: 11/24/2018 11:07   US Abdomen Limited Ruq  Result Date: 11/24/2018 CLINICAL DATA:  Lower abdominal pain EXAM: ULTRASOUND ABDOMEN LIMITED RIGHT UPPER QUADRANT COMPARISON:  None. FINDINGS: Gallbladder: No gallstones or wall thickening visualized. No sonographic Murphy sign noted by sonographer. Common bile duct: Diameter: 4 mm Liver: There is portal vein is patent on color Doppler imaging with normal direction of blood flow towards the liver. Other: None. IMPRESSION: Normal right upper quadrant ultrasound. Electronically Signed   By: Macy Mis M.D.   On: 11/24/2018 08:37     Microbiology: Recent Results (from the past 240 hour(s))  Culture, blood (routine x 2)     Status: None (Preliminary result)   Collection Time: 11/24/18  4:33 PM   Specimen: BLOOD LEFT WRIST  Result Value Ref Range  Status   Specimen Description BLOOD LEFT WRIST  Final   Special Requests   Final    BOTTLES DRAWN AEROBIC AND ANAEROBIC Blood Culture adequate volume   Culture   Final    NO GROWTH < 24 HOURS Performed at [facility], [address], [place], [place] [address]    Report Status PENDING  Incomplete  Culture, blood (routine x 2)     Status: None (Preliminary result)   Collection Time: 11/24/18  4:47 PM   Specimen: Right Antecubital; Blood  Result Value Ref Range Status   Specimen Description RIGHT ANTECUBITAL  Final   Special Requests   Final    BOTTLES DRAWN AEROBIC AND ANAEROBIC Blood Culture adequate volume   Culture   Final    NO GROWTH < 24 HOURS Performed at [facility], [address] [place], [place] [address]    Report Status PENDING  Incomplete     Labs: Basic Metabolic Panel: Recent Labs  Lab 11/24/18 0257 11/25/18 0644  NA 134* 138  K 3.7 4.4  CL 98 103  CO2 28 29  GLUCOSE 224* 167*  BUN 24* 29*  CREATININE 1.39* 1.79*  CALCIUM 8.6* 8.2*   Liver Function Tests: Recent Labs  Lab 11/24/18 0257  AST 15  ALT 13  ALKPHOS 82  BILITOT 0.5  PROT 7.3  ALBUMIN 2.9*   Recent Labs  Lab 11/24/18 0257  LIPASE 17   No results for input(s): AMMONIA in the last 168 hours. CBC: Recent Labs  Lab 11/24/18 0257 11/25/18 0644  WBC 30.2* 10.2  NEUTROABS 26.6*  --   HGB 13.5 10.3*  HCT 43.2 34.1*  MCV 81.1 83.0  PLT 342 231   Cardiac Enzymes: No results for input(s): CKTOTAL, CKMB, CKMBINDEX, TROPONINI in the last 168 hours. BNP: Invalid input(s): POCBNP CBG: Recent Labs  Lab 11/24/18 2102 11/25/18 0804  GLUCAP 155* 187*    Time coordinating discharge:  36 minutes  Signed:  Orson Eva, DO [facility] Pager: [phone] 11/25/2018, 11:53 AM

## 2018-11-25 NOTE — Progress Notes (Signed)
Patient noted to have POC covid test from ED resulted on review of am labs. Still needed covid swab obtained per report. Notified MD. Order placed for covid swab. Discussed with patient. Obtained swab. Sent to lab this am. Donavan Foil, RN

## 2018-11-25 NOTE — Progress Notes (Addendum)
Patient stated "I fell in the bathroom earlier. I couldn't feel my legs and they gave out." nursing asked if she had called for assistance. Stated "yes, I called for help in the bathroom and they said someone would be coming." patient did not call RN to notify of fall. Vitals stable, neuro assessment within normal limits. Denies dizziness, prior to or after fall. States did not hit her head. No bruising/abrasions noted. Stated "I use a wheelchair and walker at home. That walker in there made me fall." denies pain or discomfort. States she still wants to sign out AMA. Notified Dr. Carles Collet. No new orders at this time. MD aware she would like to sign out AMA. bedalarm on for safety, fall mats in place. Yellow socks and armband applied. Discussed fall prevention/safety plan with patient. Verbalized understanding to call for assistance and not attempt getting up on her own. Donavan Foil, RN After clarification, patient stated she fell "at 1020 this morning".

## 2018-11-26 LAB — SARS CORONAVIRUS 2 (TAT 6-24 HRS): SARS Coronavirus 2: NEGATIVE

## 2018-11-27 LAB — BARTONELLA ANTIBODY PANEL
B Quintana IgM: NEGATIVE titer
B henselae IgG: NEGATIVE titer
B henselae IgM: NEGATIVE titer
B quintana IgG: NEGATIVE titer

## 2018-11-29 LAB — CULTURE, BLOOD (ROUTINE X 2)
Culture: NO GROWTH
Culture: NO GROWTH
Special Requests: ADEQUATE
Special Requests: ADEQUATE

## 2018-12-04 ENCOUNTER — Telehealth: Payer: Self-pay | Admitting: *Deleted

## 2018-12-04 NOTE — Telephone Encounter (Signed)
Pt needs a refill on Megace 40 mg TID. Thanks!!

## 2018-12-04 NOTE — Telephone Encounter (Signed)
Left message @ 4:47 pm. JSY

## 2018-12-05 ENCOUNTER — Other Ambulatory Visit: Payer: Self-pay | Admitting: Obstetrics & Gynecology

## 2018-12-05 MED ORDER — MEGESTROL ACETATE 40 MG PO TABS: 40.0000 mg | ORAL_TABLET | Freq: Three times a day (TID) | ORAL | 1 refills | Status: AC | PRN

## 2018-12-05 NOTE — Telephone Encounter (Signed)
Pt is requesting a refill on Megace 40 mg. Please advise. Thanks!! [place]

## 2018-12-07 ENCOUNTER — Telehealth: Payer: Self-pay | Admitting: Adult Health

## 2018-12-07 NOTE — Telephone Encounter (Signed)

## 2018-12-08 ENCOUNTER — Ambulatory Visit: Payer: Medicaid Other | Admitting: Adult Health

## 2018-12-20 ENCOUNTER — Ambulatory Visit: Payer: Medicaid Other | Admitting: Internal Medicine

## 2019-03-13 ENCOUNTER — Other Ambulatory Visit: Payer: Self-pay | Admitting: Obstetrics & Gynecology

## 2019-03-29 ENCOUNTER — Other Ambulatory Visit: Payer: Self-pay | Admitting: Obstetrics & Gynecology

## 2019-05-01 ENCOUNTER — Ambulatory Visit: Payer: Medicaid Other | Admitting: Internal Medicine

## 2019-05-02 DIAGNOSIS — Z029 Encounter for administrative examinations, unspecified: Secondary | ICD-10-CM

## 2019-06-11 ENCOUNTER — Other Ambulatory Visit: Payer: Self-pay

## 2019-06-11 ENCOUNTER — Ambulatory Visit: Payer: Medicaid Other | Admitting: Physician Assistant

## 2019-06-11 VITALS — BP 137/77 | HR 97 | Resp 16 | Ht 66.0 in | Wt 242.0 lb

## 2019-06-11 DIAGNOSIS — M79602 Pain in left arm: Secondary | ICD-10-CM

## 2019-06-11 DIAGNOSIS — E1165 Type 2 diabetes mellitus with hyperglycemia: Secondary | ICD-10-CM

## 2019-06-11 DIAGNOSIS — T7491XA Unspecified adult maltreatment, confirmed, initial encounter: Secondary | ICD-10-CM

## 2019-06-11 DIAGNOSIS — M79642 Pain in left hand: Secondary | ICD-10-CM | POA: Diagnosis not present

## 2019-06-11 LAB — GLUCOSE, POCT (MANUAL RESULT ENTRY): POC Glucose: 199 mg/dl — AB (ref 70–99)
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
## 2019-06-11 NOTE — Patient Instructions (Signed)
Intimate Partner Violence Information Intimate partner violence, also called domestic abuse or relationship abuse, is a pattern of behaviors used by one partner to gain or maintain power and control over the other partner. Intimate partner violence can happen to women and men and can happen between people who are or were:  Married.  Dating.  Living together. What are the types of intimate partner violence? Intimate partner violence can involve physical, emotional, psychological, sexual, and economic abuse, or stalking by a current or former partner. Different types of abuse can occur at the same time within the same relationship.  Physical abuse. This includes rough handling, threats with a weapon, throwing objects, pushing, or hitting.  Emotional and psychological abuse. This includes verbal attacks, rejection, humiliation, intimidation, social isolation, or threats. Abuse may also include limiting contact with family and friends.  Sexual assault. Sexual assault is any unwanted sexual activity that occurs without clear permission (consent) from both people. This includes unwanted touching and sexual harassment.  Economic abuse. This includes controlling money, food, transportation, or other belongings.  Stalking. This involves such things as repeated, unwanted phone calls, e-mails, or text messages, or watching the victim from a distance. What are some warning signs of intimate partner violence? Physical signs  Bruises.  Broken bones.  Burns or cuts.  Physical pain.  Head injury. Emotional and psychological signs  Crying.  Depression.  Hopelessness.  Desperation.  Trouble sleeping.  Fear of the partner.  Anxiety.  Suicidal thoughts or behavior.  Antisocial behavior.  Low self-esteem.  Fear of intimacy.  Flashbacks. Sexual signs  Bruising, swelling, or bleeding of the genital or rectal area.  Signs of an STI, such as genital sores, warts, or discharge  coming from the genital area.  Pain in the genital area.  Unintended pregnancy.  Problems with pregnancy. What are common behaviors of those affected by intimate partner violence? Those affected by intimate partner violence may:  Be late to work or other events.  Not show up to places as promised.  Have to let their partner know where they are and who they are with.  Be isolated or kept from seeing friends or family.  Make comments about their partner's temper or behavior.  Make excuses for their partner.  Engage in high-risk sexual behaviors.  Use drugs or alcohol.  Have unhealthy eating behaviors. What are common feelings of those affected by intimate partner violence? Victims of intimate partner violence may feel that they:  Must be careful not to say or do things that trigger their partner's anger.  Cannot do anything right.  Deserve to be treated badly.  Overreact to their partner's behavior or temper.  Cannot trust their own feelings.  Cannot trust other people.  Are trapped.  May have their children taken away by their partner.  Are emotionally drained or numb.  Are in danger.  Might have to kill their partner to survive. Where can you get help? If you do not feel safe searching for help online at home, use a computer at a [employer] to access the Internet. Call 911 if you are in immediate danger or need medical help. Intimate partner violence hotlines and websites  The [employer] Violence Hotline. ? 24-hour phone hotline: 540-481-4467 (SAFE) or 918-255-1831 (TTY). ? Videophone: available Monday through Friday, 9 a.m. to 5 p.m. Call (810)083-4764. ? thehotline.org  The National Sexual Assault Hotline. ? 24-hour phone hotline: 206-312-1737. ? safehelpline.org Shelters for victims of intimate partner violence If you are a victim  of intimate partner violence, there are resources to help you find a temporary place for you and your  children to live (shelter). The specific address of these shelters is often not known to the public. Police Report assaults, threats, and stalking to the police. Counselors and counseling centers  Counseling can help you cope with difficult emotions and empower you to plan for your future safety. The topics you discuss with a counselor are private and confidential. Children of intimate partner violence victims also might need counseling to manage stress and anxiety. The court system You can work with a Chief Executive Officer or an advocate to get legal protection against an abuser. Protection includes restraining orders and private addresses. Crimes against you, such as assault, can also be prosecuted through the courts. Laws vary by state. Follow these instructions at home:  Create a safety plan that includes ways to remain safe while you are in an abusive relationship, while you are planning to leave, or after you leave. This plan may be created by the victim alone or with assistance from the domestic violence hotline staff or local shelter staff. Your safety plan may include: ? How to cope with emotions. ? How to tell friends and family about the abuse. ? How to take legal action. ? How to create a safe home environment. ? How to keep your children safe. ? Emergency plans for life-threatening situations. Get help right away if you:  Feel like you are in immediate danger.  Feel like you may hurt yourself or others. If you ever feel like you may hurt yourself or others, or have thoughts about taking your own life, get help right away. You can go to your nearest emergency department or call:  Your local emergency services (911 in the [place]  A suicide crisis helpline, such as the [facility] at 916-629-0601. This is open 24 hours a day. Summary  If you are a victim of intimate partner violence, there are resources to help you find a temporary place for you and your children to  live (shelter).  Create a safety plan that includes ways to remain safe while you are in an abusive relationship, while you are planning to leave, or after you leave. This information is not intended to replace advice given to you by your health care provider. Make sure you discuss any questions you have with your health care provider. Document Revised: 05/30/2018 Document Reviewed: 02/04/2017 Elsevier Patient Education  [place].
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
## 2019-07-21 ENCOUNTER — Other Ambulatory Visit: Payer: Self-pay | Admitting: Obstetrics & Gynecology

## 2019-07-24 ENCOUNTER — Emergency Department (HOSPITAL_COMMUNITY)
Admission: EM | Admit: 2019-07-24 | Discharge: 2019-07-24 | Discharge status: Against Medical Advice | Payer: Medicaid Other

## 2019-07-24 ENCOUNTER — Other Ambulatory Visit: Payer: Self-pay

## 2019-09-05 ENCOUNTER — Other Ambulatory Visit: Payer: Self-pay

## 2019-09-05 ENCOUNTER — Emergency Department (HOSPITAL_COMMUNITY): Payer: Medicaid Other

## 2019-09-05 ENCOUNTER — Encounter (HOSPITAL_COMMUNITY): Payer: Self-pay

## 2019-09-05 ENCOUNTER — Emergency Department (HOSPITAL_COMMUNITY)
Admission: EM | Admit: 2019-09-05 | Discharge: 2019-09-05 | Disposition: A | Discharge status: Home / Self Care | Payer: Medicaid Other | Attending: Emergency Medicine | Admitting: Emergency Medicine

## 2019-09-05 DIAGNOSIS — M79672 Pain in left foot: Secondary | ICD-10-CM | POA: Diagnosis not present

## 2019-09-05 DIAGNOSIS — Z5321 Procedure and treatment not carried out due to patient leaving prior to being seen by health care provider: Secondary | ICD-10-CM | POA: Diagnosis not present

## 2019-09-05 LAB — CBC WITH DIFFERENTIAL/PLATELET
Abs Immature Granulocytes: 0.06 10*3/uL (ref 0.00–0.07)
Basophils Absolute: 0.1 10*3/uL (ref 0.0–0.1)
Basophils Relative: 0 %
Eosinophils Absolute: 0.1 10*3/uL (ref 0.0–0.5)
Eosinophils Relative: 0 %
HCT: 29 % — ABNORMAL LOW (ref 36.0–46.0)
Hemoglobin: 8.8 g/dL — ABNORMAL LOW (ref 12.0–15.0)
Immature Granulocytes: 0 %
Lymphocytes Relative: 8 %
Lymphs Abs: 1.3 10*3/uL (ref 0.7–4.0)
MCH: 25.3 pg — ABNORMAL LOW (ref 26.0–34.0)
MCHC: 30.3 g/dL (ref 30.0–36.0)
MCV: 83.3 fL (ref 80.0–100.0)
Monocytes Absolute: 1 10*3/uL (ref 0.1–1.0)
Monocytes Relative: 6 %
Neutro Abs: 14.9 10*3/uL — ABNORMAL HIGH (ref 1.7–7.7)
Neutrophils Relative %: 86 %
Platelets: 316 10*3/uL (ref 150–400)
RBC: 3.48 MIL/uL — ABNORMAL LOW (ref 3.87–5.11)
RDW: 16 % — ABNORMAL HIGH (ref 11.5–15.5)
WBC: 17.4 10*3/uL — ABNORMAL HIGH (ref 4.0–10.5)
nRBC: 0 % (ref 0.0–0.2)

## 2019-09-05 LAB — COMPREHENSIVE METABOLIC PANEL
ALT: 12 U/L (ref 0–44)
AST: 11 U/L — ABNORMAL LOW (ref 15–41)
Albumin: 2.6 g/dL — ABNORMAL LOW (ref 3.5–5.0)
Alkaline Phosphatase: 93 U/L (ref 38–126)
Anion gap: 8 (ref 5–15)
BUN: 30 mg/dL — ABNORMAL HIGH (ref 6–20)
CO2: 22 mmol/L (ref 22–32)
Calcium: 8.7 mg/dL — ABNORMAL LOW (ref 8.9–10.3)
Chloride: 103 mmol/L (ref 98–111)
Creatinine, Ser: 1.67 mg/dL — ABNORMAL HIGH (ref 0.44–1.00)
GFR calc Af Amer: 44 mL/min — ABNORMAL LOW (ref >60)
GFR calc non Af Amer: 38 mL/min — ABNORMAL LOW (ref >60)
Glucose, Bld: 165 mg/dL — ABNORMAL HIGH (ref 70–99)
Potassium: 5.5 mmol/L — ABNORMAL HIGH (ref 3.5–5.1)
Sodium: 133 mmol/L — ABNORMAL LOW (ref 135–145)
Total Bilirubin: 0.4 mg/dL (ref 0.3–1.2)
Total Protein: 7.5 g/dL (ref 6.5–8.1)

## 2019-09-05 LAB — LACTIC ACID, PLASMA: Lactic Acid, Venous: 0.8 mmol/L (ref 0.5–1.9)

## 2019-09-05 NOTE — ED Triage Notes (Signed)
Pt presents to ED with complaints of left foot pain. Pt states she stepped on a piece of glass 2 weeks ago and now has a wound on the bottom of her big toe.

## 2019-09-07 ENCOUNTER — Telehealth: Payer: Self-pay | Admitting: Adult Health

## 2019-09-07 ENCOUNTER — Other Ambulatory Visit: Payer: Self-pay | Admitting: Obstetrics & Gynecology

## 2019-09-07 NOTE — Telephone Encounter (Signed)
Refilled per request.

## 2019-09-07 NOTE — Telephone Encounter (Signed)
Pt states she is out of both of her novolog and lantus and patient would like refill sent to pharmacy [facility] [address] [employer] pt says she is completely out and sugar is high would like it sent to pharmacy as soon as possible.

## 2019-09-10 LAB — CULTURE, BLOOD (ROUTINE X 2)
Culture: NO GROWTH
Culture: NO GROWTH
Special Requests: ADEQUATE
Special Requests: ADEQUATE

## 2019-09-13 ENCOUNTER — Emergency Department (HOSPITAL_COMMUNITY): Payer: Medicaid Other

## 2019-09-13 ENCOUNTER — Inpatient Hospital Stay (HOSPITAL_COMMUNITY)
Admission: EM | Admit: 2019-09-13 | Discharge: 2019-09-24 | DRG: 853 | Discharge status: Against Medical Advice | Payer: Medicaid Other | Attending: Internal Medicine | Admitting: Internal Medicine

## 2019-09-13 ENCOUNTER — Encounter (HOSPITAL_COMMUNITY): Payer: Self-pay

## 2019-09-13 ENCOUNTER — Other Ambulatory Visit: Payer: Self-pay

## 2019-09-13 DIAGNOSIS — M869 Osteomyelitis, unspecified: Secondary | ICD-10-CM | POA: Diagnosis present

## 2019-09-13 DIAGNOSIS — E11621 Type 2 diabetes mellitus with foot ulcer: Secondary | ICD-10-CM | POA: Diagnosis present

## 2019-09-13 DIAGNOSIS — W25XXXA Contact with sharp glass, initial encounter: Secondary | ICD-10-CM | POA: Diagnosis present

## 2019-09-13 DIAGNOSIS — A480 Gas gangrene: Secondary | ICD-10-CM | POA: Diagnosis present

## 2019-09-13 DIAGNOSIS — M86272 Subacute osteomyelitis, left ankle and foot: Secondary | ICD-10-CM

## 2019-09-13 DIAGNOSIS — N920 Excessive and frequent menstruation with regular cycle: Secondary | ICD-10-CM | POA: Diagnosis present

## 2019-09-13 DIAGNOSIS — M86072 Acute hematogenous osteomyelitis, left ankle and foot: Secondary | ICD-10-CM | POA: Diagnosis not present

## 2019-09-13 DIAGNOSIS — E43 Unspecified severe protein-calorie malnutrition: Secondary | ICD-10-CM | POA: Diagnosis present

## 2019-09-13 DIAGNOSIS — E1169 Type 2 diabetes mellitus with other specified complication: Secondary | ICD-10-CM | POA: Diagnosis present

## 2019-09-13 DIAGNOSIS — E1122 Type 2 diabetes mellitus with diabetic chronic kidney disease: Secondary | ICD-10-CM | POA: Diagnosis present

## 2019-09-13 DIAGNOSIS — Z20822 Contact with and (suspected) exposure to covid-19: Secondary | ICD-10-CM | POA: Diagnosis present

## 2019-09-13 DIAGNOSIS — Z91040 Latex allergy status: Secondary | ICD-10-CM

## 2019-09-13 DIAGNOSIS — M810 Age-related osteoporosis without current pathological fracture: Secondary | ICD-10-CM | POA: Diagnosis present

## 2019-09-13 DIAGNOSIS — N1832 Chronic kidney disease, stage 3b: Secondary | ICD-10-CM | POA: Diagnosis present

## 2019-09-13 DIAGNOSIS — Z5329 Procedure and treatment not carried out because of patient's decision for other reasons: Secondary | ICD-10-CM | POA: Diagnosis present

## 2019-09-13 DIAGNOSIS — L02612 Cutaneous abscess of left foot: Secondary | ICD-10-CM | POA: Diagnosis present

## 2019-09-13 DIAGNOSIS — N170 Acute kidney failure with tubular necrosis: Secondary | ICD-10-CM | POA: Diagnosis present

## 2019-09-13 DIAGNOSIS — D500 Iron deficiency anemia secondary to blood loss (chronic): Secondary | ICD-10-CM | POA: Diagnosis present

## 2019-09-13 DIAGNOSIS — E1140 Type 2 diabetes mellitus with diabetic neuropathy, unspecified: Secondary | ICD-10-CM | POA: Diagnosis present

## 2019-09-13 DIAGNOSIS — N179 Acute kidney failure, unspecified: Secondary | ICD-10-CM

## 2019-09-13 DIAGNOSIS — D649 Anemia, unspecified: Secondary | ICD-10-CM | POA: Diagnosis not present

## 2019-09-13 DIAGNOSIS — E1165 Type 2 diabetes mellitus with hyperglycemia: Secondary | ICD-10-CM | POA: Diagnosis present

## 2019-09-13 DIAGNOSIS — N390 Urinary tract infection, site not specified: Secondary | ICD-10-CM | POA: Diagnosis present

## 2019-09-13 DIAGNOSIS — K769 Liver disease, unspecified: Secondary | ICD-10-CM | POA: Diagnosis present

## 2019-09-13 DIAGNOSIS — I129 Hypertensive chronic kidney disease with stage 1 through stage 4 chronic kidney disease, or unspecified chronic kidney disease: Secondary | ICD-10-CM | POA: Diagnosis present

## 2019-09-13 DIAGNOSIS — Z885 Allergy status to narcotic agent status: Secondary | ICD-10-CM

## 2019-09-13 DIAGNOSIS — Z9119 Patient's noncompliance with other medical treatment and regimen: Secondary | ICD-10-CM

## 2019-09-13 DIAGNOSIS — Z8673 Personal history of transient ischemic attack (TIA), and cerebral infarction without residual deficits: Secondary | ICD-10-CM

## 2019-09-13 DIAGNOSIS — T829XXA Unspecified complication of cardiac and vascular prosthetic device, implant and graft, initial encounter: Secondary | ICD-10-CM

## 2019-09-13 DIAGNOSIS — E872 Acidosis: Secondary | ICD-10-CM | POA: Diagnosis not present

## 2019-09-13 DIAGNOSIS — Z6841 Body Mass Index (BMI) 40.0 and over, adult: Secondary | ICD-10-CM

## 2019-09-13 DIAGNOSIS — A419 Sepsis, unspecified organism: Secondary | ICD-10-CM | POA: Diagnosis present

## 2019-09-13 DIAGNOSIS — E1152 Type 2 diabetes mellitus with diabetic peripheral angiopathy with gangrene: Secondary | ICD-10-CM | POA: Diagnosis present

## 2019-09-13 DIAGNOSIS — E785 Hyperlipidemia, unspecified: Secondary | ICD-10-CM | POA: Diagnosis present

## 2019-09-13 DIAGNOSIS — L0390 Cellulitis, unspecified: Secondary | ICD-10-CM

## 2019-09-13 DIAGNOSIS — F17210 Nicotine dependence, cigarettes, uncomplicated: Secondary | ICD-10-CM | POA: Diagnosis present

## 2019-09-13 DIAGNOSIS — L97522 Non-pressure chronic ulcer of other part of left foot with fat layer exposed: Secondary | ICD-10-CM | POA: Diagnosis not present

## 2019-09-13 DIAGNOSIS — M8600 Acute hematogenous osteomyelitis, unspecified site: Secondary | ICD-10-CM

## 2019-09-13 DIAGNOSIS — Z803 Family history of malignant neoplasm of breast: Secondary | ICD-10-CM

## 2019-09-13 DIAGNOSIS — E875 Hyperkalemia: Secondary | ICD-10-CM | POA: Diagnosis not present

## 2019-09-13 DIAGNOSIS — Z79899 Other long term (current) drug therapy: Secondary | ICD-10-CM

## 2019-09-13 DIAGNOSIS — M797 Fibromyalgia: Secondary | ICD-10-CM | POA: Diagnosis present

## 2019-09-13 DIAGNOSIS — T383X6A Underdosing of insulin and oral hypoglycemic [antidiabetic] drugs, initial encounter: Secondary | ICD-10-CM | POA: Diagnosis present

## 2019-09-13 DIAGNOSIS — Z794 Long term (current) use of insulin: Secondary | ICD-10-CM

## 2019-09-13 DIAGNOSIS — F319 Bipolar disorder, unspecified: Secondary | ICD-10-CM | POA: Diagnosis present

## 2019-09-13 DIAGNOSIS — I10 Essential (primary) hypertension: Secondary | ICD-10-CM | POA: Diagnosis not present

## 2019-09-13 DIAGNOSIS — R6520 Severe sepsis without septic shock: Secondary | ICD-10-CM | POA: Diagnosis not present

## 2019-09-13 DIAGNOSIS — E11649 Type 2 diabetes mellitus with hypoglycemia without coma: Secondary | ICD-10-CM | POA: Diagnosis not present

## 2019-09-13 LAB — URINALYSIS, ROUTINE W REFLEX MICROSCOPIC
Bilirubin Urine: NEGATIVE
Glucose, UA: >=500 mg/dL — AB
Ketones, ur: NEGATIVE mg/dL
Nitrite: NEGATIVE
Protein, ur: >=300 mg/dL — AB
Specific Gravity, Urine: 1.014 (ref 1.005–1.030)
WBC, UA: >50 WBC/hpf — ABNORMAL HIGH (ref 0–5)
pH: 5 (ref 5.0–8.0)

## 2019-09-13 LAB — I-STAT CHEM 8, ED
BUN: 26 mg/dL — ABNORMAL HIGH (ref 6–20)
Calcium, Ion: 1.15 mmol/L (ref 1.15–1.40)
Chloride: 102 mmol/L (ref 98–111)
Creatinine, Ser: 2.7 mg/dL — ABNORMAL HIGH (ref 0.44–1.00)
Glucose, Bld: 291 mg/dL — ABNORMAL HIGH (ref 70–99)
HCT: 20 % — ABNORMAL LOW (ref 36.0–46.0)
Hemoglobin: 6.8 g/dL — CL (ref 12.0–15.0)
Potassium: 4.1 mmol/L (ref 3.5–5.1)
Sodium: 132 mmol/L — ABNORMAL LOW (ref 135–145)
TCO2: 18 mmol/L — ABNORMAL LOW (ref 22–32)

## 2019-09-13 LAB — IRON AND TIBC
Iron: <5 ug/dL — ABNORMAL LOW (ref 28–170)
TIBC: 158 ug/dL — ABNORMAL LOW (ref 250–450)

## 2019-09-13 LAB — PROTIME-INR
INR: 1.5 — ABNORMAL HIGH (ref 0.8–1.2)
Prothrombin Time: 17.2 seconds — ABNORMAL HIGH (ref 11.4–15.2)

## 2019-09-13 LAB — CBC WITH DIFFERENTIAL/PLATELET
Abs Immature Granulocytes: 0 10*3/uL (ref 0.00–0.07)
Basophils Absolute: 0.1 10*3/uL (ref 0.0–0.1)
Basophils Relative: 1 %
Eosinophils Absolute: 0 10*3/uL (ref 0.0–0.5)
Eosinophils Relative: 0 %
HCT: 23.9 % — ABNORMAL LOW (ref 36.0–46.0)
Hemoglobin: 7.3 g/dL — ABNORMAL LOW (ref 12.0–15.0)
Lymphocytes Relative: 9 %
Lymphs Abs: 1.3 10*3/uL (ref 0.7–4.0)
MCH: 24.7 pg — ABNORMAL LOW (ref 26.0–34.0)
MCHC: 30.5 g/dL (ref 30.0–36.0)
MCV: 81 fL (ref 80.0–100.0)
Monocytes Absolute: 0.3 10*3/uL (ref 0.1–1.0)
Monocytes Relative: 2 %
Neutro Abs: 12.8 10*3/uL — ABNORMAL HIGH (ref 1.7–7.7)
Neutrophils Relative %: 88 %
Platelets: 191 10*3/uL (ref 150–400)
RBC: 2.95 MIL/uL — ABNORMAL LOW (ref 3.87–5.11)
RDW: 17.2 % — ABNORMAL HIGH (ref 11.5–15.5)
WBC: 14.6 10*3/uL — ABNORMAL HIGH (ref 4.0–10.5)
nRBC: 0 % (ref 0.0–0.2)
nRBC: 0 /100 WBC

## 2019-09-13 LAB — COMPREHENSIVE METABOLIC PANEL
ALT: 13 U/L (ref 0–44)
AST: 15 U/L (ref 15–41)
Albumin: 1.7 g/dL — ABNORMAL LOW (ref 3.5–5.0)
Alkaline Phosphatase: 158 U/L — ABNORMAL HIGH (ref 38–126)
Anion gap: 10 (ref 5–15)
BUN: 27 mg/dL — ABNORMAL HIGH (ref 6–20)
CO2: 18 mmol/L — ABNORMAL LOW (ref 22–32)
Calcium: 7.7 mg/dL — ABNORMAL LOW (ref 8.9–10.3)
Chloride: 102 mmol/L (ref 98–111)
Creatinine, Ser: 2.59 mg/dL — ABNORMAL HIGH (ref 0.44–1.00)
GFR calc Af Amer: 26 mL/min — ABNORMAL LOW (ref >60)
GFR calc non Af Amer: 22 mL/min — ABNORMAL LOW (ref >60)
Glucose, Bld: 289 mg/dL — ABNORMAL HIGH (ref 70–99)
Potassium: 4 mmol/L (ref 3.5–5.1)
Sodium: 130 mmol/L — ABNORMAL LOW (ref 135–145)
Total Bilirubin: 0.4 mg/dL (ref 0.3–1.2)
Total Protein: 5.5 g/dL — ABNORMAL LOW (ref 6.5–8.1)

## 2019-09-13 LAB — SARS CORONAVIRUS 2 BY RT PCR (HOSPITAL ORDER, PERFORMED IN ~~LOC~~ HOSPITAL LAB): SARS Coronavirus 2: NEGATIVE

## 2019-09-13 LAB — CBG MONITORING, ED
Glucose-Capillary: 232 mg/dL — ABNORMAL HIGH (ref 70–99)
Glucose-Capillary: 222 mg/dL — ABNORMAL HIGH (ref 70–99)
Glucose-Capillary: 181 mg/dL — ABNORMAL HIGH (ref 70–99)

## 2019-09-13 LAB — APTT: aPTT: 30 seconds (ref 24–36)

## 2019-09-13 LAB — LACTIC ACID, PLASMA: Lactic Acid, Venous: 1.7 mmol/L (ref 0.5–1.9)

## 2019-09-13 LAB — HEMOGLOBIN A1C
Hgb A1c MFr Bld: 9 % — ABNORMAL HIGH (ref 4.8–5.6)
Mean Plasma Glucose: 211.6 mg/dL

## 2019-09-13 LAB — I-STAT BETA HCG BLOOD, ED (MC, WL, AP ONLY): I-stat hCG, quantitative: 5.3 m[IU]/mL — ABNORMAL HIGH (ref <5)

## 2019-09-13 MED ORDER — HYDRALAZINE HCL 25 MG PO TABS
25.0000 mg | ORAL_TABLET | Freq: Four times a day (QID) | ORAL | Status: DC | PRN
  Administered 2019-09-23: 25 mg via ORAL
  Filled 2019-09-13 (×3): qty 1

## 2019-09-13 MED ORDER — HYDROXYZINE HCL 25 MG PO TABS
50.0000 mg | ORAL_TABLET | Freq: Two times a day (BID) | ORAL | Status: DC | PRN
  Administered 2019-09-15 – 2019-09-19 (×3): 50 mg via ORAL
  Filled 2019-09-13 (×3): qty 2

## 2019-09-13 MED ORDER — VANCOMYCIN HCL 2000 MG/400ML IV SOLN
2000.0000 mg | Freq: Once | INTRAVENOUS | Status: AC
  Administered 2019-09-13: 2000 mg via INTRAVENOUS
  Filled 2019-09-13: qty 400

## 2019-09-13 MED ORDER — INSULIN ASPART 100 UNIT/ML ~~LOC~~ SOLN
0.0000 [IU] | Freq: Three times a day (TID) | SUBCUTANEOUS | Status: DC
  Administered 2019-09-13: 5 [IU] via SUBCUTANEOUS
  Administered 2019-09-15: 2 [IU] via SUBCUTANEOUS
  Administered 2019-09-18: 3 [IU] via SUBCUTANEOUS
  Administered 2019-09-19 (×2): 5 [IU] via SUBCUTANEOUS
  Administered 2019-09-19 – 2019-09-21 (×2): 3 [IU] via SUBCUTANEOUS
  Administered 2019-09-21 – 2019-09-22 (×2): 2 [IU] via SUBCUTANEOUS
  Administered 2019-09-22: 3 [IU] via SUBCUTANEOUS
  Administered 2019-09-22 – 2019-09-23 (×2): 5 [IU] via SUBCUTANEOUS
  Administered 2019-09-23: 3 [IU] via SUBCUTANEOUS
  Administered 2019-09-23: 8 [IU] via SUBCUTANEOUS
  Administered 2019-09-24: 5 [IU] via SUBCUTANEOUS

## 2019-09-13 MED ORDER — HEPARIN SODIUM (PORCINE) 5000 UNIT/ML IJ SOLN
5000.0000 [IU] | Freq: Two times a day (BID) | INTRAMUSCULAR | Status: DC
  Administered 2019-09-13 – 2019-09-14 (×3): 5000 [IU] via SUBCUTANEOUS
  Filled 2019-09-13 (×3): qty 1

## 2019-09-13 MED ORDER — BISMUTH SUBSALICYLATE 262 MG/15ML PO SUSP
30.0000 mL | Freq: Every day | ORAL | Status: DC | PRN
  Filled 2019-09-13: qty 236

## 2019-09-13 MED ORDER — PREGABALIN 100 MG PO CAPS: 200.0000 mg | ORAL_CAPSULE | Freq: Three times a day (TID) | ORAL | Status: DC

## 2019-09-13 MED ORDER — VANCOMYCIN HCL IN DEXTROSE 1-5 GM/200ML-% IV SOLN: 1000.0000 mg | Freq: Once | INTRAVENOUS | Status: DC

## 2019-09-13 MED ORDER — HYDROMORPHONE HCL 1 MG/ML IJ SOLN
0.5000 mg | Freq: Once | INTRAMUSCULAR | Status: AC
  Administered 2019-09-13: 0.5 mg via INTRAVENOUS
  Filled 2019-09-13: qty 1

## 2019-09-13 MED ORDER — PIPERACILLIN-TAZOBACTAM 3.375 G IVPB: 3.3750 g | Freq: Three times a day (TID) | INTRAVENOUS | Status: DC

## 2019-09-13 MED ORDER — VANCOMYCIN HCL 1250 MG/250ML IV SOLN
1250.0000 mg | INTRAVENOUS | Status: DC
  Administered 2019-09-14: 1250 mg via INTRAVENOUS
  Filled 2019-09-13 (×2): qty 250

## 2019-09-13 MED ORDER — ONDANSETRON HCL 4 MG/2ML IJ SOLN
4.0000 mg | Freq: Once | INTRAMUSCULAR | Status: AC
  Administered 2019-09-13: 4 mg via INTRAVENOUS
  Filled 2019-09-13: qty 2

## 2019-09-13 MED ORDER — DICLOFENAC SODIUM 1 % TD GEL
2.0000 g | Freq: Four times a day (QID) | TRANSDERMAL | Status: DC | PRN
  Filled 2019-09-13: qty 100

## 2019-09-13 MED ORDER — ROSUVASTATIN CALCIUM 20 MG PO TABS
20.0000 mg | ORAL_TABLET | Freq: Every day | ORAL | Status: DC
  Administered 2019-09-13 – 2019-09-15 (×3): 20 mg via ORAL
  Filled 2019-09-13 (×3): qty 1

## 2019-09-13 MED ORDER — INSULIN GLARGINE 100 UNIT/ML ~~LOC~~ SOLN
40.0000 [IU] | Freq: Two times a day (BID) | SUBCUTANEOUS | Status: DC
  Filled 2019-09-13: qty 0.4

## 2019-09-13 MED ORDER — LACTATED RINGERS IV BOLUS (SEPSIS)
1000.0000 mL | Freq: Once | INTRAVENOUS | Status: AC
  Administered 2019-09-13: 1000 mL via INTRAVENOUS

## 2019-09-13 MED ORDER — ALBUMIN HUMAN 25 % IV SOLN
25.0000 g | Freq: Four times a day (QID) | INTRAVENOUS | Status: DC
  Administered 2019-09-13 – 2019-09-14 (×6): 25 g via INTRAVENOUS
  Administered 2019-09-15: 12.5 g via INTRAVENOUS
  Filled 2019-09-13 (×8): qty 100

## 2019-09-13 MED ORDER — ACETAMINOPHEN 650 MG RE SUPP: 650.0000 mg | Freq: Four times a day (QID) | RECTAL | Status: DC | PRN

## 2019-09-13 MED ORDER — ACETAMINOPHEN 325 MG PO TABS
650.0000 mg | ORAL_TABLET | Freq: Once | ORAL | Status: AC
  Administered 2019-09-13: 650 mg via ORAL
  Filled 2019-09-13: qty 2

## 2019-09-13 MED ORDER — PIPERACILLIN-TAZOBACTAM 3.375 G IVPB
3.3750 g | Freq: Three times a day (TID) | INTRAVENOUS | Status: DC
  Administered 2019-09-13 – 2019-09-17 (×11): 3.375 g via INTRAVENOUS
  Filled 2019-09-13 (×10): qty 50

## 2019-09-13 MED ORDER — INSULIN GLARGINE 100 UNIT/ML ~~LOC~~ SOLN
40.0000 [IU] | Freq: Every day | SUBCUTANEOUS | Status: DC
  Administered 2019-09-13 – 2019-09-18 (×6): 40 [IU] via SUBCUTANEOUS
  Filled 2019-09-13 (×7): qty 0.4

## 2019-09-13 MED ORDER — ACETAMINOPHEN 325 MG PO TABS: 650.0000 mg | ORAL_TABLET | Freq: Four times a day (QID) | ORAL | Status: DC | PRN

## 2019-09-13 MED ORDER — PIPERACILLIN-TAZOBACTAM 3.375 G IVPB 30 MIN
3.3750 g | Freq: Once | INTRAVENOUS | Status: AC
  Administered 2019-09-13: 3.375 g via INTRAVENOUS
  Filled 2019-09-13: qty 50

## 2019-09-13 MED ORDER — INSULIN ASPART 100 UNIT/ML ~~LOC~~ SOLN
0.0000 [IU] | Freq: Every day | SUBCUTANEOUS | Status: DC
  Administered 2019-09-18: 2 [IU] via SUBCUTANEOUS

## 2019-09-13 MED ORDER — CLONAZEPAM 0.5 MG PO TABS
0.5000 mg | ORAL_TABLET | Freq: Two times a day (BID) | ORAL | Status: DC | PRN
  Administered 2019-09-14 – 2019-09-24 (×6): 0.5 mg via ORAL
  Filled 2019-09-13 (×7): qty 1

## 2019-09-13 MED ORDER — INSULIN ASPART 100 UNIT/ML ~~LOC~~ SOLN
8.0000 [IU] | Freq: Three times a day (TID) | SUBCUTANEOUS | Status: DC
  Administered 2019-09-13: 8 [IU] via SUBCUTANEOUS

## 2019-09-13 MED ORDER — ALBUTEROL SULFATE HFA 108 (90 BASE) MCG/ACT IN AERS
1.0000 | INHALATION_SPRAY | Freq: Four times a day (QID) | RESPIRATORY_TRACT | Status: DC | PRN
  Filled 2019-09-13: qty 6.7

## 2019-09-13 MED ORDER — SODIUM CHLORIDE 0.9 % IV SOLN: INTRAVENOUS | Status: DC

## 2019-09-13 MED ORDER — PREGABALIN 50 MG PO CAPS
150.0000 mg | ORAL_CAPSULE | Freq: Every day | ORAL | Status: DC
  Administered 2019-09-13 – 2019-09-20 (×8): 150 mg via ORAL
  Filled 2019-09-13: qty 3
  Filled 2019-09-13: qty 2
  Filled 2019-09-13: qty 1
  Filled 2019-09-13 (×2): qty 2
  Filled 2019-09-13 (×2): qty 3
  Filled 2019-09-13: qty 2

## 2019-09-13 MED ORDER — HYDROMORPHONE HCL 1 MG/ML IJ SOLN
0.5000 mg | INTRAMUSCULAR | Status: DC | PRN
  Administered 2019-09-14: 1 mg via INTRAVENOUS
  Filled 2019-09-13: qty 1

## 2019-09-13 MED ORDER — LACTATED RINGERS IV SOLN: INTRAVENOUS | Status: DC

## 2019-09-13 NOTE — ED Notes (Signed)
Called lab to add on labs

## 2019-09-13 NOTE — ED Provider Notes (Signed)
[facility] EMERGENCY DEPARTMENT Provider Note   CSN: [id number] Arrival date & time: 09/13/19  1151     History Chief Complaint  Patient presents with   Blood Infection   Recurrent Skin Infections    Felicia Reynolds is a 39 y.o. female.  Patient with history of diabetes, chronic back pain previously on Suboxone, diabetic neuropathy, bipolar disorder, anemia -- presents to the emergency department today for left foot swelling and fever.  Patient states that she stepped on a piece of glass from a mirror about 2 months ago.  She had a wound on her foot which she treated at home with Silvadene.  She did not seek medical attention.  Approximately 1-1/2 weeks ago she developed worsening pain, swelling, drainage, foul-smelling discharge from the left foot.  She presents today with fever and worsening symptoms.  She reports being out of insulin for the past 1 week.  She states that she last took Suboxone about 5 days ago because her prescription was not refilled.  No fever, cough.  No known sick contacts.  Patient states that she has received her coronavirus vaccines.  No vomiting or diarrhea.  She reports decreased urination and generalized weakness.  Transported to the emergency department today by EMS.  Patient given fluid bolus en route.  In regards to anemia, patient states that she has been on iron in the past.  She has never required a blood transfusion.  She takes ibuprofen rarely, not every day.  Denies alcohol use.  No history of peptic ulcer disease.  States that her stools are currently brown, not bloody or black.        Past Medical History:  Diagnosis Date   Anxiety    Arthritis    Bipolar disorder ([place])    Diabetes ([place]) 06/05/2015   Diabetes mellitus    Fibromyalgia    History of abnormal cervical Pap smear 05/29/2015   History of diabetes mellitus, type II 05/29/2015   Hypertension    Neuropathy    Obesity 05/29/2015   Osteoporosis     Ovarian cyst 02/20/2016   Ovarian cyst 02/20/2016   Simple cyst rt ovary   Panic attacks    Pneumonia    Stroke ([place])    Superficial fungus infection of skin 05/29/2015   Vitamin D deficiency 06/05/2015    Patient Active Problem List   Diagnosis Date Noted   Lobar pneumonia ([place]) 11/24/2018   Leukocytosis    Uncontrolled type 2 diabetes mellitus with peripheral neuropathy ([place]) 11/03/2018   Left sided numbness 01/29/2018   Hypertension 01/29/2018   Dysuria 08/25/2017   Acute pyelonephritis 11/30/2016   Hyponatremia 11/30/2016   Vulvovaginal candidiasis 11/30/2016   Urinary frequency 10/29/2016   Burning with urination 10/29/2016   Urinary tract infection without hematuria 10/29/2016   Recurrent acute serous otitis media of right ear 10/29/2016   Menorrhagia with irregular cycle 10/29/2016   Personal history of noncompliance with medical treatment, presenting hazards to health 05/05/2016   Essential hypertension, benign 05/05/2016   Ovarian cyst 02/20/2016   Uncontrolled diabetes mellitus with hyperglycemia ([place]) 06/05/2015   Vitamin D deficiency 06/05/2015   Superficial fungus infection of skin 05/29/2015   Morbid obesity ([place]) 05/29/2015   History of abnormal cervical Pap smear 05/29/2015    Past Surgical History:  Procedure Laterality Date   CESAREAN SECTION     HERNIA REPAIR     THROAT SURGERY       OB History    Gravida  6   Para  1   Term  0   Preterm  0   AB  5   Living  1     SAB  4   TAB  0   Ectopic  1   Multiple  0   Live Births              Family History  Problem Relation Age of Onset   Cancer Mother 52       breast   Breast cancer Mother    Breast cancer Maternal Aunt    Breast cancer Maternal Grandmother    Cancer Maternal Aunt     Social History   Tobacco Use   Smoking status: Current Some Day Smoker    Packs/day: 0.50    Years: 13.00    Pack years: 6.50    Types: Cigarettes    Smokeless tobacco: Never Used  Scientific laboratory technician Use: Never used  Substance Use Topics   Alcohol use: No    Alcohol/week: 0.0 standard drinks   Drug use: No    Home Medications Prior to Admission medications   Medication Sig Start Date End Date Taking? Authorizing Provider  bismuth subsalicylate (PEPTO BISMOL) 262 MG/15ML suspension Take 30 mLs by mouth daily as needed for indigestion.   Yes [provider]  clonazePAM (KLONOPIN) 0.5 MG tablet Take 0.5 mg by mouth 2 (two) times daily as needed for anxiety. 08/13/19  Yes [provider]  cloNIDine (CATAPRES) 0.2 MG tablet Take 0.2 mg by mouth 2 (two) times daily.   Yes [provider]  diclofenac sodium (VOLTAREN) 1 % GEL Apply 2 g topically 4 (four) times daily as needed (pain).    Yes [provider]  furosemide (LASIX) 20 MG tablet Take 20 mg by mouth 2 (two) times daily as needed for edema. 04/21/19  Yes [provider]  hydrOXYzine (ATARAX/VISTARIL) 50 MG tablet Take 50 mg by mouth 2 (two) times daily as needed for anxiety. 06/09/19  Yes [provider]  insulin aspart (NOVOLOG) 100 UNIT/ML injection Inject 30 Units into the skin 3 (three) times daily with meals. Patient taking differently: Inject 2-30 Units into the skin 3 (three) times daily with meals. Per sliding scale: not provided 09/15/18  Yes Eure, Mertie Clause, MD  Insulin Glargine (LANTUS SOLOSTAR) 100 UNIT/ML Solostar Pen Inject 80 Units into the skin at bedtime. Patient taking differently: Inject 80 Units into the skin 2 (two) times daily. Inject -80 units under the skin twice daily. 09/15/18  Yes Florian Buff, MD  lisinopril-hydrochlorothiazide (ZESTORETIC) 20-12.5 MG tablet Take 1 tablet by mouth daily. 04/21/19  Yes [provider]  pregabalin (LYRICA) 200 MG capsule Take 200 mg by mouth 3 (three) times daily.    Yes [provider]  PROAIR HFA 108 (90 Base) MCG/ACT inhaler Inhale 1 puff into the lungs 4  (four) times daily as needed for wheezing or shortness of breath. 08/15/19  Yes [provider]  silver sulfADIAZINE (SILVADENE) 1 % cream APPLY TO AFFECTED AREA 2 OR 3 TIMES DAILY Patient taking differently: Apply 1 application topically 3 (three) times daily. APPLY TO AFFECTED AREA 2 OR 3 TIMES DAILY 03/30/19  Yes Florian Buff, MD  azithromycin (ZITHROMAX) 500 MG tablet Take 1 tablet (500 mg total) by mouth daily. Patient not taking: Reported on 06/11/2019 11/25/18   Orson Eva, MD  cefdinir (OMNICEF) 300 MG capsule Take 1 capsule (300 mg total) by mouth 2 (  two) times daily. Patient not taking: Reported on 09/13/2019 11/25/18   Orson Eva, MD  megestrol (MEGACE) 40 MG tablet Take 1 tablet (40 mg total) by mouth 3 (three) times daily as needed (regulate period). Patient not taking: Reported on 09/13/2019 12/05/18   Florian Buff, MD  NOVOLOG FLEXPEN 100 UNIT/ML FlexPen INJECT 30 UNITS SUBCUTANEOUSLY 3 TIMES A DAY WITH MEALS Patient not taking: Reported on 09/13/2019 09/07/19   Florian Buff, MD  rosuvastatin (CRESTOR) 20 MG tablet Take 20 mg by mouth at bedtime. 06/21/19   [provider]  SUBOXONE 8-2 MG FILM Place 2 Film under the tongue daily.  Patient not taking: Reported on 09/13/2019 11/17/18   [provider]  tiZANidine (ZANAFLEX) 4 MG capsule Take 4 mg by mouth 3 (three) times daily. Patient not taking: Reported on 09/13/2019    [provider]    Allergies    Hydrocodone and Latex  Review of Systems   Review of Systems  Constitutional: Positive for chills, fatigue and fever.  HENT: Negative for rhinorrhea and sore throat.   Eyes: Negative for redness.  Respiratory: Negative for cough and shortness of breath.   Cardiovascular: Negative for chest pain.  Gastrointestinal: Negative for abdominal pain, diarrhea, nausea and vomiting.  Endocrine: Positive for polydipsia and polyuria.  Genitourinary: Negative for dysuria, frequency, hematuria and urgency.   Musculoskeletal: Positive for myalgias.  Skin: Positive for color change and wound. Negative for rash.  Neurological: Positive for weakness. Negative for headaches.    Physical Exam Updated Vital Signs BP (!) 114/55 Comment: 2lpm   Pulse 95 Comment: 2lpm   Temp (!) 103.2 F (39.6 C) (Oral)    Resp 18 Comment: 2lpm   Ht 5' 6.5" (1.689 m)    Wt 110.2 kg    SpO2 99% Comment: 2lpm   BMI 38.63 kg/m   Physical Exam Vitals and nursing note reviewed.  Constitutional:      General: She is not in acute distress.    Appearance: She is well-developed.  HENT:     Head: Normocephalic and atraumatic.     Right Ear: External ear normal.     Left Ear: External ear normal.     Nose: Nose normal.     Mouth/Throat:     Mouth: Mucous membranes are dry.  Eyes:     Conjunctiva/sclera: Conjunctivae normal.  Cardiovascular:     Rate and Rhythm: Regular rhythm. Tachycardia present.     Heart sounds: No murmur heard.   Pulmonary:     Effort: No respiratory distress.     Breath sounds: No wheezing, rhonchi or rales.  Abdominal:     Palpations: Abdomen is soft.     Tenderness: There is no abdominal tenderness. There is no guarding or rebound.  Musculoskeletal:     Cervical back: Normal range of motion and neck supple.     Right lower leg: No edema.     Comments: L foot: Patient with 3 cm ulceration to the plantar aspect of the left great toe with significant erythema and warmth, probable fluctuant along the medial aspect of the toe extending onto the foot medially and dorsally.  There is scaling of the entire great toe.  Foul odor noted.  Skin:    General: Skin is warm and dry.     Findings: No rash.  Neurological:     General: No focal deficit present.     Mental Status: She is alert. Mental status is at baseline.  Motor: No weakness.  Psychiatric:        Mood and Affect: Mood normal.           ED Results / Procedures / Treatments   Labs (all labs ordered are listed, but only  abnormal results are displayed) Labs Reviewed  COMPREHENSIVE METABOLIC PANEL - Abnormal; Notable for the following components:      Result Value   Sodium 130 (*)    CO2 18 (*)    Glucose, Bld 289 (*)    BUN 27 (*)    Creatinine, Ser 2.59 (*)    Calcium 7.7 (*)    Total Protein 5.5 (*)    Albumin 1.7 (*)    Alkaline Phosphatase 158 (*)    GFR calc non Af Amer 22 (*)    GFR calc Af Amer 26 (*)    All other components within normal limits  CBC WITH DIFFERENTIAL/PLATELET - Abnormal; Notable for the following components:   WBC 14.6 (*)    RBC 2.95 (*)    Hemoglobin 7.3 (*)    HCT 23.9 (*)    MCH 24.7 (*)    RDW 17.2 (*)    Neutro Abs 12.8 (*)    All other components within normal limits  PROTIME-INR - Abnormal; Notable for the following components:   Prothrombin Time 17.2 (*)    INR 1.5 (*)    All other components within normal limits  I-STAT BETA HCG BLOOD, ED ([facility], [facility], [facility] ONLY) - Abnormal; Notable for the following components:   I-stat hCG, quantitative 5.3 (*)    All other components within normal limits  I-STAT CHEM 8, ED - Abnormal; Notable for the following components:   Sodium 132 (*)    BUN 26 (*)    Creatinine, Ser 2.70 (*)    Glucose, Bld 291 (*)    TCO2 18 (*)    Hemoglobin 6.8 (*)    HCT 20.0 (*)    All other components within normal limits  CBG MONITORING, ED - Abnormal; Notable for the following components:   Glucose-Capillary 232 (*)    All other components within normal limits  CULTURE, BLOOD (ROUTINE X 2)  CULTURE, BLOOD (ROUTINE X 2)  URINE CULTURE  SARS CORONAVIRUS 2 BY RT PCR (HOSPITAL ORDER, [place] [facility])  LACTIC ACID, PLASMA  APTT  URINALYSIS, ROUTINE W REFLEX MICROSCOPIC  TYPE AND SCREEN    EKG None  Radiology DG Chest 2 View  Result Date: 09/13/2019 CLINICAL DATA:  Sepsis EXAM: CHEST - 2 VIEW COMPARISON:  November 24, 2018 FINDINGS: Lungs are clear. Heart is borderline enlarged with pulmonary vascularity normal. No  adenopathy. No bone lesions. IMPRESSION: Lungs clear.  Heart borderline enlarged.  No adenopathy. Electronically Signed   By: Lowella Grip III M.D.   On: 09/13/2019 13:54   DG Foot Complete Left  Result Date: 09/13/2019 CLINICAL DATA:  Soft tissue infection EXAM: LEFT FOOT - COMPLETE 3+ VIEW COMPARISON:  September 05, 2019 FINDINGS: Frontal, oblique, and lateral views were obtained. There is extensive soft tissue air tracking through the first digit from the proximal first metatarsal to the distal aspect of the first digit. There is a focal area of apparent rarefaction along the distal aspect of the first distal phalanx along the medial and lateral compartments. There may be a degree of bony destruction in this region. No similar changes suggesting bony destruction elsewhere. There is a fracture along the lateral aspect of the proximal portion of the  first distal phalanx. No other fracture evident. No dislocation. No joint space narrowing. IMPRESSION: 1. Soft tissue air tracking along the first digit extending from the first distal phalanx to the proximal first metatarsal consistent with soft tissue infection. 2. Concern for bony destruction along the distal aspect of the first proximal phalanx. 3. Fracture along the lateral aspect of the proximal portion of the first distal phalanx. Electronically Signed   By: Lowella Grip III M.D.   On: 09/13/2019 13:53    Procedures Procedures (including critical care time)  Medications Ordered in ED Medications  lactated ringers infusion ( Intravenous New Bag/Given 09/13/19 1254)  lactated ringers bolus 1,000 mL (1,000 mLs Intravenous New Bag/Given 09/13/19 1251)  vancomycin (VANCOREADY) IVPB 2000 mg/400 mL (has no administration in time range)  vancomycin (VANCOREADY) IVPB 1250 mg/250 mL (has no administration in time range)  piperacillin-tazobactam (ZOSYN) IVPB 3.375 g (3.375 g Intravenous New Bag/Given 09/13/19 1320)  piperacillin-tazobactam (ZOSYN) IVPB  3.375 g (has no administration in time range)  acetaminophen (TYLENOL) tablet 650 mg (650 mg Oral Given 09/13/19 1257)  HYDROmorphone (DILAUDID) injection 0.5 mg (0.5 mg Intravenous Given 09/13/19 1257)  ondansetron (ZOFRAN) injection 4 mg (4 mg Intravenous Given 09/13/19 1258)    ED Course  I have reviewed the triage vital signs and the nursing notes.  Pertinent labs & imaging results that were available during my care of the patient were reviewed by me and considered in my medical decision making (see chart for details).  Patient seen and examined. Sepsis work-up initiated.  Fluids ordered.  IV antibiotics ordered.  Vital signs reviewed and are as follows: BP (!) 114/55 Comment: 2lpm   Pulse 95 Comment: 2lpm   Temp (!) 103.2 F (39.6 C) (Oral)    Resp 18 Comment: 2lpm   Ht 5' 6.5" (1.689 m)    Wt 110.2 kg    SpO2 99% Comment: 2lpm   BMI 38.63 kg/m   Labs demonstrate anemia of 7.3.  Patient denies active bleed GI bleeding symptoms.  I have spoken with Orion Crook, PA-C of orthopedics.  He will see patient in the emergency department.  Agrees with hospitalist admission.  2:34 PM Ortho has seen.  Dr. Sharol Given to evaluate patient at later time.  Will likely need operative management.  AKI noted.  Do not suspect DKA with normal anion gap and bicarb of 18.  X-ray with soft tissue gas and suspicion for osteomyelitis.  Will admit to hospitalist service.  2:39 PM Patient discussed with Dr. Roosevelt Locks who will see patient.    Clinical Course as of Sep 12 1345  Thu Sep 09, 52  2642 39 year old poorly controlled diabetic here with left foot swelling and foul discharge.  Went to PCP and found to be febrile tachycardic.  She is likely septic.  Getting imaging of her foot cultures antibiotics orthopedic consult and will need admission to the hospital.   [MB]    Clinical Course User Index [MB] Hayden Rasmussen, MD   CRITICAL CARE Performed by: Carlisle Cater PA-C Total critical care time: 45  minutes Critical care time was exclusive of separately billable procedures and treating other patients. Critical care was necessary to treat or prevent imminent or life-threatening deterioration. Critical care was time spent personally by me on the following activities: development of treatment plan with patient and/or surrogate as well as nursing, discussions with consultants, evaluation of patient's response to treatment, examination of patient, obtaining history from patient or surrogate, ordering and performing treatments and interventions,  ordering and review of laboratory studies, ordering and review of radiographic studies, pulse oximetry and re-evaluation of patient's condition.   MDM Rules/Calculators/A&P                          Admit.    Final Clinical Impression(s) / ED Diagnoses Final diagnoses:  Sepsis with acute renal failure without septic shock, due to unspecified organism, unspecified acute renal failure type ([place])  Acute kidney injury ([place])  Anemia, unspecified type  Osteomyelitis of toe of left foot ([place])  Skin ulcer of toe of left foot with fat layer exposed [facility])    Rx / [place] Orders ED Discharge Orders    None       Carlisle Cater, PA-C 09/13/19 1440    Hayden Rasmussen, MD 09/13/19 1825

## 2019-09-13 NOTE — ED Notes (Signed)
Patient transported to Ultrasound 

## 2019-09-13 NOTE — ED Notes (Signed)
Pt's CBG result was 222. Informed Elizabeth - RN.

## 2019-09-13 NOTE — ED Notes (Signed)
Pt on purewick and aware of the need for urine

## 2019-09-13 NOTE — ED Notes (Signed)
Patient transported to X-ray 

## 2019-09-13 NOTE — Consult Note (Addendum)
Reason for Consult:Foot ulcer Referring Physician: Khalise Billard is an 39 y.o. female.  HPI: Felicia Reynolds cut her left big toe on a piece of broken mirror about 2 months ago. It was slow to heal but was doing ok until about 2 weeks ago when it began to swell, turn red and black, and cause significant pain. She has been treating at home with H2O2, alcohol wipes, and S&S cream to no avail. She saw her MD today who sent her to ED for evaluation. She is diabetic but has been out of insulin for about 2 weeks.  Past Medical History:  Diagnosis Date  . Anxiety   . Arthritis   . Bipolar disorder ([place])   . Diabetes ([place]) 06/05/2015  . Diabetes mellitus   . Fibromyalgia   . History of abnormal cervical Pap smear 05/29/2015  . History of diabetes mellitus, type II 05/29/2015  . Hypertension   . Neuropathy   . Obesity 05/29/2015  . Osteoporosis   . Ovarian cyst 02/20/2016  . Ovarian cyst 02/20/2016   Simple cyst rt ovary  . Panic attacks   . Pneumonia   . Stroke ([place])   . Superficial fungus infection of skin 05/29/2015  . Vitamin D deficiency 06/05/2015    Past Surgical History:  Procedure Laterality Date  . CESAREAN SECTION    . HERNIA REPAIR    . THROAT SURGERY      Family History  Problem Relation Age of Onset  . Cancer Mother 38       breast  . Breast cancer Mother   . Breast cancer Maternal Aunt   . Breast cancer Maternal Grandmother   . Cancer Maternal Aunt     Social History:  reports that she has been smoking cigarettes. She has a 6.50 pack-year smoking history. She has never used smokeless tobacco. She reports that she does not drink alcohol and does not use drugs.  Allergies:  Allergies  Allergen Reactions  . Hydrocodone Itching  . Latex Itching and Rash    Medications: I have reviewed the patient's current medications.  Results for orders placed or performed during the hospital encounter of 09/13/19 (from the past 48 hour(s))  Lactic acid, plasma     Status: None    Collection Time: 09/13/19 12:04 PM  Result Value Ref Range   Lactic Acid, Venous 1.7 0.5 - 1.9 mmol/L    Comment: Performed at [facility], [address], [place], [place] [address]  Comprehensive metabolic panel     Status: Abnormal   Collection Time: 09/13/19 12:17 PM  Result Value Ref Range   Sodium 130 (L) 135 - 145 mmol/L   Potassium 4.0 3.5 - 5.1 mmol/L   Chloride 102 98 - 111 mmol/L   CO2 18 (L) 22 - 32 mmol/L   Glucose, Bld 289 (H) 70 - 99 mg/dL    Comment: Glucose reference range applies only to samples taken after fasting for at least 8 hours.   BUN 27 (H) 6 - 20 mg/dL   Creatinine, Ser 2.59 (H) 0.44 - 1.00 mg/dL   Calcium 7.7 (L) 8.9 - 10.3 mg/dL   Total Protein 5.5 (L) 6.5 - 8.1 g/dL   Albumin 1.7 (L) 3.5 - 5.0 g/dL   AST 15 15 - 41 U/L   ALT 13 0 - 44 U/L   Alkaline Phosphatase 158 (H) 38 - 126 U/L   Total Bilirubin 0.4 0.3 - 1.2 mg/dL   GFR calc non  Af Amer 22 (L) >60 mL/min   GFR calc Af Amer 26 (L) >60 mL/min   Anion gap 10 5 - 15    Comment: Performed at [facility] [address] [place], [place] [address]  CBC with Differential     Status: Abnormal   Collection Time: 09/13/19 12:17 PM  Result Value Ref Range   WBC 14.6 (H) 4.0 - 10.5 K/uL   RBC 2.95 (L) 3.87 - 5.11 MIL/uL   Hemoglobin 7.3 (L) 12.0 - 15.0 g/dL   HCT 23.9 (L) 36 - 46 %   MCV 81.0 80.0 - 100.0 fL   MCH 24.7 (L) 26.0 - 34.0 pg   MCHC 30.5 30.0 - 36.0 g/dL   RDW 17.2 (H) 11.5 - 15.5 %   Platelets 191 150 - 400 K/uL   nRBC 0.0 0.0 - 0.2 %   Neutrophils Relative % 88 %   Neutro Abs 12.8 (H) 1.7 - 7.7 K/uL   Lymphocytes Relative 9 %   Lymphs Abs 1.3 0.7 - 4.0 K/uL   Monocytes Relative 2 %   Monocytes Absolute 0.3 0 - 1 K/uL   Eosinophils Relative 0 %   Eosinophils Absolute 0.0 0 - 0 K/uL   Basophils Relative 1 %   Basophils Absolute 0.1 0 - 0 K/uL   WBC Morphology See Note     Comment: Increased Bands. >20% Bands   nRBC 0 0 /100 WBC   Abs Immature Granulocytes 0.00  0.00 - 0.07 K/uL    Comment: Performed at [facility], [place] [address], [place], [place] [address]  Protime-INR     Status: Abnormal   Collection Time: 09/13/19 12:17 PM  Result Value Ref Range   Prothrombin Time 17.2 (H) 11.4 - 15.2 seconds   INR 1.5 (H) 0.8 - 1.2    Comment: (NOTE) INR goal varies based on device and disease states. Performed at [facility], [place] [address] [place], [place] [address]   APTT     Status: None   Collection Time: 09/13/19 12:17 PM  Result Value Ref Range   aPTT 30 24 - 36 seconds    Comment: Performed at [facility] [address] [place], [place] [address]  I-Stat beta hCG blood, ED     Status: Abnormal   Collection Time: 09/13/19 12:25 PM  Result Value Ref Range   I-stat hCG, quantitative 5.3 (H) <5 mIU/mL   Comment 3            Comment:   GEST. AGE      CONC.  (mIU/mL)   <=1 WEEK        5 - 50     2 WEEKS       50 - 500     3 WEEKS       100 - 10,000     4 WEEKS     1,000 - 30,000        FEMALE AND NON-PREGNANT FEMALE:     LESS THAN 5 mIU/mL   CBG monitoring, ED     Status: Abnormal   Collection Time: 09/13/19 12:25 PM  Result Value Ref Range   Glucose-Capillary 232 (H) 70 - 99 mg/dL    Comment: Glucose reference range applies only to samples taken after fasting for at least 8 hours.  I-stat chem 8, ED (not at [facility] or [facility])     Status: Abnormal   Collection Time: 09/13/19 12:28 PM  Result  Value Ref Range   Sodium 132 (L) 135 - 145 mmol/L   Potassium 4.1 3.5 - 5.1 mmol/L   Chloride 102 98 - 111 mmol/L   BUN 26 (H) 6 - 20 mg/dL   Creatinine, Ser 2.70 (H) 0.44 - 1.00 mg/dL   Glucose, Bld 291 (H) 70 - 99 mg/dL    Comment: Glucose reference range applies only to samples taken after fasting for at least 8 hours.   Calcium, Ion 1.15 1.15 - 1.40 mmol/L   TCO2 18 (L) 22 - 32 mmol/L   Hemoglobin 6.8 (LL) 12.0 - 15.0 g/dL   HCT 20.0 (L) 36 - 46 %  Type and screen     Status: None (Preliminary result)   Collection Time:  09/13/19  1:10 PM  Result Value Ref Range   ABO/RH(D) PENDING    Antibody Screen PENDING    Sample Expiration      09/16/2019,2359 Performed at [facility], [place] [address], [place], [place] [address]     DG Chest 2 View  Result Date: 09/13/2019 CLINICAL DATA:  Sepsis EXAM: CHEST - 2 VIEW COMPARISON:  November 24, 2018 FINDINGS: Lungs are clear. Heart is borderline enlarged with pulmonary vascularity normal. No adenopathy. No bone lesions. IMPRESSION: Lungs clear.  Heart borderline enlarged.  No adenopathy. Electronically Signed   By: Lowella Grip III M.D.   On: 09/13/2019 13:54   DG Foot Complete Left  Result Date: 09/13/2019 CLINICAL DATA:  Soft tissue infection EXAM: LEFT FOOT - COMPLETE 3+ VIEW COMPARISON:  September 05, 2019 FINDINGS: Frontal, oblique, and lateral views were obtained. There is extensive soft tissue air tracking through the first digit from the proximal first metatarsal to the distal aspect of the first digit. There is a focal area of apparent rarefaction along the distal aspect of the first distal phalanx along the medial and lateral compartments. There may be a degree of bony destruction in this region. No similar changes suggesting bony destruction elsewhere. There is a fracture along the lateral aspect of the proximal portion of the first distal phalanx. No other fracture evident. No dislocation. No joint space narrowing. IMPRESSION: 1. Soft tissue air tracking along the first digit extending from the first distal phalanx to the proximal first metatarsal consistent with soft tissue infection. 2. Concern for bony destruction along the distal aspect of the first proximal phalanx. 3. Fracture along the lateral aspect of the proximal portion of the first distal phalanx. Electronically Signed   By: Lowella Grip III M.D.   On: 09/13/2019 13:53    Review of Systems  Constitutional: Positive for fever. Negative for chills and diaphoresis.  HENT: Negative for ear  discharge, ear pain, hearing loss and tinnitus.   Eyes: Negative for photophobia and pain.  Respiratory: Negative for cough and shortness of breath.   Cardiovascular: Negative for chest pain.  Gastrointestinal: Negative for abdominal pain, nausea and vomiting.  Genitourinary: Negative for dysuria, flank pain, frequency and urgency.  Musculoskeletal: Positive for arthralgias (Left foot). Negative for back pain, myalgias and neck pain.  Neurological: Negative for dizziness and headaches.  Hematological: Does not bruise/bleed easily.  Psychiatric/Behavioral: The patient is not nervous/anxious.    Blood pressure (!) 114/55, pulse 95, temperature (!) 103.2 F (39.6 C), temperature source Oral, resp. rate 18, height 5' 6.5" (1.689 m), weight 110.2 kg, SpO2 99 %. Physical Exam Constitutional:      General: She is not in acute distress.    Appearance: She is well-developed. She  is not diaphoretic.  HENT:     Head: Normocephalic and atraumatic.  Eyes:     General: No scleral icterus.       Right eye: No discharge.        Left eye: No discharge.     Conjunctiva/sclera: Conjunctivae normal.  Cardiovascular:     Rate and Rhythm: Normal rate and regular rhythm.  Pulmonary:     Effort: Pulmonary effort is normal. No respiratory distress.  Musculoskeletal:     Cervical back: Normal range of motion.  Feet:     Comments: Left foot -- Hallux ulceration medial plantar surface with necrosis and foul odor. EHL intact. DPN absent, TN/SPN intact, foot with 3+ NP edema and erythema on dorsal forefoot. DP/PT pulses present but extremely faint. Skin:    General: Skin is warm and dry.  Neurological:     Mental Status: She is alert.  Psychiatric:        Behavior: Behavior normal.     Assessment/Plan: Left toe ulcer with osteo -- Will need amputation. Will get ABI's in meantime. May need vascular consult prior to surgery. Dr. Stann Mainland to evaluate but Dr. Sharol Given will likely perform amputation, earliest next  Wednesday. If she is stable for discharge before then she could discharge on PO suppressive antibiotics and see him in the office. Multiple medical problems including DM, bipolar d/o, and Suboxone use -- Internal medicine to admit and manage. Appreciate their help.    Lisette Abu, PA-C Orthopedic Surgery [phone] 09/13/2019, 2:04 PM

## 2019-09-13 NOTE — ED Notes (Signed)
Lab called and requested a lav tube as BB took the one sent earlier.

## 2019-09-13 NOTE — H&P (Signed)
History and Physical    Felicia Reynolds BJY:[id number] DOB: [birth year] DOA: 09/13/2019  [facility]: [facility] (Confirm with patient/family/NH records and if not entered, this has to be entered at [facility] point of entry) Patient coming from: Home  I have personally briefly reviewed patient's old medical records in [place]  Chief Complaint: Left foot hurts  HPI: Felicia Reynolds is a 39 y.o. female with medical history significant of IDDM with insulin resistance, diabetic neuropathy, hypertension, CKD stage II, chronic iron deficiency anemia secondary to menorrhagia, Suboxone therapy, presented with worsening of left toe/foot infection.  10 days ago, patient stepped on piece of glass which cut open her left toe and distal left foot, she did not notice until few hours later because of the diabetic neuropathy.  2 days later she started to feel pain which gradually getting worse, she went to her PCP and received a few days of p.o. Augmentin.  For the last week, she developed worsening of pain, today pain is 10/10, constant, and swelling and purulent discharge with foul smell.  Also had subjective fever and chills at home.  Patient also reported she has separated with her spouse and has been living in the garage since last week.  Meanwhile she ran out of her insulin and Suboxone for about 7 days.  She has not been able to stand up and put weight on the left foot because of the severe pain, and she reported hard time to transfer herself out of bed. ED Course: Patient was found to be tachycardia, blood pressure borderline low, fever of 103.  WBC 14.6, worsening of creatinine level from baseline 1.6 to now 2.59.  Left foot x-ray showed soft tissue air tracking along the fourth digit extending from first distal phalanx to proximal fourth metatarsal consistent with soft tissue infection concern for bony destruction of the first proximal phalanx and fracture of the first distal phalanx.  Review of  Systems: As per HPI otherwise 14 point review of systems negative.    Past Medical History:  Diagnosis Date  . Anxiety   . Arthritis   . Bipolar disorder ([place])   . Diabetes ([place]) 06/05/2015  . Diabetes mellitus   . Fibromyalgia   . History of abnormal cervical Pap smear 05/29/2015  . History of diabetes mellitus, type II 05/29/2015  . Hypertension   . Neuropathy   . Obesity 05/29/2015  . Osteoporosis   . Ovarian cyst 02/20/2016  . Ovarian cyst 02/20/2016   Simple cyst rt ovary  . Panic attacks   . Pneumonia   . Stroke ([place])   . Superficial fungus infection of skin 05/29/2015  . Vitamin D deficiency 06/05/2015    Past Surgical History:  Procedure Laterality Date  . CESAREAN SECTION    . HERNIA REPAIR    . THROAT SURGERY       reports that she has been smoking cigarettes. She has a 6.50 pack-year smoking history. She has never used smokeless tobacco. She reports that she does not drink alcohol and does not use drugs.  Allergies  Allergen Reactions  . Hydrocodone Itching  . Latex Itching and Rash    Family History  Problem Relation Age of Onset  . Cancer Mother 67       breast  . Breast cancer Mother   . Breast cancer Maternal Aunt   . Breast cancer Maternal Grandmother   . Cancer Maternal Aunt      Prior to Admission medications   Medication  Sig Start Date End Date Taking? Authorizing Provider  bismuth subsalicylate (PEPTO BISMOL) 262 MG/15ML suspension Take 30 mLs by mouth daily as needed for indigestion.   Yes [provider]  clonazePAM (KLONOPIN) 0.5 MG tablet Take 0.5 mg by mouth 2 (two) times daily as needed for anxiety. 08/13/19  Yes [provider]  cloNIDine (CATAPRES) 0.2 MG tablet Take 0.2 mg by mouth 2 (two) times daily.   Yes [provider]  diclofenac sodium (VOLTAREN) 1 % GEL Apply 2 g topically 4 (four) times daily as needed (pain).    Yes [provider]  furosemide (LASIX) 20 MG tablet Take 20 mg by mouth 2 (two)  times daily as needed for edema. 04/21/19  Yes [provider]  hydrOXYzine (ATARAX/VISTARIL) 50 MG tablet Take 50 mg by mouth 2 (two) times daily as needed for anxiety. 06/09/19  Yes [provider]  insulin aspart (NOVOLOG) 100 UNIT/ML injection Inject 30 Units into the skin 3 (three) times daily with meals. Patient taking differently: Inject 2-30 Units into the skin 3 (three) times daily with meals. Per sliding scale: not provided 09/15/18  Yes Eure, Mertie Clause, MD  Insulin Glargine (LANTUS SOLOSTAR) 100 UNIT/ML Solostar Pen Inject 80 Units into the skin at bedtime. Patient taking differently: Inject 80 Units into the skin 2 (two) times daily. Inject -80 units under the skin twice daily. 09/15/18  Yes Florian Buff, MD  lisinopril-hydrochlorothiazide (ZESTORETIC) 20-12.5 MG tablet Take 1 tablet by mouth daily. 04/21/19  Yes [provider]  pregabalin (LYRICA) 200 MG capsule Take 200 mg by mouth 3 (three) times daily.    Yes [provider]  PROAIR HFA 108 (90 Base) MCG/ACT inhaler Inhale 1 puff into the lungs 4 (four) times daily as needed for wheezing or shortness of breath. 08/15/19  Yes [provider]  silver sulfADIAZINE (SILVADENE) 1 % cream APPLY TO AFFECTED AREA 2 OR 3 TIMES DAILY Patient taking differently: Apply 1 application topically 3 (three) times daily. APPLY TO AFFECTED AREA 2 OR 3 TIMES DAILY 03/30/19  Yes Florian Buff, MD  azithromycin (ZITHROMAX) 500 MG tablet Take 1 tablet (500 mg total) by mouth daily. Patient not taking: Reported on 06/11/2019 11/25/18   Orson Eva, MD  cefdinir (OMNICEF) 300 MG capsule Take 1 capsule (300 mg total) by mouth 2 (two) times daily. Patient not taking: Reported on 09/13/2019 11/25/18   Orson Eva, MD  megestrol (MEGACE) 40 MG tablet Take 1 tablet (40 mg total) by mouth 3 (three) times daily as needed (regulate period). Patient not taking: Reported on 09/13/2019 12/05/18   Florian Buff, MD  NOVOLOG FLEXPEN 100  UNIT/ML FlexPen INJECT 30 UNITS SUBCUTANEOUSLY 3 TIMES A DAY WITH MEALS Patient not taking: Reported on 09/13/2019 09/07/19   Florian Buff, MD  rosuvastatin (CRESTOR) 20 MG tablet Take 20 mg by mouth at bedtime. 06/21/19   [provider]  SUBOXONE 8-2 MG FILM Place 2 Film under the tongue daily.  Patient not taking: Reported on 09/13/2019 11/17/18   [provider]  tiZANidine (ZANAFLEX) 4 MG capsule Take 4 mg by mouth 3 (three) times daily. Patient not taking: Reported on 09/13/2019    [provider]    Physical Exam: Vitals:   09/13/19 1200 09/13/19 1215 09/13/19 1230 09/13/19 1245  BP: (!) 130/58 118/61 (!) 116/51 (!) 114/55  Pulse: (!) 105 (!) 102 97 95  Resp: 20 20 (!) 21 18  Temp:  TempSrc:      SpO2: 94% 98% 96% 99%  Weight:      Height:        Constitutional: NAD, calm, comfortable Vitals:   09/13/19 1200 09/13/19 1215 09/13/19 1230 09/13/19 1245  BP: (!) 130/58 118/61 (!) 116/51 (!) 114/55  Pulse: (!) 105 (!) 102 97 95  Resp: 20 20 (!) 21 18  Temp:      TempSrc:      SpO2: 94% 98% 96% 99%  Weight:      Height:       Eyes: PERRL, lids and conjunctivae normal ENMT: Mucous membranes are dry. Posterior pharynx clear of any exudate or lesions.Normal dentition.  Neck: normal, supple, no masses, no thyromegaly Respiratory: clear to auscultation bilaterally, no wheezing, no crackles. Normal respiratory effort. No accessory muscle use.  Cardiovascular: Regular rate and rhythm, no murmurs / rubs / gallops. No extremity edema. 2+ pedal pulses. No carotid bruits.  Abdomen: no tenderness, no masses palpated. No hepatosplenomegaly. Bowel sounds positive.  Musculoskeletal: no clubbing / cyanosis. No joint deformity upper and lower extremities. Good ROM, no contractures. Normal muscle tone.  Skin: Left distal foot and toe large ulcer with swelling and pus and underneath gass Neurologic: CN 2-12 grossly intact. Sensation intact, DTR normal. Strength 5/5 in  all 4.  Psychiatric: Normal judgment and insight. Alert and oriented x 3. Normal mood.       Labs on Admission: I have personally reviewed following labs and imaging studies  CBC: Recent Labs  Lab 09/13/19 1217 09/13/19 1228  WBC 14.6*  --   NEUTROABS 12.8*  --   HGB 7.3* 6.8*  HCT 23.9* 20.0*  MCV 81.0  --   PLT 191  --    Basic Metabolic Panel: Recent Labs  Lab 09/13/19 1217 09/13/19 1228  NA 130* 132*  K 4.0 4.1  CL 102 102  CO2 18*  --   GLUCOSE 289* 291*  BUN 27* 26*  CREATININE 2.59* 2.70*  CALCIUM 7.7*  --    GFR: Estimated Creatinine Clearance: 34.8 mL/min (A) (by C-G formula based on SCr of 2.7 mg/dL (H)). Liver Function Tests: Recent Labs  Lab 09/13/19 1217  AST 15  ALT 13  ALKPHOS 158*  BILITOT 0.4  PROT 5.5*  ALBUMIN 1.7*   No results for input(s): LIPASE, AMYLASE in the last 168 hours. No results for input(s): AMMONIA in the last 168 hours. Coagulation Profile: Recent Labs  Lab 09/13/19 1217  INR 1.5*   Cardiac Enzymes: No results for input(s): CKTOTAL, CKMB, CKMBINDEX, TROPONINI in the last 168 hours. BNP (last 3 results) No results for input(s): PROBNP in the last 8760 hours. HbA1C: No results for input(s): HGBA1C in the last 72 hours. CBG: Recent Labs  Lab 09/13/19 1225  GLUCAP 232*   Lipid Profile: No results for input(s): CHOL, HDL, LDLCALC, TRIG, CHOLHDL, LDLDIRECT in the last 72 hours. Thyroid Function Tests: No results for input(s): TSH, T4TOTAL, FREET4, T3FREE, THYROIDAB in the last 72 hours. Anemia Panel: No results for input(s): VITAMINB12, FOLATE, FERRITIN, TIBC, IRON, RETICCTPCT in the last 72 hours. Urine analysis:    Component Value Date/Time   COLORURINE YELLOW 11/24/2018 0245   APPEARANCEUR CLOUDY (A) 11/24/2018 0245   APPEARANCEUR Cloudy (A) 12/20/2017 1138   LABSPEC >1.046 (H) 11/24/2018 0245   PHURINE 6.0 11/24/2018 0245   GLUCOSEU 150 (A) 11/24/2018 0245   HGBUR MODERATE (A) 11/24/2018 0245    BILIRUBINUR NEGATIVE 11/24/2018 0245   BILIRUBINUR Negative 12/20/2017 1138  KETONESUR NEGATIVE 11/24/2018 0245   PROTEINUR >=300 (A) 11/24/2018 0245   UROBILINOGEN 0.2 11/28/2011 0642   NITRITE NEGATIVE 11/24/2018 0245   LEUKOCYTESUR NEGATIVE 11/24/2018 0245    Radiological Exams on Admission: DG Chest 2 View  Result Date: 09/13/2019 CLINICAL DATA:  Sepsis EXAM: CHEST - 2 VIEW COMPARISON:  November 24, 2018 FINDINGS: Lungs are clear. Heart is borderline enlarged with pulmonary vascularity normal. No adenopathy. No bone lesions. IMPRESSION: Lungs clear.  Heart borderline enlarged.  No adenopathy. Electronically Signed   By: Lowella Grip III M.D.   On: 09/13/2019 13:54   DG Foot Complete Left  Result Date: 09/13/2019 CLINICAL DATA:  Soft tissue infection EXAM: LEFT FOOT - COMPLETE 3+ VIEW COMPARISON:  September 05, 2019 FINDINGS: Frontal, oblique, and lateral views were obtained. There is extensive soft tissue air tracking through the first digit from the proximal first metatarsal to the distal aspect of the first digit. There is a focal area of apparent rarefaction along the distal aspect of the first distal phalanx along the medial and lateral compartments. There may be a degree of bony destruction in this region. No similar changes suggesting bony destruction elsewhere. There is a fracture along the lateral aspect of the proximal portion of the first distal phalanx. No other fracture evident. No dislocation. No joint space narrowing. IMPRESSION: 1. Soft tissue air tracking along the first digit extending from the first distal phalanx to the proximal first metatarsal consistent with soft tissue infection. 2. Concern for bony destruction along the distal aspect of the first proximal phalanx. 3. Fracture along the lateral aspect of the proximal portion of the first distal phalanx. Electronically Signed   By: Lowella Grip III M.D.   On: 09/13/2019 13:53    EKG: Independently reviewed.  Sinus  tachycardia, early repolarization  Assessment/Plan Active Problems:   Osteomyelitis ([place])   Sepsis ([place])  (please populate well all problems here in Problem List. (For example, if patient is on BP meds at home and you resume or decide to hold them, it is a problem that needs to be her. Same for CAD, COPD, HLD and so on)  Sepsis -Evidenced by elevated white count, fever, source is left foot abscess, gas gangrene, and osteomyelitis.  With endorgan damage of AKI. -Vancomycin and Zosyn started -Podiatrist consultation appreciated, agreed with left first toe plus minus metatarsal amputation. -Hold home BP meds, start maintenance IV fluids -Hold PT for incoming surgery  AKI on CKD stage II -Secondary to sepsis, start maintenance IV fluids plus albumin every 6 hours x3 days -Suspect underlying diabetic nephropathy, UA pending -Indicating more strict glucose control BP control  Uncontrolled diabetes -Cut down long-acting insulin given AKI -Start standing dose of aspart 8 units 3 times daily AC, plus sliding scale and at bedtime coverage  Suboxone therapy -On narcotic for the left foot pain, restart Suboxone on discharge  Acute on chronic anemia secondary to chronic blood loss from menorrhagia -Check iron level, may need iron supplement -Probably has element of anemia secondary to CKD, check reticulocyte count -Megestrol as needed  HTN -As above  Morbid obesity -Outpatient PCP follow-up and diet modification  DVT prophylaxis: Heparin subcu Code Status: Full code Family Communication: None at bedside Disposition Plan: Likely will need at least 3 to 5 days hospital stay for treatment of osteomyelitis and amputation of left toe/forefoot Consults called: Orthopedic surgery Admission status: PCU   Lequita Halt MD [facility] Pager [phone]  09/13/2019, 3:28 PM

## 2019-09-13 NOTE — Progress Notes (Signed)
ABI's have been completed. Preliminary results can be found in CV Proc through chart review.   09/13/19 4:34 PM Carlos Levering RVT

## 2019-09-13 NOTE — ED Triage Notes (Signed)
Pt bib ems with gen weakness and fever x 1 wk. Has been out of insulinx1 wk. Pt given 1000mg  tylenol and 500 NS bolus. Pt has necrosis of left toe/foot. Hr 120's. rr 24 127/74 98% rr, 102 temp cbg 308 cal 34

## 2019-09-13 NOTE — ED Notes (Signed)
Felicia Reynolds - RN asked pt about providing a urine sample.

## 2019-09-13 NOTE — ED Notes (Signed)
Pt's CBG result was 181. Informed Elizabeth - RN.

## 2019-09-13 NOTE — ED Notes (Signed)
Ortho PA at bedside.  

## 2019-09-13 NOTE — ED Notes (Signed)
This RN called main pharmacy and asked if pt's albumin solution was ready for use. This RN was informed that the solution is prepared and will be brought down to ED.

## 2019-09-13 NOTE — ED Notes (Signed)
Notified EDP and attending that pt requests a social work consult.

## 2019-09-13 NOTE — Progress Notes (Signed)
Pharmacy Antibiotic Note  Felicia Reynolds is a 39 y.o. female admitted on 09/13/2019 with diabetic foot infection.  Pharmacy has been consulted for vancomycin and Zosyn dosing.  Plan: Vancomycin 2000 mg IV x 1, then 1250 mg IV every 24 hours Zosyn 3.375g IV every 8 hours (extended infusion) Monitor renal function, clinical progression and ability to narrow Vancomycin trough at steady state  Height: 5' 6.5" (168.9 cm) Weight: 110.2 kg (242 lb 15.2 oz) IBW/kg (Calculated) : 60.45  Temp (24hrs), Avg:103.2 F (39.6 C), Min:103.2 F (39.6 C), Max:103.2 F (39.6 C)  No results for input(s): WBC, CREATININE, LATICACIDVEN, VANCOTROUGH, VANCOPEAK, VANCORANDOM, GENTTROUGH, GENTPEAK, GENTRANDOM, TOBRATROUGH, TOBRAPEAK, TOBRARND, AMIKACINPEAK, AMIKACINTROU, AMIKACIN in the last 168 hours.  Estimated Creatinine Clearance: 56.3 mL/min (A) (by C-G formula based on SCr of 1.67 mg/dL (H)).    Allergies  Allergen Reactions  . Hydrocodone Itching  . Latex Itching and Rash    Bertis Ruddy, PharmD Clinical Pharmacist ED Pharmacist Phone # [phone] 09/13/2019 12:14 PM

## 2019-09-14 LAB — BASIC METABOLIC PANEL
Anion gap: 11 (ref 5–15)
BUN: 34 mg/dL — ABNORMAL HIGH (ref 6–20)
CO2: 19 mmol/L — ABNORMAL LOW (ref 22–32)
Calcium: 8.2 mg/dL — ABNORMAL LOW (ref 8.9–10.3)
Chloride: 103 mmol/L (ref 98–111)
Creatinine, Ser: 3.17 mg/dL — ABNORMAL HIGH (ref 0.44–1.00)
GFR calc Af Amer: 20 mL/min — ABNORMAL LOW (ref >60)
GFR calc non Af Amer: 17 mL/min — ABNORMAL LOW (ref >60)
Glucose, Bld: 101 mg/dL — ABNORMAL HIGH (ref 70–99)
Potassium: 4.4 mmol/L (ref 3.5–5.1)
Sodium: 133 mmol/L — ABNORMAL LOW (ref 135–145)

## 2019-09-14 LAB — CBC
HCT: 22.9 % — ABNORMAL LOW (ref 36.0–46.0)
Hemoglobin: 6.8 g/dL — CL (ref 12.0–15.0)
MCH: 23.9 pg — ABNORMAL LOW (ref 26.0–34.0)
MCHC: 29.7 g/dL — ABNORMAL LOW (ref 30.0–36.0)
MCV: 80.6 fL (ref 80.0–100.0)
Platelets: 172 10*3/uL (ref 150–400)
RBC: 2.84 MIL/uL — ABNORMAL LOW (ref 3.87–5.11)
RDW: 17.5 % — ABNORMAL HIGH (ref 11.5–15.5)
WBC: 8.7 10*3/uL (ref 4.0–10.5)
nRBC: 0 % (ref 0.0–0.2)

## 2019-09-14 LAB — URINE CULTURE

## 2019-09-14 LAB — GLUCOSE, CAPILLARY
Glucose-Capillary: 101 mg/dL — ABNORMAL HIGH (ref 70–99)
Glucose-Capillary: 130 mg/dL — ABNORMAL HIGH (ref 70–99)
Glucose-Capillary: 157 mg/dL — ABNORMAL HIGH (ref 70–99)
Glucose-Capillary: 152 mg/dL — ABNORMAL HIGH (ref 70–99)

## 2019-09-14 LAB — RETICULOCYTES
Immature Retic Fract: 1.6 % — ABNORMAL LOW (ref 2.3–15.9)
RBC.: 2.87 MIL/uL — ABNORMAL LOW (ref 3.87–5.11)
Retic Count, Absolute: 21.5 10*3/uL (ref 19.0–186.0)
Retic Ct Pct: 0.8 % (ref 0.4–3.1)

## 2019-09-14 LAB — PREPARE RBC (CROSSMATCH)

## 2019-09-14 LAB — HEMOGLOBIN AND HEMATOCRIT, BLOOD
HCT: 24 % — ABNORMAL LOW (ref 36.0–46.0)
Hemoglobin: 7.4 g/dL — ABNORMAL LOW (ref 12.0–15.0)

## 2019-09-14 MED ORDER — OXYCODONE-ACETAMINOPHEN 5-325 MG PO TABS
3.0000 | ORAL_TABLET | Freq: Three times a day (TID) | ORAL | Status: DC | PRN
  Administered 2019-09-14 – 2019-09-19 (×5): 3 via ORAL
  Filled 2019-09-14 (×6): qty 3

## 2019-09-14 MED ORDER — SODIUM CHLORIDE 0.9 % IV SOLN: INTRAVENOUS | Status: DC

## 2019-09-14 MED ORDER — SODIUM CHLORIDE 0.9% IV SOLUTION: Freq: Once | INTRAVENOUS | Status: AC

## 2019-09-14 MED ORDER — INSULIN ASPART 100 UNIT/ML ~~LOC~~ SOLN
4.0000 [IU] | Freq: Three times a day (TID) | SUBCUTANEOUS | Status: DC
  Administered 2019-09-15 – 2019-09-18 (×4): 4 [IU] via SUBCUTANEOUS

## 2019-09-14 MED ORDER — FERROUS SULFATE 325 (65 FE) MG PO TABS
325.0000 mg | ORAL_TABLET | Freq: Two times a day (BID) | ORAL | Status: DC
  Administered 2019-09-14 – 2019-09-24 (×14): 325 mg via ORAL
  Filled 2019-09-14 (×15): qty 1

## 2019-09-14 MED ORDER — BOOST / RESOURCE BREEZE PO LIQD CUSTOM
1.0000 | Freq: Three times a day (TID) | ORAL | Status: DC
  Administered 2019-09-14 – 2019-09-15 (×5): 1 via ORAL
  Administered 2019-09-15: 237 mL via ORAL
  Administered 2019-09-16: 1 via ORAL
  Administered 2019-09-16: 237 mL via ORAL
  Administered 2019-09-17 – 2019-09-22 (×8): 1 via ORAL

## 2019-09-14 NOTE — Progress Notes (Signed)
PROGRESS NOTE    Felicia Reynolds  QVZ:[id number] DOB: [birth year] DOA: 09/13/2019 PCP: [facility]     Brief Narrative:  Felicia Reynolds is a 39 y.o. female with medical history significant of IDDM with insulin resistance, diabetic neuropathy, hypertension, CKD stage III, chronic iron deficiency anemia secondary to menorrhagia, Suboxone therapy, presented with worsening of left toe/foot infection.  10 days ago, patient stepped on piece of mirror which cut open her left toe and distal left foot. She did not notice until few hours later because of the diabetic neuropathy.  2 days later she started to feel pain which gradually getting worse, she went to her PCP and received a few days of p.o. Augmentin.  For the last week, she developed worsening of pain, today pain is 10/10, constant, and swelling and purulent discharge with foul smell.  Also had subjective fever and chills at home.  Patient also reported she has separated with her spouse and has been living in the garage since last week.  Meanwhile she ran out of her insulin and Suboxone for about 7 days.  She has not been able to stand up and put weight on the left foot because of the severe pain, and she reported hard time to transfer herself out of bed. In the ED, patient was found to be tachycardia, blood pressure borderline low, fever of 103.  WBC 14.6, worsening of creatinine level from baseline 1.6 to now 2.59.  Left foot x-ray showed soft tissue air tracking along the fourth digit extending from first distal phalanx to proximal fourth metatarsal consistent with soft tissue infection concern for bony destruction of the first proximal phalanx and fracture of the first distal phalanx.  Orthopedic surgery was consulted.  New events last 24 hours / Subjective: Continues to have pain in her left toe.  Does not want to take Dilaudid, asking for p.o. medication instead.  She does admit to having her periods still, used to take iron  supplementation for iron deficiency anemia.  No report of blood loss in her stool.  She also would like to speak with social work for home issues, including possible need for DME.  Also wants boost supplement.  Assessment & Plan:   Active Problems:   Osteomyelitis (HCC)   Sepsis ([place])   Severe sepsis secondary to osteomyelitis of the left great toe and UTI, present on admission -Presented with fever 103.2, tachycardia, leukocytosis, endorgan damage of AKI -Foot x-ray revealed soft tissue air tracking along the first digit extending from the first distal phalanx to the proximal first metatarsal consistent with soft tissue infection. Concern for bony destruction along the distal aspect of the first proximal phalanx. Fracture along the lateral aspect of the proximal portion of the first distal phalanx. -Blood cultures pending -Urine culture pending -Continue vancomycin, Zosyn -Appreciate orthopedic surgery -ABI 0.94 on the left  AKI on CKD stage IIIb -Baseline creatinine 1.7 -Continue IV fluid -Avoid nephrotoxins, trend BMP  Acute on chronic iron deficiency anemia, chronic blood loss from menorrhagia -Transfuse 1 unit packed red blood cell monitor CBC -Ferrous sulfate -Trend CBC  Uncontrolled diabetes with hyperglycemia and neuropathy -Hemoglobin A1c 9.0 -Continue Lantus, mealtime NovoLog, sliding scale insulin  Hyperlipidemia -Continue Crestor  Morbid obesity Estimated body mass index is 46.65 kg/m as calculated from the following:   Height as of this encounter: 5' 6.5" (1.689 m).   Weight as of this encounter: 133.1 kg.      DVT prophylaxis:  heparin injection 5,000 Units  Start: 09/13/19 2200  Code Status: Full code Family Communication: No family at bedside Disposition Plan:   Status is: Inpatient  Remains inpatient appropriate because:Ongoing diagnostic testing needed not appropriate for outpatient work up and IV treatments appropriate due to intensity of  illness or inability to take PO   Dispo: The patient is from: Home              Anticipated d/c is to: Home              Anticipated d/c date is: > 3 days              Patient currently is not medically stable to d/c.  Remains on IV antibiotics, transfuse 1 unit packed red blood cells today.  Continue to monitor BMP for worsening creatinine.  Remains on IV fluid   Consultants:   Orthopedic surgery  Procedures:   None  Antimicrobials:  Anti-infectives (From admission, onward)   Start     Dose/Rate Route Frequency Ordered Stop   09/14/19 1300  vancomycin (VANCOREADY) IVPB 1250 mg/250 mL        1,250 mg 166.7 mL/hr over 90 Minutes Intravenous Every 24 hours 09/13/19 1215     09/13/19 2200  piperacillin-tazobactam (ZOSYN) IVPB 3.375 g  Status:  Discontinued        3.375 g 12.5 mL/hr over 240 Minutes Intravenous Every 8 hours 09/13/19 1215 09/13/19 1519   09/13/19 1730  piperacillin-tazobactam (ZOSYN) IVPB 3.375 g        3.375 g 12.5 mL/hr over 240 Minutes Intravenous Every 8 hours 09/13/19 1519     09/13/19 1230  vancomycin (VANCOREADY) IVPB 2000 mg/400 mL        2,000 mg 200 mL/hr over 120 Minutes Intravenous  Once 09/13/19 1215 09/13/19 1635   09/13/19 1230  piperacillin-tazobactam (ZOSYN) IVPB 3.375 g        3.375 g 100 mL/hr over 30 Minutes Intravenous  Once 09/13/19 1215 09/13/19 1350   09/13/19 1215  vancomycin (VANCOCIN) IVPB 1000 mg/200 mL premix  Status:  Discontinued        1,000 mg 200 mL/hr over 60 Minutes Intravenous  Once 09/13/19 1208 09/13/19 1215        Objective: Vitals:   09/14/19 0000 09/14/19 0537 09/14/19 0752 09/14/19 1131  BP: 124/65 127/74 (!) 118/92 116/66  Pulse: 92 79 93 67  Resp: 19 15 16 20   Temp: 98.2 F (36.8 C) 99 F (37.2 C) 99.4 F (37.4 C) 99.9 F (37.7 C)  TempSrc: Oral Oral Oral Oral  SpO2: 100% 100% 100% 100%  Weight:  133.1 kg    Height:        Intake/Output Summary (Last 24 hours) at 09/14/2019 1208 Last data filed at  09/14/2019 0631 Gross per 24 hour  Intake 3208.91 ml  Output 450 ml  Net 2758.91 ml   Filed Weights   09/13/19 1157 09/14/19 0537  Weight: 110.2 kg 133.1 kg    Examination:  General exam: Appears calm and comfortable  Respiratory system: Clear to auscultation. Respiratory effort normal. No respiratory distress. No conversational dyspnea.  Cardiovascular system: S1 & S2 heard, RRR. No murmurs. No pedal edema. Gastrointestinal system: Abdomen is nondistended, soft and nontender. Normal bowel sounds heard. Central nervous system: Alert and oriented. No focal neurological deficits. Speech clear.  Extremities: Symmetric in appearance  Skin: Plantar surface of left great toe with large ulceration and drainage Psychiatry: Judgement and insight appear normal. Mood & affect appropriate.   Data  Reviewed: I have personally reviewed following labs and imaging studies  CBC: Recent Labs  Lab 09/13/19 1217 09/13/19 1228 09/14/19 0731  WBC 14.6*  --  8.7  NEUTROABS 12.8*  --   --   HGB 7.3* 6.8* 6.8*  HCT 23.9* 20.0* 22.9*  MCV 81.0  --  80.6  PLT 191  --  505   Basic Metabolic Panel: Recent Labs  Lab 09/13/19 1217 09/13/19 1228 09/14/19 0731  NA 130* 132* 133*  K 4.0 4.1 4.4  CL 102 102 103  CO2 18*  --  19*  GLUCOSE 289* 291* 101*  BUN 27* 26* 34*  CREATININE 2.59* 2.70* 3.17*  CALCIUM 7.7*  --  8.2*   GFR: Estimated Creatinine Clearance: 33 mL/min (A) (by C-G formula based on SCr of 3.17 mg/dL (H)). Liver Function Tests: Recent Labs  Lab 09/13/19 1217  AST 15  ALT 13  ALKPHOS 158*  BILITOT 0.4  PROT 5.5*  ALBUMIN 1.7*   No results for input(s): LIPASE, AMYLASE in the last 168 hours. No results for input(s): AMMONIA in the last 168 hours. Coagulation Profile: Recent Labs  Lab 09/13/19 1217  INR 1.5*   Cardiac Enzymes: No results for input(s): CKTOTAL, CKMB, CKMBINDEX, TROPONINI in the last 168 hours. BNP (last 3 results) No results for input(s): PROBNP in  the last 8760 hours. HbA1C: Recent Labs    09/13/19 1844  HGBA1C 9.0*   CBG: Recent Labs  Lab 09/13/19 1225 09/13/19 1904 09/13/19 2147 09/14/19 0751 09/14/19 1131  GLUCAP 232* 222* 181* 101* 130*   Lipid Profile: No results for input(s): CHOL, HDL, LDLCALC, TRIG, CHOLHDL, LDLDIRECT in the last 72 hours. Thyroid Function Tests: No results for input(s): TSH, T4TOTAL, FREET4, T3FREE, THYROIDAB in the last 72 hours. Anemia Panel: Recent Labs    09/13/19 1830 09/14/19 0731  TIBC 158*  --   IRON <5*  --   RETICCTPCT  --  0.8   Sepsis Labs: Recent Labs  Lab 09/13/19 1204  LATICACIDVEN 1.7    Recent Results (from the past 240 hour(s))  Blood culture (routine x 2)     Status: None   Collection Time: 09/05/19  6:28 PM   Specimen: Right Antecubital; Blood  Result Value Ref Range Status   Specimen Description RIGHT ANTECUBITAL  Final   Special Requests   Final    BOTTLES DRAWN AEROBIC AND ANAEROBIC Blood Culture adequate volume   Culture   Final    NO GROWTH 5 DAYS Performed at [facility], [address], [place], [place] [address]    Report Status 09/10/2019 FINAL  Final  Blood culture (routine x 2)     Status: None   Collection Time: 09/05/19  6:28 PM   Specimen: BLOOD RIGHT HAND  Result Value Ref Range Status   Specimen Description BLOOD RIGHT HAND  Final   Special Requests   Final    BOTTLES DRAWN AEROBIC AND ANAEROBIC Blood Culture adequate volume   Culture   Final    NO GROWTH 5 DAYS Performed at [facility], [address], [place], [place] [address]    Report Status 09/10/2019 FINAL  Final  Culture, blood (Routine x 2)     Status: None (Preliminary result)   Collection Time: 09/13/19 12:58 PM   Specimen: BLOOD  Result Value Ref Range Status   Specimen Description BLOOD SITE NOT SPECIFIED  Final   Special Requests   Final    BOTTLES DRAWN AEROBIC AND ANAEROBIC Blood Culture results  may not be optimal due to an excessive volume of blood received in  culture bottles   Culture   Final    NO GROWTH < 24 HOURS Performed at [facility] [address] [place], [place] [address]    Report Status PENDING  Incomplete  Culture, blood (Routine x 2)     Status: None (Preliminary result)   Collection Time: 09/13/19  1:08 PM   Specimen: BLOOD  Result Value Ref Range Status   Specimen Description BLOOD SITE NOT SPECIFIED  Final   Special Requests   Final    BOTTLES DRAWN AEROBIC AND ANAEROBIC Blood Culture adequate volume   Culture   Final    NO GROWTH < 24 HOURS Performed at [facility], [place] [address], [place], [place] [address]    Report Status PENDING  Incomplete  SARS Coronavirus 2 by RT PCR (hospital order, performed in [facility] hospital lab) Nasopharyngeal Nasopharyngeal Swab     Status: None   Collection Time: 09/13/19  2:02 PM   Specimen: Nasopharyngeal Swab  Result Value Ref Range Status   SARS Coronavirus 2 NEGATIVE NEGATIVE Final    Comment: (NOTE) SARS-CoV-2 target nucleic acids are NOT DETECTED.  The SARS-CoV-2 RNA is generally detectable in upper and lower respiratory specimens during the acute phase of infection. The lowest concentration of SARS-CoV-2 viral copies this assay can detect is 250 copies / mL. A negative result does not preclude SARS-CoV-2 infection and should not be used as the sole basis for treatment or other patient management decisions.  A negative result may occur with improper specimen collection / handling, submission of specimen other than nasopharyngeal swab, presence of viral mutation(s) within the areas targeted by this assay, and inadequate number of viral copies (<250 copies / mL). A negative result must be combined with clinical observations, patient history, and epidemiological information.  Fact Sheet for Patients:   StrictlyIdeas.no  Fact Sheet for Healthcare Providers: BankingDealers.co.za  This test is not yet approved or   cleared by the [place] FDA and has been authorized for detection and/or diagnosis of SARS-CoV-2 by FDA under an Emergency Use Authorization (EUA).  This EUA will remain in effect (meaning this test can be used) for the duration of the COVID-19 declaration under Section 564(b)(1) of the Act, 21 U.S.C. section 360bbb-3(b)(1), unless the authorization is terminated or revoked sooner.  Performed at [facility], [place] [address] [place], [place] [address]       Radiology Studies: DG Chest 2 View  Result Date: 09/13/2019 CLINICAL DATA:  Sepsis EXAM: CHEST - 2 VIEW COMPARISON:  November 24, 2018 FINDINGS: Lungs are clear. Heart is borderline enlarged with pulmonary vascularity normal. No adenopathy. No bone lesions. IMPRESSION: Lungs clear.  Heart borderline enlarged.  No adenopathy. Electronically Signed   By: Lowella Grip III M.D.   On: 09/13/2019 13:54   DG Foot Complete Left  Result Date: 09/13/2019 CLINICAL DATA:  Soft tissue infection EXAM: LEFT FOOT - COMPLETE 3+ VIEW COMPARISON:  September 05, 2019 FINDINGS: Frontal, oblique, and lateral views were obtained. There is extensive soft tissue air tracking through the first digit from the proximal first metatarsal to the distal aspect of the first digit. There is a focal area of apparent rarefaction along the distal aspect of the first distal phalanx along the medial and lateral compartments. There may be a degree of bony destruction in this region. No similar changes suggesting bony destruction elsewhere. There is a  fracture along the lateral aspect of the proximal portion of the first distal phalanx. No other fracture evident. No dislocation. No joint space narrowing. IMPRESSION: 1. Soft tissue air tracking along the first digit extending from the first distal phalanx to the proximal first metatarsal consistent with soft tissue infection. 2. Concern for bony destruction along the distal aspect of the first proximal phalanx. 3.  Fracture along the lateral aspect of the proximal portion of the first distal phalanx. Electronically Signed   By: Lowella Grip III M.D.   On: 09/13/2019 13:53   VAS [place] ABI WITH/WO TBI  Result Date: 09/13/2019 LOWER EXTREMITY DOPPLER STUDY Indications: Ulceration. High Risk Factors: Diabetes.  Limitations: Today's exam was limited due to an open wound. Comparison Study: No prior studies. Performing Technologist: Carlos Levering RVT  Examination Guidelines: A complete evaluation includes at minimum, Doppler waveform signals and systolic blood pressure reading at the level of bilateral brachial, anterior tibial, and posterior tibial arteries, when vessel segments are accessible. Bilateral testing is considered an integral part of a complete examination. Photoelectric Plethysmograph (PPG) waveforms and toe systolic pressure readings are included as required and additional duplex testing as needed. Limited examinations for reoccurring indications may be performed as noted.  ABI Findings: +---------+------------------+-----+---------+--------+ Right    Rt Pressure (mmHg)IndexWaveform Comment  +---------+------------------+-----+---------+--------+ Brachial 100                    triphasic         +---------+------------------+-----+---------+--------+ PTA      95                0.95 triphasic         +---------+------------------+-----+---------+--------+ DP       89                0.89 triphasic         +---------+------------------+-----+---------+--------+ Great Toe79                0.79                   +---------+------------------+-----+---------+--------+ +--------+------------------+-----+----------+-------+ Left    Lt Pressure (mmHg)IndexWaveform  Comment +--------+------------------+-----+----------+-------+ Brachial                                 IV      +--------+------------------+-----+----------+-------+ PTA     94                0.94 biphasic           +--------+------------------+-----+----------+-------+ DP      73                0.73 monophasic        +--------+------------------+-----+----------+-------+ +-------+-----------+-----------+------------+------------+ ABI/TBIToday's ABIToday's TBIPrevious ABIPrevious TBI +-------+-----------+-----------+------------+------------+ Right  0.95       0.79                                +-------+-----------+-----------+------------+------------+ Left   0.94                                           +-------+-----------+-----------+------------+------------+  Summary: Right: Resting right ankle-brachial index is within normal range. No evidence of significant right lower extremity arterial disease. The right toe-brachial index is normal. 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 Unable to obtain TBI due to great toe ulceration.  *See table(s) above for measurements and observations.  Electronically signed by Servando Snare MD on 09/13/2019 at 4:47:23 PM.   Final       Scheduled Meds: . sodium chloride   Intravenous Once  . feeding supplement  1 Container Oral TID BM  . ferrous sulfate  325 mg Oral BID WC  . heparin  5,000 Units Subcutaneous Q12H  . insulin aspart  0-15 Units Subcutaneous TID WC  . insulin aspart  0-5 Units Subcutaneous QHS  . insulin aspart  4 Units Subcutaneous TID WC  . insulin glargine  40 Units Subcutaneous QHS  . pregabalin  150 mg Oral QHS  . rosuvastatin  20 mg Oral QHS   Continuous Infusions: . sodium chloride 125 mL/hr at 09/14/19 0919  . albumin human 25 g (09/14/19 0927)  . piperacillin-tazobactam (ZOSYN)  IV 3.375 g (09/14/19 0512)  . vancomycin       LOS: 1 day      Time spent: 35 minutes   Dessa Phi, DO [facility] 09/14/2019, 12:08 PM   Available via Epic secure chat 7am-7pm After these hours, please refer to coverage provider listed on [email or web address]
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
## 2019-09-14 NOTE — Progress Notes (Signed)
Inpatient Diabetes Program Recommendations  AACE/ADA: New Consensus Statement on Inpatient Glycemic Control (2015)  Target Ranges:  Prepandial:   less than 140 mg/dL      Peak postprandial:   less than 180 mg/dL (1-2 hours)      Critically ill patients:  140 - 180 mg/dL   Lab Results  Component Value Date   GLUCAP 101 (H) 09/14/2019   HGBA1C 9.0 (H) 09/13/2019    Review of Glycemic Control Results for Felicia Reynolds (MRN [id number]) as of 09/14/2019 10:03  Ref. Range 09/13/2019 19:04 09/13/2019 21:47 09/14/2019 07:51  Glucose-Capillary Latest Ref Range: 70 - 99 mg/dL 222 (H) 181 (H) 101 (H)   Diabetes history: Type 2 DM Outpatient Diabetes medications: Lantus 80 units BID? Novolog 2-30 units TID Current orders for Inpatient glycemic control: Lantus 40 units QHS, Novolog 8 units TID, Novolog 0-15 units TID, Novolog 0-5 units QHS  Inpatient Diabetes Program Recommendations:    Given renal status, may want to consider reducing meal coverage to Novolog 4 units TID (assuming patient is consuming >50% of meal).  Will plan to see patient.   Thanks, Bronson Curb, MSN, RNC-OB Diabetes Coordinator [phone] (8a-5p)

## 2019-09-15 ENCOUNTER — Inpatient Hospital Stay (HOSPITAL_COMMUNITY): Payer: Medicaid Other

## 2019-09-15 ENCOUNTER — Encounter (HOSPITAL_COMMUNITY): Payer: Self-pay | Admitting: Internal Medicine

## 2019-09-15 LAB — URINALYSIS, ROUTINE W REFLEX MICROSCOPIC
Bilirubin Urine: NEGATIVE
Glucose, UA: NEGATIVE mg/dL
Ketones, ur: NEGATIVE mg/dL
Nitrite: NEGATIVE
Protein, ur: >300 mg/dL — AB
Specific Gravity, Urine: 1.025 (ref 1.005–1.030)
pH: 5 (ref 5.0–8.0)

## 2019-09-15 LAB — CBC
HCT: 20.6 % — ABNORMAL LOW (ref 36.0–46.0)
Hemoglobin: 6.2 g/dL — CL (ref 12.0–15.0)
MCH: 24.6 pg — ABNORMAL LOW (ref 26.0–34.0)
MCHC: 30.1 g/dL (ref 30.0–36.0)
MCV: 81.7 fL (ref 80.0–100.0)
Platelets: 128 10*3/uL — ABNORMAL LOW (ref 150–400)
RBC: 2.52 MIL/uL — ABNORMAL LOW (ref 3.87–5.11)
RDW: 19 % — ABNORMAL HIGH (ref 11.5–15.5)
WBC: 6.4 10*3/uL (ref 4.0–10.5)
nRBC: 0 % (ref 0.0–0.2)

## 2019-09-15 LAB — BILIRUBIN, FRACTIONATED(TOT/DIR/INDIR)
Bilirubin, Direct: 1.3 mg/dL — ABNORMAL HIGH (ref 0.0–0.2)
Indirect Bilirubin: 0.6 mg/dL (ref 0.3–0.9)
Total Bilirubin: 1.9 mg/dL — ABNORMAL HIGH (ref 0.3–1.2)

## 2019-09-15 LAB — BASIC METABOLIC PANEL
Anion gap: 11 (ref 5–15)
BUN: 38 mg/dL — ABNORMAL HIGH (ref 6–20)
CO2: 17 mmol/L — ABNORMAL LOW (ref 22–32)
Calcium: 8.1 mg/dL — ABNORMAL LOW (ref 8.9–10.3)
Chloride: 104 mmol/L (ref 98–111)
Creatinine, Ser: 3.68 mg/dL — ABNORMAL HIGH (ref 0.44–1.00)
GFR calc Af Amer: 17 mL/min — ABNORMAL LOW (ref >60)
GFR calc non Af Amer: 14 mL/min — ABNORMAL LOW (ref >60)
Glucose, Bld: 159 mg/dL — ABNORMAL HIGH (ref 70–99)
Potassium: 4.6 mmol/L (ref 3.5–5.1)
Sodium: 132 mmol/L — ABNORMAL LOW (ref 135–145)

## 2019-09-15 LAB — URINALYSIS, MICROSCOPIC (REFLEX)

## 2019-09-15 LAB — CREATININE, URINE, RANDOM: Creatinine, Urine: 113.37 mg/dL

## 2019-09-15 LAB — PREPARE RBC (CROSSMATCH)

## 2019-09-15 LAB — DIRECT ANTIGLOBULIN TEST (NOT AT ARMC)
DAT, IgG: NEGATIVE
DAT, complement: NEGATIVE

## 2019-09-15 LAB — VITAMIN B12: Vitamin B-12: 962 pg/mL — ABNORMAL HIGH (ref 180–914)

## 2019-09-15 LAB — PROTIME-INR
INR: 1.3 — ABNORMAL HIGH (ref 0.8–1.2)
Prothrombin Time: 15.7 seconds — ABNORMAL HIGH (ref 11.4–15.2)

## 2019-09-15 LAB — SODIUM, URINE, RANDOM: Sodium, Ur: 30 mmol/L

## 2019-09-15 LAB — FERRITIN: Ferritin: 122 ng/mL (ref 11–307)

## 2019-09-15 LAB — HEMOGLOBIN AND HEMATOCRIT, BLOOD
HCT: 27.6 % — ABNORMAL LOW (ref 36.0–46.0)
Hemoglobin: 8.7 g/dL — ABNORMAL LOW (ref 12.0–15.0)

## 2019-09-15 LAB — MRSA PCR SCREENING: MRSA by PCR: NEGATIVE

## 2019-09-15 LAB — GLUCOSE, CAPILLARY
Glucose-Capillary: 115 mg/dL — ABNORMAL HIGH (ref 70–99)
Glucose-Capillary: 119 mg/dL — ABNORMAL HIGH (ref 70–99)
Glucose-Capillary: 137 mg/dL — ABNORMAL HIGH (ref 70–99)
Glucose-Capillary: 89 mg/dL (ref 70–99)

## 2019-09-15 LAB — LACTATE DEHYDROGENASE: LDH: 142 U/L (ref 98–192)

## 2019-09-15 LAB — FOLATE: Folate: 20.8 ng/mL (ref >5.9)

## 2019-09-15 LAB — SAVE SMEAR(SSMR), FOR PROVIDER SLIDE REVIEW

## 2019-09-15 LAB — VANCOMYCIN, RANDOM: Vancomycin Rm: 24

## 2019-09-15 MED ORDER — CHLORHEXIDINE GLUCONATE CLOTH 2 % EX PADS
6.0000 | MEDICATED_PAD | Freq: Every day | CUTANEOUS | Status: DC
  Administered 2019-09-15 – 2019-09-24 (×9): 6 via TOPICAL

## 2019-09-15 MED ORDER — SODIUM CHLORIDE 0.9% IV SOLUTION: Freq: Once | INTRAVENOUS | Status: DC

## 2019-09-15 NOTE — Consult Note (Addendum)
Renal Service Consult Note [facility]  CAROLLEE NUSSBAUMER 09/15/2019 Sol Blazing Requesting Physician:  Dr Maylene Roes, Lenna Sciara   Reason for Consult:  AoCKD 3 HPI: The patient is a 39 y.o. year-old w/ hx of CVA, PNA, panic attacks, HTN, DM2 on insulin, bipolar d/o presented w/ gen weakness and fever x 1 wk, also necrotic change of a toe on the left foot. Pt was febrile and was admitted for IV abx for osteo of L foot. seh was started on vanc/ zosyn IV.  Creat on admit was 2.70 (1.67 on 09/05/19). Yest creat was 3.17 and today 3.68. Asked to see for renal failure.   Pt's BP's since admit have been generally normal to high, w/ a couple BP's below 100 in the 1st 24 hrs. Pt has not rec'd and CT contrast. She was on ACEi at home but has not rec'd here. She rec'd IV zosyn and IV vanc.  Vanc was 2 gm on 9/9 and 1.25 gm on 9/10. No nsaids.    +Hx of kidney problems, not sure what type, kidney stones maybe. Says she has been "sick all my life", her mother was a drug addict.  Lives w/ her husband who takes care of her and "treats me good".    She has been sick for "2 months" with her left foot.   No voiding issues here, voided 2 times yesterday.      Date   Creat  eGFR  2013- jan 2020 0.7- 0.98  jan 2020  1.06 Nov 2018  1.39- 1.79 35- 47, IIIb  sept 1, 2021  1.67  38 ml/min, IIIb  sept 9, 2021  2.59  admit  sept 11, 2021  3.68  ROS  denies CP  no joint pain   no HA  no blurry vision  no rash  no diarrhea  no nausea/ vomiting    Past Medical History  Past Medical History:  Diagnosis Date  . Anxiety   . Arthritis   . Bipolar disorder ([place])   . Diabetes ([place]) 06/05/2015  . Diabetes mellitus   . Fibromyalgia   . History of abnormal cervical Pap smear 05/29/2015  . History of diabetes mellitus, type II 05/29/2015  . Hypertension   . Neuropathy   . Obesity 05/29/2015  . Osteoporosis   . Ovarian cyst 02/20/2016  . Ovarian cyst 02/20/2016   Simple cyst rt ovary  . Panic attacks    . Pneumonia   . Stroke ([place])   . Superficial fungus infection of skin 05/29/2015  . Vitamin D deficiency 06/05/2015   Past Surgical History  Past Surgical History:  Procedure Laterality Date  . CESAREAN SECTION    . HERNIA REPAIR    . THROAT SURGERY     Family History  Family History  Problem Relation Age of Onset  . Cancer Mother 31       breast  . Breast cancer Mother   . Breast cancer Maternal Aunt   . Breast cancer Maternal Grandmother   . Cancer Maternal Aunt    Social History  reports that she has been smoking cigarettes. She has a 6.50 pack-year smoking history. She has never used smokeless tobacco. She reports that she does not drink alcohol and does not use drugs. Allergies  Allergies  Allergen Reactions  . Hydrocodone Itching  . Latex Itching and Rash   Home medications Prior to Admission medications   Medication Sig Start Date End Date Taking? Authorizing Provider  bismuth subsalicylate (  PEPTO BISMOL) 262 MG/15ML suspension Take 30 mLs by mouth daily as needed for indigestion.   Yes [provider]  clonazePAM (KLONOPIN) 0.5 MG tablet Take 0.5 mg by mouth 2 (two) times daily as needed for anxiety. 08/13/19  Yes [provider]  cloNIDine (CATAPRES) 0.2 MG tablet Take 0.2 mg by mouth 2 (two) times daily.   Yes [provider]  diclofenac sodium (VOLTAREN) 1 % GEL Apply 2 g topically 4 (four) times daily as needed (pain).    Yes [provider]  furosemide (LASIX) 20 MG tablet Take 20 mg by mouth 2 (two) times daily as needed for edema. 04/21/19  Yes [provider]  hydrOXYzine (ATARAX/VISTARIL) 50 MG tablet Take 50 mg by mouth 2 (two) times daily as needed for anxiety. 06/09/19  Yes [provider]  insulin aspart (NOVOLOG) 100 UNIT/ML injection Inject 30 Units into the skin 3 (three) times daily with meals. Patient taking differently: Inject 2-30 Units into the skin 3 (three) times daily with meals. Per sliding scale:  not provided 09/15/18  Yes Eure, Mertie Clause, MD  Insulin Glargine (LANTUS SOLOSTAR) 100 UNIT/ML Solostar Pen Inject 80 Units into the skin at bedtime. Patient taking differently: Inject 80 Units into the skin 2 (two) times daily. Inject -80 units under the skin twice daily. 09/15/18  Yes Florian Buff, MD  lisinopril-hydrochlorothiazide (ZESTORETIC) 20-12.5 MG tablet Take 1 tablet by mouth daily. 04/21/19  Yes [provider]  pregabalin (LYRICA) 200 MG capsule Take 200 mg by mouth 3 (three) times daily.    Yes [provider]  PROAIR HFA 108 (90 Base) MCG/ACT inhaler Inhale 1 puff into the lungs 4 (four) times daily as needed for wheezing or shortness of breath. 08/15/19  Yes [provider]  silver sulfADIAZINE (SILVADENE) 1 % cream APPLY TO AFFECTED AREA 2 OR 3 TIMES DAILY Patient taking differently: Apply 1 application topically 3 (three) times daily. APPLY TO AFFECTED AREA 2 OR 3 TIMES DAILY 03/30/19  Yes Florian Buff, MD  azithromycin (ZITHROMAX) 500 MG tablet Take 1 tablet (500 mg total) by mouth daily. Patient not taking: Reported on 06/11/2019 11/25/18   Orson Eva, MD  cefdinir (OMNICEF) 300 MG capsule Take 1 capsule (300 mg total) by mouth 2 (two) times daily. Patient not taking: Reported on 09/13/2019 11/25/18   Orson Eva, MD  megestrol (MEGACE) 40 MG tablet Take 1 tablet (40 mg total) by mouth 3 (three) times daily as needed (regulate period). Patient not taking: Reported on 09/13/2019 12/05/18   Florian Buff, MD  NOVOLOG FLEXPEN 100 UNIT/ML FlexPen INJECT 30 UNITS SUBCUTANEOUSLY 3 TIMES A DAY WITH MEALS Patient not taking: Reported on 09/13/2019 09/07/19   Florian Buff, MD  rosuvastatin (CRESTOR) 20 MG tablet Take 20 mg by mouth at bedtime. 06/21/19   [provider]  SUBOXONE 8-2 MG FILM Place 2 Film under the tongue daily.  Patient not taking: Reported on 09/13/2019 11/17/18   [provider]  tiZANidine (ZANAFLEX) 4 MG capsule Take 4 mg by mouth 3  (three) times daily. Patient not taking: Reported on 09/13/2019    [provider]     Vitals:   09/15/19 0800 09/15/19 0930 09/15/19 0959 09/15/19 1000  BP: (!) 161/85  (!) 147/72 (!) 144/73  Pulse: 92 85 81 80  Resp: 18 18 16  (!) 24  Temp:   98.5 F (36.9 C)   TempSrc:   Oral   SpO2: 93% 94% 97% 97%  Weight:      Height:       Exam Gen pleasant WF, no distress No rash, cyanosis or gangrene Sclera anicteric, throat clear  No jvd or bruits, flat neck veins Chest some crackles R base, L clear RRR no MRG Abd soft ntnd no mass or ascites +bs obese GU defer MS no joint effusions or deformity Ext 1-2+ hip and 1+ pretib edema, L foot gangrenous changes Neuro is alert, Ox 3 , nf, no asterixis    Home meds:  - clonidine 0.2 bid/ crestor 20/ lasix 20 bid prn/ lisinopril-hctz 20-12.5 qd  - suboxone 8/2 two films qd/ zanaflex 4 tid/ lyrica 200 tid/ klonopin 0.5 bid prn  - insulin aspart 20u tid ac/ lantus 80u hs  - megace 40 tid prn  - prn's/ vitamins/ supplements    UA 9/9 - >50 wbc, 11-20 epi, 0-5 rbc, many bact, >300 prot, turbid   CXR 9/9 - IMPRESSION: Lungs clear.  Heart borderline enlarged.  No adenopathy.      Assessment/ Plan: 1. AoCKD 3b - b/l creat 1.7 from 10d ago, eGFR 35 ml/min.  Creat here 2.7 >> 3.5 today, here for gangrenous foot but not in shock, no contrast/ acei/ nsaids. Had rec'd 3.5 gm IV vanc. Not sure cause - will place foley, get urine lytes/ renal US, repeat UA w/ cath specimen. DC IV vanc.  Has rec'd IVF's already, has some leg edema. Consider more.  Will follow.  2. Gangrene/ osteo L foot - IV abx and poss amputation 3. IDDM 4. Bipolar d/o 5. HTN - home clonidine/ lisinopril/ hctz are on hold appropriately.  6. Obesity      Kelly Splinter  MD 09/15/2019, 10:22 AM  Recent Labs  Lab 09/14/19 0731 09/14/19 0731 09/14/19 1835 09/15/19 0332  WBC 8.7  --   --  6.4  HGB 6.8*   < > 7.4* 6.2*   < > = values in this interval not displayed.    Recent Labs  Lab 09/14/19 0731 09/15/19 0332  K 4.4 4.6  BUN 34* 38*  CREATININE 3.17* 3.68*  CALCIUM 8.2* 8.1*
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
## 2019-09-15 NOTE — Plan of Care (Signed)
Foley cath placed per order

## 2019-09-15 NOTE — Progress Notes (Signed)
Messaged Dr Kennon Holter 9 covering for [facility]) that HGB is 6.2 this am.

## 2019-09-15 NOTE — Progress Notes (Addendum)
PROGRESS NOTE    Felicia Reynolds  MKJ:[id number] DOB: 10-21-1980 DOA: 09/13/2019 PCP: [facility]     Brief Narrative:  Felicia Reynolds is a 39 y.o. female with medical history significant of IDDM with insulin resistance, diabetic neuropathy, hypertension, CKD stage III, chronic iron deficiency anemia secondary to menorrhagia, Suboxone therapy, presented with worsening of left toe/foot infection.  10 days ago, patient stepped on piece of mirror which cut open her left toe and distal left foot. She did not notice until few hours later because of the diabetic neuropathy.  2 days later she started to feel pain which gradually getting worse, she went to her PCP and received a few days of p.o. Augmentin.  For the last week, she developed worsening of pain, today pain is 10/10, constant, and swelling and purulent discharge with foul smell.  Also had subjective fever and chills at home.  Patient also reported she has separated with her spouse and has been living in the garage since last week.  Meanwhile she ran out of her insulin and Suboxone for about 7 days.  She has not been able to stand up and put weight on the left foot because of the severe pain, and she reported hard time to transfer herself out of bed. In the ED, patient was found to be tachycardia, blood pressure borderline low, fever of 103.  WBC 14.6, worsening of creatinine level from baseline 1.6 to now 2.59.  Left foot x-ray showed soft tissue air tracking along the fourth digit extending from first distal phalanx to proximal fourth metatarsal consistent with soft tissue infection concern for bony destruction of the first proximal phalanx and fracture of the first distal phalanx.  Orthopedic surgery was consulted.  New events last 24 hours / Subjective: States that she has not slept at all. Tired but otherwise without new complaints. Denies dizziness, lightheadedness, chest pain, SOB.  Has pain around left foot wound. Recently had her  menstrual cycle, ended about 3 days prior to admission.   Assessment & Plan:   Active Problems:   Osteomyelitis (HCC)   Sepsis ([place])   Severe sepsis secondary to osteomyelitis of the left great toe and UTI, present on admission -Presented with fever 103.2, tachycardia, leukocytosis, endorgan damage of AKI -Foot x-ray revealed soft tissue air tracking along the first digit extending from the first distal phalanx to the proximal first metatarsal consistent with soft tissue infection. Concern for bony destruction along the distal aspect of the first proximal phalanx. Fracture along the lateral aspect of the proximal portion of the first distal phalanx. -Blood cultures pending -Urine culture with multiple species -Continue vancomycin, Zosyn -Appreciate orthopedic surgery - likely amputation next week  -ABI 0.94 on the left  AKI on CKD stage IIIb -Baseline creatinine 1.7 -Continue IV fluid -Avoid nephrotoxins -Worsening despite IVF. Check renal US, urine sodium, urine cr. Nephrology consult today.   Acute on chronic iron deficiency anemia, chronic blood loss from menorrhagia -Transfuse 1 unit packed red blood cell 9/10 -Ferrous sulfate. Avoid IV iron in acute infectious process -Trend CBC -Hgb dropped back down to 6.2. Additional 2 unit pRBC today 9/11 -Patient admits to menorrhagia, no blood from stool reported -Check iron studies including ferritin, folate, vitamin B12 as well as hemolysis labs including bilirubin, haptoglobin, LDH, peripheral smear, [place].  Check FOBT  Uncontrolled diabetes with hyperglycemia and neuropathy -Hemoglobin A1c 9.0 -Continue Lantus, mealtime NovoLog, sliding scale insulin  Hyperlipidemia -Continue Crestor  Morbid obesity Estimated body mass index  is 46.65 kg/m as calculated from the following:   Height as of this encounter: 5' 6.5" (1.689 m).   Weight as of this encounter: 133.1 kg.      DVT prophylaxis:  Place and maintain sequential  compression device Start: 09/15/19 0718  Code Status: Full code Family Communication: No family at bedside Disposition Plan:   Status is: Inpatient  Remains inpatient appropriate because:Ongoing diagnostic testing needed not appropriate for outpatient work up and IV treatments appropriate due to intensity of illness or inability to take PO   Dispo: The patient is from: Home              Anticipated d/c is to: Home              Anticipated d/c date is: > 3 days              Patient currently is not medically stable to d/c.  Remains on IV antibiotics, transfuse 1 unit packed red blood cells today.  Remains on IV fluid.  Nephrology consulted.   Consultants:   Orthopedic surgery  Nephrology  Procedures:   None  Antimicrobials:  Anti-infectives (From admission, onward)   Start     Dose/Rate Route Frequency Ordered Stop   09/14/19 1300  vancomycin (VANCOREADY) IVPB 1250 mg/250 mL        1,250 mg 166.7 mL/hr over 90 Minutes Intravenous Every 24 hours 09/13/19 1215     09/13/19 2200  piperacillin-tazobactam (ZOSYN) IVPB 3.375 g  Status:  Discontinued        3.375 g 12.5 mL/hr over 240 Minutes Intravenous Every 8 hours 09/13/19 1215 09/13/19 1519   09/13/19 1730  piperacillin-tazobactam (ZOSYN) IVPB 3.375 g        3.375 g 12.5 mL/hr over 240 Minutes Intravenous Every 8 hours 09/13/19 1519     09/13/19 1230  vancomycin (VANCOREADY) IVPB 2000 mg/400 mL        2,000 mg 200 mL/hr over 120 Minutes Intravenous  Once 09/13/19 1215 09/13/19 1635   09/13/19 1230  piperacillin-tazobactam (ZOSYN) IVPB 3.375 g        3.375 g 100 mL/hr over 30 Minutes Intravenous  Once 09/13/19 1215 09/13/19 1350   09/13/19 1215  vancomycin (VANCOCIN) IVPB 1000 mg/200 mL premix  Status:  Discontinued        1,000 mg 200 mL/hr over 60 Minutes Intravenous  Once 09/13/19 1208 09/13/19 1215       Objective: Vitals:   09/15/19 0656 09/15/19 0800 09/15/19 0930 09/15/19 0959  BP: (!) 162/87 (!) 161/85  (!)  147/72  Pulse: 94 92 85 81  Resp: 15 18 18 16   Temp: 98.4 F (36.9 C)   98.5 F (36.9 C)  TempSrc: Oral   Oral  SpO2: 98% 93% 94% 97%  Weight:      Height:        Intake/Output Summary (Last 24 hours) at 09/15/2019 1003 Last data filed at 09/15/2019 0930 Gross per 24 hour  Intake 1258.5 ml  Output --  Net 1258.5 ml   Filed Weights   09/13/19 1157 09/14/19 0537  Weight: 110.2 kg 133.1 kg    Examination: General exam: Appears calm and comfortable, fatigued appearing Respiratory system: Clear to auscultation. Respiratory effort normal. Cardiovascular system: S1 & S2 heard, RRR.  Gastrointestinal system: Abdomen is nondistended, soft and nontender. Normal bowel sounds heard. Central nervous system: Alert and oriented. Non focal exam. Speech clear  Skin: Plantar surface of the left great toe with necrosis,  drainage, erythema extending up to her foot Psychiatry: Stable  Data Reviewed: I have personally reviewed following labs and imaging studies  CBC: Recent Labs  Lab 09/13/19 1217 09/13/19 1228 09/14/19 0731 09/14/19 1835 09/15/19 0332  WBC 14.6*  --  8.7  --  6.4  NEUTROABS 12.8*  --   --   --   --   HGB 7.3* 6.8* 6.8* 7.4* 6.2*  HCT 23.9* 20.0* 22.9* 24.0* 20.6*  MCV 81.0  --  80.6  --  81.7  PLT 191  --  172  --  341*   Basic Metabolic Panel: Recent Labs  Lab 09/13/19 1217 09/13/19 1228 09/14/19 0731 09/15/19 0332  NA 130* 132* 133* 132*  K 4.0 4.1 4.4 4.6  CL 102 102 103 104  CO2 18*  --  19* 17*  GLUCOSE 289* 291* 101* 159*  BUN 27* 26* 34* 38*  CREATININE 2.59* 2.70* 3.17* 3.68*  CALCIUM 7.7*  --  8.2* 8.1*   GFR: Estimated Creatinine Clearance: 28.4 mL/min (A) (by C-G formula based on SCr of 3.68 mg/dL (H)). Liver Function Tests: Recent Labs  Lab 09/13/19 1217  AST 15  ALT 13  ALKPHOS 158*  BILITOT 0.4  PROT 5.5*  ALBUMIN 1.7*   No results for input(s): LIPASE, AMYLASE in the last 168 hours. No results for input(s): AMMONIA in the last  168 hours. Coagulation Profile: Recent Labs  Lab 09/13/19 1217  INR 1.5*   Cardiac Enzymes: No results for input(s): CKTOTAL, CKMB, CKMBINDEX, TROPONINI in the last 168 hours. BNP (last 3 results) No results for input(s): PROBNP in the last 8760 hours. HbA1C: Recent Labs    09/13/19 1844  HGBA1C 9.0*   CBG: Recent Labs  Lab 09/14/19 0751 09/14/19 1131 09/14/19 1813 09/14/19 2125 09/15/19 0801  GLUCAP 101* 130* 157* 152* 115*   Lipid Profile: No results for input(s): CHOL, HDL, LDLCALC, TRIG, CHOLHDL, LDLDIRECT in the last 72 hours. Thyroid Function Tests: No results for input(s): TSH, T4TOTAL, FREET4, T3FREE, THYROIDAB in the last 72 hours. Anemia Panel: Recent Labs    09/13/19 1830 09/14/19 0731  TIBC 158*  --   IRON <5*  --   RETICCTPCT  --  0.8   Sepsis Labs: Recent Labs  Lab 09/13/19 1204  LATICACIDVEN 1.7    Recent Results (from the past 240 hour(s))  Blood culture (routine x 2)     Status: None   Collection Time: 09/05/19  6:28 PM   Specimen: Right Antecubital; Blood  Result Value Ref Range Status   Specimen Description RIGHT ANTECUBITAL  Final   Special Requests   Final    BOTTLES DRAWN AEROBIC AND ANAEROBIC Blood Culture adequate volume   Culture   Final    NO GROWTH 5 DAYS Performed at [facility], [address] [place], [place] [address]    Report Status 09/10/2019 FINAL  Final  Blood culture (routine x 2)     Status: None   Collection Time: 09/05/19  6:28 PM   Specimen: BLOOD RIGHT HAND  Result Value Ref Range Status   Specimen Description BLOOD RIGHT HAND  Final   Special Requests   Final    BOTTLES DRAWN AEROBIC AND ANAEROBIC Blood Culture adequate volume   Culture   Final    NO GROWTH 5 DAYS Performed at [facility], [address], [place], [place] [address]    Report Status 09/10/2019 FINAL  Final  Culture, blood (Routine x 2)     Status: None (  Preliminary result)   Collection Time: 09/13/19 12:58 PM   Specimen: BLOOD   Result Value Ref Range Status   Specimen Description BLOOD SITE NOT SPECIFIED  Final   Special Requests   Final    BOTTLES DRAWN AEROBIC AND ANAEROBIC Blood Culture results may not be optimal due to an excessive volume of blood received in culture bottles   Culture   Final    NO GROWTH < 24 HOURS Performed at [facility], [address], [place], [place] [address]    Report Status PENDING  Incomplete  Culture, blood (Routine x 2)     Status: None (Preliminary result)   Collection Time: 09/13/19  1:08 PM   Specimen: BLOOD  Result Value Ref Range Status   Specimen Description BLOOD SITE NOT SPECIFIED  Final   Special Requests   Final    BOTTLES DRAWN AEROBIC AND ANAEROBIC Blood Culture adequate volume   Culture   Final    NO GROWTH < 24 HOURS Performed at [facility], [place] [address], [place], [place] [address]    Report Status PENDING  Incomplete  SARS Coronavirus 2 by RT PCR (hospital order, performed in [facility] hospital lab) Nasopharyngeal Nasopharyngeal Swab     Status: None   Collection Time: 09/13/19  2:02 PM   Specimen: Nasopharyngeal Swab  Result Value Ref Range Status   SARS Coronavirus 2 NEGATIVE NEGATIVE Final    Comment: (NOTE) SARS-CoV-2 target nucleic acids are NOT DETECTED.  The SARS-CoV-2 RNA is generally detectable in upper and lower respiratory specimens during the acute phase of infection. The lowest concentration of SARS-CoV-2 viral copies this assay can detect is 250 copies / mL. A negative result does not preclude SARS-CoV-2 infection and should not be used as the sole basis for treatment or other patient management decisions.  A negative result may occur with improper specimen collection / handling, submission of specimen other than nasopharyngeal swab, presence of viral mutation(s) within the areas targeted by this assay, and inadequate number of viral copies (<250 copies / mL). A negative result must be combined with  clinical observations, patient history, and epidemiological information.  Fact Sheet for Patients:   StrictlyIdeas.no  Fact Sheet for Healthcare Providers: BankingDealers.co.za  This test is not yet approved or  cleared by the [place] FDA and has been authorized for detection and/or diagnosis of SARS-CoV-2 by FDA under an Emergency Use Authorization (EUA).  This EUA will remain in effect (meaning this test can be used) for the duration of the COVID-19 declaration under Section 564(b)(1) of the Act, 21 U.S.C. section 360bbb-3(b)(1), unless the authorization is terminated or revoked sooner.  Performed at [facility], [place] [address], [place], [place] [address]   Urine culture     Status: Abnormal   Collection Time: 09/13/19  8:06 PM   Specimen: In/Out Cath Urine  Result Value Ref Range Status   Specimen Description IN/OUT CATH URINE  Final   Special Requests   Final    NONE Performed at [facility], [place] [address], [place], [place] [address]    Culture MULTIPLE SPECIES PRESENT, SUGGEST RECOLLECTION (A)  Final   Report Status 09/14/2019 FINAL  Final  MRSA PCR Screening     Status: None   Collection Time: 09/15/19  4:55 AM   Specimen: Nasal Mucosa; Nasopharyngeal  Result Value Ref Range Status   MRSA by PCR NEGATIVE NEGATIVE Final    Comment:  The GeneXpert MRSA Assay (FDA approved for NASAL specimens only), is one component of a comprehensive MRSA colonization surveillance program. It is not intended to diagnose MRSA infection nor to guide or monitor treatment for MRSA infections. Performed at [facility], [place] [address] [place], [place] [address]       Radiology Studies: DG Chest 2 View  Result Date: 09/13/2019 CLINICAL DATA:  Sepsis EXAM: CHEST - 2 VIEW COMPARISON:  November 24, 2018 FINDINGS: Lungs are clear. Heart is borderline enlarged with pulmonary vascularity normal. No adenopathy.  No bone lesions. IMPRESSION: Lungs clear.  Heart borderline enlarged.  No adenopathy. Electronically Signed   By: Lowella Grip III M.D.   On: 09/13/2019 13:54   DG Foot Complete Left  Result Date: 09/13/2019 CLINICAL DATA:  Soft tissue infection EXAM: LEFT FOOT - COMPLETE 3+ VIEW COMPARISON:  September 05, 2019 FINDINGS: Frontal, oblique, and lateral views were obtained. There is extensive soft tissue air tracking through the first digit from the proximal first metatarsal to the distal aspect of the first digit. There is a focal area of apparent rarefaction along the distal aspect of the first distal phalanx along the medial and lateral compartments. There may be a degree of bony destruction in this region. No similar changes suggesting bony destruction elsewhere. There is a fracture along the lateral aspect of the proximal portion of the first distal phalanx. No other fracture evident. No dislocation. No joint space narrowing. IMPRESSION: 1. Soft tissue air tracking along the first digit extending from the first distal phalanx to the proximal first metatarsal consistent with soft tissue infection. 2. Concern for bony destruction along the distal aspect of the first proximal phalanx. 3. Fracture along the lateral aspect of the proximal portion of the first distal phalanx. Electronically Signed   By: Lowella Grip III M.D.   On: 09/13/2019 13:53   VAS [place] ABI WITH/WO TBI  Result Date: 09/13/2019 LOWER EXTREMITY DOPPLER STUDY Indications: Ulceration. High Risk Factors: Diabetes.  Limitations: Today's exam was limited due to an open wound. Comparison Study: No prior studies. Performing Technologist: Carlos Levering RVT  Examination Guidelines: A complete evaluation includes at minimum, Doppler waveform signals and systolic blood pressure reading at the level of bilateral brachial, anterior tibial, and posterior tibial arteries, when vessel segments are accessible. Bilateral testing is considered an integral  part of a complete examination. Photoelectric Plethysmograph (PPG) waveforms and toe systolic pressure readings are included as required and additional duplex testing as needed. Limited examinations for reoccurring indications may be performed as noted.  ABI Findings: +---------+------------------+-----+---------+--------+ Right    Rt Pressure (mmHg)IndexWaveform Comment  +---------+------------------+-----+---------+--------+ Brachial 100                    triphasic         +---------+------------------+-----+---------+--------+ PTA      95                0.95 triphasic         +---------+------------------+-----+---------+--------+ DP       89                0.89 triphasic         +---------+------------------+-----+---------+--------+ Great Toe79                0.79                   +---------+------------------+-----+---------+--------+ +--------+------------------+-----+----------+-------+ Left    Lt Pressure (mmHg)IndexWaveform  Comment +--------+------------------+-----+----------+-------+ Brachial  IV      +--------+------------------+-----+----------+-------+ PTA     94                0.94 biphasic          +--------+------------------+-----+----------+-------+ DP      73                0.73 monophasic        +--------+------------------+-----+----------+-------+ +-------+-----------+-----------+------------+------------+ ABI/TBIToday's ABIToday's TBIPrevious ABIPrevious TBI +-------+-----------+-----------+------------+------------+ Right  0.95       0.79                                +-------+-----------+-----------+------------+------------+ Left   0.94                                           +-------+-----------+-----------+------------+------------+  Summary: Right: Resting right ankle-brachial index is within normal range. No evidence of significant right lower extremity arterial disease. The  right toe-brachial index is normal. Left: Resting left ankle-brachial index indicates mild left lower extremity arterial disease. Unable to obtain TBI due to great toe ulceration.  *See table(s) above for measurements and observations.  Electronically signed by Servando Snare MD on 09/13/2019 at 4:47:23 PM.   Final       Scheduled Meds: . sodium chloride   Intravenous Once  . feeding supplement  1 Container Oral TID BM  . ferrous sulfate  325 mg Oral BID WC  . insulin aspart  0-15 Units Subcutaneous TID WC  . insulin aspart  0-5 Units Subcutaneous QHS  . insulin aspart  4 Units Subcutaneous TID WC  . insulin glargine  40 Units Subcutaneous QHS  . pregabalin  150 mg Oral QHS  . rosuvastatin  20 mg Oral QHS   Continuous Infusions: . sodium chloride 125 mL/hr at 09/14/19 1812  . piperacillin-tazobactam (ZOSYN)  IV 3.375 g (09/15/19 0254)  . vancomycin 1,250 mg (09/14/19 1251)     LOS: 2 days      Time spent: 35 minutes   Dessa Phi, DO [facility] 09/15/2019, 10:03 AM   Available via Epic secure chat 7am-7pm After these hours, please refer to coverage provider listed on [email or web address]

## 2019-09-16 ENCOUNTER — Inpatient Hospital Stay (HOSPITAL_COMMUNITY): Payer: Medicaid Other

## 2019-09-16 DIAGNOSIS — I10 Essential (primary) hypertension: Secondary | ICD-10-CM

## 2019-09-16 LAB — HEPATIC FUNCTION PANEL
ALT: 15 U/L (ref 0–44)
AST: 31 U/L (ref 15–41)
Albumin: 2.2 g/dL — ABNORMAL LOW (ref 3.5–5.0)
Alkaline Phosphatase: 488 U/L — ABNORMAL HIGH (ref 38–126)
Bilirubin, Direct: 1 mg/dL — ABNORMAL HIGH (ref 0.0–0.2)
Indirect Bilirubin: 1.1 mg/dL — ABNORMAL HIGH (ref 0.3–0.9)
Total Bilirubin: 2.1 mg/dL — ABNORMAL HIGH (ref 0.3–1.2)
Total Protein: 6.2 g/dL — ABNORMAL LOW (ref 6.5–8.1)

## 2019-09-16 LAB — TYPE AND SCREEN
ABO/RH(D): O POS
Antibody Screen: NEGATIVE
Unit division: 0
Unit division: 0
Unit division: 0

## 2019-09-16 LAB — NA AND K (SODIUM & POTASSIUM), RAND UR
Potassium Urine: 35 mmol/L
Sodium, Ur: 37 mmol/L

## 2019-09-16 LAB — BPAM RBC
Blood Product Expiration Date: 202110082359
Blood Product Expiration Date: 202110082359
Blood Product Expiration Date: 202110092359
ISSUE DATE / TIME: 202109101230
ISSUE DATE / TIME: 202109110626
ISSUE DATE / TIME: 202109111017
Unit Type and Rh: 5100
Unit Type and Rh: 5100
Unit Type and Rh: 5100

## 2019-09-16 LAB — CBC
HCT: 26.2 % — ABNORMAL LOW (ref 36.0–46.0)
Hemoglobin: 8.2 g/dL — ABNORMAL LOW (ref 12.0–15.0)
MCH: 25.5 pg — ABNORMAL LOW (ref 26.0–34.0)
MCHC: 31.3 g/dL (ref 30.0–36.0)
MCV: 81.6 fL (ref 80.0–100.0)
Platelets: 156 10*3/uL (ref 150–400)
RBC: 3.21 MIL/uL — ABNORMAL LOW (ref 3.87–5.11)
RDW: 19.1 % — ABNORMAL HIGH (ref 11.5–15.5)
WBC: 7.6 10*3/uL (ref 4.0–10.5)
nRBC: 0 % (ref 0.0–0.2)

## 2019-09-16 LAB — HEPATITIS PANEL, ACUTE
HCV Ab: NONREACTIVE
Hep A IgM: NONREACTIVE
Hep B C IgM: NONREACTIVE
Hepatitis B Surface Ag: NONREACTIVE

## 2019-09-16 LAB — BASIC METABOLIC PANEL
Anion gap: 11 (ref 5–15)
BUN: 47 mg/dL — ABNORMAL HIGH (ref 6–20)
CO2: 18 mmol/L — ABNORMAL LOW (ref 22–32)
Calcium: 8.5 mg/dL — ABNORMAL LOW (ref 8.9–10.3)
Chloride: 105 mmol/L (ref 98–111)
Creatinine, Ser: 4.51 mg/dL — ABNORMAL HIGH (ref 0.44–1.00)
GFR calc Af Amer: 13 mL/min — ABNORMAL LOW (ref >60)
GFR calc non Af Amer: 11 mL/min — ABNORMAL LOW (ref >60)
Glucose, Bld: 76 mg/dL (ref 70–99)
Potassium: 6.2 mmol/L — ABNORMAL HIGH (ref 3.5–5.1)
Sodium: 134 mmol/L — ABNORMAL LOW (ref 135–145)

## 2019-09-16 LAB — PROTEIN / CREATININE RATIO, URINE
Creatinine, Urine: 90.06 mg/dL
Protein Creatinine Ratio: 3.15 mg/mg{Cre} — ABNORMAL HIGH (ref 0.00–0.15)
Total Protein, Urine: 284 mg/dL

## 2019-09-16 LAB — GLUCOSE, CAPILLARY
Glucose-Capillary: 77 mg/dL (ref 70–99)
Glucose-Capillary: 118 mg/dL — ABNORMAL HIGH (ref 70–99)
Glucose-Capillary: 96 mg/dL (ref 70–99)
Glucose-Capillary: 89 mg/dL (ref 70–99)

## 2019-09-16 LAB — GAMMA GT: GGT: 115 U/L — ABNORMAL HIGH (ref 7–50)

## 2019-09-16 LAB — ECHOCARDIOGRAM COMPLETE
Area-P 1/2: 3.72 cm2
Height: 66.5 in
S' Lateral: 4.6 cm
Single Plane A4C EF: 31.6 %
Weight: 4987.69 oz

## 2019-09-16 LAB — POTASSIUM
Potassium: 6.2 mmol/L — ABNORMAL HIGH (ref 3.5–5.1)
Potassium: 5.4 mmol/L — ABNORMAL HIGH (ref 3.5–5.1)

## 2019-09-16 LAB — HAPTOGLOBIN: Haptoglobin: 336 mg/dL — ABNORMAL HIGH (ref 42–296)

## 2019-09-16 MED ORDER — SODIUM ZIRCONIUM CYCLOSILICATE 10 G PO PACK
10.0000 g | PACK | Freq: Every day | ORAL | Status: DC
  Administered 2019-09-16 – 2019-09-21 (×5): 10 g via ORAL
  Filled 2019-09-16 (×6): qty 1

## 2019-09-16 MED ORDER — SODIUM ZIRCONIUM CYCLOSILICATE 10 G PO PACK
10.0000 g | PACK | ORAL | Status: AC
  Administered 2019-09-16: 10 g via ORAL
  Filled 2019-09-16: qty 1

## 2019-09-16 MED ORDER — FUROSEMIDE 10 MG/ML IJ SOLN
INTRAMUSCULAR | Status: AC
  Administered 2019-09-16: 80 mg
  Filled 2019-09-16: qty 8

## 2019-09-16 MED ORDER — FUROSEMIDE 10 MG/ML IJ SOLN
120.0000 mg | Freq: Two times a day (BID) | INTRAVENOUS | Status: DC
  Administered 2019-09-16: 120 mg via INTRAVENOUS
  Filled 2019-09-16: qty 12
  Filled 2019-09-16: qty 10

## 2019-09-16 MED ORDER — FUROSEMIDE 10 MG/ML IJ SOLN: 80.0000 mg | Freq: Three times a day (TID) | INTRAMUSCULAR | Status: DC

## 2019-09-16 MED ORDER — SODIUM POLYSTYRENE SULFONATE 15 GM/60ML PO SUSP: 45.0000 g | Freq: Once | ORAL | Status: DC

## 2019-09-16 MED ORDER — FUROSEMIDE 10 MG/ML IJ SOLN: 80.0000 mg | Freq: Two times a day (BID) | INTRAMUSCULAR | Status: DC

## 2019-09-16 MED ORDER — DEXTROSE 50 % IV SOLN
1.0000 | Freq: Once | INTRAVENOUS | Status: AC
  Administered 2019-09-16: 50 mL via INTRAVENOUS
  Filled 2019-09-16: qty 50

## 2019-09-16 MED ORDER — INSULIN ASPART 100 UNIT/ML IV SOLN
10.0000 [IU] | Freq: Once | INTRAVENOUS | Status: AC
  Administered 2019-09-16: 10 [IU] via INTRAVENOUS

## 2019-09-16 NOTE — Progress Notes (Signed)
Pharmacy Antibiotic Note  Felicia Reynolds is a 39 y.o. female admitted on 09/13/2019 with diabetic foot infection.  Pharmacy has been consulted for Zosyn dosing.  ID: Diabetic foot infection + UTI, WBC wnl. Tmax 99.9, currently afebrile. WBC WNL  Vanc 9/9>>9/11 (rising Scr) Zosyn 9/9>>  9/9 BCx >>> 9/9 UCx >> mult species 9/11: MRSA PCR: neg  Plan: Zosyn 3.375g IV every 8 hours (extended infusion) Likely amputation next week Probably needs IV iron replacement (f/u renal recommendations) Notified Dr. Maylene Roes of K+ 6.2>>>Lokelma + Insulin/D50     Height: 5' 6.5" (168.9 cm) Weight: (!) 141.4 kg (311 lb 11.7 oz) IBW/kg (Calculated) : 60.45  Temp (24hrs), Avg:98.5 F (36.9 C), Min:98 F (36.7 C), Max:98.8 F (37.1 C)  Recent Labs  Lab 09/13/19 1204 09/13/19 1217 09/13/19 1228 09/14/19 0731 09/15/19 0332 09/15/19 1830 09/16/19 0700  WBC  --  14.6*  --  8.7 6.4  --  7.6  CREATININE  --  2.59* 2.70* 3.17* 3.68*  --  4.51*  LATICACIDVEN 1.7  --   --   --   --   --   --   VANCORANDOM  --   --   --   --   --  24  --     Estimated Creatinine Clearance: 24.1 mL/min (A) (by C-G formula based on SCr of 4.51 mg/dL (H)).    Allergies  Allergen Reactions  . Hydrocodone Itching  . Latex Itching and Rash   Marianne Golightly S. Alford Highland, PharmD, BCPS Clinical Staff Pharmacist [email or web address]  Wayland Salinas 09/16/2019 9:49 AM

## 2019-09-16 NOTE — Progress Notes (Addendum)
PROGRESS NOTE    Felicia Reynolds  CZY:[id number] DOB: 03-Jan-1981 DOA: 09/13/2019 PCP: [facility]     Brief Narrative:  Felicia Reynolds is a 39 y.o. female with medical history significant of IDDM with insulin resistance, diabetic neuropathy, hypertension, CKD stage III, chronic iron deficiency anemia secondary to menorrhagia, Suboxone therapy, presented with worsening of left toe/foot infection.  10 days ago, patient stepped on piece of mirror which cut open her left toe and distal left foot. She did not notice until few hours later because of the diabetic neuropathy.  2 days later she started to feel pain which gradually getting worse, she went to her PCP and received a few days of p.o. Augmentin.  For the last week, she developed worsening of pain, today pain is 10/10, constant, and swelling and purulent discharge with foul smell.  Also had subjective fever and chills at home.  Patient also reported she has separated with her spouse and has been living in the garage since last week.  Meanwhile she ran out of her insulin and Suboxone for about 7 days.  She has not been able to stand up and put weight on the left foot because of the severe pain, and she reported hard time to transfer herself out of bed. In the ED, patient was found to be tachycardia, blood pressure borderline low, fever of 103.  WBC 14.6, worsening of creatinine level from baseline 1.6 to now 2.59.  Left foot x-ray showed soft tissue air tracking along the fourth digit extending from first distal phalanx to proximal fourth metatarsal consistent with soft tissue infection concern for bony destruction of the first proximal phalanx and fracture of the first distal phalanx.  Orthopedic surgery was consulted.  New events last 24 hours / Subjective: States that she is very tired.  Denies any abdominal pain, nausea or vomiting.  Admits to lack of appetite  Assessment & Plan:   Active Problems:   Osteomyelitis (HCC)   Sepsis  ([place])   Severe sepsis secondary to osteomyelitis of the left great toe and UTI, present on admission -Presented with fever 103.2, tachycardia, leukocytosis, endorgan damage of AKI -Foot x-ray revealed soft tissue air tracking along the first digit extending from the first distal phalanx to the proximal first metatarsal consistent with soft tissue infection. Concern for bony destruction along the distal aspect of the first proximal phalanx. Fracture along the lateral aspect of the proximal portion of the first distal phalanx. -Blood cultures negative to date -Urine culture with multiple species -Continue Zosyn -Appreciate orthopedic surgery - likely amputation next week  -ABI 0.94 on the left  AKI on CKD stage IIIb -Baseline creatinine 1.7 -Avoid nephrotoxins -Renal ultrasound showed decreased corticomedullary differentiation, no hydronephrosis -Continue IV fluid -Nephrology following  Hyperkalemia -Ordered Lokelma, insulin/D50 -Trend K   Acute on chronic iron deficiency anemia, chronic blood loss from menorrhagia -Transfused total 3 unit packed red blood cell  -Ferrous sulfate. Avoid IV iron in acute infectious process -Trend CBC -Patient admits to menorrhagia, no blood from stool reported. FOBT pending -Hemolysis labs negative so far    Uncontrolled diabetes with hyperglycemia and neuropathy -Hemoglobin A1c 9.0 -Continue Lantus, mealtime NovoLog, sliding scale insulin  Abnormal liver on ultrasound -LFTs normal, patient denies any history of alcohol abuse.  She denies any right upper quadrant abdominal pain -Alk phosphatase is elevated, check GGT -Check hepatitis panel  Morbid obesity Estimated body mass index is 49.56 kg/m as calculated from the following:   Height as  of this encounter: 5' 6.5" (1.689 m).   Weight as of this encounter: 141.4 kg.      DVT prophylaxis:  Place and maintain sequential compression device Start: 09/15/19 0718  Code Status: Full  code Family Communication: No family at bedside Disposition Plan:   Status is: Inpatient  Remains inpatient appropriate because:Persistent severe electrolyte disturbances, Ongoing diagnostic testing needed not appropriate for outpatient work up and IV treatments appropriate due to intensity of illness or inability to take PO   Dispo: The patient is from: Home              Anticipated d/c is to: Home              Anticipated d/c date is: > 3 days              Patient currently is not medically stable to d/c.  Remains on IV antibiotics. Remains on IV fluid.  Planning for amputation next week   Consultants:   Orthopedic surgery  Nephrology  Procedures:   None  Antimicrobials:  Anti-infectives (From admission, onward)   Start     Dose/Rate Route Frequency Ordered Stop   09/14/19 1300  vancomycin (VANCOREADY) IVPB 1250 mg/250 mL  Status:  Discontinued        1,250 mg 166.7 mL/hr over 90 Minutes Intravenous Every 24 hours 09/13/19 1215 09/15/19 1130   09/13/19 2200  piperacillin-tazobactam (ZOSYN) IVPB 3.375 g  Status:  Discontinued        3.375 g 12.5 mL/hr over 240 Minutes Intravenous Every 8 hours 09/13/19 1215 09/13/19 1519   09/13/19 1730  piperacillin-tazobactam (ZOSYN) IVPB 3.375 g        3.375 g 12.5 mL/hr over 240 Minutes Intravenous Every 8 hours 09/13/19 1519     09/13/19 1230  vancomycin (VANCOREADY) IVPB 2000 mg/400 mL        2,000 mg 200 mL/hr over 120 Minutes Intravenous  Once 09/13/19 1215 09/13/19 1635   09/13/19 1230  piperacillin-tazobactam (ZOSYN) IVPB 3.375 g        3.375 g 100 mL/hr over 30 Minutes Intravenous  Once 09/13/19 1215 09/13/19 1350   09/13/19 1215  vancomycin (VANCOCIN) IVPB 1000 mg/200 mL premix  Status:  Discontinued        1,000 mg 200 mL/hr over 60 Minutes Intravenous  Once 09/13/19 1208 09/13/19 1215       Objective: Vitals:   09/16/19 0328 09/16/19 0400 09/16/19 0500 09/16/19 0807  BP: 119/64 123/64  (!) 158/80  Pulse:       Resp: 15 16  13   Temp: 98.5 F (36.9 C)   98.8 F (37.1 C)  TempSrc: Oral   Oral  SpO2: 97% 98%    Weight:   (!) 141.4 kg   Height:        Intake/Output Summary (Last 24 hours) at 09/16/2019 1131 Last data filed at 09/16/2019 1023 Gross per 24 hour  Intake 546.63 ml  Output 425 ml  Net 121.63 ml   Filed Weights   09/14/19 0537 09/15/19 1130 09/16/19 0500  Weight: 133.1 kg (!) 138 kg (!) 141.4 kg    Examination: General exam: Appears calm and comfortable, fatigued appearing Respiratory system: Clear to auscultation. Respiratory effort normal. Cardiovascular system: S1 & S2 heard, RRR. No pedal edema. Gastrointestinal system: Abdomen is nondistended, soft and nontender. Normal bowel sounds heard. Central nervous system: Alert and oriented. Speech clear  Extremities:    Psychiatry: Judgement and insight appear stable. Mood & affect appropriate.  Data Reviewed: I have personally reviewed following labs and imaging studies  CBC: Recent Labs  Lab 09/13/19 1217 09/13/19 1228 09/14/19 0731 09/14/19 1835 09/15/19 0332 09/15/19 1830 09/16/19 0700  WBC 14.6*  --  8.7  --  6.4  --  7.6  NEUTROABS 12.8*  --   --   --   --   --   --   HGB 7.3*   < > 6.8* 7.4* 6.2* 8.7* 8.2*  HCT 23.9*   < > 22.9* 24.0* 20.6* 27.6* 26.2*  MCV 81.0  --  80.6  --  81.7  --  81.6  PLT 191  --  172  --  128*  --  156   < > = values in this interval not displayed.   Basic Metabolic Panel: Recent Labs  Lab 09/13/19 1217 09/13/19 1217 09/13/19 1228 09/14/19 0731 09/15/19 0332 09/16/19 0700 09/16/19 0741  NA 130*  --  132* 133* 132* 134*  --   K 4.0   < > 4.1 4.4 4.6 6.2* 6.2*  CL 102  --  102 103 104 105  --   CO2 18*  --   --  19* 17* 18*  --   GLUCOSE 289*  --  291* 101* 159* 76  --   BUN 27*  --  26* 34* 38* 47*  --   CREATININE 2.59*  --  2.70* 3.17* 3.68* 4.51*  --   CALCIUM 7.7*  --   --  8.2* 8.1* 8.5*  --    < > = values in this interval not displayed.   GFR: Estimated  Creatinine Clearance: 24.1 mL/min (A) (by C-G formula based on SCr of 4.51 mg/dL (H)). Liver Function Tests: Recent Labs  Lab 09/13/19 1217 09/15/19 1830 09/16/19 0741  AST 15  --  31  ALT 13  --  15  ALKPHOS 158*  --  488*  BILITOT 0.4 1.9* 2.1*  PROT 5.5*  --  6.2*  ALBUMIN 1.7*  --  2.2*   No results for input(s): LIPASE, AMYLASE in the last 168 hours. No results for input(s): AMMONIA in the last 168 hours. Coagulation Profile: Recent Labs  Lab 09/13/19 1217 09/15/19 1830  INR 1.5* 1.3*   Cardiac Enzymes: No results for input(s): CKTOTAL, CKMB, CKMBINDEX, TROPONINI in the last 168 hours. BNP (last 3 results) No results for input(s): PROBNP in the last 8760 hours. HbA1C: Recent Labs    09/13/19 1844  HGBA1C 9.0*   CBG: Recent Labs  Lab 09/15/19 0801 09/15/19 1334 09/15/19 1530 09/15/19 2157 09/16/19 0805  GLUCAP 115* 119* 137* 89 77   Lipid Profile: No results for input(s): CHOL, HDL, LDLCALC, TRIG, CHOLHDL, LDLDIRECT in the last 72 hours. Thyroid Function Tests: No results for input(s): TSH, T4TOTAL, FREET4, T3FREE, THYROIDAB in the last 72 hours. Anemia Panel: Recent Labs    09/13/19 1830 09/14/19 0731 09/15/19 1830  VITAMINB12  --   --  962*  FOLATE  --   --  20.8  FERRITIN  --   --  122  TIBC 158*  --   --   IRON <5*  --   --   RETICCTPCT  --  0.8  --    Sepsis Labs: Recent Labs  Lab 09/13/19 1204  LATICACIDVEN 1.7    Recent Results (from the past 240 hour(s))  Culture, blood (Routine x 2)     Status: None (Preliminary result)   Collection Time: 09/13/19 12:58 PM   Specimen: BLOOD  Result Value Ref Range Status   Specimen Description BLOOD SITE NOT SPECIFIED  Final   Special Requests   Final    BOTTLES DRAWN AEROBIC AND ANAEROBIC Blood Culture results may not be optimal due to an excessive volume of blood received in culture bottles   Culture   Final    NO GROWTH 2 DAYS Performed at [facility] [address],  [place], [place] [address]    Report Status PENDING  Incomplete  Culture, blood (Routine x 2)     Status: None (Preliminary result)   Collection Time: 09/13/19  1:08 PM   Specimen: BLOOD  Result Value Ref Range Status   Specimen Description BLOOD SITE NOT SPECIFIED  Final   Special Requests   Final    BOTTLES DRAWN AEROBIC AND ANAEROBIC Blood Culture adequate volume   Culture   Final    NO GROWTH 2 DAYS Performed at [facility], [address], [place], [place] [address]    Report Status PENDING  Incomplete  SARS Coronavirus 2 by RT PCR (hospital order, performed in [facility] hospital lab) Nasopharyngeal Nasopharyngeal Swab     Status: None   Collection Time: 09/13/19  2:02 PM   Specimen: Nasopharyngeal Swab  Result Value Ref Range Status   SARS Coronavirus 2 NEGATIVE NEGATIVE Final    Comment: (NOTE) SARS-CoV-2 target nucleic acids are NOT DETECTED.  The SARS-CoV-2 RNA is generally detectable in upper and lower respiratory specimens during the acute phase of infection. The lowest concentration of SARS-CoV-2 viral copies this assay can detect is 250 copies / mL. A negative result does not preclude SARS-CoV-2 infection and should not be used as the sole basis for treatment or other patient management decisions.  A negative result may occur with improper specimen collection / handling, submission of specimen other than nasopharyngeal swab, presence of viral mutation(s) within the areas targeted by this assay, and inadequate number of viral copies (<250 copies / mL). A negative result must be combined with clinical observations, patient history, and epidemiological information.  Fact Sheet for Patients:   StrictlyIdeas.no  Fact Sheet for Healthcare Providers: BankingDealers.co.za  This test is not yet approved or  cleared by the [place] FDA and has been authorized for detection and/or diagnosis of SARS-CoV-2 by FDA under  an Emergency Use Authorization (EUA).  This EUA will remain in effect (meaning this test can be used) for the duration of the COVID-19 declaration under Section 564(b)(1) of the Act, 21 U.S.C. section 360bbb-3(b)(1), unless the authorization is terminated or revoked sooner.  Performed at [facility], [place] [address], [place], [place] [address]   Urine culture     Status: Abnormal   Collection Time: 09/13/19  8:06 PM   Specimen: In/Out Cath Urine  Result Value Ref Range Status   Specimen Description IN/OUT CATH URINE  Final   Special Requests   Final    NONE Performed at [facility], [place] [address] [place], [place] [address]    Culture MULTIPLE SPECIES PRESENT, SUGGEST RECOLLECTION (A)  Final   Report Status 09/14/2019 FINAL  Final  MRSA PCR Screening     Status: None   Collection Time: 09/15/19  4:55 AM   Specimen: Nasal Mucosa; Nasopharyngeal  Result Value Ref Range Status   MRSA by PCR NEGATIVE NEGATIVE Final    Comment:        The GeneXpert MRSA Assay (FDA approved for NASAL specimens only), is one component  of a comprehensive MRSA colonization surveillance program. It is not intended to diagnose MRSA infection nor to guide or monitor treatment for MRSA infections. Performed at [facility], [place] [address], [place], [place] [address]       Radiology Studies: US RENAL  Result Date: 09/15/2019 CLINICAL DATA:  Acute kidney injury EXAM: RENAL / URINARY TRACT ULTRASOUND COMPLETE COMPARISON:  CT of November 24, 2018 FINDINGS: Right Kidney: Renal measurements: 12.2 x 5.4 x 7.0 cm = volume: 241 mL. No hydronephrosis. Decreased corticomedullary differentiation though assessment is limited by body habitus. Left Kidney: Renal measurements: 12.0 x 6.8 x 6.6 cm = volume: 279 mL. No hydronephrosis. Decreased corticomedullary differentiation suggested again body habitus limiting assessment. Bladder: Urinary bladder decompressed. The subtle findings of echogenicity  in the area suggest correlation with Foley catheter which is in place. Other: Signs of disease with nodular hepatic contours and fissural widening. Marked gallbladder wall thickening, seen without signs of distension. No pericholecystic fluid. IMPRESSION: 1. Decreased corticomedullary differentiation, no hydronephrosis. 2. Signs of liver disease with nodular hepatic contour. Correlate with any clinical or laboratory evidence of liver disease. 3. Marked gallbladder wall thickening without distension, a finding that can be seen in the setting of hepatic or cardiac dysfunction. This finding can also be seen in acute hepatitis. Correlate with any RIGHT upper quadrant pain. HIDA scan could be helpful if there is clinical concern for acute gallbladder pathology. Electronically Signed   By: Zetta Bills M.D.   On: 09/15/2019 18:10      Scheduled Meds: . sodium chloride   Intravenous Once  . Chlorhexidine Gluconate Cloth  6 each Topical Daily  . feeding supplement  1 Container Oral TID BM  . ferrous sulfate  325 mg Oral BID WC  . insulin aspart  0-15 Units Subcutaneous TID WC  . insulin aspart  0-5 Units Subcutaneous QHS  . insulin aspart  4 Units Subcutaneous TID WC  . insulin glargine  40 Units Subcutaneous QHS  . pregabalin  150 mg Oral QHS   Continuous Infusions: . sodium chloride 75 mL/hr at 09/16/19 0003  . piperacillin-tazobactam (ZOSYN)  IV 3.375 g (09/16/19 9480)     LOS: 3 days      Time spent: 35 minutes   Dessa Phi, DO [facility] 09/16/2019, 11:31 AM   Available via Epic secure chat 7am-7pm After these hours, please refer to coverage provider listed on [email or web address]

## 2019-09-16 NOTE — Progress Notes (Addendum)
[facility] Progress Note  Subjective: creat continues to rise, pt seen in room, sleepy, no n/v or confusion. Low UOP 175 yest.    Vitals:   09/16/19 0400 09/16/19 0500 09/16/19 0807 09/16/19 1220  BP: 123/64  (!) 158/80 120/65  Pulse:    70  Resp: 16  13 20   Temp:   98.8 F (37.1 C) 98.2 F (36.8 C)  TempSrc:   Oral   SpO2: 98%   95%  Weight:  (!) 141.4 kg    Height:        Exam: Gen pleasant WF, no distress No rash, cyanosis or gangrene Sclera anicteric, throat clear  No jvd or bruits, flat neck veins Chest some crackles R base, L clear RRR no MRG Abd soft ntnd no mass or ascites +bs obese GU defer MS no joint effusions or deformity Ext 2-3+ hip/ leg edema,  L foot sig gangrenous changes Neuro is alert, Ox 3 , nf, no asterixis    Home meds:  - clonidine 0.2 bid/ crestor 20/ lasix 20 bid prn/ lisinopril-hctz 20-12.5 qd  - suboxone 8/2 two films qd/ zanaflex 4 tid/ lyrica 200 tid/ klonopin 0.5 bid prn  - insulin aspart 20u tid ac/ lantus 80u hs  - megace 40 tid prn  - prn's/ vitamins/ supplements    UA 9/9 - >50 wbc, 11-20 epi, 0-5 rbc, many bact, >300 prot, turbid   CXR 9/9 - IMPRESSION: Lungs clear. Heart borderline enlarged. No adenopathy.      Assessment/ Plan: 1. AoCKD 3b - b/l creat 1.7 from 10d ago, eGFR 35 ml/min.  Creat here 2.7 >> 3.5 today, here for gangrenous foot. No shock, no contrast/ acei/ nsaids. Had rec'd 3.5 gm IV vanc, unlikely to cause AKI. DC'd IV vanc. Poor UOP and rising despite continued IVF"s. Vol overloaded. Consider AKI d/t CHF/ vol excess vs infectious GN from her chronic (> 21mo) and severe foot infection.  +proteinuria > 300, UPC ratio ordered. DC IVF"s, trial IV lasix.  I think her foot infection could be affecting her renal function, would recommend surgery sooner than later. 2. Hyerkalemia - K 6.2, ekg wnl this am.  Received IV ins/ glu and po lokelma this am, repeat K+ due around 2p.   3. Gangrene/ osteo L foot  - IV abx and amputation planned 4. IDDM 5. Bipolar d/o 6. HTN - home clonidine/ lisinopril/ hctz are on hold appropriately.  7. Obesity       Rob Lyfe Monger 09/16/2019, 12:22 PM   Recent Labs  Lab 09/15/19 0332 09/15/19 0332 09/15/19 1830 09/16/19 0700 09/16/19 0741  K 4.6   < >  --  6.2* 6.2*  BUN 38*  --   --  47*  --   CREATININE 3.68*  --   --  4.51*  --   CALCIUM 8.1*  --   --  8.5*  --   HGB 6.2*   < > 8.7* 8.2*  --    < > = values in this interval not displayed.   Inpatient medications: . sodium chloride   Intravenous Once  . Chlorhexidine Gluconate Cloth  6 each Topical Daily  . feeding supplement  1 Container Oral TID BM  . ferrous sulfate  325 mg Oral BID WC  . insulin aspart  0-15 Units Subcutaneous TID WC  . insulin aspart  0-5 Units Subcutaneous QHS  . insulin aspart  4 Units Subcutaneous TID WC  . insulin glargine  40 Units Subcutaneous QHS  .  pregabalin  150 mg Oral QHS  . sodium polystyrene  45 g Oral Once   . sodium chloride 75 mL/hr at 09/16/19 0003  . piperacillin-tazobactam (ZOSYN)  IV 3.375 g (09/16/19 3414)   acetaminophen **OR** acetaminophen, albuterol, bismuth subsalicylate, clonazePAM, diclofenac sodium, hydrALAZINE, hydrOXYzine, oxyCODONE-acetaminophen

## 2019-09-16 NOTE — Progress Notes (Signed)
  Echocardiogram 2D Echocardiogram has been performed.  Felicia Reynolds 09/16/2019, 3:13 PM

## 2019-09-16 NOTE — TOC Progression Note (Signed)
Transition of Care [facility]) - Progression Note    Patient Details  Name: Felicia Reynolds MRN: [id number] Date of Birth: 1980-06-26  Transition of Care [facility]) CM/SW [place], LCSW Phone Number: 09/16/2019, 11:48 AM  Clinical Narrative: CSW followed-up with patient after discussion yesterday. Patient declined to have [employer] take a statement and reported she had already spoke with [employer] regarding this-- in contradiction to her reports yesterday. Please reach out to [facility] for further discharge supports.     Expected Discharge Plan:  (Crisis shelter) Barriers to Discharge: Continued Medical Work up  Expected Discharge Plan and Services Expected Discharge Plan:  (Crisis shelter)                                               Social Determinants of Health (SDOH) Interventions    Readmission Risk Interventions No flowsheet data found.

## 2019-09-17 ENCOUNTER — Inpatient Hospital Stay (HOSPITAL_COMMUNITY): Payer: Medicaid Other

## 2019-09-17 ENCOUNTER — Encounter (HOSPITAL_COMMUNITY)
Admission: EM | Discharge status: Against Medical Advice | Payer: Self-pay | Source: Home / Self Care | Attending: Internal Medicine

## 2019-09-17 DIAGNOSIS — L97522 Non-pressure chronic ulcer of other part of left foot with fat layer exposed: Secondary | ICD-10-CM

## 2019-09-17 DIAGNOSIS — M86072 Acute hematogenous osteomyelitis, left ankle and foot: Secondary | ICD-10-CM

## 2019-09-17 DIAGNOSIS — M869 Osteomyelitis, unspecified: Secondary | ICD-10-CM

## 2019-09-17 HISTORY — PX: AMPUTATION: SHX166

## 2019-09-17 LAB — HEPATIC FUNCTION PANEL
ALT: 15 U/L (ref 0–44)
AST: 23 U/L (ref 15–41)
Albumin: 1.9 g/dL — ABNORMAL LOW (ref 3.5–5.0)
Alkaline Phosphatase: 616 U/L — ABNORMAL HIGH (ref 38–126)
Bilirubin, Direct: 1.3 mg/dL — ABNORMAL HIGH (ref 0.0–0.2)
Indirect Bilirubin: 0.8 mg/dL (ref 0.3–0.9)
Total Bilirubin: 2.1 mg/dL — ABNORMAL HIGH (ref 0.3–1.2)
Total Protein: 6.3 g/dL — ABNORMAL LOW (ref 6.5–8.1)

## 2019-09-17 LAB — CBC
HCT: 26.5 % — ABNORMAL LOW (ref 36.0–46.0)
Hemoglobin: 8.4 g/dL — ABNORMAL LOW (ref 12.0–15.0)
MCH: 26.1 pg (ref 26.0–34.0)
MCHC: 31.7 g/dL (ref 30.0–36.0)
MCV: 82.3 fL (ref 80.0–100.0)
Platelets: 186 10*3/uL (ref 150–400)
RBC: 3.22 MIL/uL — ABNORMAL LOW (ref 3.87–5.11)
RDW: 19.6 % — ABNORMAL HIGH (ref 11.5–15.5)
WBC: 8.5 10*3/uL (ref 4.0–10.5)
nRBC: 0 % (ref 0.0–0.2)

## 2019-09-17 LAB — GLUCOSE, CAPILLARY
Glucose-Capillary: 101 mg/dL — ABNORMAL HIGH (ref 70–99)
Glucose-Capillary: 80 mg/dL (ref 70–99)
Glucose-Capillary: 67 mg/dL — ABNORMAL LOW (ref 70–99)
Glucose-Capillary: 88 mg/dL (ref 70–99)
Glucose-Capillary: 75 mg/dL (ref 70–99)
Glucose-Capillary: 79 mg/dL (ref 70–99)
Glucose-Capillary: 98 mg/dL (ref 70–99)
Glucose-Capillary: 123 mg/dL — ABNORMAL HIGH (ref 70–99)

## 2019-09-17 LAB — PATHOLOGIST SMEAR REVIEW

## 2019-09-17 LAB — BASIC METABOLIC PANEL
Anion gap: 14 (ref 5–15)
BUN: 51 mg/dL — ABNORMAL HIGH (ref 6–20)
CO2: 17 mmol/L — ABNORMAL LOW (ref 22–32)
Calcium: 8.4 mg/dL — ABNORMAL LOW (ref 8.9–10.3)
Chloride: 102 mmol/L (ref 98–111)
Creatinine, Ser: 5.33 mg/dL — ABNORMAL HIGH (ref 0.44–1.00)
GFR calc Af Amer: 11 mL/min — ABNORMAL LOW (ref >60)
GFR calc non Af Amer: 9 mL/min — ABNORMAL LOW (ref >60)
Glucose, Bld: 110 mg/dL — ABNORMAL HIGH (ref 70–99)
Potassium: 5.2 mmol/L — ABNORMAL HIGH (ref 3.5–5.1)
Sodium: 133 mmol/L — ABNORMAL LOW (ref 135–145)

## 2019-09-17 SURGERY — AMPUTATION, FOOT, RAY
Anesthesia: General | Site: Toe | Laterality: Left

## 2019-09-17 MED ORDER — SODIUM CHLORIDE 0.9 % IV SOLN: INTRAVENOUS | Status: DC

## 2019-09-17 MED ORDER — EPINEPHRINE 1 MG/10ML IJ SOSY
PREFILLED_SYRINGE | INTRAMUSCULAR | Status: AC
  Filled 2019-09-17: qty 10

## 2019-09-17 MED ORDER — DEXTROSE 50 % IV SOLN
INTRAVENOUS | Status: AC
  Filled 2019-09-17: qty 50

## 2019-09-17 MED ORDER — SODIUM CHLORIDE (PF) 0.9 % IJ SOLN
INTRAMUSCULAR | Status: AC
  Filled 2019-09-17: qty 10

## 2019-09-17 MED ORDER — POVIDONE-IODINE 10 % EX SWAB: 2.0000 "application " | Freq: Once | CUTANEOUS | Status: DC

## 2019-09-17 MED ORDER — FENTANYL CITRATE (PF) 250 MCG/5ML IJ SOLN
INTRAMUSCULAR | Status: AC
  Filled 2019-09-17: qty 5

## 2019-09-17 MED ORDER — LIDOCAINE 2% (20 MG/ML) 5 ML SYRINGE
INTRAMUSCULAR | Status: AC
  Filled 2019-09-17: qty 5

## 2019-09-17 MED ORDER — PHENYLEPHRINE HCL (PRESSORS) 10 MG/ML IV SOLN
INTRAVENOUS | Status: DC | PRN
  Administered 2019-09-17: 120 ug via INTRAVENOUS
  Administered 2019-09-17: 80 ug via INTRAVENOUS

## 2019-09-17 MED ORDER — MUPIROCIN 2 % EX OINT
1.0000 "application " | TOPICAL_OINTMENT | Freq: Two times a day (BID) | CUTANEOUS | Status: AC
  Administered 2019-09-17 – 2019-09-22 (×10): 1 via NASAL
  Filled 2019-09-17 (×4): qty 22

## 2019-09-17 MED ORDER — SODIUM CHLORIDE 0.9 % IR SOLN
Status: DC | PRN
  Administered 2019-09-17: 3000 mL

## 2019-09-17 MED ORDER — PIPERACILLIN-TAZOBACTAM 3.375 G IVPB
3.3750 g | Freq: Two times a day (BID) | INTRAVENOUS | Status: DC
  Administered 2019-09-17 – 2019-09-20 (×7): 3.375 g via INTRAVENOUS
  Filled 2019-09-17 (×6): qty 50

## 2019-09-17 MED ORDER — DEXAMETHASONE SODIUM PHOSPHATE 10 MG/ML IJ SOLN
INTRAMUSCULAR | Status: DC | PRN
  Administered 2019-09-17: 5 mg via INTRAVENOUS

## 2019-09-17 MED ORDER — ONDANSETRON HCL 4 MG/2ML IJ SOLN
INTRAMUSCULAR | Status: DC | PRN
  Administered 2019-09-17: 4 mg via INTRAVENOUS

## 2019-09-17 MED ORDER — PROPOFOL 10 MG/ML IV BOLUS
INTRAVENOUS | Status: DC | PRN
  Administered 2019-09-17: 200 mg via INTRAVENOUS

## 2019-09-17 MED ORDER — CHLORHEXIDINE GLUCONATE 0.12 % MT SOLN
OROMUCOSAL | Status: AC
  Administered 2019-09-17: 15 mL via OROMUCOSAL
  Filled 2019-09-17: qty 15

## 2019-09-17 MED ORDER — CHLORHEXIDINE GLUCONATE 0.12 % MT SOLN
15.0000 mL | Freq: Once | OROMUCOSAL | Status: AC
  Filled 2019-09-17: qty 15

## 2019-09-17 MED ORDER — PROPOFOL 10 MG/ML IV BOLUS
INTRAVENOUS | Status: AC
  Filled 2019-09-17: qty 40

## 2019-09-17 MED ORDER — SODIUM BICARBONATE 8.4 % IV SOLN
50.0000 meq | Freq: Once | INTRAVENOUS | Status: AC
  Administered 2019-09-17: 50 meq via INTRAVENOUS
  Filled 2019-09-17: qty 50

## 2019-09-17 MED ORDER — DEXTROSE 50 % IV SOLN
INTRAVENOUS | Status: DC | PRN
  Administered 2019-09-17: .25 via INTRAVENOUS

## 2019-09-17 MED ORDER — ONDANSETRON HCL 4 MG/2ML IJ SOLN
INTRAMUSCULAR | Status: AC
  Filled 2019-09-17: qty 2

## 2019-09-17 MED ORDER — EPHEDRINE 5 MG/ML INJ
INTRAVENOUS | Status: AC
  Filled 2019-09-17: qty 10

## 2019-09-17 MED ORDER — LIDOCAINE 2% (20 MG/ML) 5 ML SYRINGE
INTRAMUSCULAR | Status: DC | PRN
  Administered 2019-09-17: 40 mg via INTRAVENOUS

## 2019-09-17 MED ORDER — MIDAZOLAM HCL 2 MG/2ML IJ SOLN
INTRAMUSCULAR | Status: AC
  Filled 2019-09-17: qty 2

## 2019-09-17 MED ORDER — CHLORHEXIDINE GLUCONATE 4 % EX LIQD: 60.0000 mL | Freq: Once | CUTANEOUS | Status: DC

## 2019-09-17 MED ORDER — FUROSEMIDE 10 MG/ML IJ SOLN
120.0000 mg | Freq: Two times a day (BID) | INTRAVENOUS | Status: DC
  Administered 2019-09-17: 120 mg via INTRAVENOUS
  Filled 2019-09-17 (×3): qty 12

## 2019-09-17 MED ORDER — DEXTROSE 5 % IV SOLN
3.0000 g | INTRAVENOUS | Status: AC
  Administered 2019-09-17: 3 g via INTRAVENOUS
  Filled 2019-09-17: qty 3

## 2019-09-17 MED ORDER — DEXAMETHASONE SODIUM PHOSPHATE 10 MG/ML IJ SOLN
INTRAMUSCULAR | Status: AC
  Filled 2019-09-17: qty 1

## 2019-09-17 SURGICAL SUPPLY — 56 items
BLADE AVERAGE 25MMX9MM (BLADE) ×1
BLADE AVERAGE 25X9 (BLADE) ×2 IMPLANT
BNDG CMPR 75X41 PLY HI ABS (GAUZE/BANDAGES/DRESSINGS) ×1
BNDG ELASTIC 3X5.8 VLCR STR LF (GAUZE/BANDAGES/DRESSINGS) ×2 IMPLANT
BNDG ELASTIC 4X5.8 VLCR STR LF (GAUZE/BANDAGES/DRESSINGS) ×2 IMPLANT
BNDG STRETCH 4X75 STRL LF (GAUZE/BANDAGES/DRESSINGS) ×3 IMPLANT
CANISTER WOUNDNEG PRESSURE 500 (CANNISTER) ×2 IMPLANT
CNTNR URN SCR LID CUP LEK RST (MISCELLANEOUS) IMPLANT
CONT SPEC 4OZ STRL OR WHT (MISCELLANEOUS) ×3
COVER LIGHT HANDLE STERIS (MISCELLANEOUS) ×3 IMPLANT
COVER SURGICAL LIGHT HANDLE (MISCELLANEOUS) ×3 IMPLANT
COVER WAND RF STERILE (DRAPES) ×3 IMPLANT
CUFF TOURN SGL QUICK 24 (TOURNIQUET CUFF) ×3
CUFF TOURN SGL QUICK 34 (TOURNIQUET CUFF) ×3
CUFF TRNQT CYL 24X4X16.5-23 (TOURNIQUET CUFF) ×1 IMPLANT
CUFF TRNQT CYL 34X4.125X (TOURNIQUET CUFF) IMPLANT
DRAPE INCISE IOBAN 66X45 STRL (DRAPES) ×2 IMPLANT
DRSG EMULSION OIL 3X3 NADH (GAUZE/BANDAGES/DRESSINGS) ×3 IMPLANT
DRSG VAC ATS LRG SENSATRAC (GAUZE/BANDAGES/DRESSINGS) ×2 IMPLANT
DRSG VAC ATS SM SENSATRAC (GAUZE/BANDAGES/DRESSINGS) ×2 IMPLANT
DURAPREP 26ML APPLICATOR (WOUND CARE) ×3 IMPLANT
ELECT CAUTERY BLADE 6.4 (BLADE) IMPLANT
ELECT REM PT RETURN 9FT ADLT (ELECTROSURGICAL) ×3
ELECTRODE REM PT RTRN 9FT ADLT (ELECTROSURGICAL) ×1 IMPLANT
FACESHIELD WRAPAROUND (MASK) IMPLANT
FACESHIELD WRAPAROUND OR TEAM (MASK) IMPLANT
GAUZE SPONGE 4X4 12PLY STRL (GAUZE/BANDAGES/DRESSINGS) ×3 IMPLANT
GLOVE BIOGEL PI IND STRL 7.0 (GLOVE) ×1 IMPLANT
GLOVE BIOGEL PI INDICATOR 7.0 (GLOVE) ×2
GLOVE ECLIPSE 7.0 STRL STRAW (GLOVE) ×3 IMPLANT
GLOVE SKINSENSE NS SZ7.5 (GLOVE) ×6
GLOVE SKINSENSE STRL SZ7.5 (GLOVE) ×3 IMPLANT
GLOVE SURG SYN 7.5  E (GLOVE) ×3
GLOVE SURG SYN 7.5 E (GLOVE) ×1 IMPLANT
GLOVE SURG SYN 7.5 PF PI (GLOVE) ×1 IMPLANT
GOWN STRL REIN XL XLG (GOWN DISPOSABLE) ×3 IMPLANT
KIT BASIN OR (CUSTOM PROCEDURE TRAY) ×3 IMPLANT
KIT TURNOVER KIT B (KITS) ×3 IMPLANT
MANIFOLD NEPTUNE II (INSTRUMENTS) ×3 IMPLANT
NS IRRIG 1000ML POUR BTL (IV SOLUTION) ×3 IMPLANT
PACK ORTHO EXTREMITY (CUSTOM PROCEDURE TRAY) ×3 IMPLANT
PAD ARMBOARD 7.5X6 YLW CONV (MISCELLANEOUS) ×6 IMPLANT
SET CYSTO W/LG BORE CLAMP LF (SET/KITS/TRAYS/PACK) ×3 IMPLANT
SPONGE LAP 18X18 RF (DISPOSABLE) ×3 IMPLANT
SUCTION FRAZIER HANDLE 10FR (MISCELLANEOUS) ×3
SUCTION TUBE FRAZIER 10FR DISP (MISCELLANEOUS) ×1 IMPLANT
SUT ETHILON 2 0 PSLX (SUTURE) ×6 IMPLANT
SUT PDS AB 0 CT 36 (SUTURE) IMPLANT
SWAB COLLECTION DEVICE MRSA (MISCELLANEOUS) ×2 IMPLANT
SWAB CULTURE ESWAB REG 1ML (MISCELLANEOUS) ×2 IMPLANT
TOWEL GREEN STERILE (TOWEL DISPOSABLE) ×3 IMPLANT
TOWEL GREEN STERILE FF (TOWEL DISPOSABLE) IMPLANT
TUBE CONNECTING 12'X1/4 (SUCTIONS) ×1
TUBE CONNECTING 12X1/4 (SUCTIONS) ×2 IMPLANT
UNDERPAD 30X36 HEAVY ABSORB (UNDERPADS AND DIAPERS) ×3 IMPLANT
WATER STERILE IRR 1000ML POUR (IV SOLUTION) ×3 IMPLANT

## 2019-09-17 NOTE — Op Note (Addendum)
° °  Date of Surgery: 09/17/2019  INDICATIONS: Felicia Reynolds is a 39 y.o.-year-old female with a left foot infection and osteomyelitis of great toe.  Emergency consent was utilized given the urgent nature and the patient's altered mental status.  PREOPERATIVE DIAGNOSIS: Severe soft tissue infection of left foot and left great toe osteomyelitis  POSTOPERATIVE DIAGNOSIS: Same.  PROCEDURE:  1.  Excisional debridement of left foot skin, subcutaneous tissue, muscle, tendon, bone 85 cm 2.  Left foot first ray amputation 3.  Application of wound VAC greater than 50 cm  SURGEON: N. Eduard Roux, M.D.  ASSIST: Ciro Backer [place], [place]; necessary for the timely completion of procedure and due to complexity of procedure.  ANESTHESIA:  general  IV FLUIDS AND URINE: See anesthesia.  ESTIMATED BLOOD LOSS: Minimal mL.  IMPLANTS: None  DRAINS: Wound VAC  COMPLICATIONS: see description of procedure.  DESCRIPTION OF PROCEDURE: The patient was brought to the operating room.  The patient had been signed prior to the procedure and this was documented. The patient had the anesthesia placed by the anesthesiologist.  A time-out was performed to confirm that this was the correct patient, site, side and location. The patient did receive antibiotics prior to the incision and was re-dosed during the procedure as needed at indicated intervals.  A tourniquet was placed.  The patient had the operative extremity prepped and draped in the standard surgical fashion.    I first began by using a 10 blade to perform excisional debridement of necrotic skin and the underlying subcutaneous tissue and muscle and tendon and bone.  Altogether I debrided approximately 85 cm.  After thorough debridement I decided that a first ray amputation was necessary in order to give the wound a chance to heal given the large amount of soft tissue loss therefore an oscillating saw was used to perform an osteotomy atthe proximal shaft of the  first metatarsal.  The entire wound was then thoroughly irrigated with normal saline.  Hemostasis was obtained after deflation of tourniquet.  Primary closure was not indicated therefore a wound VAC was placed.  Ace bandage was lightly wrapped.  Patient tolerated procedure well had no immediate complications.  POSTOPERATIVE PLAN: Patient will need to return back to the operating room for repeat washout in a couple of days.  Dr. Sharol Given has graciously agreed to take over care for this patient tomorrow.  [name], MD 4:53 PM

## 2019-09-17 NOTE — Anesthesia Postprocedure Evaluation (Signed)
Anesthesia Post Note  Patient: Felicia Reynolds  Procedure(s) Performed: AMPUTATION LEFT GREAT TOE/ RAY WITH WOUND VAC APPLICATION (Left Toe)     Patient location during evaluation: PACU Anesthesia Type: General Level of consciousness: awake and alert, patient cooperative and oriented Pain management: pain level controlled Vital Signs Assessment: post-procedure vital signs reviewed and stable Respiratory status: spontaneous breathing, nonlabored ventilation and respiratory function stable Cardiovascular status: blood pressure returned to baseline and stable Postop Assessment: no apparent nausea or vomiting Anesthetic complications: no   No complications documented.  Last Vitals:  Vitals:   09/17/19 1800 09/17/19 1840  BP: 134/63 (!) 148/61  Pulse: 71 81  Resp: 12 14  Temp:  36.9 C  SpO2: 91% (!) 87%    Last Pain:  Vitals:   09/17/19 1840  TempSrc: Oral  PainSc:                  Keita Demarco,E. Reiana Poteet

## 2019-09-17 NOTE — Progress Notes (Signed)
Spoke to Dr. Erlinda Hong concerning patients consent. Because of patients altered mental status and family situation, he decided to declare surgery an emergency and no signed consent needed.

## 2019-09-17 NOTE — Progress Notes (Signed)
I have discussed case with Dr. Stann Mainland and I will take the patient to surgery this afternoon for left foot I&D and likely great toe amp vs 1st ray amp with primary closure vs VAC closure.  Continue NPO.  [name], MD [facility] [phone] 7:08 AM

## 2019-09-17 NOTE — Transfer of Care (Signed)
Immediate Anesthesia Transfer of Care Note  Patient: Felicia Reynolds  Procedure(s) Performed: AMPUTATION RAY WITH POSSIBLE WOUND VAC APPLICATION (Left )  Patient Location: PACU  Anesthesia Type:General  Level of Consciousness: drowsy and patient cooperative  Airway & Oxygen Therapy: Patient Spontanous Breathing and Patient connected to face mask oxygen  Post-op Assessment: Report given to RN and Post -op Vital signs reviewed and stable  Post vital signs: Reviewed and stable  Last Vitals:  Vitals Value Taken Time  BP 152/70 09/17/19 1621  Temp    Pulse 83 09/17/19 1626  Resp 14 09/17/19 1626  SpO2 100 % 09/17/19 1626  Vitals shown include unvalidated device data.  Last Pain:  Vitals:   09/17/19 1620  TempSrc:   PainSc: Asleep      Patients Stated Pain Goal: 5 (11/03/57 4585)  Complications: No complications documented.

## 2019-09-17 NOTE — Anesthesia Preprocedure Evaluation (Addendum)
Anesthesia Evaluation  Patient identified by MRN, date of birth, ID band Patient awake and Patient confused    Reviewed: Allergy & Precautions, NPO status , Patient's Chart, lab work & pertinent test resultsPreop documentation limited or incomplete due to emergent nature of procedure.  History of Anesthesia Complications Negative for: history of anesthetic complications  Airway Mallampati: I  TM Distance: >3 FB Neck ROM: Full    Dental  (+) Poor Dentition, Missing, Chipped, Dental Advisory Given   Pulmonary Current Smoker and Patient abstained from smoking.,  09/13/2019 SARS coronavirus NEG   + rhonchi        Cardiovascular hypertension, Pt. on medications  Rhythm:Regular Rate:Normal  09/16/2019 ECHO: EF 45-50%. LV has mildly decreased function, global hypokinesis, and is mildly dilated. No significant valvular disease   Neuro/Psych Anxiety Bipolar Disorder confused   GI/Hepatic negative GI ROS, (+)     substance abuse (on suboxone)  , Elevated LFTs   Endo/Other  diabetes (glu 75), Insulin DependentMorbid obesity  Renal/GU ARFRenal disease     Musculoskeletal  (+) Fibromyalgia -  Abdominal (+) + obese,   Peds  Hematology INR 1.3   Anesthesia Other Findings   Reproductive/Obstetrics                            Anesthesia Physical Anesthesia Plan  ASA: III  Anesthesia Plan: General   Post-op Pain Management:    Induction: Intravenous  PONV Risk Score and Plan: 2 and Ondansetron and Dexamethasone  Airway Management Planned: LMA  Additional Equipment:   Intra-op Plan:   Post-operative Plan:   Informed Consent: I have reviewed the patients History and Physical, chart, labs and discussed the procedure including the risks, benefits and alternatives for the proposed anesthesia with the patient or authorized representative who has indicated his/her understanding and acceptance.      Dental advisory given  Plan Discussed with: CRNA and Surgeon  Anesthesia Plan Comments: (Pt refuses to have other family members contacted, Dr. Erlinda Hong confirms emergent procedure )       Anesthesia Quick Evaluation

## 2019-09-17 NOTE — Progress Notes (Signed)
Report given to short stay RN, Sammy. Pre-procedure checklist completed, CHG bath given, and blood sugar taken prior to pt leaving unit. CBG was 67, pt given 12.5 mg of D50 per hypoglycemia standing orders. RN in short stay agrees to recheck upon arrival. Pt's cognition still in question, and short stay RN made aware. Unsigned consent sent with pt's chart.   Justice Rocher, RN

## 2019-09-17 NOTE — Anesthesia Procedure Notes (Addendum)
Procedure Name: LMA Insertion Date/Time: 09/17/2019 3:28 PM Performed by: Cleda Daub, CRNA Pre-anesthesia Checklist: Patient identified, Emergency Drugs available, Suction available, Patient being monitored and Timeout performed Patient Re-evaluated:Patient Re-evaluated prior to induction Oxygen Delivery Method: Circle system utilized Preoxygenation: Pre-oxygenation with 100% oxygen Induction Type: IV induction Ventilation: Mask ventilation without difficulty LMA: LMA inserted LMA Size: 5.0 Number of attempts: 1 Tube secured with: Tape Comments: Placed by Dyann Ruddle

## 2019-09-17 NOTE — Progress Notes (Signed)
Pt NPO since midnight for amputation today. Dr. Maylene Roes consulted and gave orders to proceed with giving Lokelma. Other NPO medications held.    Justice Rocher, RN

## 2019-09-17 NOTE — Progress Notes (Signed)
Pt tearful and admits to being confused. She states that she does not want anyone told about her condition or operation except for her friend Sonia Baller. Pt also states that she thinks that one of her family members is trying to kill her, which is why she doesn't want anyone to know about what is going on with her. When asked about her operation this afternoon, pt said that it was on her heart, but then after being told said "Oh yeah I mean my toe." Informed consent filled out by RN, but not signed by pt because ability to sign for herself is reasonably questioned.   Justice Rocher, RN

## 2019-09-17 NOTE — Progress Notes (Signed)
Patient is confused and has AMS due to severe left foot infection.  Emergency consent will be enacted.  [name], MD [facility] [phone] 2:54 PM

## 2019-09-17 NOTE — Progress Notes (Signed)
PROGRESS NOTE    Felicia Reynolds  ACZ:[id number] DOB: 26-Jun-1980 DOA: 09/13/2019 PCP: [facility]     Brief Narrative:  Felicia Reynolds is a 39 y.o. female with medical history significant of IDDM with insulin resistance, diabetic neuropathy, hypertension, CKD stage III, chronic iron deficiency anemia secondary to menorrhagia, Suboxone therapy, presented with worsening of left toe/foot infection.  10 days ago, patient stepped on piece of mirror which cut open her left toe and distal left foot. She did not notice until few hours later because of the diabetic neuropathy.  2 days later she started to feel pain which gradually getting worse, she went to her PCP and received a few days of p.o. Augmentin.  For the last week, she developed worsening of pain, today pain is 10/10, constant, and swelling and purulent discharge with foul smell.  Also had subjective fever and chills at home.  Patient also reported she has separated with her spouse and has been living in the garage since last week.  Meanwhile she ran out of her insulin and Suboxone for about 7 days.  She has not been able to stand up and put weight on the left foot because of the severe pain, and she reported hard time to transfer herself out of bed. In the ED, patient was found to be tachycardia, blood pressure borderline low, fever of 103.  WBC 14.6, worsening of creatinine level from baseline 1.6 to now 2.59.  Left foot x-ray showed soft tissue air tracking along the fourth digit extending from first distal phalanx to proximal fourth metatarsal consistent with soft tissue infection concern for bony destruction of the first proximal phalanx and fracture of the first distal phalanx.  Orthopedic surgery was consulted.  Patient was started on IV Zosyn and vancomycin.  Due to continued worsening of her kidney function, nephrology was consulted.  Vancomycin was stopped.  New events last 24 hours / Subjective: No new physical complaints,  denies any chest pain, shortness of breath, nausea, vomiting or abdominal pain.  Awaiting surgical intervention planned for this afternoon.  Assessment & Plan:   Active Problems:   Osteomyelitis (HCC)   Sepsis ([place])   Severe sepsis secondary to osteomyelitis of the left great toe and UTI, present on admission -Presented with fever 103.2, tachycardia, leukocytosis, endorgan damage of AKI -Foot x-ray revealed soft tissue air tracking along the first digit extending from the first distal phalanx to the proximal first metatarsal consistent with soft tissue infection. Concern for bony destruction along the distal aspect of the first proximal phalanx. Fracture along the lateral aspect of the proximal portion of the first distal phalanx. -ABI 0.94 on the left -Blood cultures negative to date -Urine culture with multiple species -Continue Zosyn -Appreciate orthopedic surgery -planning for amputation today 9/13  AKI on CKD stage IIIb -Baseline creatinine 1.7 -Avoid nephrotoxins -Renal ultrasound showed decreased corticomedullary differentiation, no hydronephrosis -Continue IV fluid, IV Lasix -Nephrology following  Hyperkalemia -IV lasix, lokelma -Trend K  Acute on chronic iron deficiency anemia, chronic blood loss from menorrhagia -Transfused total 3 unit packed red blood cell  -Ferrous sulfate. Avoid IV iron in acute infectious process -Patient admits to menorrhagia, no blood from stool reported. FOBT pending -Hemolysis labs negative -Hemoglobin remains stable  Uncontrolled diabetes with hyperglycemia and neuropathy -Hemoglobin A1c 9.0 -Continue Lantus, mealtime NovoLog, sliding scale insulin  Abnormal liver on ultrasound -LFTs normal, patient denies any history of alcohol abuse.  She denies any right upper quadrant abdominal pain -Alk phosphatase  is elevated, GGT elevated  -Hepatitis panel negative -Will need further outpatient work up once acute illness resolves   Morbid  obesity Estimated body mass index is 49.88 kg/m as calculated from the following:   Height as of this encounter: 5' 6.5" (1.689 m).   Weight as of this encounter: 142.3 kg.      DVT prophylaxis:  Place and maintain sequential compression device Start: 09/15/19 0718  Code Status: Full code Family Communication: No family at bedside Disposition Plan:   Status is: Inpatient  Remains inpatient appropriate because:Persistent severe electrolyte disturbances, Ongoing diagnostic testing needed not appropriate for outpatient work up, IV treatments appropriate due to intensity of illness or inability to take PO and Inpatient level of care appropriate due to severity of illness   Dispo: The patient is from: Home              Anticipated d/c is to: Home              Anticipated d/c date is: > 3 days              Patient currently is not medically stable to d/c.  Remains on IV antibiotics. Remains on IV fluid.  Planning for amputation today 9/13   Consultants:   Orthopedic surgery  Nephrology  Procedures:   None  Antimicrobials:  Anti-infectives (From admission, onward)   Start     Dose/Rate Route Frequency Ordered Stop   09/14/19 1300  vancomycin (VANCOREADY) IVPB 1250 mg/250 mL  Status:  Discontinued        1,250 mg 166.7 mL/hr over 90 Minutes Intravenous Every 24 hours 09/13/19 1215 09/15/19 1130   09/13/19 2200  piperacillin-tazobactam (ZOSYN) IVPB 3.375 g  Status:  Discontinued        3.375 g 12.5 mL/hr over 240 Minutes Intravenous Every 8 hours 09/13/19 1215 09/13/19 1519   09/13/19 1730  piperacillin-tazobactam (ZOSYN) IVPB 3.375 g        3.375 g 12.5 mL/hr over 240 Minutes Intravenous Every 8 hours 09/13/19 1519     09/13/19 1230  vancomycin (VANCOREADY) IVPB 2000 mg/400 mL        2,000 mg 200 mL/hr over 120 Minutes Intravenous  Once 09/13/19 1215 09/13/19 1635   09/13/19 1230  piperacillin-tazobactam (ZOSYN) IVPB 3.375 g        3.375 g 100 mL/hr over 30 Minutes  Intravenous  Once 09/13/19 1215 09/13/19 1350   09/13/19 1215  vancomycin (VANCOCIN) IVPB 1000 mg/200 mL premix  Status:  Discontinued        1,000 mg 200 mL/hr over 60 Minutes Intravenous  Once 09/13/19 1208 09/13/19 1215       Objective: Vitals:   09/16/19 2017 09/16/19 2200 09/17/19 0400 09/17/19 0500  BP:  137/76 116/64   Pulse:  87 75   Resp:      Temp: 98.7 F (37.1 C)  98.5 F (36.9 C)   TempSrc: Oral  Oral   SpO2:  94% 95%   Weight:    (!) 142.3 kg  Height:        Intake/Output Summary (Last 24 hours) at 09/17/2019 1005 Last data filed at 09/16/2019 1700 Gross per 24 hour  Intake 146.32 ml  Output 300 ml  Net -153.68 ml   Filed Weights   09/15/19 1130 09/16/19 0500 09/17/19 0500  Weight: (!) 138 kg (!) 141.4 kg (!) 142.3 kg    Examination: General exam: Appears calm and comfortable  Respiratory system: Clear to auscultation. Respiratory  effort normal. Cardiovascular system: S1 & S2 heard, RRR. No pedal edema. Gastrointestinal system: Abdomen is nondistended, soft and nontender. Normal bowel sounds heard. Central nervous system: Alert and oriented. Non focal exam. Speech clear  Skin: Left great toe and first metatarsal plantar surface with necrosis, erythema and drainage Psychiatry: Stable  Data Reviewed: I have personally reviewed following labs and imaging studies  CBC: Recent Labs  Lab 09/13/19 1217 09/13/19 1228 09/14/19 0731 09/14/19 0731 09/14/19 1835 09/15/19 0332 09/15/19 1830 09/16/19 0700 09/17/19 0448  WBC 14.6*  --  8.7  --   --  6.4  --  7.6 8.5  NEUTROABS 12.8*  --   --   --   --   --   --   --   --   HGB 7.3*   < > 6.8*   < > 7.4* 6.2* 8.7* 8.2* 8.4*  HCT 23.9*   < > 22.9*   < > 24.0* 20.6* 27.6* 26.2* 26.5*  MCV 81.0  --  80.6  --   --  81.7  --  81.6 82.3  PLT 191  --  172  --   --  128*  --  156 186   < > = values in this interval not displayed.   Basic Metabolic Panel: Recent Labs  Lab 09/13/19 1217 09/13/19 1217  09/13/19 1228 09/13/19 1228 09/14/19 0731 09/14/19 0731 09/15/19 0332 09/16/19 0700 09/16/19 0741 09/16/19 1441 09/17/19 0448  NA 130*   < > 132*  --  133*  --  132* 134*  --   --  133*  K 4.0   < > 4.1   < > 4.4   < > 4.6 6.2* 6.2* 5.4* 5.2*  CL 102   < > 102  --  103  --  104 105  --   --  102  CO2 18*  --   --   --  19*  --  17* 18*  --   --  17*  GLUCOSE 289*   < > 291*  --  101*  --  159* 76  --   --  110*  BUN 27*   < > 26*  --  34*  --  38* 47*  --   --  51*  CREATININE 2.59*   < > 2.70*  --  3.17*  --  3.68* 4.51*  --   --  5.33*  CALCIUM 7.7*  --   --   --  8.2*  --  8.1* 8.5*  --   --  8.4*   < > = values in this interval not displayed.   GFR: Estimated Creatinine Clearance: 20.4 mL/min (A) (by C-G formula based on SCr of 5.33 mg/dL (H)). Liver Function Tests: Recent Labs  Lab 09/13/19 1217 09/15/19 1830 09/16/19 0741 09/17/19 0448  AST 15  --  31 23  ALT 13  --  15 15  ALKPHOS 158*  --  488* 616*  BILITOT 0.4 1.9* 2.1* 2.1*  PROT 5.5*  --  6.2* 6.3*  ALBUMIN 1.7*  --  2.2* 1.9*   No results for input(s): LIPASE, AMYLASE in the last 168 hours. No results for input(s): AMMONIA in the last 168 hours. Coagulation Profile: Recent Labs  Lab 09/13/19 1217 09/15/19 1830  INR 1.5* 1.3*   Cardiac Enzymes: No results for input(s): CKTOTAL, CKMB, CKMBINDEX, TROPONINI in the last 168 hours. BNP (last 3 results) No results for input(s): PROBNP in the last 8760 hours.  HbA1C: No results for input(s): HGBA1C in the last 72 hours. CBG: Recent Labs  Lab 09/15/19 2157 09/16/19 0805 09/16/19 1219 09/16/19 1605 09/16/19 2144  GLUCAP 89 77 118* 96 89   Lipid Profile: No results for input(s): CHOL, HDL, LDLCALC, TRIG, CHOLHDL, LDLDIRECT in the last 72 hours. Thyroid Function Tests: No results for input(s): TSH, T4TOTAL, FREET4, T3FREE, THYROIDAB in the last 72 hours. Anemia Panel: Recent Labs    09/15/19 1830  VITAMINB12 962*  FOLATE 20.8  FERRITIN 122    Sepsis Labs: Recent Labs  Lab 09/13/19 1204  LATICACIDVEN 1.7    Recent Results (from the past 240 hour(s))  Culture, blood (Routine x 2)     Status: None (Preliminary result)   Collection Time: 09/13/19 12:58 PM   Specimen: BLOOD  Result Value Ref Range Status   Specimen Description BLOOD SITE NOT SPECIFIED  Final   Special Requests   Final    BOTTLES DRAWN AEROBIC AND ANAEROBIC Blood Culture results may not be optimal due to an excessive volume of blood received in culture bottles   Culture   Final    NO GROWTH 3 DAYS Performed at [facility], [place] [address] [place], [place] [address]    Report Status PENDING  Incomplete  Culture, blood (Routine x 2)     Status: None (Preliminary result)   Collection Time: 09/13/19  1:08 PM   Specimen: BLOOD  Result Value Ref Range Status   Specimen Description BLOOD SITE NOT SPECIFIED  Final   Special Requests   Final    BOTTLES DRAWN AEROBIC AND ANAEROBIC Blood Culture adequate volume   Culture   Final    NO GROWTH 3 DAYS Performed at [facility], [address], [place], [place] [address]    Report Status PENDING  Incomplete  SARS Coronavirus 2 by RT PCR (hospital order, performed in [facility] hospital lab) Nasopharyngeal Nasopharyngeal Swab     Status: None   Collection Time: 09/13/19  2:02 PM   Specimen: Nasopharyngeal Swab  Result Value Ref Range Status   SARS Coronavirus 2 NEGATIVE NEGATIVE Final    Comment: (NOTE) SARS-CoV-2 target nucleic acids are NOT DETECTED.  The SARS-CoV-2 RNA is generally detectable in upper and lower respiratory specimens during the acute phase of infection. The lowest concentration of SARS-CoV-2 viral copies this assay can detect is 250 copies / mL. A negative result does not preclude SARS-CoV-2 infection and should not be used as the sole basis for treatment or other patient management decisions.  A negative result may occur with improper specimen collection / handling, submission  of specimen other than nasopharyngeal swab, presence of viral mutation(s) within the areas targeted by this assay, and inadequate number of viral copies (<250 copies / mL). A negative result must be combined with clinical observations, patient history, and epidemiological information.  Fact Sheet for Patients:   StrictlyIdeas.no  Fact Sheet for Healthcare Providers: BankingDealers.co.za  This test is not yet approved or  cleared by the [place] FDA and has been authorized for detection and/or diagnosis of SARS-CoV-2 by FDA under an Emergency Use Authorization (EUA).  This EUA will remain in effect (meaning this test can be used) for the duration of the COVID-19 declaration under Section 564(b)(1) of the Act, 21 U.S.C. section 360bbb-3(b)(1), unless the authorization is terminated or revoked sooner.  Performed at [facility], [place] [address] [place],  [address]   Urine culture     Status:  Abnormal   Collection Time: 09/13/19  8:06 PM   Specimen: In/Out Cath Urine  Result Value Ref Range Status   Specimen Description IN/OUT CATH URINE  Final   Special Requests   Final    NONE Performed at [facility], [address], [place], [place] [address]    Culture MULTIPLE SPECIES PRESENT, SUGGEST RECOLLECTION (A)  Final   Report Status 09/14/2019 FINAL  Final  MRSA PCR Screening     Status: None   Collection Time: 09/18/19  4:55 AM   Specimen: Nasal Mucosa; Nasopharyngeal  Result Value Ref Range Status   MRSA by PCR NEGATIVE NEGATIVE Final    Comment:        The GeneXpert MRSA Assay (FDA approved for NASAL specimens only), is one component of a comprehensive MRSA colonization surveillance program. It is not intended to diagnose MRSA infection nor to guide or monitor treatment for MRSA infections. Performed at [facility], [place] [address], [place], [place] [address]       Radiology Studies: US  RENAL  Result Date: Sep 18, 2019 CLINICAL DATA:  Acute kidney injury EXAM: RENAL / URINARY TRACT ULTRASOUND COMPLETE COMPARISON:  CT of November 24, 2018 FINDINGS: Right Kidney: Renal measurements: 12.2 x 5.4 x 7.0 cm = volume: 241 mL. No hydronephrosis. Decreased corticomedullary differentiation though assessment is limited by body habitus. Left Kidney: Renal measurements: 12.0 x 6.8 x 6.6 cm = volume: 279 mL. No hydronephrosis. Decreased corticomedullary differentiation suggested again body habitus limiting assessment. Bladder: Urinary bladder decompressed. The subtle findings of echogenicity in the area suggest correlation with Foley catheter which is in place. Other: Signs of disease with nodular hepatic contours and fissural widening. Marked gallbladder wall thickening, seen without signs of distension. No pericholecystic fluid. IMPRESSION: 1. Decreased corticomedullary differentiation, no hydronephrosis. 2. Signs of liver disease with nodular hepatic contour. Correlate with any clinical or laboratory evidence of liver disease. 3. Marked gallbladder wall thickening without distension, a finding that can be seen in the setting of hepatic or cardiac dysfunction. This finding can also be seen in acute hepatitis. Correlate with any RIGHT upper quadrant pain. HIDA scan could be helpful if there is clinical concern for acute gallbladder pathology. Electronically Signed   By: Zetta Bills M.D.   On: Sep 18, 2019 18:10   ECHOCARDIOGRAM COMPLETE  Result Date: 09/16/2019    ECHOCARDIOGRAM REPORT   Patient Name:   [name] Date of Exam: 09/16/2019 Medical Rec #:  [id number]        Height:       66.5 in Accession #:    [id number]       Weight:       311.7 lb Date of Birth:  September 14, 1980         BSA:          2.429 m Patient Age:    23 years         BP:           117/59 mmHg Patient Gender: F                HR:           71 bpm. Exam Location:  Inpatient Procedure: 2D Echo Indications:    acute renal failure   History:        Patient has prior history of Echocardiogram examinations, most                 recent 01/29/2018. Chronic kidney disease.  Sepsis.; Risk                 Factors:Diabetes and Hypertension.  Sonographer:    Johny Chess Referring Phys: [id number]  1. Left ventricular ejection fraction, by estimation, is 45 to 50%. The left ventricle has mildly decreased function. The left ventricle demonstrates global hypokinesis. The left ventricular internal cavity size was mildly dilated. Left ventricular diastolic parameters were normal.  2. Right ventricular systolic function is normal. The right ventricular size is normal.  3. Left atrial size was moderately dilated.  4. The mitral valve is normal in structure. Trivial mitral valve regurgitation. No evidence of mitral stenosis.  5. The aortic valve is normal in structure. Aortic valve regurgitation is not visualized. No aortic stenosis is present.  6. The inferior vena cava is dilated in size with >50% respiratory variability, suggesting right atrial pressure of 8 mmHg. FINDINGS  Left Ventricle: Left ventricular ejection fraction, by estimation, is 45 to 50%. The left ventricle has mildly decreased function. The left ventricle demonstrates global hypokinesis. The left ventricular internal cavity size was mildly dilated. There is  no left ventricular hypertrophy. Left ventricular diastolic parameters were normal. Right Ventricle: The right ventricular size is normal. No increase in right ventricular wall thickness. Right ventricular systolic function is normal. Left Atrium: Left atrial size was moderately dilated. Right Atrium: Right atrial size was normal in size. Pericardium: There is no evidence of pericardial effusion. Mitral Valve: The mitral valve is normal in structure. There is mild thickening of the mitral valve leaflet(s). Trivial mitral valve regurgitation. No evidence of mitral valve stenosis. Tricuspid Valve: The tricuspid  valve is normal in structure. Tricuspid valve regurgitation is trivial. No evidence of tricuspid stenosis. Aortic Valve: The aortic valve is normal in structure. Aortic valve regurgitation is not visualized. No aortic stenosis is present. Pulmonic Valve: The pulmonic valve was normal in structure. Pulmonic valve regurgitation is not visualized. No evidence of pulmonic stenosis. Aorta: The aortic root is normal in size and structure. Venous: The inferior vena cava is dilated in size with greater than 50% respiratory variability, suggesting right atrial pressure of 8 mmHg. IAS/Shunts: No atrial level shunt detected by color flow Doppler.  LEFT VENTRICLE PLAX 2D LVIDd:         6.00 cm      Diastology LVIDs:         4.60 cm      LV e' medial:    7.07 cm/s LV PW:         1.30 cm      LV E/e' medial:  12.8 LV IVS:        1.10 cm      LV e' lateral:   7.83 cm/s                             LV E/e' lateral: 11.5  LV Volumes (MOD) LV vol d, MOD A4C: 131.0 ml LV vol s, MOD A4C: 89.6 ml LV SV MOD A4C:     131.0 ml RIGHT VENTRICLE             IVC RV S prime:     15.10 cm/s  IVC diam: 2.70 cm TAPSE (M-mode): 2.3 cm LEFT ATRIUM             Index       RIGHT ATRIUM           Index LA diam:  4.40 cm 1.81 cm/m  RA Area:     10.90 cm LA Vol (A2C):   62.3 ml 25.65 ml/m RA Volume:   22.80 ml  9.39 ml/m LA Vol (A4C):   92.4 ml 38.05 ml/m LA Biplane Vol: 82.8 ml 34.10 ml/m  AORTIC VALVE LVOT Vmax:   102.00 cm/s LVOT Vmean:  67.200 cm/s LVOT VTI:    0.213 m  AORTA Ao Asc diam: 3.20 cm MITRAL VALVE MV Area (PHT): 3.72 cm    SHUNTS MV Decel Time: 204 msec    Systemic VTI: 0.21 m MV E velocity: 90.40 cm/s MV A velocity: 77.10 cm/s MV E/A ratio:  1.17 Jenkins Rouge MD Electronically signed by Jenkins Rouge MD Signature Date/Time: 09/16/2019/3:49:03 PM    Final       Scheduled Meds: . sodium chloride   Intravenous Once  . Chlorhexidine Gluconate Cloth  6 each Topical Daily  . feeding supplement  1 Container Oral TID BM  .  ferrous sulfate  325 mg Oral BID WC  . insulin aspart  0-15 Units Subcutaneous TID WC  . insulin aspart  0-5 Units Subcutaneous QHS  . insulin aspart  4 Units Subcutaneous TID WC  . insulin glargine  40 Units Subcutaneous QHS  . pregabalin  150 mg Oral QHS  . sodium zirconium cyclosilicate  10 g Oral Daily   Continuous Infusions: . furosemide 120 mg (09/17/19 0935)  . piperacillin-tazobactam (ZOSYN)  IV 3.375 g (09/17/19 0537)     LOS: 4 days      Time spent: 35 minutes   Dessa Phi, DO [facility] 09/17/2019, 10:05 AM   Available via Epic secure chat 7am-7pm After these hours, please refer to coverage provider listed on [email or web address]

## 2019-09-17 NOTE — Progress Notes (Signed)
[facility] Progress Note  Subjective:  per charting for OR today for amputation.  Her fluids were stopped and she received increasing doses of lasix.  300 mL UOP charted over 9/12.  States an MD told her she might need HD at some point  Review of systems:  She denies shortness of breath   Denies n/v Denies Cp  Has been NPO for surgery     Vitals:   09/16/19 2017 09/16/19 2200 09/17/19 0400 09/17/19 0500  BP:  137/76 116/64   Pulse:  87 75   Resp:      Temp: 98.7 F (37.1 C)  98.5 F (36.9 C)   TempSrc: Oral  Oral   SpO2:  94% 95%   Weight:    (!) 142.3 kg  Height:        Exam: Gen adult female in bed in NAD at rest  Sclera anicteric Clear anteriorly and unlabored  S1S2 no rub Abd soft ntnd obese habitus GU foley in place Ext 2+ edema,  L foot sig gangrenous changes Neuro is awake and oriented to person, location, time   Home meds:  - clonidine 0.2 bid/ crestor 20/ lasix 20 bid prn/ lisinopril-hctz 20-12.5 qd  - suboxone 8/2 two films qd/ zanaflex 4 tid/ lyrica 200 tid/ klonopin 0.5 bid prn  - insulin aspart 20u tid ac/ lantus 80u hs  - megace 40 tid prn  - prn's/ vitamins/ supplements    UA 9/9 - >50 wbc, 11-20 epi, 0-5 rbc, many bact, >300 prot, turbid   CXR 9/9 - IMPRESSION: Lungs clear. Heart borderline enlarged. No adenopathy.      Assessment/ Plan: 1. AoCKD 3b - b/l creat 1.7 from 10d ago, eGFR 35 ml/min.  AKI in setting of gangrenous foot vs possible infectious GN with chronic (> 37mos) and severe foot infection; also with volume overload.  Note had rec'd 3.5 gm IV vanc, unlikely to cause AKI. DC'd IV vanc.  +proteinuria > 300 1. UPC ratio with 3150 mg/g 2. Have discontinued lasix 120 mg IV BID given poor response to diuretic    3. May need RRT soon; will make NPO after midnight in the event access is needed on 9/14   2. Hyerkalemia - improved with medical measures.  On lokelma daily 3. Gangrene/ osteo L foot - IV abx and  amputation planned - note per charting is for 9/13 4. IDDM - per primary team.  With proteinuria.   5. Bipolar d/o - per primary team  6. HTN - home clonidine/ lisinopril/ hctz are on hold appropriately.  7. Metabolic acidosis - will need to start sodium bicarb when taking PO - she is NPO for surgery   8. Normocytic anemia - no acute indication for PRBC's 9. Obesity   Recent Labs  Lab 09/16/19 0700 09/16/19 0741 09/16/19 1441 09/17/19 0448  K 6.2*   < > 5.4* 5.2*  BUN 47*  --   --  51*  CREATININE 4.51*  --   --  5.33*  CALCIUM 8.5*  --   --  8.4*  HGB 8.2*  --   --  8.4*   < > = values in this interval not displayed.   Inpatient medications: . sodium chloride   Intravenous Once  . Chlorhexidine Gluconate Cloth  6 each Topical Daily  . feeding supplement  1 Container Oral TID BM  . ferrous sulfate  325 mg Oral BID WC  . insulin aspart  0-15 Units Subcutaneous TID WC  .  insulin aspart  0-5 Units Subcutaneous QHS  . insulin aspart  4 Units Subcutaneous TID WC  . insulin glargine  40 Units Subcutaneous QHS  . pregabalin  150 mg Oral QHS  . sodium zirconium cyclosilicate  10 g Oral Daily   . furosemide 120 mg (09/17/19 0935)  . piperacillin-tazobactam (ZOSYN)  IV 3.375 g (09/17/19 0537)   acetaminophen **OR** acetaminophen, albuterol, bismuth subsalicylate, clonazePAM, hydrALAZINE, hydrOXYzine, oxyCODONE-acetaminophen    Claudia Desanctis, MD 09/17/2019 11:50 AM

## 2019-09-17 NOTE — Progress Notes (Signed)
Pharmacy Antibiotic Note  Felicia Reynolds is a 39 y.o. female admitted on 09/13/2019 with diabetic foot infection.  Pharmacy has been consulted for Zosyn dosing - day #5. SCr trended up further to 5.33 today. May need RRT soon per Nephrology note.  Vanc 9/9>>9/11 (rising Scr) Zosyn 9/9>>  9/9 BCx - ngtd 9/9 UCx - mult species 9/11 MRSA PCR: neg  Plan: Reduce Zosyn to 3.375g IV q12h (extended infusion) for worsening renal function Monitor clinical progress, c/s, renal function F/u de-escalation plan/LOT post-op Likely amputation today   Height: 5' 6.5" (168.9 cm) Weight: (!) 142.3 kg (313 lb 11.4 oz) IBW/kg (Calculated) : 60.45  Temp (24hrs), Avg:98.4 F (36.9 C), Min:98.1 F (36.7 C), Max:98.7 F (37.1 C)  Recent Labs  Lab 09/13/19 1204 09/13/19 1217 09/13/19 1217 09/13/19 1228 09/14/19 0731 09/15/19 0332 09/15/19 1830 09/16/19 0700 09/17/19 0448  WBC  --  14.6*  --   --  8.7 6.4  --  7.6 8.5  CREATININE  --  2.59*   < > 2.70* 3.17* 3.68*  --  4.51* 5.33*  LATICACIDVEN 1.7  --   --   --   --   --   --   --   --   VANCORANDOM  --   --   --   --   --   --  24  --   --    < > = values in this interval not displayed.    Estimated Creatinine Clearance: 20.4 mL/min (A) (by C-G formula based on SCr of 5.33 mg/dL (H)).    Allergies  Allergen Reactions  . Hydrocodone Itching  . Latex Itching and Rash   Felicia Reynolds, PharmD, BCPS Clinical Staff Pharmacist [email or web address]  Felicia Reynolds 09/17/2019 12:03 PM

## 2019-09-17 NOTE — Progress Notes (Signed)
Dr. Smith Desiree Fleming notified of am potassium level. Per Dr. Smith Temitayo Covalt no need to repeat potassium.

## 2019-09-17 NOTE — Progress Notes (Signed)
Patient ID: Felicia Reynolds, female   DOB: 1980-08-02, 39 y.o.   MRN: [id number]   LOS: 4 days   Subjective: Tired but glad that surgery will go ahead and be done.   Objective: Vital signs in last 24 hours: Temp:  [98.1 F (36.7 C)-98.7 F (37.1 C)] 98.5 F (36.9 C) (09/13 0400) Pulse Rate:  [66-89] 75 (09/13 0400) Resp:  [20] 20 (09/12 1220) BP: (116-145)/(59-88) 116/64 (09/13 0400) SpO2:  [94 %-97 %] 95 % (09/13 0400) Weight:  [142.3 kg] 142.3 kg (09/13 0500) Last BM Date: 09/12/19   Laboratory  CBC Recent Labs    09/16/19 0700 09/17/19 0448  WBC 7.6 8.5  HGB 8.2* 8.4*  HCT 26.2* 26.5*  PLT 156 186   BMET Recent Labs    09/16/19 0700 09/16/19 0741 09/16/19 1441 09/17/19 0448  NA 134*  --   --  133*  K 6.2*   < > 5.4* 5.2*  CL 105  --   --  102  CO2 18*  --   --  17*  GLUCOSE 76  --   --  110*  BUN 47*  --   --  51*  CREATININE 4.51*  --   --  5.33*  CALCIUM 8.5*  --   --  8.4*   < > = values in this interval not displayed.     Physical Exam General appearance: alert and no distress  Left foot: Hallux with continued edema, foul odor   Assessment/Plan: Left great toe osteo -- Plan amputation possibly to include 1st ray by Dr. Erlinda Hong this afternoon. Please keep NPO.    Lisette Abu, PA-C Orthopedic Surgery [phone] 09/17/2019

## 2019-09-18 ENCOUNTER — Other Ambulatory Visit: Payer: Self-pay | Admitting: Physician Assistant

## 2019-09-18 ENCOUNTER — Encounter (HOSPITAL_COMMUNITY): Payer: Self-pay | Admitting: Orthopaedic Surgery

## 2019-09-18 LAB — CULTURE, BLOOD (ROUTINE X 2)
Culture: NO GROWTH
Culture: NO GROWTH
Special Requests: ADEQUATE

## 2019-09-18 LAB — COMPREHENSIVE METABOLIC PANEL
ALT: 9 U/L (ref 0–44)
AST: 16 U/L (ref 15–41)
Albumin: 1.6 g/dL — ABNORMAL LOW (ref 3.5–5.0)
Alkaline Phosphatase: 599 U/L — ABNORMAL HIGH (ref 38–126)
Anion gap: 16 — ABNORMAL HIGH (ref 5–15)
BUN: 63 mg/dL — ABNORMAL HIGH (ref 6–20)
CO2: 16 mmol/L — ABNORMAL LOW (ref 22–32)
Calcium: 8.4 mg/dL — ABNORMAL LOW (ref 8.9–10.3)
Chloride: 101 mmol/L (ref 98–111)
Creatinine, Ser: 5.95 mg/dL — ABNORMAL HIGH (ref 0.44–1.00)
GFR calc Af Amer: 9 mL/min — ABNORMAL LOW (ref >60)
GFR calc non Af Amer: 8 mL/min — ABNORMAL LOW (ref >60)
Glucose, Bld: 148 mg/dL — ABNORMAL HIGH (ref 70–99)
Potassium: 5.7 mmol/L — ABNORMAL HIGH (ref 3.5–5.1)
Sodium: 133 mmol/L — ABNORMAL LOW (ref 135–145)
Total Bilirubin: 2 mg/dL — ABNORMAL HIGH (ref 0.3–1.2)
Total Protein: 5.9 g/dL — ABNORMAL LOW (ref 6.5–8.1)

## 2019-09-18 LAB — CBC
HCT: 30.7 % — ABNORMAL LOW (ref 36.0–46.0)
Hemoglobin: 9.6 g/dL — ABNORMAL LOW (ref 12.0–15.0)
MCH: 25.5 pg — ABNORMAL LOW (ref 26.0–34.0)
MCHC: 31.3 g/dL (ref 30.0–36.0)
MCV: 81.6 fL (ref 80.0–100.0)
Platelets: 268 10*3/uL (ref 150–400)
RBC: 3.76 MIL/uL — ABNORMAL LOW (ref 3.87–5.11)
RDW: 19.5 % — ABNORMAL HIGH (ref 11.5–15.5)
WBC: 16.4 10*3/uL — ABNORMAL HIGH (ref 4.0–10.5)
nRBC: 0 % (ref 0.0–0.2)

## 2019-09-18 LAB — GLUCOSE, CAPILLARY
Glucose-Capillary: 158 mg/dL — ABNORMAL HIGH (ref 70–99)
Glucose-Capillary: 164 mg/dL — ABNORMAL HIGH (ref 70–99)
Glucose-Capillary: 185 mg/dL — ABNORMAL HIGH (ref 70–99)
Glucose-Capillary: 236 mg/dL — ABNORMAL HIGH (ref 70–99)

## 2019-09-18 LAB — POTASSIUM: Potassium: 5.7 mmol/L — ABNORMAL HIGH (ref 3.5–5.1)

## 2019-09-18 LAB — SURGICAL PCR SCREEN
MRSA, PCR: NEGATIVE
Staphylococcus aureus: NEGATIVE

## 2019-09-18 LAB — HCG, QUANTITATIVE, PREGNANCY: hCG, Beta Chain, Quant, S: 2 m[IU]/mL (ref <5)

## 2019-09-18 MED ORDER — SODIUM BICARBONATE 650 MG PO TABS
650.0000 mg | ORAL_TABLET | Freq: Three times a day (TID) | ORAL | Status: DC
  Administered 2019-09-18 – 2019-09-19 (×5): 650 mg via ORAL
  Filled 2019-09-18 (×6): qty 1

## 2019-09-18 MED ORDER — FUROSEMIDE 10 MG/ML IJ SOLN
80.0000 mg | Freq: Once | INTRAMUSCULAR | Status: AC
  Administered 2019-09-18: 80 mg via INTRAVENOUS
  Filled 2019-09-18: qty 8

## 2019-09-18 MED ORDER — SODIUM POLYSTYRENE SULFONATE 15 GM/60ML PO SUSP
30.0000 g | Freq: Once | ORAL | Status: AC
  Administered 2019-09-18: 30 g via ORAL
  Filled 2019-09-18: qty 120

## 2019-09-18 MED ORDER — SODIUM BICARBONATE 8.4 % IV SOLN: 50.0000 meq | Freq: Once | INTRAVENOUS | Status: DC

## 2019-09-18 MED ORDER — PANTOPRAZOLE SODIUM 20 MG PO TBEC
20.0000 mg | DELAYED_RELEASE_TABLET | Freq: Two times a day (BID) | ORAL | Status: DC | PRN
  Filled 2019-09-18: qty 1

## 2019-09-18 MED ORDER — FUROSEMIDE 10 MG/ML IJ SOLN: 80.0000 mg | Freq: Once | INTRAMUSCULAR | Status: DC

## 2019-09-18 NOTE — H&P (View-Only) (Signed)
ORTHOPAEDIC CONSULTATION  REQUESTING PHYSICIAN: Dessa Phi, DO  Chief Complaint: Osteomyelitis ulceration abscess and cellulitis left foot.  HPI: Felicia Reynolds is a 39 y.o. female who presents with diabetic insensate neuropathy with uncontrolled type 2 diabetes with cellulitis ulceration osteomyelitis with abscess first ray left foot.  Patient is status post initial ray resection debridement and application of a wound VAC.  Patient is seen today in consultation for Dr. Erlinda Hong.  Past Medical History:  Diagnosis Date  . Anxiety   . Arthritis   . Bipolar disorder ([place])   . Diabetes ([place]) 06/05/2015  . Diabetes mellitus   . Fibromyalgia   . History of abnormal cervical Pap smear 05/29/2015  . History of diabetes mellitus, type II 05/29/2015  . Hypertension   . Neuropathy   . Obesity 05/29/2015  . Osteoporosis   . Ovarian cyst 02/20/2016  . Ovarian cyst 02/20/2016   Simple cyst rt ovary  . Panic attacks   . Pneumonia   . Stroke ([place])   . Superficial fungus infection of skin 05/29/2015  . Vitamin D deficiency 06/05/2015   Past Surgical History:  Procedure Laterality Date  . AMPUTATION Left 09/17/2019   Procedure: AMPUTATION LEFT GREAT TOE/ RAY WITH WOUND VAC APPLICATION;  Surgeon: Leandrew Koyanagi, MD;  Location: [facility];  Service: Orthopedics;  Laterality: Left;  . CESAREAN SECTION    . HERNIA REPAIR    . THROAT SURGERY     Social History   Socioeconomic History  . Marital status: Married    Spouse name: Not on file  . Number of children: Not on file  . Years of education: Not on file  . Highest education level: Not on file  Occupational History  . Not on file  Tobacco Use  . Smoking status: Current Some Day Smoker    Packs/day: 0.50    Years: 13.00    Pack years: 6.50    Types: Cigarettes  . Smokeless tobacco: Never Used  Vaping Use  . Vaping Use: Never used  Substance and Sexual Activity  . Alcohol use: No    Alcohol/week: 0.0 standard drinks  . Drug use: No  .  Sexual activity: Yes    Birth control/protection: None  Other Topics Concern  . Not on file  Social History Narrative   Has been in contact with social services for assistance with housing and medications,    Social Determinants of Health   Financial Resource Strain: Medium Risk  . Difficulty of Paying Living Expenses: Somewhat hard  Food Insecurity: Food Insecurity Present  . Worried About Charity fundraiser in the Last Year: Sometimes true  . Ran Out of Food in the Last Year: Sometimes true  Transportation Needs: Unmet Transportation Needs  . Lack of Transportation (Medical): Yes  . Lack of Transportation (Non-Medical): Yes  Physical Activity: Inactive  . Days of Exercise per Week: 0 days  . Minutes of Exercise per Session: 0 min  Stress: Stress Concern Present  . Feeling of Stress : Rather much  Social Connections: Moderately Isolated  . Frequency of Communication with Friends and Family: More than three times a week  . Frequency of Social Gatherings with Friends and Family: More than three times a week  . Attends Religious Services: Never  . Active Member of Clubs or Organizations: No  . Attends Archivist Meetings: Never  . Marital Status: Married   Family History  Problem Relation Age of Onset  . Cancer Mother 14  breast  . Breast cancer Mother   . Breast cancer Maternal Aunt   . Breast cancer Maternal Grandmother   . Cancer Maternal Aunt    - negative except otherwise stated in the family history section Allergies  Allergen Reactions  . Hydrocodone Itching  . Latex Itching and Rash   Prior to Admission medications   Medication Sig Start Date End Date Taking? Authorizing Provider  bismuth subsalicylate (PEPTO BISMOL) 262 MG/15ML suspension Take 30 mLs by mouth daily as needed for indigestion.   Yes [provider]  clonazePAM (KLONOPIN) 0.5 MG tablet Take 0.5 mg by mouth 2 (two) times daily as needed for anxiety. 08/13/19  Yes [provider]  cloNIDine (CATAPRES) 0.2 MG tablet Take 0.2 mg by mouth 2 (two) times daily.   Yes [provider]  diclofenac sodium (VOLTAREN) 1 % GEL Apply 2 g topically 4 (four) times daily as needed (pain).    Yes [provider]  furosemide (LASIX) 20 MG tablet Take 20 mg by mouth 2 (two) times daily as needed for edema. 04/21/19  Yes [provider]  hydrOXYzine (ATARAX/VISTARIL) 50 MG tablet Take 50 mg by mouth 2 (two) times daily as needed for anxiety. 06/09/19  Yes [provider]  insulin aspart (NOVOLOG) 100 UNIT/ML injection Inject 30 Units into the skin 3 (three) times daily with meals. Patient taking differently: Inject 2-30 Units into the skin 3 (three) times daily with meals. Per sliding scale: not provided 09/15/18  Yes Eure, Mertie Clause, MD  Insulin Glargine (LANTUS SOLOSTAR) 100 UNIT/ML Solostar Pen Inject 80 Units into the skin at bedtime. Patient taking differently: Inject 80 Units into the skin 2 (two) times daily. Inject -80 units under the skin twice daily. 09/15/18  Yes Florian Buff, MD  lisinopril-hydrochlorothiazide (ZESTORETIC) 20-12.5 MG tablet Take 1 tablet by mouth daily. 04/21/19  Yes [provider]  pregabalin (LYRICA) 200 MG capsule Take 200 mg by mouth 3 (three) times daily.    Yes [provider]  PROAIR HFA 108 (90 Base) MCG/ACT inhaler Inhale 1 puff into the lungs 4 (four) times daily as needed for wheezing or shortness of breath. 08/15/19  Yes [provider]  silver sulfADIAZINE (SILVADENE) 1 % cream APPLY TO AFFECTED AREA 2 OR 3 TIMES DAILY Patient taking differently: Apply 1 application topically 3 (three) times daily. APPLY TO AFFECTED AREA 2 OR 3 TIMES DAILY 03/30/19  Yes Florian Buff, MD  azithromycin (ZITHROMAX) 500 MG tablet Take 1 tablet (500 mg total) by mouth daily. Patient not taking: Reported on 06/11/2019 11/25/18   Orson Eva, MD  cefdinir (OMNICEF) 300 MG capsule Take 1 capsule (300 mg  total) by mouth 2 (two) times daily. Patient not taking: Reported on 09/13/2019 11/25/18   Orson Eva, MD  megestrol (MEGACE) 40 MG tablet Take 1 tablet (40 mg total) by mouth 3 (three) times daily as needed (regulate period). Patient not taking: Reported on 09/13/2019 12/05/18   Florian Buff, MD  NOVOLOG FLEXPEN 100 UNIT/ML FlexPen INJECT 30 UNITS SUBCUTANEOUSLY 3 TIMES A DAY WITH MEALS Patient not taking: Reported on 09/13/2019 09/07/19   Florian Buff, MD  rosuvastatin (CRESTOR) 20 MG tablet Take 20 mg by mouth at bedtime. 06/21/19   [provider]  SUBOXONE 8-2 MG FILM Place 2 Film under the tongue daily.  Patient not taking: Reported on 09/13/2019 11/17/18   [provider]  tiZANidine (ZANAFLEX) 4 MG capsule Take 4 mg by mouth 3 (  three) times daily. Patient not taking: Reported on 09/13/2019    [provider]   ECHOCARDIOGRAM COMPLETE  Result Date: 09/16/2019    ECHOCARDIOGRAM REPORT   Patient Name:   [name] Date of Exam: 09/16/2019 Medical Rec #:  [id number]        Height:       66.5 in Accession #:    [id number]       Weight:       311.7 lb Date of Birth:  [birth year]         BSA:          2.429 m Patient Age:    47 years         BP:           117/59 mmHg Patient Gender: F                HR:           71 bpm. Exam Location:  Inpatient Procedure: 2D Echo Indications:    acute renal failure  History:        Patient has prior history of Echocardiogram examinations, most                 recent 01/29/2018. Chronic kidney disease. Sepsis.; Risk                 Factors:Diabetes and Hypertension.  Sonographer:    Johny Chess Referring Phys: [id number] [place]  1. Left ventricular ejection fraction, by estimation, is 45 to 50%. The left ventricle has mildly decreased function. The left ventricle demonstrates global hypokinesis. The left ventricular internal cavity size was mildly dilated. Left ventricular diastolic parameters were normal.  2. Right ventricular  systolic function is normal. The right ventricular size is normal.  3. Left atrial size was moderately dilated.  4. The mitral valve is normal in structure. Trivial mitral valve regurgitation. No evidence of mitral stenosis.  5. The aortic valve is normal in structure. Aortic valve regurgitation is not visualized. No aortic stenosis is present.  6. The inferior vena cava is dilated in size with >50% respiratory variability, suggesting right atrial pressure of 8 mmHg. FINDINGS  Left Ventricle: Left ventricular ejection fraction, by estimation, is 45 to 50%. The left ventricle has mildly decreased function. The left ventricle demonstrates global hypokinesis. The left ventricular internal cavity size was mildly dilated. There is  no left ventricular hypertrophy. Left ventricular diastolic parameters were normal. Right Ventricle: The right ventricular size is normal. No increase in right ventricular wall thickness. Right ventricular systolic function is normal. Left Atrium: Left atrial size was moderately dilated. Right Atrium: Right atrial size was normal in size. Pericardium: There is no evidence of pericardial effusion. Mitral Valve: The mitral valve is normal in structure. There is mild thickening of the mitral valve leaflet(s). Trivial mitral valve regurgitation. No evidence of mitral valve stenosis. Tricuspid Valve: The tricuspid valve is normal in structure. Tricuspid valve regurgitation is trivial. No evidence of tricuspid stenosis. Aortic Valve: The aortic valve is normal in structure. Aortic valve regurgitation is not visualized. No aortic stenosis is present. Pulmonic Valve: The pulmonic valve was normal in structure. Pulmonic valve regurgitation is not visualized. No evidence of pulmonic stenosis. Aorta: The aortic root is normal in size and structure. Venous: The inferior vena cava is dilated in size with greater than 50% respiratory variability, suggesting right atrial pressure of 8 mmHg. IAS/Shunts: No  atrial level shunt detected by color flow Doppler.  LEFT VENTRICLE PLAX 2D LVIDd:         6.00 cm      Diastology LVIDs:         4.60 cm      LV e' medial:    7.07 cm/s LV PW:         1.30 cm      LV E/e' medial:  12.8 LV IVS:        1.10 cm      LV e' lateral:   7.83 cm/s                             LV E/e' lateral: 11.5  LV Volumes (MOD) LV vol d, MOD A4C: 131.0 ml LV vol s, MOD A4C: 89.6 ml LV SV MOD A4C:     131.0 ml RIGHT VENTRICLE             IVC RV S prime:     15.10 cm/s  IVC diam: 2.70 cm TAPSE (M-mode): 2.3 cm LEFT ATRIUM             Index       RIGHT ATRIUM           Index LA diam:        4.40 cm 1.81 cm/m  RA Area:     10.90 cm LA Vol (A2C):   62.3 ml 25.65 ml/m RA Volume:   22.80 ml  9.39 ml/m LA Vol (A4C):   92.4 ml 38.05 ml/m LA Biplane Vol: 82.8 ml 34.10 ml/m  AORTIC VALVE LVOT Vmax:   102.00 cm/s LVOT Vmean:  67.200 cm/s LVOT VTI:    0.213 m  AORTA Ao Asc diam: 3.20 cm MITRAL VALVE MV Area (PHT): 3.72 cm    SHUNTS MV Decel Time: 204 msec    Systemic VTI: 0.21 m MV E velocity: 90.40 cm/s MV A velocity: 77.10 cm/s MV E/A ratio:  1.17 Jenkins Rouge MD Electronically signed by Jenkins Rouge MD Signature Date/Time: 09/16/2019/3:49:03 PM    Final    - pertinent xrays, CT, MRI studies were reviewed and independently interpreted  Positive ROS: All other systems have been reviewed and were otherwise negative with the exception of those mentioned in the HPI and as above.  Physical Exam: General: Alert, no acute distress Psychiatric: Patient is competent for consent with normal mood and affect Lymphatic: No axillary or cervical lymphadenopathy Cardiovascular: No pedal edema Respiratory: No cyanosis, no use of accessory musculature GI: No organomegaly, abdomen is soft and non-tender    Images:  @ENCIMAGES @  Labs:  Lab Results  Component Value Date   HGBA1C 9.0 (H) 09/13/2019   HGBA1C 8.3 (H) 11/25/2018   HGBA1C 8.4 (H) 11/24/2018   REPTSTATUS PENDING 09/17/2019   GRAMSTAIN   09/17/2019    MODERATE WBC PRESENT,BOTH PMN AND MONONUCLEAR FEW GRAM POSITIVE COCCI    CULT  09/17/2019    CULTURE REINCUBATED FOR BETTER GROWTH Performed at [facility], [address], [place], [place] [address]    LABORGA ESCHERICHIA COLI 11/28/2011    Lab Results  Component Value Date   ALBUMIN 1.6 (L) 09/18/2019   ALBUMIN 1.9 (L) 09/17/2019   ALBUMIN 2.2 (L) 09/16/2019    Neurologic: Patient does not have protective sensation bilateral lower extremities.   MUSCULOSKELETAL:   Skin: Examination there is cellulitis around the wound the wound is packed open with a wound VAC black sponge.  There is cellulitis around  the skin extending into the hindfoot.  There is minimal serosanguineous drainage in the wound VAC canister.  Patient has a strong dorsalis pedis pulse no arterial insufficiency  BMI approximately 50 with an albumin of 1.6 and a hemoglobin A1c of 9.0.  Assessment: Assessment: Uncontrolled type 2 diabetes with severe protein caloric malnutrition with abscess ulceration and osteomyelitis left foot.  Plan: Plan: Recommended proceeding with foot salvage intervention we will try further debridement and potential wound closure at this time patient may require repeat surgery on Friday.  Discussed that she is at risk for a below the knee amputation but will aggressively proceed with foot salvage intervention.  Plan for left foot revision first ray amputation and possible wound closure tomorrow Wednesday.  Thank you for the consult and the opportunity to see Ms. Clearence Ped, MD [place] [phone] 1:31 PM

## 2019-09-18 NOTE — Progress Notes (Addendum)
[facility] Progress Note  Subjective:  Her morning labs appear ordered but not drawn.  Spoke with nursing.  She had 1.7 liters UOP over 9/13.  Marked improvement from day prior.  She states that today she feels a lot better. Had amputation of left great toe yesterday.  She would want HD if needed - she is conversant today and we discussed dialysis.    Review of systems:   She denies shortness of breath   Denies n/v Denies Cp  Has been NPO for surgery     Vitals:   09/18/19 0000 09/18/19 0347 09/18/19 0600 09/18/19 0830  BP: (!) 147/83 139/70 130/68 (!) 147/80  Pulse: 83 78 73 84  Resp: 15 14 13 16   Temp:  98 F (36.7 C)  99 F (37.2 C)  TempSrc:  Oral    SpO2: 91% 91% 93%   Weight:      Height:        Exam: Gen adult female in bed in NAD at rest  Sclera anicteric Clear anteriorly and unlabored  S1S2 no rub Abd soft ntnd obese habitus GU foley in place Ext trace edema,  L foot sig gangrenous changes Neuro is awake and conversant, follows commands   Home meds:  - clonidine 0.2 bid/ crestor 20/ lasix 20 bid prn/ lisinopril-hctz 20-12.5 qd  - suboxone 8/2 two films qd/ zanaflex 4 tid/ lyrica 200 tid/ klonopin 0.5 bid prn  - insulin aspart 20u tid ac/ lantus 80u hs  - megace 40 tid prn  - prn's/ vitamins/ supplements    UA 9/9 - >50 wbc, 11-20 epi, 0-5 rbc, many bact, >300 prot, turbid   CXR 9/9 - IMPRESSION: Lungs clear. Heart borderline enlarged. No adenopathy.      Assessment/ Plan:  1. AoCKD 3b - b/l creat 1.7 from 10d ago, eGFR 35 ml/min.  AKI in setting of gangrenous foot vs possible infectious GN with chronic (> 85mo) and severe foot infection; also with volume overload.  Note had rec'd 3.5 gm IV vanc, unlikely to cause AKI. DC'd IV vanc.  +proteinuria > 300.  UPC ratio with 3150 mg/g - may be secondary to DM as well  1. Defer scheduled diuretic 2. Renal panel is ordered stat   3. May need RRT soon; we had made NPO after midnight in the  event access was needed on 9/14 - as labs ordered but not drawn no info to guide yet.  Hope to be able to defer HD.  She is clinically improved today and urine output much better 4. Continue foley   2. Hyerkalemia - improved with medical measures.  On lokelma daily. Await renal panel  3. Gangrene/ osteo L foot - IV abx and s/p amputation  4. IDDM - per primary team.  With proteinuria.   5. Bipolar d/o - per primary team  6. HTN - home clonidine/ lisinopril/ hctz are on hold appropriately.  7. Metabolic acidosis - will start sodium bicarb  8. Normocytic anemia - no acute indication for PRBC's.  Today's labs not yet drawn 9. Obesity   Recent Labs  Lab 09/16/19 0700 09/16/19 0741 09/16/19 1441 09/17/19 0448  K 6.2*   < > 5.4* 5.2*  BUN 47*  --   --  51*  CREATININE 4.51*  --   --  5.33*  CALCIUM 8.5*  --   --  8.4*  HGB 8.2*  --   --  8.4*   < > = values in this interval  not displayed.   Inpatient medications: . sodium chloride   Intravenous Once  . Chlorhexidine Gluconate Cloth  6 each Topical Daily  . feeding supplement  1 Container Oral TID BM  . ferrous sulfate  325 mg Oral BID WC  . insulin aspart  0-15 Units Subcutaneous TID WC  . insulin aspart  0-5 Units Subcutaneous QHS  . insulin aspart  4 Units Subcutaneous TID WC  . insulin glargine  40 Units Subcutaneous QHS  . mupirocin ointment  1 application Nasal BID  . pregabalin  150 mg Oral QHS  . sodium zirconium cyclosilicate  10 g Oral Daily   . sodium chloride 500 mL/hr at 09/17/19 1605  . piperacillin-tazobactam (ZOSYN)  IV Stopped (09/17/19 2236)   acetaminophen **OR** acetaminophen, albuterol, bismuth subsalicylate, clonazePAM, hydrALAZINE, hydrOXYzine, oxyCODONE-acetaminophen, pantoprazole    Claudia Desanctis, MD 09/18/2019 9:23 AM  Reviewed labs.  Kayexalate ordered.  1 amp bicarb.  uop has improved and hopeful for Cr plateau.  She was ordered a diet - I have modified to renal consistent carb.  No dialysis today  - watching closely  Claudia Desanctis 09/18/2019 11:13 AM

## 2019-09-18 NOTE — Progress Notes (Signed)
Subjective: 1 Day Post-Op Procedure(s) (LRB): AMPUTATION LEFT GREAT TOE/ RAY WITH WOUND VAC APPLICATION (Left) Patient reports pain as mild.    Objective: Vital signs in last 24 hours: Temp:  [98 F (36.7 C)-98.6 F (37 C)] 98 F (36.7 C) (09/14 0347) Pulse Rate:  [71-86] 73 (09/14 0600) Resp:  [11-16] 13 (09/14 0600) BP: (130-152)/(61-83) 130/68 (09/14 0600) SpO2:  [87 %-100 %] 93 % (09/14 0600)  Intake/Output from previous day: 09/13 0701 - 09/14 0700 In: 170 [P.O.:120; IV Piggyback:50] Out: 1710 [LHTDS:2876; Drains:50; Blood:10] Intake/Output this shift: No intake/output data recorded.  Recent Labs    09/15/19 1830 09/16/19 0700 09/17/19 0448  HGB 8.7* 8.2* 8.4*   Recent Labs    09/16/19 0700 09/17/19 0448  WBC 7.6 8.5  RBC 3.21* 3.22*  HCT 26.2* 26.5*  PLT 156 186   Recent Labs    09/16/19 0700 09/16/19 0741 09/16/19 1441 09/17/19 0448  NA 134*  --   --  133*  K 6.2*   < > 5.4* 5.2*  CL 105  --   --  102  CO2 18*  --   --  17*  BUN 47*  --   --  51*  CREATININE 4.51*  --   --  5.33*  GLUCOSE 76  --   --  110*  CALCIUM 8.5*  --   --  8.4*   < > = values in this interval not displayed.   Recent Labs    09/15/19 1830  INR 1.3*    Intact pulses distally Compartment soft  Approximately 75cc blood in wound vac canister   Assessment/Plan: 1 Day Post-Op Procedure(s) (LRB): AMPUTATION LEFT GREAT TOE/ RAY WITH WOUND VAC APPLICATION (Left) Up with therapy  Non-weight bearing LLE Intra-op cx growing gram positive cocci Continue wound vac Continue zosyn Dr. Sharol Given to assume care today for further washout left foot      Aundra Dubin 09/18/2019, 8:24 AM

## 2019-09-18 NOTE — Progress Notes (Addendum)
PROGRESS NOTE    Felicia Reynolds  QAS:[id number] DOB: 12/09/1980 DOA: 09/13/2019 PCP: [facility]     Brief Narrative:  Felicia Reynolds is a 39 y.o. female with medical history significant of IDDM with insulin resistance, diabetic neuropathy, hypertension, CKD stage III, chronic iron deficiency anemia secondary to menorrhagia, Suboxone therapy, presented with worsening of left toe/foot infection.  10 days ago, patient stepped on piece of mirror which cut open her left toe and distal left foot. She did not notice until few hours later because of the diabetic neuropathy.  2 days later she started to feel pain which gradually getting worse, she went to her PCP and received a few days of p.o. Augmentin.  For the last week, she developed worsening of pain, today pain is 10/10, constant, and swelling and purulent discharge with foul smell.  Also had subjective fever and chills at home.  Patient also reported she has separated with her spouse and has been living in the garage since last week.  Meanwhile she ran out of her insulin and Suboxone for about 7 days.  She has not been able to stand up and put weight on the left foot because of the severe pain, and she reported hard time to transfer herself out of bed. In the ED, patient was found to be tachycardia, blood pressure borderline low, fever of 103.  WBC 14.6, worsening of creatinine level from baseline 1.6 to now 2.59.  Left foot x-ray showed soft tissue air tracking along the fourth digit extending from first distal phalanx to proximal fourth metatarsal consistent with soft tissue infection concern for bony destruction of the first proximal phalanx and fracture of the first distal phalanx.  Orthopedic surgery was consulted.  Patient was started on IV Zosyn and vancomycin.  Due to continued worsening of her kidney function, nephrology was consulted.  Vancomycin was stopped.    New events last 24 hours / Subjective: Patient underwent excisional  debridement, left foot first ray amputation by Dr. Erlinda Hong 9/13.  She complains of being thirsty and hungry today.  She remains n.p.o. for possible hemodialysis access placement  Assessment & Plan:   Active Problems:   Osteomyelitis (HCC)   Sepsis ([place])   Osteomyelitis of toe of left foot (HCC)   Skin ulcer of toe of left foot with fat layer exposed ([place])   Severe sepsis secondary to osteomyelitis of the left great toe and UTI, present on admission -Presented with fever 103.2, tachycardia, leukocytosis, endorgan damage of AKI -Foot x-ray revealed soft tissue air tracking along the first digit extending from the first distal phalanx to the proximal first metatarsal consistent with soft tissue infection. Concern for bony destruction along the distal aspect of the first proximal phalanx. Fracture along the lateral aspect of the proximal portion of the first distal phalanx. -ABI 0.94 on the left -Blood cultures negative to date -Urine culture with multiple species -Continue Zosyn -Status post excisional debridement, left foot first ray amputation by Dr. Erlinda Hong 9/13.  She will need additional washout in a couple of days.  Wound VAC in place currently  AKI on CKD stage IIIb -Baseline creatinine 1.7 -Avoid nephrotoxins -Renal ultrasound showed decreased corticomedullary differentiation, no hydronephrosis -Nephrology following-she may need RRT, possibly HD  Acute on chronic iron deficiency anemia, chronic blood loss from menorrhagia -Transfused total 3 unit packed red blood cell  -Ferrous sulfate. Avoid IV iron in acute infectious process -Patient admits to menorrhagia, no blood from stool reported. FOBT pending -Hemolysis  labs negative -Hemoglobin remains stable  Uncontrolled diabetes with hyperglycemia and neuropathy -Hemoglobin A1c 9.0 -Continue Lantus, mealtime NovoLog, sliding scale insulin  Abnormal liver on ultrasound -LFTs normal, patient denies any history of alcohol abuse.  She denies  any right upper quadrant abdominal pain -Alk phosphatase is elevated, GGT elevated  -Hepatitis panel negative -Will need further outpatient work up once acute illness resolves   Morbid obesity Estimated body mass index is 49.88 kg/m as calculated from the following:   Height as of this encounter: 5' 6.5" (1.689 m).   Weight as of this encounter: 142.3 kg.      DVT prophylaxis:  Place and maintain sequential compression device Start: 09/15/19 0718  Code Status: Full code Family Communication: No family at bedside Disposition Plan:   Status is: Inpatient  Remains inpatient appropriate because:Ongoing diagnostic testing needed not appropriate for outpatient work up, IV treatments appropriate due to intensity of illness or inability to take PO and Inpatient level of care appropriate due to severity of illness   Dispo: The patient is from: Home              Anticipated d/c is to: Home              Anticipated d/c date is: > 3 days              Patient currently is not medically stable to d/c.  Remains on IV antibiotics. Remains on IV fluid.  She will need additional washout in a couple of days per orthopedic surgery. Kidney function continues to worsen   Consultants:   Orthopedic surgery  Nephrology  Procedures:  9/13:  PROCEDURE:  1.  Excisional debridement of left foot skin, subcutaneous tissue, muscle, tendon, bone 85 cm 2.  Left foot first ray amputation 3.  Application of wound VAC greater than 50 cm  Antimicrobials:  Anti-infectives (From admission, onward)   Start     Dose/Rate Route Frequency Ordered Stop   09/18/19 0600  ceFAZolin (ANCEF) 3 g in dextrose 5 % 50 mL IVPB        3 g 100 mL/hr over 30 Minutes Intravenous On call to O.R. 09/17/19 1421 09/17/19 1528   09/17/19 1800  piperacillin-tazobactam (ZOSYN) IVPB 3.375 g        3.375 g 12.5 mL/hr over 240 Minutes Intravenous Every 12 hours 09/17/19 1206     09/14/19 1300  vancomycin (VANCOREADY) IVPB 1250  mg/250 mL  Status:  Discontinued        1,250 mg 166.7 mL/hr over 90 Minutes Intravenous Every 24 hours 09/13/19 1215 09/15/19 1130   09/13/19 2200  piperacillin-tazobactam (ZOSYN) IVPB 3.375 g  Status:  Discontinued        3.375 g 12.5 mL/hr over 240 Minutes Intravenous Every 8 hours 09/13/19 1215 09/13/19 1519   09/13/19 1730  piperacillin-tazobactam (ZOSYN) IVPB 3.375 g  Status:  Discontinued        3.375 g 12.5 mL/hr over 240 Minutes Intravenous Every 8 hours 09/13/19 1519 09/17/19 1206   09/13/19 1230  vancomycin (VANCOREADY) IVPB 2000 mg/400 mL        2,000 mg 200 mL/hr over 120 Minutes Intravenous  Once 09/13/19 1215 09/13/19 1635   09/13/19 1230  piperacillin-tazobactam (ZOSYN) IVPB 3.375 g        3.375 g 100 mL/hr over 30 Minutes Intravenous  Once 09/13/19 1215 09/13/19 1350   09/13/19 1215  vancomycin (VANCOCIN) IVPB 1000 mg/200 mL premix  Status:  Discontinued  1,000 mg 200 mL/hr over 60 Minutes Intravenous  Once 09/13/19 1208 09/13/19 1215       Objective: Vitals:   09/18/19 0000 09/18/19 0347 09/18/19 0600 09/18/19 0830  BP: (!) 147/83 139/70 130/68 (!) 147/80  Pulse: 83 78 73 84  Resp: _0 Temp:  98 F (36.7 C)  99 F (37.2 C)  TempSrc:  Oral    SpO2: 91% 91% 93%   Weight:      Height:        Intake/Output Summary (Last 24 hours) at 09/18/2019 1107 Last data filed at 09/18/2019 0511 Gross per 24 hour  Intake 170 ml  Output 1710 ml  Net -1540 ml   Filed Weights   09/15/19 1130 09/16/19 0500 09/17/19 0500  Weight: (!) 138 kg (!) 141.4 kg (!) 142.3 kg    Examination: General exam: Appears calm and comfortable  Respiratory system: Clear to auscultation. Respiratory effort normal. Cardiovascular system: S1 & S2 heard, RRR. No pedal edema. Gastrointestinal system: Abdomen is nondistended, soft and nontender. Normal bowel sounds heard. Central nervous system: Alert and oriented. Non focal exam. Speech clear  Extremities: Left great toe  amputation, wound VAC in place Psychiatry: Judgement and insight appear stable. Mood & affect appropriate.   Data Reviewed: I have personally reviewed following labs and imaging studies  CBC: Recent Labs  Lab 09/13/19 1217 09/13/19 1228 09/14/19 0731 09/14/19 1835 09/15/19 0332 09/15/19 1830 09/16/19 0700 09/17/19 0448 09/18/19 0946  WBC 14.6*   < > 8.7  --  6.4  --  7.6 8.5 16.4*  NEUTROABS 12.8*  --   --   --   --   --   --   --   --   HGB 7.3*   < > 6.8*   < > 6.2* 8.7* 8.2* 8.4* 9.6*  HCT 23.9*   < > 22.9*   < > 20.6* 27.6* 26.2* 26.5* 30.7*  MCV 81.0   < > 80.6  --  81.7  --  81.6 82.3 81.6  PLT 191   < > 172  --  128*  --  156 186 268   < > = values in this interval not displayed.   Basic Metabolic Panel: Recent Labs  Lab 09/14/19 0731 09/14/19 0731 09/15/19 0332 09/15/19 0332 09/16/19 0700 09/16/19 0741 09/16/19 1441 09/17/19 0448 09/18/19 0946  NA 133*  --  132*  --  134*  --   --  133* 133*  K 4.4   < > 4.6   < > 6.2* 6.2* 5.4* 5.2* 5.7*  CL 103  --  104  --  105  --   --  102 101  CO2 19*  --  17*  --  18*  --   --  17* 16*  GLUCOSE 101*  --  159*  --  76  --   --  110* 148*  BUN 34*  --  38*  --  47*  --   --  51* 63*  CREATININE 3.17*  --  3.68*  --  4.51*  --   --  5.33* 5.95*  CALCIUM 8.2*  --  8.1*  --  8.5*  --   --  8.4* 8.4*   < > = values in this interval not displayed.   GFR: Estimated Creatinine Clearance: 18.3 mL/min (A) (by C-G formula based on SCr of 5.95 mg/dL (H)). Liver Function Tests: Recent Labs  Lab 09/13/19 1217 09/15/19 1830 09/16/19 0741  09/17/19 0448 09/18/19 0946  AST 15  --  _0 ALT 13  --  _1 ALKPHOS 158*  --  488* 616* 599*  BILITOT 0.4 1.9* 2.1* 2.1* 2.0*  PROT 5.5*  --  6.2* 6.3* 5.9*  ALBUMIN 1.7*  --  2.2* 1.9* 1.6*   No results for input(s): LIPASE, AMYLASE in the last 168 hours. No results for input(s): AMMONIA in the last 168 hours. Coagulation Profile: Recent Labs  Lab 09/13/19 1217  09/15/19 1830  INR 1.5* 1.3*   Cardiac Enzymes: No results for input(s): CKTOTAL, CKMB, CKMBINDEX, TROPONINI in the last 168 hours. BNP (last 3 results) No results for input(s): PROBNP in the last 8760 hours. HbA1C: No results for input(s): HGBA1C in the last 72 hours. CBG: Recent Labs  Lab 09/17/19 1419 09/17/19 1503 09/17/19 1625 09/17/19 2147 09/17/19 2327  GLUCAP 88 75 79 98 123*   Lipid Profile: No results for input(s): CHOL, HDL, LDLCALC, TRIG, CHOLHDL, LDLDIRECT in the last 72 hours. Thyroid Function Tests: No results for input(s): TSH, T4TOTAL, FREET4, T3FREE, THYROIDAB in the last 72 hours. Anemia Panel: Recent Labs    09/15/19 1830  VITAMINB12 962*  FOLATE 20.8  FERRITIN 122   Sepsis Labs: Recent Labs  Lab 09/13/19 1204  LATICACIDVEN 1.7    Recent Results (from the past 240 hour(s))  Culture, blood (Routine x 2)     Status: None   Collection Time: 09/13/19 12:58 PM   Specimen: BLOOD  Result Value Ref Range Status   Specimen Description BLOOD SITE NOT SPECIFIED  Final   Special Requests   Final    BOTTLES DRAWN AEROBIC AND ANAEROBIC Blood Culture results may not be optimal due to an excessive volume of blood received in culture bottles   Culture   Final    NO GROWTH 5 DAYS Performed at [facility], [place] [address] [place], [place] [address]    Report Status 09/18/2019 FINAL  Final  Culture, blood (Routine x 2)     Status: None   Collection Time: 09/13/19  1:08 PM   Specimen: BLOOD  Result Value Ref Range Status   Specimen Description BLOOD SITE NOT SPECIFIED  Final   Special Requests   Final    BOTTLES DRAWN AEROBIC AND ANAEROBIC Blood Culture adequate volume   Culture   Final    NO GROWTH 5 DAYS Performed at [facility], [place] [address] [place], [place] [address]    Report Status 09/18/2019 FINAL  Final  SARS Coronavirus 2 by RT PCR (hospital order, performed in [facility] hospital lab) Nasopharyngeal Nasopharyngeal Swab      Status: None   Collection Time: 09/13/19  2:02 PM   Specimen: Nasopharyngeal Swab  Result Value Ref Range Status   SARS Coronavirus 2 NEGATIVE NEGATIVE Final    Comment: (NOTE) SARS-CoV-2 target nucleic acids are NOT DETECTED.  The SARS-CoV-2 RNA is generally detectable in upper and lower respiratory specimens during the acute phase of infection. The lowest concentration of SARS-CoV-2 viral copies this assay can detect is 250 copies / mL. A negative result does not preclude SARS-CoV-2 infection and should not be used as the sole basis for treatment or other patient management decisions.  A negative result may occur with improper specimen collection / handling, submission of specimen other than nasopharyngeal swab, presence of viral mutation(s) within the areas targeted by this assay, and inadequate number of viral copies (<250 copies / mL). A  negative result must be combined with clinical observations, patient history, and epidemiological information.  Fact Sheet for Patients:   StrictlyIdeas.no  Fact Sheet for Healthcare Providers: BankingDealers.co.za  This test is not yet approved or  cleared by the [place] FDA and has been authorized for detection and/or diagnosis of SARS-CoV-2 by FDA under an Emergency Use Authorization (EUA).  This EUA will remain in effect (meaning this test can be used) for the duration of the COVID-19 declaration under Section 564(b)(1) of the Act, 21 U.S.C. section 360bbb-3(b)(1), unless the authorization is terminated or revoked sooner.  Performed at [facility], [place] [address] [place], [place] [address]   Urine culture     Status: Abnormal   Collection Time: 09/13/19  8:06 PM   Specimen: In/Out Cath Urine  Result Value Ref Range Status   Specimen Description IN/OUT CATH URINE  Final   Special Requests   Final    NONE Performed at [facility], [place] [address], [place], [place]  [address]    Culture MULTIPLE SPECIES PRESENT, SUGGEST RECOLLECTION (A)  Final   Report Status 09/14/2019 FINAL  Final  MRSA PCR Screening     Status: None   Collection Time: 09/15/19  4:55 AM   Specimen: Nasal Mucosa; Nasopharyngeal  Result Value Ref Range Status   MRSA by PCR NEGATIVE NEGATIVE Final    Comment:        The GeneXpert MRSA Assay (FDA approved for NASAL specimens only), is one component of a comprehensive MRSA colonization surveillance program. It is not intended to diagnose MRSA infection nor to guide or monitor treatment for MRSA infections. Performed at [facility], [place] [address] [place], [place] [address]   Aerobic/Anaerobic Culture (surgical/deep wound)     Status: None (Preliminary result)   Collection Time: 09/17/19  4:18 PM   Specimen: Soft Tissue, Other  Result Value Ref Range Status   Specimen Description TISSUE LEFT TOE  Final   Special Requests LEFT GREAT TOE  Final   Gram Stain   Final    MODERATE WBC PRESENT,BOTH PMN AND MONONUCLEAR FEW GRAM POSITIVE COCCI Performed at [facility], [place] [address] [place], [place] [address]    Culture PENDING  Incomplete   Report Status PENDING  Incomplete  Surgical PCR screen     Status: None   Collection Time: 09/17/19 10:21 PM   Specimen: Nasal Mucosa; Nasal Swab  Result Value Ref Range Status   MRSA, PCR NEGATIVE NEGATIVE Final   Staphylococcus aureus NEGATIVE NEGATIVE Final    Comment: (NOTE) The Xpert SA Assay (FDA approved for NASAL specimens in patients 75 years of age and older), is one component of a comprehensive surveillance program. It is not intended to diagnose infection nor to guide or monitor treatment. Performed at [facility], [place] [address], [place], [place] [address]       Radiology Studies: ECHOCARDIOGRAM COMPLETE  Result Date: 09/16/2019    ECHOCARDIOGRAM REPORT   Patient Name:   [name] Date of Exam: 09/16/2019 Medical Rec #:  [id number]        Height:        66.5 in Accession #:    [id number]       Weight:       311.7 lb Date of Birth:  07/16/1980         BSA:          2.429 m Patient Age:  39 years         BP:           117/59 mmHg Patient Gender: F                HR:           71 bpm. Exam Location:  Inpatient Procedure: 2D Echo Indications:    acute renal failure  History:        Patient has prior history of Echocardiogram examinations, most                 recent 01/29/2018. Chronic kidney disease. Sepsis.; Risk                 Factors:Diabetes and Hypertension.  Sonographer:    Johny Chess Referring Phys: [id number] [place]  1. Left ventricular ejection fraction, by estimation, is 45 to 50%. The left ventricle has mildly decreased function. The left ventricle demonstrates global hypokinesis. The left ventricular internal cavity size was mildly dilated. Left ventricular diastolic parameters were normal.  2. Right ventricular systolic function is normal. The right ventricular size is normal.  3. Left atrial size was moderately dilated.  4. The mitral valve is normal in structure. Trivial mitral valve regurgitation. No evidence of mitral stenosis.  5. The aortic valve is normal in structure. Aortic valve regurgitation is not visualized. No aortic stenosis is present.  6. The inferior vena cava is dilated in size with >50% respiratory variability, suggesting right atrial pressure of 8 mmHg. FINDINGS  Left Ventricle: Left ventricular ejection fraction, by estimation, is 45 to 50%. The left ventricle has mildly decreased function. The left ventricle demonstrates global hypokinesis. The left ventricular internal cavity size was mildly dilated. There is  no left ventricular hypertrophy. Left ventricular diastolic parameters were normal. Right Ventricle: The right ventricular size is normal. No increase in right ventricular wall thickness. Right ventricular systolic function is normal. Left Atrium: Left atrial size was moderately dilated. Right  Atrium: Right atrial size was normal in size. Pericardium: There is no evidence of pericardial effusion. Mitral Valve: The mitral valve is normal in structure. There is mild thickening of the mitral valve leaflet(s). Trivial mitral valve regurgitation. No evidence of mitral valve stenosis. Tricuspid Valve: The tricuspid valve is normal in structure. Tricuspid valve regurgitation is trivial. No evidence of tricuspid stenosis. Aortic Valve: The aortic valve is normal in structure. Aortic valve regurgitation is not visualized. No aortic stenosis is present. Pulmonic Valve: The pulmonic valve was normal in structure. Pulmonic valve regurgitation is not visualized. No evidence of pulmonic stenosis. Aorta: The aortic root is normal in size and structure. Venous: The inferior vena cava is dilated in size with greater than 50% respiratory variability, suggesting right atrial pressure of 8 mmHg. IAS/Shunts: No atrial level shunt detected by color flow Doppler.  LEFT VENTRICLE PLAX 2D LVIDd:         6.00 cm      Diastology LVIDs:         4.60 cm      LV e' medial:    7.07 cm/s LV PW:         1.30 cm      LV E/e' medial:  12.8 LV IVS:        1.10 cm      LV e' lateral:   7.83 cm/s  LV E/e' lateral: 11.5  LV Volumes (MOD) LV vol d, MOD A4C: 131.0 ml LV vol s, MOD A4C: 89.6 ml LV SV MOD A4C:     131.0 ml RIGHT VENTRICLE             IVC RV S prime:     15.10 cm/s  IVC diam: 2.70 cm TAPSE (M-mode): 2.3 cm LEFT ATRIUM             Index       RIGHT ATRIUM           Index LA diam:        4.40 cm 1.81 cm/m  RA Area:     10.90 cm LA Vol (A2C):   62.3 ml 25.65 ml/m RA Volume:   22.80 ml  9.39 ml/m LA Vol (A4C):   92.4 ml 38.05 ml/m LA Biplane Vol: 82.8 ml 34.10 ml/m  AORTIC VALVE LVOT Vmax:   102.00 cm/s LVOT Vmean:  67.200 cm/s LVOT VTI:    0.213 m  AORTA Ao Asc diam: 3.20 cm MITRAL VALVE MV Area (PHT): 3.72 cm    SHUNTS MV Decel Time: 204 msec    Systemic VTI: 0.21 m MV E velocity: 90.40 cm/s MV A  velocity: 77.10 cm/s MV E/A ratio:  1.17 Jenkins Rouge MD Electronically signed by Jenkins Rouge MD Signature Date/Time: 09/16/2019/3:49:03 PM    Final       Scheduled Meds: . sodium chloride   Intravenous Once  . Chlorhexidine Gluconate Cloth  6 each Topical Daily  . feeding supplement  1 Container Oral TID BM  . ferrous sulfate  325 mg Oral BID WC  . insulin aspart  0-15 Units Subcutaneous TID WC  . insulin aspart  0-5 Units Subcutaneous QHS  . insulin aspart  4 Units Subcutaneous TID WC  . insulin glargine  40 Units Subcutaneous QHS  . mupirocin ointment  1 application Nasal BID  . pregabalin  150 mg Oral QHS  . sodium bicarbonate  650 mg Oral TID  . sodium zirconium cyclosilicate  10 g Oral Daily   Continuous Infusions: . sodium chloride 500 mL/hr at 09/17/19 1605  . piperacillin-tazobactam (ZOSYN)  IV 3.375 g (09/18/19 1016)     LOS: 5 days      Time spent: 35 minutes   Dessa Phi, DO [facility] 09/18/2019, 11:07 AM   Available via Epic secure chat 7am-7pm After these hours, please refer to coverage provider listed on [email or web address]

## 2019-09-18 NOTE — Consult Note (Signed)
ORTHOPAEDIC CONSULTATION  REQUESTING PHYSICIAN: Dessa Phi, DO  Chief Complaint: Osteomyelitis ulceration abscess and cellulitis left foot.  HPI: Felicia Reynolds is a 39 y.o. female who presents with diabetic insensate neuropathy with uncontrolled type 2 diabetes with cellulitis ulceration osteomyelitis with abscess first ray left foot.  Patient is status post initial ray resection debridement and application of a wound VAC.  Patient is seen today in consultation for Dr. Erlinda Hong.  Past Medical History:  Diagnosis Date  . Anxiety   . Arthritis   . Bipolar disorder ([place])   . Diabetes ([place]) 06/05/2015  . Diabetes mellitus   . Fibromyalgia   . History of abnormal cervical Pap smear 05/29/2015  . History of diabetes mellitus, type II 05/29/2015  . Hypertension   . Neuropathy   . Obesity 05/29/2015  . Osteoporosis   . Ovarian cyst 02/20/2016  . Ovarian cyst 02/20/2016   Simple cyst rt ovary  . Panic attacks   . Pneumonia   . Stroke ([place])   . Superficial fungus infection of skin 05/29/2015  . Vitamin D deficiency 06/05/2015   Past Surgical History:  Procedure Laterality Date  . AMPUTATION Left 09/17/2019   Procedure: AMPUTATION LEFT GREAT TOE/ RAY WITH WOUND VAC APPLICATION;  Surgeon: Leandrew Koyanagi, MD;  Location: [facility];  Service: Orthopedics;  Laterality: Left;  . CESAREAN SECTION    . HERNIA REPAIR    . THROAT SURGERY     Social History   Socioeconomic History  . Marital status: Married    Spouse name: Not on file  . Number of children: Not on file  . Years of education: Not on file  . Highest education level: Not on file  Occupational History  . Not on file  Tobacco Use  . Smoking status: Current Some Day Smoker    Packs/day: 0.50    Years: 13.00    Pack years: 6.50    Types: Cigarettes  . Smokeless tobacco: Never Used  Vaping Use  . Vaping Use: Never used  Substance and Sexual Activity  . Alcohol use: No    Alcohol/week: 0.0 standard drinks  . Drug use: No  .  Sexual activity: Yes    Birth control/protection: None  Other Topics Concern  . Not on file  Social History Narrative   Has been in contact with social services for assistance with housing and medications,    Social Determinants of Health   Financial Resource Strain: Medium Risk  . Difficulty of Paying Living Expenses: Somewhat hard  Food Insecurity: Food Insecurity Present  . Worried About Charity fundraiser in the Last Year: Sometimes true  . Ran Out of Food in the Last Year: Sometimes true  Transportation Needs: Unmet Transportation Needs  . Lack of Transportation (Medical): Yes  . Lack of Transportation (Non-Medical): Yes  Physical Activity: Inactive  . Days of Exercise per Week: 0 days  . Minutes of Exercise per Session: 0 min  Stress: Stress Concern Present  . Feeling of Stress : Rather much  Social Connections: Moderately Isolated  . Frequency of Communication with Friends and Family: More than three times a week  . Frequency of Social Gatherings with Friends and Family: More than three times a week  . Attends Religious Services: Never  . Active Member of Clubs or Organizations: No  . Attends Archivist Meetings: Never  . Marital Status: Married   Family History  Problem Relation Age of Onset  . Cancer Mother 65  breast  . Breast cancer Mother   . Breast cancer Maternal Aunt   . Breast cancer Maternal Grandmother   . Cancer Maternal Aunt    - negative except otherwise stated in the family history section Allergies  Allergen Reactions  . Hydrocodone Itching  . Latex Itching and Rash   Prior to Admission medications   Medication Sig Start Date End Date Taking? Authorizing Provider  bismuth subsalicylate (PEPTO BISMOL) 262 MG/15ML suspension Take 30 mLs by mouth daily as needed for indigestion.   Yes [provider]  clonazePAM (KLONOPIN) 0.5 MG tablet Take 0.5 mg by mouth 2 (two) times daily as needed for anxiety. 08/13/19  Yes [provider]  cloNIDine (CATAPRES) 0.2 MG tablet Take 0.2 mg by mouth 2 (two) times daily.   Yes [provider]  diclofenac sodium (VOLTAREN) 1 % GEL Apply 2 g topically 4 (four) times daily as needed (pain).    Yes [provider]  furosemide (LASIX) 20 MG tablet Take 20 mg by mouth 2 (two) times daily as needed for edema. 04/21/19  Yes [provider]  hydrOXYzine (ATARAX/VISTARIL) 50 MG tablet Take 50 mg by mouth 2 (two) times daily as needed for anxiety. 06/09/19  Yes [provider]  insulin aspart (NOVOLOG) 100 UNIT/ML injection Inject 30 Units into the skin 3 (three) times daily with meals. Patient taking differently: Inject 2-30 Units into the skin 3 (three) times daily with meals. Per sliding scale: not provided 09/15/18  Yes Eure, Mertie Clause, MD  Insulin Glargine (LANTUS SOLOSTAR) 100 UNIT/ML Solostar Pen Inject 80 Units into the skin at bedtime. Patient taking differently: Inject 80 Units into the skin 2 (two) times daily. Inject -80 units under the skin twice daily. 09/15/18  Yes Florian Buff, MD  lisinopril-hydrochlorothiazide (ZESTORETIC) 20-12.5 MG tablet Take 1 tablet by mouth daily. 04/21/19  Yes [provider]  pregabalin (LYRICA) 200 MG capsule Take 200 mg by mouth 3 (three) times daily.    Yes [provider]  PROAIR HFA 108 (90 Base) MCG/ACT inhaler Inhale 1 puff into the lungs 4 (four) times daily as needed for wheezing or shortness of breath. 08/15/19  Yes [provider]  silver sulfADIAZINE (SILVADENE) 1 % cream APPLY TO AFFECTED AREA 2 OR 3 TIMES DAILY Patient taking differently: Apply 1 application topically 3 (three) times daily. APPLY TO AFFECTED AREA 2 OR 3 TIMES DAILY 03/30/19  Yes Florian Buff, MD  azithromycin (ZITHROMAX) 500 MG tablet Take 1 tablet (500 mg total) by mouth daily. Patient not taking: Reported on 06/11/2019 11/25/18   Orson Eva, MD  cefdinir (OMNICEF) 300 MG capsule Take 1 capsule (300 mg  total) by mouth 2 (two) times daily. Patient not taking: Reported on 09/13/2019 11/25/18   Orson Eva, MD  megestrol (MEGACE) 40 MG tablet Take 1 tablet (40 mg total) by mouth 3 (three) times daily as needed (regulate period). Patient not taking: Reported on 09/13/2019 12/05/18   Florian Buff, MD  NOVOLOG FLEXPEN 100 UNIT/ML FlexPen INJECT 30 UNITS SUBCUTANEOUSLY 3 TIMES A DAY WITH MEALS Patient not taking: Reported on 09/13/2019 09/07/19   Florian Buff, MD  rosuvastatin (CRESTOR) 20 MG tablet Take 20 mg by mouth at bedtime. 06/21/19   [provider]  SUBOXONE 8-2 MG FILM Place 2 Film under the tongue daily.  Patient not taking: Reported on 09/13/2019 11/17/18   [provider]  tiZANidine (ZANAFLEX) 4 MG capsule Take 4 mg by mouth 3 (  three) times daily. Patient not taking: Reported on 09/13/2019    [provider]   ECHOCARDIOGRAM COMPLETE  Result Date: 09/16/2019    ECHOCARDIOGRAM REPORT   Patient Name:   Felicia Reynolds Date of Exam: 09/16/2019 Medical Rec #:  [id number]        Height:       66.5 in Accession #:    [id number]       Weight:       311.7 lb Date of Birth:  1980-11-07         BSA:          2.429 m Patient Age:    32 years         BP:           117/59 mmHg Patient Gender: F                HR:           71 bpm. Exam Location:  Inpatient Procedure: 2D Echo Indications:    acute renal failure  History:        Patient has prior history of Echocardiogram examinations, most                 recent 01/29/2018. Chronic kidney disease. Sepsis.; Risk                 Factors:Diabetes and Hypertension.  Sonographer:    Johny Chess Referring Phys: [id number]  1. Left ventricular ejection fraction, by estimation, is 45 to 50%. The left ventricle has mildly decreased function. The left ventricle demonstrates global hypokinesis. The left ventricular internal cavity size was mildly dilated. Left ventricular diastolic parameters were normal.  2. Right ventricular  systolic function is normal. The right ventricular size is normal.  3. Left atrial size was moderately dilated.  4. The mitral valve is normal in structure. Trivial mitral valve regurgitation. No evidence of mitral stenosis.  5. The aortic valve is normal in structure. Aortic valve regurgitation is not visualized. No aortic stenosis is present.  6. The inferior vena cava is dilated in size with >50% respiratory variability, suggesting right atrial pressure of 8 mmHg. FINDINGS  Left Ventricle: Left ventricular ejection fraction, by estimation, is 45 to 50%. The left ventricle has mildly decreased function. The left ventricle demonstrates global hypokinesis. The left ventricular internal cavity size was mildly dilated. There is  no left ventricular hypertrophy. Left ventricular diastolic parameters were normal. Right Ventricle: The right ventricular size is normal. No increase in right ventricular wall thickness. Right ventricular systolic function is normal. Left Atrium: Left atrial size was moderately dilated. Right Atrium: Right atrial size was normal in size. Pericardium: There is no evidence of pericardial effusion. Mitral Valve: The mitral valve is normal in structure. There is mild thickening of the mitral valve leaflet(s). Trivial mitral valve regurgitation. No evidence of mitral valve stenosis. Tricuspid Valve: The tricuspid valve is normal in structure. Tricuspid valve regurgitation is trivial. No evidence of tricuspid stenosis. Aortic Valve: The aortic valve is normal in structure. Aortic valve regurgitation is not visualized. No aortic stenosis is present. Pulmonic Valve: The pulmonic valve was normal in structure. Pulmonic valve regurgitation is not visualized. No evidence of pulmonic stenosis. Aorta: The aortic root is normal in size and structure. Venous: The inferior vena cava is dilated in size with greater than 50% respiratory variability, suggesting right atrial pressure of 8 mmHg. IAS/Shunts: No  atrial level shunt detected by color flow Doppler.  LEFT VENTRICLE PLAX 2D LVIDd:         6.00 cm      Diastology LVIDs:         4.60 cm      LV e' medial:    7.07 cm/s LV PW:         1.30 cm      LV E/e' medial:  12.8 LV IVS:        1.10 cm      LV e' lateral:   7.83 cm/s                             LV E/e' lateral: 11.5  LV Volumes (MOD) LV vol d, MOD A4C: 131.0 ml LV vol s, MOD A4C: 89.6 ml LV SV MOD A4C:     131.0 ml RIGHT VENTRICLE             IVC RV S prime:     15.10 cm/s  IVC diam: 2.70 cm TAPSE (M-mode): 2.3 cm LEFT ATRIUM             Index       RIGHT ATRIUM           Index LA diam:        4.40 cm 1.81 cm/m  RA Area:     10.90 cm LA Vol (A2C):   62.3 ml 25.65 ml/m RA Volume:   22.80 ml  9.39 ml/m LA Vol (A4C):   92.4 ml 38.05 ml/m LA Biplane Vol: 82.8 ml 34.10 ml/m  AORTIC VALVE LVOT Vmax:   102.00 cm/s LVOT Vmean:  67.200 cm/s LVOT VTI:    0.213 m  AORTA Ao Asc diam: 3.20 cm MITRAL VALVE MV Area (PHT): 3.72 cm    SHUNTS MV Decel Time: 204 msec    Systemic VTI: 0.21 m MV E velocity: 90.40 cm/s MV A velocity: 77.10 cm/s MV E/A ratio:  1.17 Jenkins Rouge MD Electronically signed by Jenkins Rouge MD Signature Date/Time: 09/16/2019/3:49:03 PM    Final    - pertinent xrays, CT, MRI studies were reviewed and independently interpreted  Positive ROS: All other systems have been reviewed and were otherwise negative with the exception of those mentioned in the HPI and as above.  Physical Exam: General: Alert, no acute distress Psychiatric: Patient is competent for consent with normal mood and affect Lymphatic: No axillary or cervical lymphadenopathy Cardiovascular: No pedal edema Respiratory: No cyanosis, no use of accessory musculature GI: No organomegaly, abdomen is soft and non-tender    Images:  @ENCIMAGES @  Labs:  Lab Results  Component Value Date   HGBA1C 9.0 (H) 09/13/2019   HGBA1C 8.3 (H) 11/25/2018   HGBA1C 8.4 (H) 11/24/2018   REPTSTATUS PENDING 09/17/2019   GRAMSTAIN   09/17/2019    MODERATE WBC PRESENT,BOTH PMN AND MONONUCLEAR FEW GRAM POSITIVE COCCI    CULT  09/17/2019    CULTURE REINCUBATED FOR BETTER GROWTH Performed at [facility], [address] [place], [place] [address]    LABORGA ESCHERICHIA COLI 11/28/2011    Lab Results  Component Value Date   ALBUMIN 1.6 (L) 09/18/2019   ALBUMIN 1.9 (L) 09/17/2019   ALBUMIN 2.2 (L) 09/16/2019    Neurologic: Patient does not have protective sensation bilateral lower extremities.   MUSCULOSKELETAL:   Skin: Examination there is cellulitis around the wound the wound is packed open with a wound VAC black sponge.  There is cellulitis around  the skin extending into the hindfoot.  There is minimal serosanguineous drainage in the wound VAC canister.  Patient has a strong dorsalis pedis pulse no arterial insufficiency  BMI approximately 50 with an albumin of 1.6 and a hemoglobin A1c of 9.0.  Assessment: Assessment: Uncontrolled type 2 diabetes with severe protein caloric malnutrition with abscess ulceration and osteomyelitis left foot.  Plan: Plan: Recommended proceeding with foot salvage intervention we will try further debridement and potential wound closure at this time patient may require repeat surgery on Friday.  Discussed that she is at risk for a below the knee amputation but will aggressively proceed with foot salvage intervention.  Plan for left foot revision first ray amputation and possible wound closure tomorrow Wednesday.  Thank you for the consult and the opportunity to see Ms. Clearence Ped, MD [place] [phone] 1:31 PM

## 2019-09-19 ENCOUNTER — Inpatient Hospital Stay (HOSPITAL_COMMUNITY): Payer: Medicaid Other | Admitting: Certified Registered Nurse Anesthetist

## 2019-09-19 ENCOUNTER — Encounter (HOSPITAL_COMMUNITY): Payer: Self-pay | Admitting: Internal Medicine

## 2019-09-19 ENCOUNTER — Encounter (HOSPITAL_COMMUNITY)
Admission: EM | Discharge status: Against Medical Advice | Payer: Self-pay | Source: Home / Self Care | Attending: Internal Medicine

## 2019-09-19 DIAGNOSIS — M86272 Subacute osteomyelitis, left ankle and foot: Secondary | ICD-10-CM

## 2019-09-19 HISTORY — PX: AMPUTATION: SHX166

## 2019-09-19 LAB — COMPREHENSIVE METABOLIC PANEL
ALT: 8 U/L (ref 0–44)
AST: 20 U/L (ref 15–41)
Albumin: 1.7 g/dL — ABNORMAL LOW (ref 3.5–5.0)
Alkaline Phosphatase: 547 U/L — ABNORMAL HIGH (ref 38–126)
Anion gap: 16 — ABNORMAL HIGH (ref 5–15)
BUN: 74 mg/dL — ABNORMAL HIGH (ref 6–20)
CO2: 15 mmol/L — ABNORMAL LOW (ref 22–32)
Calcium: 8.2 mg/dL — ABNORMAL LOW (ref 8.9–10.3)
Chloride: 102 mmol/L (ref 98–111)
Creatinine, Ser: 6.18 mg/dL — ABNORMAL HIGH (ref 0.44–1.00)
GFR calc Af Amer: 9 mL/min — ABNORMAL LOW (ref >60)
GFR calc non Af Amer: 8 mL/min — ABNORMAL LOW (ref >60)
Glucose, Bld: 261 mg/dL — ABNORMAL HIGH (ref 70–99)
Potassium: 6 mmol/L — ABNORMAL HIGH (ref 3.5–5.1)
Sodium: 133 mmol/L — ABNORMAL LOW (ref 135–145)
Total Bilirubin: 1.1 mg/dL (ref 0.3–1.2)
Total Protein: 6 g/dL — ABNORMAL LOW (ref 6.5–8.1)

## 2019-09-19 LAB — CBC
HCT: 30.1 % — ABNORMAL LOW (ref 36.0–46.0)
Hemoglobin: 9.7 g/dL — ABNORMAL LOW (ref 12.0–15.0)
MCH: 26 pg (ref 26.0–34.0)
MCHC: 32.2 g/dL (ref 30.0–36.0)
MCV: 80.7 fL (ref 80.0–100.0)
Platelets: 303 10*3/uL (ref 150–400)
RBC: 3.73 MIL/uL — ABNORMAL LOW (ref 3.87–5.11)
RDW: 19.9 % — ABNORMAL HIGH (ref 11.5–15.5)
WBC: 14.4 10*3/uL — ABNORMAL HIGH (ref 4.0–10.5)
nRBC: 0 % (ref 0.0–0.2)

## 2019-09-19 LAB — GLUCOSE, CAPILLARY
Glucose-Capillary: 226 mg/dL — ABNORMAL HIGH (ref 70–99)
Glucose-Capillary: 274 mg/dL — ABNORMAL HIGH (ref 70–99)
Glucose-Capillary: 232 mg/dL — ABNORMAL HIGH (ref 70–99)
Glucose-Capillary: 185 mg/dL — ABNORMAL HIGH (ref 70–99)
Glucose-Capillary: 185 mg/dL — ABNORMAL HIGH (ref 70–99)

## 2019-09-19 LAB — HEPATITIS B SURFACE ANTIBODY,QUALITATIVE: Hep B S Ab: NONREACTIVE

## 2019-09-19 LAB — POTASSIUM: Potassium: 5.5 mmol/L — ABNORMAL HIGH (ref 3.5–5.1)

## 2019-09-19 LAB — HEPATITIS B SURFACE ANTIGEN: Hepatitis B Surface Ag: NONREACTIVE

## 2019-09-19 LAB — HEPATITIS B CORE ANTIBODY, TOTAL: Hep B Core Total Ab: NONREACTIVE

## 2019-09-19 SURGERY — AMPUTATION, FOOT, RAY
Anesthesia: General | Laterality: Left

## 2019-09-19 MED ORDER — LIDOCAINE 2% (20 MG/ML) 5 ML SYRINGE
INTRAMUSCULAR | Status: AC
  Filled 2019-09-19: qty 5

## 2019-09-19 MED ORDER — ONDANSETRON HCL 4 MG PO TABS: 4.0000 mg | ORAL_TABLET | Freq: Four times a day (QID) | ORAL | Status: DC | PRN

## 2019-09-19 MED ORDER — DOCUSATE SODIUM 100 MG PO CAPS
100.0000 mg | ORAL_CAPSULE | Freq: Two times a day (BID) | ORAL | Status: DC
  Administered 2019-09-19 – 2019-09-24 (×8): 100 mg via ORAL
  Filled 2019-09-19 (×9): qty 1

## 2019-09-19 MED ORDER — INSULIN ASPART 100 UNIT/ML ~~LOC~~ SOLN
5.0000 [IU] | Freq: Three times a day (TID) | SUBCUTANEOUS | Status: DC
  Administered 2019-09-19: 5 [IU] via SUBCUTANEOUS

## 2019-09-19 MED ORDER — DEXTROSE 5 % IV SOLN
3.0000 g | INTRAVENOUS | Status: DC
  Filled 2019-09-19: qty 3000

## 2019-09-19 MED ORDER — SODIUM CHLORIDE 0.9 % IV SOLN: 100.0000 mL | INTRAVENOUS | Status: DC | PRN

## 2019-09-19 MED ORDER — MIDAZOLAM HCL 5 MG/5ML IJ SOLN: INTRAMUSCULAR | Status: DC | PRN

## 2019-09-19 MED ORDER — PROMETHAZINE HCL 25 MG/ML IJ SOLN: 6.2500 mg | INTRAMUSCULAR | Status: DC | PRN

## 2019-09-19 MED ORDER — HYDROMORPHONE HCL 1 MG/ML IJ SOLN
INTRAMUSCULAR | Status: AC
  Filled 2019-09-19: qty 1

## 2019-09-19 MED ORDER — LIDOCAINE HCL (PF) 1 % IJ SOLN: 5.0000 mL | INTRAMUSCULAR | Status: DC | PRN

## 2019-09-19 MED ORDER — MEPERIDINE HCL 25 MG/ML IJ SOLN: 6.2500 mg | INTRAMUSCULAR | Status: DC | PRN

## 2019-09-19 MED ORDER — FUROSEMIDE 10 MG/ML IJ SOLN
80.0000 mg | Freq: Once | INTRAMUSCULAR | Status: AC
  Administered 2019-09-19: 80 mg via INTRAVENOUS
  Filled 2019-09-19: qty 8

## 2019-09-19 MED ORDER — METOCLOPRAMIDE HCL 10 MG PO TABS: 5.0000 mg | ORAL_TABLET | Freq: Three times a day (TID) | ORAL | Status: DC | PRN

## 2019-09-19 MED ORDER — ONDANSETRON HCL 4 MG/2ML IJ SOLN
INTRAMUSCULAR | Status: DC | PRN
  Administered 2019-09-19: 4 mg via INTRAVENOUS

## 2019-09-19 MED ORDER — HYDROMORPHONE HCL 1 MG/ML IJ SOLN: 0.5000 mg | INTRAMUSCULAR | Status: DC | PRN

## 2019-09-19 MED ORDER — PENTAFLUOROPROP-TETRAFLUOROETH EX AERO: 1.0000 "application " | INHALATION_SPRAY | CUTANEOUS | Status: DC | PRN

## 2019-09-19 MED ORDER — ONDANSETRON HCL 4 MG/2ML IJ SOLN
INTRAMUSCULAR | Status: AC
  Filled 2019-09-19: qty 2

## 2019-09-19 MED ORDER — CLONIDINE HCL 0.1 MG PO TABS
0.1000 mg | ORAL_TABLET | Freq: Two times a day (BID) | ORAL | Status: DC
  Administered 2019-09-19 – 2019-09-24 (×9): 0.1 mg via ORAL
  Filled 2019-09-19 (×10): qty 1

## 2019-09-19 MED ORDER — MIDAZOLAM HCL 2 MG/2ML IJ SOLN: 0.5000 mg | Freq: Once | INTRAMUSCULAR | Status: DC | PRN

## 2019-09-19 MED ORDER — OXYCODONE HCL 5 MG/5ML PO SOLN: 5.0000 mg | Freq: Once | ORAL | Status: DC | PRN

## 2019-09-19 MED ORDER — MIDAZOLAM HCL 2 MG/2ML IJ SOLN
INTRAMUSCULAR | Status: AC
  Filled 2019-09-19: qty 2

## 2019-09-19 MED ORDER — PROPOFOL 10 MG/ML IV BOLUS
INTRAVENOUS | Status: DC | PRN
  Administered 2019-09-19: 140 mg via INTRAVENOUS

## 2019-09-19 MED ORDER — ONDANSETRON HCL 4 MG/2ML IJ SOLN: 4.0000 mg | Freq: Four times a day (QID) | INTRAMUSCULAR | Status: DC | PRN

## 2019-09-19 MED ORDER — PHENYLEPHRINE 40 MCG/ML (10ML) SYRINGE FOR IV PUSH (FOR BLOOD PRESSURE SUPPORT)
PREFILLED_SYRINGE | INTRAVENOUS | Status: DC | PRN
  Administered 2019-09-19 (×2): 80 ug via INTRAVENOUS

## 2019-09-19 MED ORDER — INSULIN ASPART 100 UNIT/ML ~~LOC~~ SOLN
SUBCUTANEOUS | Status: AC
  Filled 2019-09-19: qty 1

## 2019-09-19 MED ORDER — HEPARIN SODIUM (PORCINE) 1000 UNIT/ML DIALYSIS: 1000.0000 [IU] | INTRAMUSCULAR | Status: DC | PRN

## 2019-09-19 MED ORDER — FENTANYL CITRATE (PF) 100 MCG/2ML IJ SOLN
INTRAMUSCULAR | Status: AC
  Filled 2019-09-19: qty 2

## 2019-09-19 MED ORDER — HYDROMORPHONE HCL 1 MG/ML IJ SOLN
0.2500 mg | INTRAMUSCULAR | Status: DC | PRN
  Administered 2019-09-19 (×2): 0.5 mg via INTRAVENOUS

## 2019-09-19 MED ORDER — INSULIN GLARGINE 100 UNIT/ML ~~LOC~~ SOLN
45.0000 [IU] | Freq: Every day | SUBCUTANEOUS | Status: DC
  Administered 2019-09-19 – 2019-09-20 (×2): 45 [IU] via SUBCUTANEOUS
  Filled 2019-09-19 (×3): qty 0.45

## 2019-09-19 MED ORDER — LIDOCAINE-PRILOCAINE 2.5-2.5 % EX CREA
1.0000 "application " | TOPICAL_CREAM | CUTANEOUS | Status: DC | PRN
  Filled 2019-09-19: qty 5

## 2019-09-19 MED ORDER — ALTEPLASE 2 MG IJ SOLR: 2.0000 mg | Freq: Once | INTRAMUSCULAR | Status: DC | PRN

## 2019-09-19 MED ORDER — SODIUM BICARBONATE 8.4 % IV SOLN
50.0000 meq | Freq: Once | INTRAVENOUS | Status: AC
  Administered 2019-09-19: 50 meq via INTRAVENOUS
  Filled 2019-09-19: qty 50

## 2019-09-19 MED ORDER — FENTANYL CITRATE (PF) 250 MCG/5ML IJ SOLN
INTRAMUSCULAR | Status: AC
  Filled 2019-09-19: qty 5

## 2019-09-19 MED ORDER — CHLORHEXIDINE GLUCONATE CLOTH 2 % EX PADS
6.0000 | MEDICATED_PAD | Freq: Every day | CUTANEOUS | Status: DC
  Administered 2019-09-19 – 2019-09-22 (×4): 6 via TOPICAL

## 2019-09-19 MED ORDER — 0.9 % SODIUM CHLORIDE (POUR BTL) OPTIME
TOPICAL | Status: DC | PRN
  Administered 2019-09-19: 1000 mL

## 2019-09-19 MED ORDER — OXYCODONE HCL 5 MG PO TABS: 5.0000 mg | ORAL_TABLET | Freq: Once | ORAL | Status: DC | PRN

## 2019-09-19 MED ORDER — LIDOCAINE 2% (20 MG/ML) 5 ML SYRINGE
INTRAMUSCULAR | Status: DC | PRN
  Administered 2019-09-19: 20 mg via INTRAVENOUS

## 2019-09-19 MED ORDER — METOCLOPRAMIDE HCL 5 MG/ML IJ SOLN: 5.0000 mg | Freq: Three times a day (TID) | INTRAMUSCULAR | Status: DC | PRN

## 2019-09-19 MED ORDER — SODIUM CHLORIDE 0.9 % IV SOLN: INTRAVENOUS | Status: DC

## 2019-09-19 MED ORDER — PROPOFOL 10 MG/ML IV BOLUS
INTRAVENOUS | Status: AC
  Filled 2019-09-19: qty 20

## 2019-09-19 SURGICAL SUPPLY — 36 items
BLADE SAW SGTL MED 73X18.5 STR (BLADE) IMPLANT
BLADE SURG 21 STRL SS (BLADE) ×3 IMPLANT
BNDG COHESIVE 4X5 TAN STRL (GAUZE/BANDAGES/DRESSINGS) ×3 IMPLANT
BNDG COHESIVE 6X5 TAN STRL LF (GAUZE/BANDAGES/DRESSINGS) ×2 IMPLANT
BNDG GAUZE ELAST 4 BULKY (GAUZE/BANDAGES/DRESSINGS) ×3 IMPLANT
CANISTER WOUND CARE 500ML ATS (WOUND CARE) ×2 IMPLANT
COVER SURGICAL LIGHT HANDLE (MISCELLANEOUS) ×6 IMPLANT
COVER WAND RF STERILE (DRAPES) ×3 IMPLANT
DRAPE DERMATAC (DRAPES) ×4 IMPLANT
DRAPE INCISE IOBAN 66X45 STRL (DRAPES) ×2 IMPLANT
DRAPE U-SHAPE 47X51 STRL (DRAPES) ×6 IMPLANT
DRESSING PEEL AND PLAC PRVNA20 (GAUZE/BANDAGES/DRESSINGS) IMPLANT
DRSG ADAPTIC 3X8 NADH LF (GAUZE/BANDAGES/DRESSINGS) ×3 IMPLANT
DRSG EMULSION OIL 3X3 NADH (GAUZE/BANDAGES/DRESSINGS) ×2 IMPLANT
DRSG PAD ABDOMINAL 8X10 ST (GAUZE/BANDAGES/DRESSINGS) ×4 IMPLANT
DRSG PEEL AND PLACE PREVENA 20 (GAUZE/BANDAGES/DRESSINGS) ×3
DURAPREP 26ML APPLICATOR (WOUND CARE) ×3 IMPLANT
ELECT REM PT RETURN 9FT ADLT (ELECTROSURGICAL) ×3
ELECTRODE REM PT RTRN 9FT ADLT (ELECTROSURGICAL) ×1 IMPLANT
GAUZE SPONGE 4X4 12PLY STRL (GAUZE/BANDAGES/DRESSINGS) ×3 IMPLANT
GLOVE BIOGEL PI IND STRL 9 (GLOVE) ×1 IMPLANT
GLOVE BIOGEL PI INDICATOR 9 (GLOVE) ×2
GLOVE SURG ORTHO 9.0 STRL STRW (GLOVE) ×3 IMPLANT
GOWN STRL REUS W/ TWL XL LVL3 (GOWN DISPOSABLE) ×2 IMPLANT
GOWN STRL REUS W/TWL XL LVL3 (GOWN DISPOSABLE) ×6
KIT BASIN OR (CUSTOM PROCEDURE TRAY) ×3 IMPLANT
KIT TURNOVER KIT B (KITS) ×3 IMPLANT
NS IRRIG 1000ML POUR BTL (IV SOLUTION) ×3 IMPLANT
PACK ORTHO EXTREMITY (CUSTOM PROCEDURE TRAY) ×3 IMPLANT
PAD ARMBOARD 7.5X6 YLW CONV (MISCELLANEOUS) ×6 IMPLANT
STOCKINETTE IMPERVIOUS LG (DRAPES) IMPLANT
SUT ETHILON 2 0 PSLX (SUTURE) ×3 IMPLANT
TOWEL GREEN STERILE (TOWEL DISPOSABLE) ×3 IMPLANT
TUBE CONNECTING 12'X1/4 (SUCTIONS) ×1
TUBE CONNECTING 12X1/4 (SUCTIONS) ×2 IMPLANT
YANKAUER SUCT BULB TIP NO VENT (SUCTIONS) ×3 IMPLANT

## 2019-09-19 NOTE — Progress Notes (Signed)
Orthopedic Tech Progress Note Patient Details:  Felicia Reynolds [birth year] [id number]  Ortho Devices Type of Ortho Device: Postop shoe/boot Ortho Device/Splint Location: LLE Ortho Device/Splint Interventions: Ordered, Application, Adjustment   Post Interventions Patient Tolerated: Well Instructions Provided: Care of device   Janit Pagan 09/19/2019, 5:40 PM

## 2019-09-19 NOTE — Transfer of Care (Signed)
Immediate Anesthesia Transfer of Care Note  Patient: [name]  Procedure(s) Performed: LEFT FOOT 1ST RAY AMPUTATION (Left )  Patient Location: PACU  Anesthesia Type:General  Level of Consciousness: patient cooperative and responds to stimulation  Airway & Oxygen Therapy: Patient Spontanous Breathing and Patient connected to nasal cannula oxygen  Post-op Assessment: Report given to RN and Post -op Vital signs reviewed and stable  Post vital signs: Reviewed and stable  Last Vitals:  Vitals Value Taken Time  BP 131/66 09/19/19 1224  Temp    Pulse 77 09/19/19 1227  Resp 18 09/19/19 1227  SpO2 98 % 09/19/19 1227  Vitals shown include unvalidated device data.  Last Pain:  Vitals:   09/19/19 1027  TempSrc:   PainSc: 2       Patients Stated Pain Goal: 3 (66/81/59 4707)  Complications: No complications documented.

## 2019-09-19 NOTE — Progress Notes (Signed)
PT Cancellation Note  Patient Details Name: Felicia Reynolds MRN: [id number] DOB: 25-Apr-1980   Cancelled Treatment:    Reason Eval/Treat Not Completed: Patient at procedure or test/unavailable; patient out of the room to surgery, will attempt again another day.   Reginia Naas 09/19/2019, 11:53 AM  Magda Kiel, PT Acute Rehabilitation Services BPJPE:[phone] [phone] 09/19/2019

## 2019-09-19 NOTE — Interval H&P Note (Signed)
History and Physical Interval Note:  09/19/2019 6:58 AM  Felicia Reynolds  has presented today for surgery, with the diagnosis of Left Foot Osteomyelitis.  The various methods of treatment have been discussed with the patient and family. After consideration of risks, benefits and other options for treatment, the patient has consented to  Procedure(s): LEFT FOOT 1ST RAY AMPUTATION (Left) as a surgical intervention.  The patient's history has been reviewed, patient examined, no change in status, stable for surgery.  I have reviewed the patient's chart and labs.  Questions were answered to the patient's satisfaction.     Newt Minion

## 2019-09-19 NOTE — Anesthesia Preprocedure Evaluation (Signed)
Anesthesia Evaluation  Patient identified by MRN, date of birth, ID band Patient awake    Reviewed: Allergy & Precautions, NPO status , Patient's Chart, lab work & pertinent test results  History of Anesthesia Complications Negative for: history of anesthetic complications  Airway Mallampati: II  TM Distance: >3 FB Neck ROM: Full    Dental  (+) Poor Dentition, Dental Advisory Given, Missing, Chipped   Pulmonary Current Smoker,  09/13/2019 SARS coronavirus neg   breath sounds clear to auscultation       Cardiovascular hypertension, (-) angina Rhythm:Regular Rate:Normal     Neuro/Psych Anxiety Bipolar Disorder Diabetic peripheral neuropathy CVA    GI/Hepatic negative GI ROS, (+)     substance abuse (suboxone)  ,   Endo/Other  diabetes (glu 274)Morbid obesity  Renal/GU Renal InsufficiencyRenal disease     Musculoskeletal  (+) Arthritis ,   Abdominal (+) + obese,   Peds  Hematology  (+) Blood dyscrasia (Hb 9.7), anemia ,   Anesthesia Other Findings   Reproductive/Obstetrics                             Anesthesia Physical Anesthesia Plan  ASA: III  Anesthesia Plan: General   Post-op Pain Management:    Induction: Intravenous  PONV Risk Score and Plan: 2 and Ondansetron and Dexamethasone  Airway Management Planned: LMA  Additional Equipment: None  Intra-op Plan:   Post-operative Plan:   Informed Consent: I have reviewed the patients History and Physical, chart, labs and discussed the procedure including the risks, benefits and alternatives for the proposed anesthesia with the patient or authorized representative who has indicated his/her understanding and acceptance.     Dental advisory given  Plan Discussed with: CRNA and Surgeon  Anesthesia Plan Comments:         Anesthesia Quick Evaluation

## 2019-09-19 NOTE — Anesthesia Procedure Notes (Signed)
Procedure Name: LMA Insertion Date/Time: 09/19/2019 11:43 AM Performed by: Michele Rockers, CRNA Pre-anesthesia Checklist: Patient identified, Emergency Drugs available, Suction available and Patient being monitored Patient Re-evaluated:Patient Re-evaluated prior to induction Oxygen Delivery Method: Circle system utilized Preoxygenation: Pre-oxygenation with 100% oxygen Induction Type: IV induction Ventilation: Mask ventilation without difficulty LMA: LMA inserted LMA Size: 4.0 Airway Equipment and Method: Oral airway Placement Confirmation: positive ETCO2 and breath sounds checked- equal and bilateral Tube secured with: Tape Dental Injury: Teeth and Oropharynx as per pre-operative assessment

## 2019-09-19 NOTE — Progress Notes (Addendum)
[facility] Progress Note  Subjective:  She had 900 mL uop over 9/14.  We discussed the risks/benefits/indications for dialysis again today and she consents to dialysis.  "whatever it takes to be with my family".  Per RN, she is for surgery again today at 76 - maybe an hour - she just found out.   Review of systems:   She denies shortness of breath   Denies n/v Denies Cp  Has been NPO - no breakfast yet     Vitals:   09/19/19 0327 09/19/19 0400 09/19/19 0500 09/19/19 0800  BP: 133/67 123/65  (!) 165/80  Pulse: 65 65 71 72  Resp: 18   17  Temp: 98 F (36.7 C)   97.9 F (36.6 C)  TempSrc: Oral   Oral  SpO2: 96% 97% 96% 96%  Weight:      Height:        Exam: Gen adult female in bed in NAD at rest  NCAT Sclera anicteric Clear anteriorly and unlabored  S1S2 no rub Abd soft ntnd obese habitus GU foley in place Ext trace edema,  L foot sig gangrenous changes Neuro is awake and conversant, follows commands   Home meds:  - clonidine 0.2 bid/ crestor 20/ lasix 20 bid prn/ lisinopril-hctz 20-12.5 qd  - suboxone 8/2 two films qd/ zanaflex 4 tid/ lyrica 200 tid/ klonopin 0.5 bid prn  - insulin aspart 20u tid ac/ lantus 80u hs  - megace 40 tid prn  - prn's/ vitamins/ supplements    UA 9/9 - >50 wbc, 11-20 epi, 0-5 rbc, many bact, >300 prot, turbid   CXR 9/9 - IMPRESSION: Lungs clear. Heart borderline enlarged. No adenopathy.      Assessment/ Plan:  1. AoCKD 3b - b/l creat 1.7 from 10d ago, eGFR 35 ml/min.  AKI in setting of gangrenous foot vs possible infectious GN with chronic (> 9mo) and severe foot infection; also with volume overload.  Note had rec'd 3.5 gm IV vanc, unlikely to cause AKI. DC'd IV vanc.  +proteinuria > 300.  UPC ratio with 3150 mg/g - may be secondary to DM as well  1. Will consult IR for tunneled HD catheter  2. For HD today, 9/15 after access placement.  Plan for HD on 9/16 as well then TTS the rest of this week   3. Continue foley    2. Hyperkalemia - despite lokelma daily and additional kayexalate on 9/14. Bicarb once now  3. Gangrene/ osteo L foot - IV abx and s/p amputation. For surgery again today  4. IDDM - per primary team.  With proteinuria.   5. Bipolar d/o - per primary team  6. HTN - home lisinopril/ hctz are on hold appropriately.  Off home clonidine - will restart at reduced dose of 0.1 mg BID 7. Metabolic acidosis - on bicarb  8. Normocytic anemia - no acute indication for PRBC's 9. Obesity   Recent Labs  Lab 09/18/19 0946 09/18/19 0946 09/18/19 1612 09/19/19 0607  K 5.7*   < > 5.7* 6.0*  BUN 63*  --   --  74*  CREATININE 5.95*  --   --  6.18*  CALCIUM 8.4*  --   --  8.2*  HGB 9.6*  --   --  9.7*   < > = values in this interval not displayed.   Inpatient medications: . Chlorhexidine Gluconate Cloth  6 each Topical Daily  . feeding supplement  1 Container Oral TID BM  . ferrous  sulfate  325 mg Oral BID WC  . insulin aspart  0-15 Units Subcutaneous TID WC  . insulin aspart  0-5 Units Subcutaneous QHS  . insulin aspart  4 Units Subcutaneous TID WC  . insulin glargine  40 Units Subcutaneous QHS  . mupirocin ointment  1 application Nasal BID  . pregabalin  150 mg Oral QHS  . sodium bicarbonate  50 mEq Intravenous Once  . sodium bicarbonate  650 mg Oral TID  . sodium zirconium cyclosilicate  10 g Oral Daily   . sodium chloride 500 mL/hr at 09/17/19 1605  .  ceFAZolin (ANCEF) IV    . piperacillin-tazobactam (ZOSYN)  IV 3.375 g (09/19/19 0742)   acetaminophen **OR** acetaminophen, albuterol, bismuth subsalicylate, clonazePAM, hydrALAZINE, hydrOXYzine, oxyCODONE-acetaminophen, pantoprazole    Lori C Foster, MD 09/19/2019 8:58 AM  Noted hcg was rechecked and normal at 2.   Lori C Foster 9:22 AM 09/19/2019  

## 2019-09-19 NOTE — Op Note (Signed)
09/19/2019  12:28 PM  PATIENT:  Felicia Reynolds    PRE-OPERATIVE DIAGNOSIS:  Left Foot Osteomyelitis  POST-OPERATIVE DIAGNOSIS:  Same  PROCEDURE:  LEFT FOOT 1ST RAY AMPUTATION Left foot second ray amputation Local tissue rearrangement for wound closure 10 x 5 cm. Application of Prevena wound VAC 13 cm.  SURGEON:  Newt Minion, MD  PHYSICIAN ASSISTANT:None ANESTHESIA:   General  PREOPERATIVE INDICATIONS:  Felicia Reynolds is a  39 y.o. female with a diagnosis of Left Foot Osteomyelitis who failed conservative measures and elected for surgical management.    The risks benefits and alternatives were discussed with the patient preoperatively including but not limited to the risks of infection, bleeding, nerve injury, cardiopulmonary complications, the need for revision surgery, among others, and the patient was willing to proceed.  OPERATIVE IMPLANTS: Praveena wound VAC with derma tack  @ENCIMAGES @  OPERATIVE FINDINGS: Tissue margins of the initial amputation would not healthy or viable there was necrotic tissue along the edges.  This necessitated completion of the first ray amputation as well as a second ray amputation and local tissue rearrangement for wound closure.  Arterial vessels were calcified.  OPERATIVE PROCEDURE: Patient was brought the operating room and underwent a general anesthetic.  After adequate levels anesthesia were obtained patient's left lower extremity was prepped using DuraPrep draped into a sterile field a timeout was called.  An elliptical incision was made around the wound edges that left a wound that was 10 x 5 cm.  The wound margins were healthy and viable.  The first ray amputation was completed at the base of the first metatarsal and a second ray amputation was performed to allow for wound closure with the large soft tissue defect.  The wound was irrigated with normal saline electrocautery was used for hemostasis.  Local tissue rearrangement was used to close  the wound that was 10 x 5 cm with 2-0 nylon.  A Prevena wound VAC was applied this was outlined with derma tack this had a good suction fit this was overwrapped with Covan patient was extubated taken the PACU in stable condition.   DISCHARGE PLANNING:  Antibiotic duration: Continue antibiotics for 24 hours  Weightbearing: Nonweightbearing on the left  Pain medication: Opioid pathway  Dressing care/ Wound VAC: Continue wound VAC for 1 week  Ambulatory devices: Walker  Discharge to: Discharge pending physical therapy recommendations  Follow-up: In the office 1 week post operative.

## 2019-09-19 NOTE — Progress Notes (Signed)
PROGRESS NOTE    Felicia Reynolds  HCW:[id number] DOB: 1980-10-23 DOA: 09/13/2019 PCP: [facility]     Brief Narrative:  Felicia Reynolds is a 39 y.o. female with medical history significant of IDDM with insulin resistance, diabetic neuropathy, hypertension, CKD stage III, chronic iron deficiency anemia secondary to menorrhagia, Suboxone therapy, presented with worsening of left toe/foot infection.  10 days ago, patient stepped on piece of mirror which cut open her left toe and distal left foot. She did not notice until few hours later because of the diabetic neuropathy.  2 days later she started to feel pain which gradually getting worse, she went to her PCP and received a few days of p.o. Augmentin.  For the last week, she developed worsening of pain, today pain is 10/10, constant, and swelling and purulent discharge with foul smell.  Also had subjective fever and chills at home.  Patient also reported she has separated with her spouse and has been living in the garage since last week.  Meanwhile she ran out of her insulin and Suboxone for about 7 days.  She has not been able to stand up and put weight on the left foot because of the severe pain, and she reported hard time to transfer herself out of bed. In the ED, patient was found to be tachycardia, blood pressure borderline low, fever of 103.  WBC 14.6, worsening of creatinine level from baseline 1.6 to now 2.59.  Left foot x-ray showed soft tissue air tracking along the fourth digit extending from first distal phalanx to proximal fourth metatarsal consistent with soft tissue infection concern for bony destruction of the first proximal phalanx and fracture of the first distal phalanx.  Orthopedic surgery was consulted.  Patient was started on IV Zosyn and vancomycin.  Due to continued worsening of her kidney function, nephrology was consulted.  Vancomycin was stopped.   -Status post excisional debridement, left foot first ray amputation by  Dr. Erlinda Hong 9/13.  -Back to the OR today, underwent completion left foot first ray amputation and second ray amputation 9/15  New events last 24 hours / Subjective:   Assessment & Plan:   Severe sepsis/osteomyelitis of the left great toe, present on admission -Presented with fever 103.2, tachycardia, leukocytosis, AKI -Foot x-ray revealed soft tissue air tracking along the first digit extending from the first distal phalanx to the proximal first metatarsal consistent with soft tissue infection and bony destruction along the distal aspect of the first proximal phalanx. -ABI 0.94 on the left -Blood cultures negative to date -Continue Zosyn -Status post excisional debridement, left foot first ray amputation by Dr. Erlinda Hong 9/13.   -Status post left foot second ray amputation, local tissue rearrangement and wound VAC placement in OR today -Follow-up wound cultures -PT eval  AKI on CKD stage IIIb -Baseline creatinine 1.7 -Likely from ATN in the setting of severe sepsis -Renal ultrasound showed decreased corticomedullary differentiation, no hydronephrosis -Nephrology following- -plan for tunneled HD catheter placement followed by hemodialysis 9/16 -BMP in a.m., monitor urine output closely, 900 mL last 24 hours  Acute on chronic iron deficiency anemia, chronic blood loss from menorrhagia, Intra-Op blood loss -Transfused total 3 unit packed red blood cell  -Ferrous sulfate. Avoid IV iron in acute infectious process -Patient admits to menorrhagia, no blood from stool reported. -Hemoglobin now stable, monitor  Uncontrolled diabetes with hyperglycemia and neuropathy -Hemoglobin A1c 9.0 -Continue Lantus, mealtime NovoLog, sliding scale insulin -Add meal coverage insulin  Abnormal liver on ultrasound -LFTs  normal, patient denies any history of alcohol abuse.  She denies any right upper quadrant abdominal pain -Alk phosphatase is elevated, GGT elevated  -Hepatitis panel negative -Will need further  outpatient work up once acute illness resolves  -Could have fatty liver disease as well  Morbid obesity Estimated body mass index is 49.88 kg/m as calculated from the following:   Height as of this encounter: 5' 6.5" (1.689 m).   Weight as of this encounter: 142.3 kg.   DVT prophylaxis: SCDs Start: 09/19/19 1429 Place and maintain sequential compression device Start: 09/15/19 0718  Code Status: Full code Family Communication: No family at bedside Disposition Plan:   Status is: Inpatient  Remains inpatient appropriate because:Ongoing diagnostic testing needed not appropriate for outpatient work up, IV treatments appropriate due to intensity of illness or inability to take PO and Inpatient level of care appropriate due to severity of illness   Dispo: The patient is from: Home              Anticipated d/c is to: Home              Anticipated d/c date is: > 3 days              Patient currently is not medically stable to d/c.  Osteomyelitis and severe renal failure   Consultants:   Orthopedic surgery  Nephrology  Procedures:  9/13:  PROCEDURE:  1.  Excisional debridement of left foot skin, subcutaneous tissue, muscle, tendon, bone 85 cm 2.  Left foot first ray amputation 3.  Application of wound VAC greater than 50 cm  9/15: First ray left foot amputation was completed and a second ray amputation followed by local tissue rearrangement and wound VAC application Dr. Sharol Given  Antimicrobials:  Anti-infectives (From admission, onward)   Start     Dose/Rate Route Frequency Ordered Stop   09/19/19 1000  ceFAZolin (ANCEF) 3 g in dextrose 5 % 50 mL IVPB  Status:  Discontinued        3 g 100 mL/hr over 30 Minutes Intravenous To Short Stay 09/19/19 0724 09/19/19 1416   09/18/19 0600  ceFAZolin (ANCEF) 3 g in dextrose 5 % 50 mL IVPB        3 g 100 mL/hr over 30 Minutes Intravenous On call to O.R. 09/17/19 1421 09/17/19 1528   09/17/19 1800  piperacillin-tazobactam (ZOSYN) IVPB  3.375 g        3.375 g 12.5 mL/hr over 240 Minutes Intravenous Every 12 hours 09/17/19 1206     09/14/19 1300  vancomycin (VANCOREADY) IVPB 1250 mg/250 mL  Status:  Discontinued        1,250 mg 166.7 mL/hr over 90 Minutes Intravenous Every 24 hours 09/13/19 1215 09/15/19 1130   09/13/19 2200  piperacillin-tazobactam (ZOSYN) IVPB 3.375 g  Status:  Discontinued        3.375 g 12.5 mL/hr over 240 Minutes Intravenous Every 8 hours 09/13/19 1215 09/13/19 1519   09/13/19 1730  piperacillin-tazobactam (ZOSYN) IVPB 3.375 g  Status:  Discontinued        3.375 g 12.5 mL/hr over 240 Minutes Intravenous Every 8 hours 09/13/19 1519 09/17/19 1206   09/13/19 1230  vancomycin (VANCOREADY) IVPB 2000 mg/400 mL        2,000 mg 200 mL/hr over 120 Minutes Intravenous  Once 09/13/19 1215 09/13/19 1635   09/13/19 1230  piperacillin-tazobactam (ZOSYN) IVPB 3.375 g        3.375 g 100 mL/hr over 30 Minutes Intravenous  Once 09/13/19 1215 09/13/19 1350   09/13/19 1215  vancomycin (VANCOCIN) IVPB 1000 mg/200 mL premix  Status:  Discontinued        1,000 mg 200 mL/hr over 60 Minutes Intravenous  Once 09/13/19 1208 09/13/19 1215       Objective: Vitals:   09/19/19 1310 09/19/19 1325 09/19/19 1340 09/19/19 1347  BP: (!) 141/66 140/66 138/66 136/66  Pulse: 72 70 70 72  Resp: 14 14 14 14   Temp:    98 F (36.7 C)  TempSrc:      SpO2: 94% 98% 99% 99%  Weight:      Height:        Intake/Output Summary (Last 24 hours) at 09/19/2019 1526 Last data filed at 09/19/2019 1347 Gross per 24 hour  Intake 840.23 ml  Output 1260 ml  Net -419.77 ml   Filed Weights   09/15/19 1130 09/16/19 0500 09/17/19 0500  Weight: (!) 138 kg (!) 141.4 kg (!) 142.3 kg    Examination: General exam: AAOx3 no distress HEENT: obese, unable to assess JVD Lungs: decreased BS at bases CVS: S1S2/RRR Abd: soft, Nt, BS present Ext" L foot w/ toe amputations, dressing and wound vac Psychiatry: flat affect  Data Reviewed: I have  personally reviewed following labs and imaging studies  CBC: Recent Labs  Lab 09/13/19 1217 09/13/19 1228 09/15/19 0332 09/15/19 0332 09/15/19 1830 09/16/19 0700 09/17/19 0448 09/18/19 0946 09/19/19 0607  WBC 14.6*   < > 6.4  --   --  7.6 8.5 16.4* 14.4*  NEUTROABS 12.8*  --   --   --   --   --   --   --   --   HGB 7.3*   < > 6.2*   < > 8.7* 8.2* 8.4* 9.6* 9.7*  HCT 23.9*   < > 20.6*   < > 27.6* 26.2* 26.5* 30.7* 30.1*  MCV 81.0   < > 81.7  --   --  81.6 82.3 81.6 80.7  PLT 191   < > 128*  --   --  156 186 268 303   < > = values in this interval not displayed.   Basic Metabolic Panel: Recent Labs  Lab 09/15/19 0332 09/15/19 0332 09/16/19 0700 09/16/19 0741 09/16/19 1441 09/17/19 0448 09/18/19 0946 09/18/19 1612 09/19/19 0607  NA 132*  --  134*  --   --  133* 133*  --  133*  K 4.6   < > 6.2*   < > 5.4* 5.2* 5.7* 5.7* 6.0*  CL 104  --  105  --   --  102 101  --  102  CO2 17*  --  18*  --   --  17* 16*  --  15*  GLUCOSE 159*  --  76  --   --  110* 148*  --  261*  BUN 38*  --  47*  --   --  51* 63*  --  74*  CREATININE 3.68*  --  4.51*  --   --  5.33* 5.95*  --  6.18*  CALCIUM 8.1*  --  8.5*  --   --  8.4* 8.4*  --  8.2*   < > = values in this interval not displayed.   GFR: Estimated Creatinine Clearance: 17.6 mL/min (A) (by C-G formula based on SCr of 6.18 mg/dL (H)). Liver Function Tests: Recent Labs  Lab 09/13/19 1217 09/13/19 1217 09/15/19 1830 09/16/19 9702 09/17/19 0448 09/18/19 6378 09/19/19 5885  AST 15  --   --  31 23 16 20   ALT 13  --   --  15 15 9 8   ALKPHOS 158*  --   --  488* 616* 599* 547*  BILITOT 0.4   < > 1.9* 2.1* 2.1* 2.0* 1.1  PROT 5.5*  --   --  6.2* 6.3* 5.9* 6.0*  ALBUMIN 1.7*  --   --  2.2* 1.9* 1.6* 1.7*   < > = values in this interval not displayed.   No results for input(s): LIPASE, AMYLASE in the last 168 hours. No results for input(s): AMMONIA in the last 168 hours. Coagulation Profile: Recent Labs  Lab 09/13/19 1217  09/15/19 1830  INR 1.5* 1.3*   Cardiac Enzymes: No results for input(s): CKTOTAL, CKMB, CKMBINDEX, TROPONINI in the last 168 hours. BNP (last 3 results) No results for input(s): PROBNP in the last 8760 hours. HbA1C: No results for input(s): HGBA1C in the last 72 hours. CBG: Recent Labs  Lab 09/18/19 1616 09/18/19 2202 09/19/19 0812 09/19/19 1030 09/19/19 1226  GLUCAP 185* 236* 226* 274* 232*   Lipid Profile: No results for input(s): CHOL, HDL, LDLCALC, TRIG, CHOLHDL, LDLDIRECT in the last 72 hours. Thyroid Function Tests: No results for input(s): TSH, T4TOTAL, FREET4, T3FREE, THYROIDAB in the last 72 hours. Anemia Panel: No results for input(s): VITAMINB12, FOLATE, FERRITIN, TIBC, IRON, RETICCTPCT in the last 72 hours. Sepsis Labs: Recent Labs  Lab 09/13/19 1204  LATICACIDVEN 1.7    Recent Results (from the past 240 hour(s))  Culture, blood (Routine x 2)     Status: None   Collection Time: 09/13/19 12:58 PM   Specimen: BLOOD  Result Value Ref Range Status   Specimen Description BLOOD SITE NOT SPECIFIED  Final   Special Requests   Final    BOTTLES DRAWN AEROBIC AND ANAEROBIC Blood Culture results may not be optimal due to an excessive volume of blood received in culture bottles   Culture   Final    NO GROWTH 5 DAYS Performed at [facility], [place] [address], [place], [place] [address]    Report Status 09/18/2019 FINAL  Final  Culture, blood (Routine x 2)     Status: None   Collection Time: 09/13/19  1:08 PM   Specimen: BLOOD  Result Value Ref Range Status   Specimen Description BLOOD SITE NOT SPECIFIED  Final   Special Requests   Final    BOTTLES DRAWN AEROBIC AND ANAEROBIC Blood Culture adequate volume   Culture   Final    NO GROWTH 5 DAYS Performed at [facility], [place] [address], [place], [place] [address]    Report Status 09/18/2019 FINAL  Final  SARS Coronavirus 2 by RT PCR (hospital order, performed in [facility] hospital lab) Nasopharyngeal  Nasopharyngeal Swab     Status: None   Collection Time: 09/13/19  2:02 PM   Specimen: Nasopharyngeal Swab  Result Value Ref Range Status   SARS Coronavirus 2 NEGATIVE NEGATIVE Final    Comment: (NOTE) SARS-CoV-2 target nucleic acids are NOT DETECTED.  The SARS-CoV-2 RNA is generally detectable in upper and lower respiratory specimens during the acute phase of infection. The lowest concentration of SARS-CoV-2 viral copies this assay can detect is 250 copies / mL. A negative result does not preclude SARS-CoV-2 infection and should not be used as the sole basis for treatment or other patient management decisions.  A negative result may occur with improper specimen collection / handling, submission  of specimen other than nasopharyngeal swab, presence of viral mutation(s) within the areas targeted by this assay, and inadequate number of viral copies (<250 copies / mL). A negative result must be combined with clinical observations, patient history, and epidemiological information.  Fact Sheet for Patients:   StrictlyIdeas.no  Fact Sheet for Healthcare Providers: BankingDealers.co.za  This test is not yet approved or  cleared by the [place] FDA and has been authorized for detection and/or diagnosis of SARS-CoV-2 by FDA under an Emergency Use Authorization (EUA).  This EUA will remain in effect (meaning this test can be used) for the duration of the COVID-19 declaration under Section 564(b)(1) of the Act, 21 U.S.C. section 360bbb-3(b)(1), unless the authorization is terminated or revoked sooner.  Performed at [facility], [place] [address] [place], [place] [address]   Urine culture     Status: Abnormal   Collection Time: 09/13/19  8:06 PM   Specimen: In/Out Cath Urine  Result Value Ref Range Status   Specimen Description IN/OUT CATH URINE  Final   Special Requests   Final    NONE Performed at [facility], [place]  [address] [place], [place] [address]    Culture MULTIPLE SPECIES PRESENT, SUGGEST RECOLLECTION (A)  Final   Report Status 09/14/2019 FINAL  Final  MRSA PCR Screening     Status: None   Collection Time: 09/15/19  4:55 AM   Specimen: Nasal Mucosa; Nasopharyngeal  Result Value Ref Range Status   MRSA by PCR NEGATIVE NEGATIVE Final    Comment:        The GeneXpert MRSA Assay (FDA approved for NASAL specimens only), is one component of a comprehensive MRSA colonization surveillance program. It is not intended to diagnose MRSA infection nor to guide or monitor treatment for MRSA infections. Performed at [facility], [place] [address] [place] [address] [place], [place] [address]   Aerobic/Anaerobic Culture (surgical/deep wound)     Status: None (Preliminary result)   Collection Time: 09/17/19  4:18 PM   Specimen: Soft Tissue, Other  Result Value Ref Range Status   Specimen Description TISSUE LEFT TOE  Final   Special Requests LEFT GREAT TOE  Final   Gram Stain   Final    MODERATE WBC PRESENT,BOTH PMN AND MONONUCLEAR FEW GRAM POSITIVE COCCI Performed at [facility], [place] [address], [place], [place] [address]    Culture   Final    RARE GRAM NEGATIVE RODS IDENTIFICATION AND SUSCEPTIBILITIES TO FOLLOW NO ANAEROBES ISOLATED; CULTURE IN PROGRESS FOR 5 DAYS    Report Status PENDING  Incomplete  Surgical PCR screen     Status: None   Collection Time: 09/17/19 10:21 PM   Specimen: Nasal Mucosa; Nasal Swab  Result Value Ref Range Status   MRSA, PCR NEGATIVE NEGATIVE Final   Staphylococcus aureus NEGATIVE NEGATIVE Final    Comment: (NOTE) The Xpert SA Assay (FDA approved for NASAL specimens in patients 9 years of age and older), is one component of a comprehensive surveillance program. It is not intended to diagnose infection nor to guide or monitor treatment. Performed at [facility], [place] [address] [place], [place] [address]       Radiology Studies: No results  found.    Scheduled Meds: . Chlorhexidine Gluconate Cloth  6 each Topical Daily  . Chlorhexidine Gluconate Cloth  6 each Topical Q0600  . cloNIDine  0.1 mg Oral BID  . docusate sodium  100 mg Oral BID  .  feeding supplement  1 Container Oral TID BM  . ferrous sulfate  325 mg Oral BID WC  . furosemide  80 mg Intravenous Once  . HYDROmorphone      . insulin aspart      . insulin aspart  0-15 Units Subcutaneous TID WC  . insulin aspart  0-5 Units Subcutaneous QHS  . insulin aspart  4 Units Subcutaneous TID WC  . insulin glargine  40 Units Subcutaneous QHS  . mupirocin ointment  1 application Nasal BID  . pregabalin  150 mg Oral QHS  . sodium bicarbonate  50 mEq Intravenous Once  . sodium bicarbonate  650 mg Oral TID  . sodium zirconium cyclosilicate  10 g Oral Daily   Continuous Infusions: . sodium chloride 500 mL/hr at 09/17/19 1605  . sodium chloride    . sodium chloride    . sodium chloride    . piperacillin-tazobactam (ZOSYN)  IV 3.375 g (09/19/19 0742)     LOS: 6 days   Time spent: 35 minutes   Domenic Polite, MD [facility] 09/19/2019, 3:26 PM

## 2019-09-19 NOTE — TOC Progression Note (Signed)
Transition of Care [facility]) - Progression Note    Patient Details  Name: Felicia Reynolds MRN: [id number] Date of Birth: 11/02/1980  Transition of Care [facility]) CM/SW [place], [place] Phone Number: 09/19/2019, 5:36 PM  Clinical Narrative:     Unable to visit with patient due to patient care was in process.  Thurmond Butts, MSW, [place] Clinical Social Worker   Expected Discharge Plan:  (Crisis shelter) Barriers to Discharge: Continued Medical Work up  Expected Discharge Plan and Services Expected Discharge Plan:  (Crisis shelter)                                               Social Determinants of Health (SDOH) Interventions    Readmission Risk Interventions No flowsheet data found.

## 2019-09-19 NOTE — Anesthesia Postprocedure Evaluation (Signed)
Anesthesia Post Note  Patient: Felicia Reynolds  Procedure(s) Performed: LEFT FOOT 1ST RAY AMPUTATION (Left )     Patient location during evaluation: PACU Anesthesia Type: General Level of consciousness: awake and alert, patient cooperative and oriented Pain management: pain level controlled (pt remains uncomfortable, but improving) Vital Signs Assessment: post-procedure vital signs reviewed and stable Respiratory status: spontaneous breathing, nonlabored ventilation, respiratory function stable and patient connected to nasal cannula oxygen Cardiovascular status: blood pressure returned to baseline and stable Postop Assessment: no apparent nausea or vomiting Anesthetic complications: no   No complications documented.  Last Vitals:  Vitals:   09/19/19 1340 09/19/19 1347  BP: 138/66 136/66  Pulse: 70 72  Resp: 14 14  Temp:  36.7 C  SpO2: 99% 99%    Last Pain:  Vitals:   09/19/19 1330  TempSrc:   PainSc: 9                  Cree Napoli,E. Marillyn Goren

## 2019-09-19 NOTE — Progress Notes (Signed)
Interventional radiology will not be able to place dialysis cath today.  Dr. Royce Macadamia notified.

## 2019-09-20 ENCOUNTER — Inpatient Hospital Stay (HOSPITAL_COMMUNITY): Payer: Medicaid Other

## 2019-09-20 ENCOUNTER — Encounter (HOSPITAL_COMMUNITY): Payer: Self-pay | Admitting: Orthopedic Surgery

## 2019-09-20 HISTORY — PX: IR US GUIDE VASC ACCESS RIGHT: IMG2390

## 2019-09-20 HISTORY — PX: IR FLUORO GUIDE CV LINE RIGHT: IMG2283

## 2019-09-20 LAB — COMPREHENSIVE METABOLIC PANEL
ALT: 7 U/L (ref 0–44)
AST: 20 U/L (ref 15–41)
Albumin: 1.6 g/dL — ABNORMAL LOW (ref 3.5–5.0)
Alkaline Phosphatase: 471 U/L — ABNORMAL HIGH (ref 38–126)
Anion gap: 16 — ABNORMAL HIGH (ref 5–15)
BUN: 81 mg/dL — ABNORMAL HIGH (ref 6–20)
CO2: 17 mmol/L — ABNORMAL LOW (ref 22–32)
Calcium: 8.1 mg/dL — ABNORMAL LOW (ref 8.9–10.3)
Chloride: 104 mmol/L (ref 98–111)
Creatinine, Ser: 6.45 mg/dL — ABNORMAL HIGH (ref 0.44–1.00)
GFR calc Af Amer: 9 mL/min — ABNORMAL LOW (ref >60)
GFR calc non Af Amer: 7 mL/min — ABNORMAL LOW (ref >60)
Glucose, Bld: 117 mg/dL — ABNORMAL HIGH (ref 70–99)
Potassium: 5.9 mmol/L — ABNORMAL HIGH (ref 3.5–5.1)
Sodium: 137 mmol/L (ref 135–145)
Total Bilirubin: 1 mg/dL (ref 0.3–1.2)
Total Protein: 5.6 g/dL — ABNORMAL LOW (ref 6.5–8.1)

## 2019-09-20 LAB — CBC
HCT: 25 % — ABNORMAL LOW (ref 36.0–46.0)
Hemoglobin: 8 g/dL — ABNORMAL LOW (ref 12.0–15.0)
MCH: 26 pg (ref 26.0–34.0)
MCHC: 32 g/dL (ref 30.0–36.0)
MCV: 81.2 fL (ref 80.0–100.0)
Platelets: 259 10*3/uL (ref 150–400)
RBC: 3.08 MIL/uL — ABNORMAL LOW (ref 3.87–5.11)
RDW: 20.6 % — ABNORMAL HIGH (ref 11.5–15.5)
WBC: 10.3 10*3/uL (ref 4.0–10.5)
nRBC: 0 % (ref 0.0–0.2)

## 2019-09-20 LAB — GLUCOSE, CAPILLARY
Glucose-Capillary: 126 mg/dL — ABNORMAL HIGH (ref 70–99)
Glucose-Capillary: 93 mg/dL (ref 70–99)
Glucose-Capillary: 78 mg/dL (ref 70–99)
Glucose-Capillary: 95 mg/dL (ref 70–99)

## 2019-09-20 LAB — HEPATITIS B CORE ANTIBODY, TOTAL: Hep B Core Total Ab: NONREACTIVE

## 2019-09-20 LAB — HEPATITIS B SURFACE ANTIBODY,QUALITATIVE: Hep B S Ab: NONREACTIVE

## 2019-09-20 LAB — HEPATITIS B SURFACE ANTIGEN: Hepatitis B Surface Ag: NONREACTIVE

## 2019-09-20 MED ORDER — FENTANYL CITRATE (PF) 100 MCG/2ML IJ SOLN
INTRAMUSCULAR | Status: AC
  Filled 2019-09-20: qty 2

## 2019-09-20 MED ORDER — GELATIN ABSORBABLE 12-7 MM EX MISC
CUTANEOUS | Status: AC
  Filled 2019-09-20: qty 1

## 2019-09-20 MED ORDER — CEFAZOLIN SODIUM-DEXTROSE 2-4 GM/100ML-% IV SOLN
INTRAVENOUS | Status: AC
  Filled 2019-09-20: qty 100

## 2019-09-20 MED ORDER — HEPARIN SODIUM (PORCINE) 1000 UNIT/ML IJ SOLN
INTRAMUSCULAR | Status: AC
  Filled 2019-09-20: qty 1

## 2019-09-20 MED ORDER — CEPHALEXIN 500 MG PO CAPS
500.0000 mg | ORAL_CAPSULE | Freq: Two times a day (BID) | ORAL | Status: DC
  Administered 2019-09-21: 500 mg via ORAL
  Filled 2019-09-20 (×2): qty 1

## 2019-09-20 MED ORDER — LIDOCAINE HCL (PF) 1 % IJ SOLN
INTRAMUSCULAR | Status: AC | PRN
  Administered 2019-09-20: 15 mL

## 2019-09-20 MED ORDER — CEFAZOLIN SODIUM-DEXTROSE 2-4 GM/100ML-% IV SOLN
2.0000 g | Freq: Once | INTRAVENOUS | Status: AC
  Administered 2019-09-20: 2 g via INTRAVENOUS

## 2019-09-20 MED ORDER — MIDAZOLAM HCL 2 MG/2ML IJ SOLN
INTRAMUSCULAR | Status: AC
  Filled 2019-09-20: qty 2

## 2019-09-20 MED ORDER — CHLORHEXIDINE GLUCONATE 4 % EX LIQD
CUTANEOUS | Status: AC
  Filled 2019-09-20: qty 15

## 2019-09-20 MED ORDER — LIDOCAINE HCL 1 % IJ SOLN
INTRAMUSCULAR | Status: AC
  Filled 2019-09-20: qty 20

## 2019-09-20 MED ORDER — OXYCODONE-ACETAMINOPHEN 5-325 MG PO TABS
2.0000 | ORAL_TABLET | Freq: Three times a day (TID) | ORAL | Status: DC | PRN
  Administered 2019-09-20 – 2019-09-24 (×6): 2 via ORAL
  Filled 2019-09-20 (×6): qty 2

## 2019-09-20 MED ORDER — HEPARIN SODIUM (PORCINE) 1000 UNIT/ML IJ SOLN
INTRAMUSCULAR | Status: AC
  Administered 2019-09-20: 1000 [IU]
  Filled 2019-09-20: qty 4

## 2019-09-20 MED ORDER — MIDAZOLAM HCL 2 MG/2ML IJ SOLN
INTRAMUSCULAR | Status: AC | PRN
  Administered 2019-09-20: 1 mg via INTRAVENOUS

## 2019-09-20 MED ORDER — FENTANYL CITRATE (PF) 100 MCG/2ML IJ SOLN
INTRAMUSCULAR | Status: AC | PRN
  Administered 2019-09-20: 50 ug via INTRAVENOUS

## 2019-09-20 MED ORDER — HYDROXYZINE HCL 10 MG PO TABS
10.0000 mg | ORAL_TABLET | Freq: Two times a day (BID) | ORAL | Status: DC | PRN
  Administered 2019-09-20: 10 mg via ORAL
  Filled 2019-09-20 (×2): qty 1

## 2019-09-20 NOTE — Progress Notes (Signed)
Inpatient Rehab Admissions Coordinator Note:   Per therapy recommendations, pt was screened for CIR candidacy by Shann Medal, PT, DPT.  At this time note mobility eval limited by pain/fatigue.  Also note daily assessment for HD needs.  We will not place a consult order at this time, but follow for 1-2 more therapy sessions to assess appropriateness. Pt would benefit from an OT consult to assess ability to complete ADLs.  Please contact me with questions.   Shann Medal, PT, DPT [phone] 09/20/19 12:34 PM

## 2019-09-20 NOTE — Progress Notes (Signed)
Patient returned from dialysis no distress noted at this time. Patient was encouraged to call and order dinner.

## 2019-09-20 NOTE — H&P (Signed)
Chief Complaint: Acute on Chronic Renal failure  Referring Physician(s): Domenic Polite, MD  Supervising Physician: Corrie Mckusick  Patient Status: [facility] - In-pt  History of Present Illness: Felicia Reynolds is a 39 y.o. female with medical history significant ofIDDM withinsulin resistance, diabetic neuropathy, hypertension, CKD stage III, chronic iron deficiency anemia secondary to menorrhagia, and on Suboxone therapy.  She presented to the ED with 10/10 pain and worsening of left toe/foot infection after stepping onpiece of mirror. This happened about 11 days ago.  She initially didn't notice because of the diabetic neuropathy.   On presentation she had a foul smelling drainage and fever of 103.  She has known CKD stage 3, likely secondary to diabetic nephropathy.  Her creatinine worsened due to her infection and is now up to 6.18.  She needs a tunneled dialysis catheter placed today.  She is NPO.   Past Medical History:  Diagnosis Date  . Anxiety   . Arthritis   . Bipolar disorder ([place])   . Diabetes ([place]) 06/05/2015  . Diabetes mellitus   . Fibromyalgia   . History of abnormal cervical Pap smear 05/29/2015  . History of diabetes mellitus, type II 05/29/2015  . Hypertension   . Neuropathy   . Obesity 05/29/2015  . Osteoporosis   . Ovarian cyst 02/20/2016  . Ovarian cyst 02/20/2016   Simple cyst rt ovary  . Panic attacks   . Pneumonia   . Stroke ([place])   . Superficial fungus infection of skin 05/29/2015  . Vitamin D deficiency 06/05/2015    Past Surgical History:  Procedure Laterality Date  . AMPUTATION Left 09/17/2019   Procedure: AMPUTATION LEFT GREAT TOE/ RAY WITH WOUND VAC APPLICATION;  Surgeon: Leandrew Koyanagi, MD;  Location: [place];  Service: Orthopedics;  Laterality: Left;  . AMPUTATION Left 09/19/2019   Procedure: LEFT FOOT 1ST RAY AMPUTATION;  Surgeon: Newt Minion, MD;  Location: [place];  Service: Orthopedics;  Laterality: Left;  . CESAREAN SECTION    .  HERNIA REPAIR    . THROAT SURGERY      Allergies: Hydrocodone and Latex  Medications: Prior to Admission medications   Medication Sig Start Date End Date Taking? Authorizing Provider  bismuth subsalicylate (PEPTO BISMOL) 262 MG/15ML suspension Take 30 mLs by mouth daily as needed for indigestion.   Yes [provider]  clonazePAM (KLONOPIN) 0.5 MG tablet Take 0.5 mg by mouth 2 (two) times daily as needed for anxiety. 08/13/19  Yes [provider]  cloNIDine (CATAPRES) 0.2 MG tablet Take 0.2 mg by mouth 2 (two) times daily.   Yes [provider]  diclofenac sodium (VOLTAREN) 1 % GEL Apply 2 g topically 4 (four) times daily as needed (pain).    Yes [provider]  furosemide (LASIX) 20 MG tablet Take 20 mg by mouth 2 (two) times daily as needed for edema. 04/21/19  Yes [provider]  hydrOXYzine (ATARAX/VISTARIL) 50 MG tablet Take 50 mg by mouth 2 (two) times daily as needed for anxiety. 06/09/19  Yes [provider]  insulin aspart (NOVOLOG) 100 UNIT/ML injection Inject 30 Units into the skin 3 (three) times daily with meals. Patient taking differently: Inject 2-30 Units into the skin 3 (three) times daily with meals. Per sliding scale: not provided 09/15/18  Yes Eure, Mertie Clause, MD  Insulin Glargine (LANTUS SOLOSTAR) 100 UNIT/ML Solostar Pen Inject 80 Units into the skin at bedtime. Patient taking differently: Inject 80 Units into the skin 2 (  two) times daily. Inject -80 units under the skin twice daily. 09/15/18  Yes Florian Buff, MD  lisinopril-hydrochlorothiazide (ZESTORETIC) 20-12.5 MG tablet Take 1 tablet by mouth daily. 04/21/19  Yes [provider]  pregabalin (LYRICA) 200 MG capsule Take 200 mg by mouth 3 (three) times daily.    Yes [provider]  PROAIR HFA 108 (90 Base) MCG/ACT inhaler Inhale 1 puff into the lungs 4 (four) times daily as needed for wheezing or shortness of breath. 08/15/19  Yes [provider]  silver sulfADIAZINE (SILVADENE) 1 % cream APPLY TO AFFECTED AREA 2 OR 3 TIMES DAILY Patient taking differently: Apply 1 application topically 3 (three) times daily. APPLY TO AFFECTED AREA 2 OR 3 TIMES DAILY 03/30/19  Yes Florian Buff, MD  azithromycin (ZITHROMAX) 500 MG tablet Take 1 tablet (500 mg total) by mouth daily. Patient not taking: Reported on 06/11/2019 11/25/18   Orson Eva, MD  cefdinir (OMNICEF) 300 MG capsule Take 1 capsule (300 mg total) by mouth 2 (two) times daily. Patient not taking: Reported on 09/13/2019 11/25/18   Orson Eva, MD  megestrol (MEGACE) 40 MG tablet Take 1 tablet (40 mg total) by mouth 3 (three) times daily as needed (regulate period). Patient not taking: Reported on 09/13/2019 12/05/18   Florian Buff, MD  NOVOLOG FLEXPEN 100 UNIT/ML FlexPen INJECT 30 UNITS SUBCUTANEOUSLY 3 TIMES A DAY WITH MEALS Patient not taking: Reported on 09/13/2019 09/07/19   Florian Buff, MD  rosuvastatin (CRESTOR) 20 MG tablet Take 20 mg by mouth at bedtime. 06/21/19   [provider]  SUBOXONE 8-2 MG FILM Place 2 Film under the tongue daily.  Patient not taking: Reported on 09/13/2019 11/17/18   [provider]  tiZANidine (ZANAFLEX) 4 MG capsule Take 4 mg by mouth 3 (three) times daily. Patient not taking: Reported on 09/13/2019    [provider]     Family History  Problem Relation Age of Onset  . Cancer Mother 48       breast  . Breast cancer Mother   . Breast cancer Maternal Aunt   . Breast cancer Maternal Grandmother   . Cancer Maternal Aunt     Social History   Socioeconomic History  . Marital status: Married    Spouse name: Not on file  . Number of children: Not on file  . Years of education: Not on file  . Highest education level: Not on file  Occupational History  . Not on file  Tobacco Use  . Smoking status: Current Some Day Smoker    Packs/day: 0.50    Years: 13.00    Pack years: 6.50    Types: Cigarettes  . Smokeless tobacco: Never  Used  Vaping Use  . Vaping Use: Never used  Substance and Sexual Activity  . Alcohol use: No    Alcohol/week: 0.0 standard drinks  . Drug use: No  . Sexual activity: Yes    Birth control/protection: None  Other Topics Concern  . Not on file  Social History Narrative   Has been in contact with social services for assistance with housing and medications,    Social Determinants of Health   Financial Resource Strain: Medium Risk  . Difficulty of Paying Living Expenses: Somewhat hard  Food Insecurity: Food Insecurity Present  . Worried About Charity fundraiser in the Last Year: Sometimes true  . Ran Out of Food in the Last Year: Sometimes true  Transportation Needs: Unmet Transportation Needs  .  Lack of Transportation (Medical): Yes  . Lack of Transportation (Non-Medical): Yes  Physical Activity: Inactive  . Days of Exercise per Week: 0 days  . Minutes of Exercise per Session: 0 min  Stress: Stress Concern Present  . Feeling of Stress : Rather much  Social Connections: Moderately Isolated  . Frequency of Communication with Friends and Family: More than three times a week  . Frequency of Social Gatherings with Friends and Family: More than three times a week  . Attends Religious Services: Never  . Active Member of Clubs or Organizations: No  . Attends Archivist Meetings: Never  . Marital Status: Married     Review of Systems: A 12 point ROS discussed and pertinent positives are indicated in the HPI above.  All other systems are negative.  Review of Systems  Vital Signs: BP (!) 143/69 (BP Location: Left Arm)   Pulse 69   Temp 98 F (36.7 C) (Oral)   Resp 14   Ht 5' 6.5" (1.689 m)   Wt (!) 142.3 kg   SpO2 97%   BMI 49.88 kg/m   Physical Exam Vitals reviewed.  Constitutional:      Appearance: She is obese.  HENT:     Head: Normocephalic and atraumatic.  Eyes:     Extraocular Movements: Extraocular movements intact.  Cardiovascular:     Rate and  Rhythm: Normal rate and regular rhythm.  Pulmonary:     Effort: Pulmonary effort is normal. No respiratory distress.     Breath sounds: Normal breath sounds.  Abdominal:     General: There is no distension.     Palpations: Abdomen is soft.     Tenderness: There is no abdominal tenderness.  Musculoskeletal:        General: Normal range of motion.     Comments: S/P toe ampuation  Skin:    General: Skin is warm and dry.  Neurological:     General: No focal deficit present.     Mental Status: She is alert and oriented to person, place, and time.  Psychiatric:        Mood and Affect: Mood normal.        Behavior: Behavior normal.        Thought Content: Thought content normal.        Judgment: Judgment normal.     Imaging: DG Chest 2 View  Result Date: 09/13/2019 CLINICAL DATA:  Sepsis EXAM: CHEST - 2 VIEW COMPARISON:  November 24, 2018 FINDINGS: Lungs are clear. Heart is borderline enlarged with pulmonary vascularity normal. No adenopathy. No bone lesions. IMPRESSION: Lungs clear.  Heart borderline enlarged.  No adenopathy. Electronically Signed   By: Lowella Grip III M.D.   On: 09/13/2019 13:54   US RENAL  Result Date: 09/15/2019 CLINICAL DATA:  Acute kidney injury EXAM: RENAL / URINARY TRACT ULTRASOUND COMPLETE COMPARISON:  CT of November 24, 2018 FINDINGS: Right Kidney: Renal measurements: 12.2 x 5.4 x 7.0 cm = volume: 241 mL. No hydronephrosis. Decreased corticomedullary differentiation though assessment is limited by body habitus. Left Kidney: Renal measurements: 12.0 x 6.8 x 6.6 cm = volume: 279 mL. No hydronephrosis. Decreased corticomedullary differentiation suggested again body habitus limiting assessment. Bladder: Urinary bladder decompressed. The subtle findings of echogenicity in the area suggest correlation with Foley catheter which is in place. Other: Signs of disease with nodular hepatic contours and fissural widening. Marked gallbladder wall thickening, seen without  signs of distension. No pericholecystic fluid. IMPRESSION: 1. Decreased corticomedullary differentiation,  no hydronephrosis. 2. Signs of liver disease with nodular hepatic contour. Correlate with any clinical or laboratory evidence of liver disease. 3. Marked gallbladder wall thickening without distension, a finding that can be seen in the setting of hepatic or cardiac dysfunction. This finding can also be seen in acute hepatitis. Correlate with any RIGHT upper quadrant pain. HIDA scan could be helpful if there is clinical concern for acute gallbladder pathology. Electronically Signed   By: Zetta Bills M.D.   On: 09/15/2019 18:10   DG Foot Complete Left  Result Date: 09/13/2019 CLINICAL DATA:  Soft tissue infection EXAM: LEFT FOOT - COMPLETE 3+ VIEW COMPARISON:  September 05, 2019 FINDINGS: Frontal, oblique, and lateral views were obtained. There is extensive soft tissue air tracking through the first digit from the proximal first metatarsal to the distal aspect of the first digit. There is a focal area of apparent rarefaction along the distal aspect of the first distal phalanx along the medial and lateral compartments. There may be a degree of bony destruction in this region. No similar changes suggesting bony destruction elsewhere. There is a fracture along the lateral aspect of the proximal portion of the first distal phalanx. No other fracture evident. No dislocation. No joint space narrowing. IMPRESSION: 1. Soft tissue air tracking along the first digit extending from the first distal phalanx to the proximal first metatarsal consistent with soft tissue infection. 2. Concern for bony destruction along the distal aspect of the first proximal phalanx. 3. Fracture along the lateral aspect of the proximal portion of the first distal phalanx. Electronically Signed   By: Lowella Grip III M.D.   On: 09/13/2019 13:53   DG Foot Complete Left  Result Date: 09/05/2019 CLINICAL DATA:  89 year old who stepped on  a piece of glass approximately 2 weeks ago and now has a wound on the great toe. Initial imaging encounter. EXAM: LEFT FOOT - COMPLETE 3+ VIEW COMPARISON:  None. FINDINGS: Soft tissue laceration laterally involving the great toe. Marked DORSAL soft tissue swelling and marked swelling involving the great toe. No evidence of acute or subacute fracture. No visible opaque foreign body. Well-preserved bone mineral density. Well preserved joint spaces. Large plantar calcaneal spur. IMPRESSION: 1. No acute or subacute osseous abnormality. No visible opaque foreign body. 2. Large plantar calcaneal spur. 3. Soft tissue laceration laterally involving the great toe. Marked DORSAL soft tissue swelling and marked swelling involving the great toe. Electronically Signed   By: Evangeline Dakin M.D.   On: 09/05/2019 18:30   VAS [place] ABI WITH/WO TBI  Result Date: 09/13/2019 LOWER EXTREMITY DOPPLER STUDY Indications: Ulceration. High Risk Factors: Diabetes.  Limitations: Today's exam was limited due to an open wound. Comparison Study: No prior studies. Performing Technologist: Carlos Levering RVT  Examination Guidelines: A complete evaluation includes at minimum, Doppler waveform signals and systolic blood pressure reading at the level of bilateral brachial, anterior tibial, and posterior tibial arteries, when vessel segments are accessible. Bilateral testing is considered an integral part of a complete examination. Photoelectric Plethysmograph (PPG) waveforms and toe systolic pressure readings are included as required and additional duplex testing as needed. Limited examinations for reoccurring indications may be performed as noted.  ABI Findings: +---------+------------------+-----+---------+--------+ Right    Rt Pressure (mmHg)IndexWaveform Comment  +---------+------------------+-----+---------+--------+ Brachial 100                    triphasic         +---------+------------------+-----+---------+--------+ PTA       95  0.95 triphasic         +---------+------------------+-----+---------+--------+ DP       89                0.89 triphasic         +---------+------------------+-----+---------+--------+ Great Toe79                0.79                   +---------+------------------+-----+---------+--------+ +--------+------------------+-----+----------+-------+ Left    Lt Pressure (mmHg)IndexWaveform  Comment +--------+------------------+-----+----------+-------+ Brachial                                 IV      +--------+------------------+-----+----------+-------+ PTA     94                0.94 biphasic          +--------+------------------+-----+----------+-------+ DP      73                0.73 monophasic        +--------+------------------+-----+----------+-------+ +-------+-----------+-----------+------------+------------+ ABI/TBIToday's ABIToday's TBIPrevious ABIPrevious TBI +-------+-----------+-----------+------------+------------+ Right  0.95       0.79                                +-------+-----------+-----------+------------+------------+ Left   0.94                                           +-------+-----------+-----------+------------+------------+  Summary: Right: Resting right ankle-brachial index is within normal range. No evidence of significant right lower extremity arterial disease. The right toe-brachial index is normal. Left: Resting left ankle-brachial index indicates mild left lower extremity arterial disease. Unable to obtain TBI due to great toe ulceration.  *See table(s) above for measurements and observations.  Electronically signed by Servando Snare MD on 09/13/2019 at 4:47:23 PM.   Final    ECHOCARDIOGRAM COMPLETE  Result Date: 09/16/2019    ECHOCARDIOGRAM REPORT   Patient Name:   Felicia Reynolds Date of Exam: 09/16/2019 Medical Rec #:  [id number]        Height:       66.5 in Accession #:    [id number]       Weight:       311.7  lb Date of Birth:  08/20/1980         BSA:          2.429 m Patient Age:    30 years         BP:           117/59 mmHg Patient Gender: F                HR:           71 bpm. Exam Location:  Inpatient Procedure: 2D Echo Indications:    acute renal failure  History:        Patient has prior history of Echocardiogram examinations, most                 recent 01/29/2018. Chronic kidney disease. Sepsis.; Risk                 Factors:Diabetes and Hypertension.  Sonographer:    Johny Chess Referring Phys:  [id number] [place]  1. Left ventricular ejection fraction, by estimation, is 45 to 50%. The left ventricle has mildly decreased function. The left ventricle demonstrates global hypokinesis. The left ventricular internal cavity size was mildly dilated. Left ventricular diastolic parameters were normal.  2. Right ventricular systolic function is normal. The right ventricular size is normal.  3. Left atrial size was moderately dilated.  4. The mitral valve is normal in structure. Trivial mitral valve regurgitation. No evidence of mitral stenosis.  5. The aortic valve is normal in structure. Aortic valve regurgitation is not visualized. No aortic stenosis is present.  6. The inferior vena cava is dilated in size with >50% respiratory variability, suggesting right atrial pressure of 8 mmHg. FINDINGS  Left Ventricle: Left ventricular ejection fraction, by estimation, is 45 to 50%. The left ventricle has mildly decreased function. The left ventricle demonstrates global hypokinesis. The left ventricular internal cavity size was mildly dilated. There is  no left ventricular hypertrophy. Left ventricular diastolic parameters were normal. Right Ventricle: The right ventricular size is normal. No increase in right ventricular wall thickness. Right ventricular systolic function is normal. Left Atrium: Left atrial size was moderately dilated. Right Atrium: Right atrial size was normal in size. Pericardium: There is no  evidence of pericardial effusion. Mitral Valve: The mitral valve is normal in structure. There is mild thickening of the mitral valve leaflet(s). Trivial mitral valve regurgitation. No evidence of mitral valve stenosis. Tricuspid Valve: The tricuspid valve is normal in structure. Tricuspid valve regurgitation is trivial. No evidence of tricuspid stenosis. Aortic Valve: The aortic valve is normal in structure. Aortic valve regurgitation is not visualized. No aortic stenosis is present. Pulmonic Valve: The pulmonic valve was normal in structure. Pulmonic valve regurgitation is not visualized. No evidence of pulmonic stenosis. Aorta: The aortic root is normal in size and structure. Venous: The inferior vena cava is dilated in size with greater than 50% respiratory variability, suggesting right atrial pressure of 8 mmHg. IAS/Shunts: No atrial level shunt detected by color flow Doppler.  LEFT VENTRICLE PLAX 2D LVIDd:         6.00 cm      Diastology LVIDs:         4.60 cm      LV e' medial:    7.07 cm/s LV PW:         1.30 cm      LV E/e' medial:  12.8 LV IVS:        1.10 cm      LV e' lateral:   7.83 cm/s                             LV E/e' lateral: 11.5  LV Volumes (MOD) LV vol d, MOD A4C: 131.0 ml LV vol s, MOD A4C: 89.6 ml LV SV MOD A4C:     131.0 ml RIGHT VENTRICLE             IVC RV S prime:     15.10 cm/s  IVC diam: 2.70 cm TAPSE (M-mode): 2.3 cm LEFT ATRIUM             Index       RIGHT ATRIUM           Index LA diam:        4.40 cm 1.81 cm/m  RA Area:     10.90 cm LA Vol (A2C):   62.3 ml 25.65 ml/m RA  Volume:   22.80 ml  9.39 ml/m LA Vol (A4C):   92.4 ml 38.05 ml/m LA Biplane Vol: 82.8 ml 34.10 ml/m  AORTIC VALVE LVOT Vmax:   102.00 cm/s LVOT Vmean:  67.200 cm/s LVOT VTI:    0.213 m  AORTA Ao Asc diam: 3.20 cm MITRAL VALVE MV Area (PHT): 3.72 cm    SHUNTS MV Decel Time: 204 msec    Systemic VTI: 0.21 m MV E velocity: 90.40 cm/s MV A velocity: 77.10 cm/s MV E/A ratio:  1.17 Jenkins Rouge MD Electronically  signed by Jenkins Rouge MD Signature Date/Time: 09/16/2019/3:49:03 PM    Final     Labs:  CBC: Recent Labs    09/16/19 0700 09/17/19 0448 09/18/19 0946 09/19/19 0607  WBC 7.6 8.5 16.4* 14.4*  HGB 8.2* 8.4* 9.6* 9.7*  HCT 26.2* 26.5* 30.7* 30.1*  PLT 156 186 268 303    COAGS: Recent Labs    09/13/19 1217 09/15/19 1830  INR 1.5* 1.3*  APTT 30  --     BMP: Recent Labs    09/16/19 0700 09/16/19 0741 09/17/19 0448 09/17/19 0448 09/18/19 0946 09/18/19 1612 09/19/19 0607 09/19/19 1541  NA 134*  --  133*  --  133*  --  133*  --   K 6.2*   < > 5.2*   < > 5.7* 5.7* 6.0* 5.5*  CL 105  --  102  --  101  --  102  --   CO2 18*  --  17*  --  16*  --  15*  --   GLUCOSE 76  --  110*  --  148*  --  261*  --   BUN 47*  --  51*  --  63*  --  74*  --   CALCIUM 8.5*  --  8.4*  --  8.4*  --  8.2*  --   CREATININE 4.51*  --  5.33*  --  5.95*  --  6.18*  --   GFRNONAA 11*  --  9*  --  8*  --  8*  --   GFRAA 13*  --  11*  --  9*  --  9*  --    < > = values in this interval not displayed.    LIVER FUNCTION TESTS: Recent Labs    09/16/19 0741 09/17/19 0448 09/18/19 0946 09/19/19 0607  BILITOT 2.1* 2.1* 2.0* 1.1  AST 31 23 16 20   ALT 15 15 9 8   ALKPHOS 488* 616* 599* 547*  PROT 6.2* 6.3* 5.9* 6.0*  ALBUMIN 2.2* 1.9* 1.6* 1.7*    TUMOR MARKERS: No results for input(s): AFPTM, CEA, CA199, CHROMGRNA in the last 8760 hours.  Assessment and Plan:  Acute on Chronic kidney disease, likely secondary to acute infection.  Will proceed with placement of a tunneled HD catheter today by Dr. Earleen Newport.  Risks and benefits discussed with the patient including, but not limited to bleeding, infection, vascular injury, pneumothorax which may require chest tube placement, air embolism or even death  All of the patient's questions were answered, patient is agreeable to proceed. Consent signed and in chart.  Thank you for this interesting consult.  I greatly enjoyed meeting Felicia Reynolds  and look forward to participating in their care.  A copy of this report was sent to the requesting provider on this date.  Electronically Signed: Murrell Redden, PA-C   09/20/2019, 9:00 AM      I spent a total of 20  Minutes  in face to face in clinical consultation, greater than 50% of which was counseling/coordinating care for tunneled HD cath.

## 2019-09-20 NOTE — Procedures (Signed)
Interventional Radiology Procedure Note  Procedure: Placement of a right IJ approach tunneled HD catheter, 19cm tip to cuff.  Tip is positioned at the superior cavoatrial junction and catheter is ready for immediate use.  Complications: None Recommendations:  - Ok to use - Do not submerge - Routine line care   Signed,  Dulcy Fanny. Earleen Newport, DO

## 2019-09-20 NOTE — Progress Notes (Signed)
Postop day 1 status post ray amputation.  She is sleeping but easily arousable.  No complaints.  Dressing clean dry intact wound VAC in place with approximately 40 cc of bloody drainage  Emphasized the importance of minimally weightbearing just on her heel only as needed.  Wound VAC to be in place for 1 week.  Will need orthopedic follow-up in our office in 1 week

## 2019-09-20 NOTE — Progress Notes (Signed)
PROGRESS NOTE    Felicia Reynolds  QZE:[id number] DOB: 09-May-1980 DOA: 09/13/2019 PCP: [facility]     Brief Narrative:  Felicia Reynolds is a 39/F w IDDM, diabetic neuropathy, hypertension, CKD stage IIIb, chronic iron deficiency anemia, Suboxone therapy, presented with worsening of left toe/foot infection.  10 days ago, patient stepped on piece of mirror which cut open her left toe and distal left foot was seen by her PCP who prescribed Augmentin, subsequently developed worsening pain and swelling with purulent foul-smelling discharge, associated fevers and chills. - In the ED, she was hypotensive, tachycardic febrile to 103, worsening of creatinine level to 2.6. -  Left foot x-ray showed soft tissue air tracking, soft tissue destruction and osteomyelitis,  orthopedic surgery was consulted.  Patient was started on IV Zosyn and vancomycin.  -Her kidney function continued to gradually worsen nephrology was consulted.  Vancomycin was stopped.   -Status post excisional debridement, left foot first ray amputation by Dr. Erlinda Hong 9/13.  -Back to the OR, underwent completion left foot first ray amputation and second ray amputation 9/15 -9/16 for tunneled HD catheter placement  New events last 24 hours / Subjective: -N.p.o., about to go down for tunneled HD catheter  Assessment & Plan:  Severe sepsis/osteomyelitis of the left great toe, present on admission -Presented with fever 103.2, tachycardia, leukocytosis, AKI -Foot x-ray revealed soft tissue air tracking along the first digit extending from the first distal phalanx to the proximal first metatarsal consistent with soft tissue infection and bony destruction along the distal aspect of the first proximal phalanx. -ABI 0.94 on the left -Blood cultures negative to date -Continue Zosyn, vancomycin discontinued -Status post excisional debridement, left foot first ray amputation by Dr. Erlinda Hong 9/13.   -Status post left foot second ray amputation,  local tissue rearrangement and wound VAC placement in OR 9/15 -Wound cultures with rare Proteus penneri -PT eval, ambulate  AKI on CKD stage IIIb -Baseline creatinine 1.7 -Creatinine worsened in the setting of vancomycin and sepsis -Renal ultrasound showed decreased corticomedullary differentiation, no hydronephrosis -Nephrology following -plan for tunneled HD catheter placement followed by hemodialysis today -Urine output 900 mL in past 24 hours -Monitor urine output and kidney function closely  Acute on chronic iron deficiency anemia, chronic blood loss from menorrhagia, Intra-Op blood loss -Transfused total 3 unit packed red blood cell  -Ferrous sulfate. Avoid IV iron in acute infectious process -Patient admits to menorrhagia, no blood from stool reported. -Hemoglobin now stable, monitor  Uncontrolled diabetes with hyperglycemia and neuropathy -Hemoglobin A1c 9.0 -Continue Lantus, mealtime NovoLog, sliding scale insulin -CBGs are stable  Abnormal liver on ultrasound -LFTs normal, patient denies any history of alcohol abuse.  She denies any right upper quadrant abdominal pain -Alk phosphatase is elevated, GGT elevated  -Hepatitis panel negative -Will need further outpatient work up once acute illness resolves  -Could have fatty liver disease as well  Morbid obesity Estimated body mass index is 49.88 kg/m as calculated from the following:   Height as of this encounter: 5' 6.5" (1.689 m).   Weight as of this encounter: 142.3 kg.   DVT prophylaxis: Add heparin subcu from tomorrow Code Status: Full code Family Communication: No family at bedside Disposition Plan:   Status is: Inpatient  Remains inpatient appropriate because:Ongoing diagnostic testing needed not appropriate for outpatient work up, IV treatments appropriate due to intensity of illness or inability to take PO and Inpatient level of care appropriate due to severity of illness   Dispo: The  patient is from:  Home              Anticipated d/c is to: Home              Anticipated d/c date is: > 3 days              Patient currently is not medically stable to d/c.  Osteomyelitis and severe renal failure   Consultants:   Orthopedic surgery  Nephrology  Procedures:  9/13:  PROCEDURE:  1.  Excisional debridement of left foot skin, subcutaneous tissue, muscle, tendon, bone 85 cm 2.  Left foot first ray amputation 3.  Application of wound VAC greater than 50 cm  9/15: First ray left foot amputation was completed and a second ray amputation followed by local tissue rearrangement and wound VAC application Dr. Sharol Given  Antimicrobials:  Anti-infectives (From admission, onward)   Start     Dose/Rate Route Frequency Ordered Stop   09/19/19 1000  ceFAZolin (ANCEF) 3 g in dextrose 5 % 50 mL IVPB  Status:  Discontinued        3 g 100 mL/hr over 30 Minutes Intravenous To Short Stay 09/19/19 0724 09/19/19 1416   09/18/19 0600  ceFAZolin (ANCEF) 3 g in dextrose 5 % 50 mL IVPB        3 g 100 mL/hr over 30 Minutes Intravenous On call to O.R. 09/17/19 1421 09/17/19 1528   09/17/19 1800  piperacillin-tazobactam (ZOSYN) IVPB 3.375 g        3.375 g 12.5 mL/hr over 240 Minutes Intravenous Every 12 hours 09/17/19 1206     09/14/19 1300  vancomycin (VANCOREADY) IVPB 1250 mg/250 mL  Status:  Discontinued        1,250 mg 166.7 mL/hr over 90 Minutes Intravenous Every 24 hours 09/13/19 1215 09/15/19 1130   09/13/19 2200  piperacillin-tazobactam (ZOSYN) IVPB 3.375 g  Status:  Discontinued        3.375 g 12.5 mL/hr over 240 Minutes Intravenous Every 8 hours 09/13/19 1215 09/13/19 1519   09/13/19 1730  piperacillin-tazobactam (ZOSYN) IVPB 3.375 g  Status:  Discontinued        3.375 g 12.5 mL/hr over 240 Minutes Intravenous Every 8 hours 09/13/19 1519 09/17/19 1206   09/13/19 1230  vancomycin (VANCOREADY) IVPB 2000 mg/400 mL        2,000 mg 200 mL/hr over 120 Minutes Intravenous  Once 09/13/19 1215 09/13/19 1635    09/13/19 1230  piperacillin-tazobactam (ZOSYN) IVPB 3.375 g        3.375 g 100 mL/hr over 30 Minutes Intravenous  Once 09/13/19 1215 09/13/19 1350   09/13/19 1215  vancomycin (VANCOCIN) IVPB 1000 mg/200 mL premix  Status:  Discontinued        1,000 mg 200 mL/hr over 60 Minutes Intravenous  Once 09/13/19 1208 09/13/19 1215       Objective: Vitals:   09/20/19 0023 09/20/19 0302 09/20/19 0800 09/20/19 1130  BP: (!) 115/54 118/60 (!) 143/69 (!) 148/73  Pulse: 64 66 69 74  Resp: 14 13 14 13   Temp: (!) 97.5 F (36.4 C) 98.6 F (37 C) 98 F (36.7 C) 98 F (36.7 C)  TempSrc: Oral Oral Oral Oral  SpO2: 97% 98% 97% 96%  Weight:      Height:        Intake/Output Summary (Last 24 hours) at 09/20/2019 1157 Last data filed at 09/20/2019 1015 Gross per 24 hour  Intake 1141.26 ml  Output 1250 ml  Net -108.74 ml  Filed Weights   09/15/19 1130 09/16/19 0500 09/17/19 0500  Weight: (!) 138 kg (!) 141.4 kg (!) 142.3 kg    Examination:  General exam: Obese pleasant female appears much older than stated age, AAO x3, no distress HEENT: Neck supple unable to assess JVD CVS: S1-S2, regular rate rhythm Lungs: Few bilateral rales at the bases Abdomen: Soft, nontender, bowel sounds present  Ext: L foot w/ toe amputations, dressing and wound vac Psychiatry: flat affect  Data Reviewed: I have personally reviewed following labs and imaging studies  CBC: Recent Labs  Lab 09/13/19 1217 09/13/19 1228 09/16/19 0700 09/17/19 0448 09/18/19 0946 09/19/19 0607 09/20/19 0859  WBC 14.6*   < > 7.6 8.5 16.4* 14.4* 10.3  NEUTROABS 12.8*  --   --   --   --   --   --   HGB 7.3*   < > 8.2* 8.4* 9.6* 9.7* 8.0*  HCT 23.9*   < > 26.2* 26.5* 30.7* 30.1* 25.0*  MCV 81.0   < > 81.6 82.3 81.6 80.7 81.2  PLT 191   < > 156 186 268 303 259   < > = values in this interval not displayed.   Basic Metabolic Panel: Recent Labs  Lab 09/16/19 0700 09/16/19 0741 09/17/19 0448 09/17/19 0448 09/18/19 0946  09/18/19 1612 09/19/19 0607 09/19/19 1541 09/20/19 0859  NA 134*  --  133*  --  133*  --  133*  --  137  K 6.2*   < > 5.2*   < > 5.7* 5.7* 6.0* 5.5* 5.9*  CL 105  --  102  --  101  --  102  --  104  CO2 18*  --  17*  --  16*  --  15*  --  17*  GLUCOSE 76  --  110*  --  148*  --  261*  --  117*  BUN 47*  --  51*  --  63*  --  74*  --  81*  CREATININE 4.51*  --  5.33*  --  5.95*  --  6.18*  --  6.45*  CALCIUM 8.5*  --  8.4*  --  8.4*  --  8.2*  --  8.1*   < > = values in this interval not displayed.   GFR: Estimated Creatinine Clearance: 16.9 mL/min (A) (by C-G formula based on SCr of 6.45 mg/dL (H)). Liver Function Tests: Recent Labs  Lab 09/16/19 0741 09/17/19 0448 09/18/19 0946 09/19/19 0607 09/20/19 0859  AST 31 23 16 20 20   ALT 15 15 9 8 7   ALKPHOS 488* 616* 599* 547* 471*  BILITOT 2.1* 2.1* 2.0* 1.1 1.0  PROT 6.2* 6.3* 5.9* 6.0* 5.6*  ALBUMIN 2.2* 1.9* 1.6* 1.7* 1.6*   No results for input(s): LIPASE, AMYLASE in the last 168 hours. No results for input(s): AMMONIA in the last 168 hours. Coagulation Profile: Recent Labs  Lab 09/13/19 1217 09/15/19 1830  INR 1.5* 1.3*   Cardiac Enzymes: No results for input(s): CKTOTAL, CKMB, CKMBINDEX, TROPONINI in the last 168 hours. BNP (last 3 results) No results for input(s): PROBNP in the last 8760 hours. HbA1C: No results for input(s): HGBA1C in the last 72 hours. CBG: Recent Labs  Lab 09/19/19 1226 09/19/19 1601 09/19/19 2105 09/20/19 0813 09/20/19 1131  GLUCAP 232* 185* 185* 126* 93   Lipid Profile: No results for input(s): CHOL, HDL, LDLCALC, TRIG, CHOLHDL, LDLDIRECT in the last 72 hours. Thyroid Function Tests: No results for input(s): TSH, T4TOTAL,  FREET4, T3FREE, THYROIDAB in the last 72 hours. Anemia Panel: No results for input(s): VITAMINB12, FOLATE, FERRITIN, TIBC, IRON, RETICCTPCT in the last 72 hours. Sepsis Labs: Recent Labs  Lab 09/13/19 1204  LATICACIDVEN 1.7    Recent Results (from the past  240 hour(s))  Culture, blood (Routine x 2)     Status: None   Collection Time: 09/13/19 12:58 PM   Specimen: BLOOD  Result Value Ref Range Status   Specimen Description BLOOD SITE NOT SPECIFIED  Final   Special Requests   Final    BOTTLES DRAWN AEROBIC AND ANAEROBIC Blood Culture results may not be optimal due to an excessive volume of blood received in culture bottles   Culture   Final    NO GROWTH 5 DAYS Performed at [facility], [place] [address] [place], [place] [address]    Report Status 09/18/2019 FINAL  Final  Culture, blood (Routine x 2)     Status: None   Collection Time: 09/13/19  1:08 PM   Specimen: BLOOD  Result Value Ref Range Status   Specimen Description BLOOD SITE NOT SPECIFIED  Final   Special Requests   Final    BOTTLES DRAWN AEROBIC AND ANAEROBIC Blood Culture adequate volume   Culture   Final    NO GROWTH 5 DAYS Performed at [facility], [place] [address], [place], [place] [address]    Report Status 09/18/2019 FINAL  Final  SARS Coronavirus 2 by RT PCR (hospital order, performed in [facility] hospital lab) Nasopharyngeal Nasopharyngeal Swab     Status: None   Collection Time: 09/13/19  2:02 PM   Specimen: Nasopharyngeal Swab  Result Value Ref Range Status   SARS Coronavirus 2 NEGATIVE NEGATIVE Final    Comment: (NOTE) SARS-CoV-2 target nucleic acids are NOT DETECTED.  The SARS-CoV-2 RNA is generally detectable in upper and lower respiratory specimens during the acute phase of infection. The lowest concentration of SARS-CoV-2 viral copies this assay can detect is 250 copies / mL. A negative result does not preclude SARS-CoV-2 infection and should not be used as the sole basis for treatment or other patient management decisions.  A negative result may occur with improper specimen collection / handling, submission of specimen other than nasopharyngeal swab, presence of viral mutation(s) within the areas targeted by this assay, and inadequate number  of viral copies (<250 copies / mL). A negative result must be combined with clinical observations, patient history, and epidemiological information.  Fact Sheet for Patients:   StrictlyIdeas.no  Fact Sheet for Healthcare Providers: BankingDealers.co.za  This test is not yet approved or  cleared by the [place] FDA and has been authorized for detection and/or diagnosis of SARS-CoV-2 by FDA under an Emergency Use Authorization (EUA).  This EUA will remain in effect (meaning this test can be used) for the duration of the COVID-19 declaration under Section 564(b)(1) of the Act, 21 U.S.C. section 360bbb-3(b)(1), unless the authorization is terminated or revoked sooner.  Performed at [facility], [place] [address] [place], [place] [address]   Urine culture     Status: Abnormal   Collection Time: 09/13/19  8:06 PM   Specimen: In/Out Cath Urine  Result Value Ref Range Status   Specimen Description IN/OUT CATH URINE  Final   Special Requests   Final    NONE Performed at [facility], [place] [address] [place], [place] [address]    Culture MULTIPLE SPECIES PRESENT, SUGGEST RECOLLECTION (A)  Final  Report Status 09/14/2019 FINAL  Final  MRSA PCR Screening     Status: None   Collection Time: 09/15/19  4:55 AM   Specimen: Nasal Mucosa; Nasopharyngeal  Result Value Ref Range Status   MRSA by PCR NEGATIVE NEGATIVE Final    Comment:        The GeneXpert MRSA Assay (FDA approved for NASAL specimens only), is one component of a comprehensive MRSA colonization surveillance program. It is not intended to diagnose MRSA infection nor to guide or monitor treatment for MRSA infections. Performed at [facility], [place] [address] [place], [place] [address]   Aerobic/Anaerobic Culture (surgical/deep wound)     Status: None (Preliminary result)   Collection Time: 09/17/19  4:18 PM   Specimen: Soft Tissue, Other  Result Value  Ref Range Status   Specimen Description TISSUE LEFT TOE  Final   Special Requests LEFT GREAT TOE  Final   Gram Stain   Final    MODERATE WBC PRESENT,BOTH PMN AND MONONUCLEAR FEW GRAM POSITIVE COCCI Performed at [facility], [place] [address] [place], [place] [address]    Culture RARE PROTEUS PENNERI  Final   Report Status PENDING  Incomplete   Organism ID, Bacteria PROTEUS PENNERI  Final      Susceptibility   Proteus penneri - MIC*    AMPICILLIN <=2 SENSITIVE Sensitive     CEFAZOLIN <=4 SENSITIVE Sensitive     CEFEPIME <=0.12 SENSITIVE Sensitive     CEFTAZIDIME <=1 SENSITIVE Sensitive     CEFTRIAXONE <=0.25 SENSITIVE Sensitive     CIPROFLOXACIN <=0.25 SENSITIVE Sensitive     GENTAMICIN <=1 SENSITIVE Sensitive     IMIPENEM 2 SENSITIVE Sensitive     TRIMETH/SULFA <=20 SENSITIVE Sensitive     AMPICILLIN/SULBACTAM <=2 SENSITIVE Sensitive     PIP/TAZO <=4 SENSITIVE Sensitive     * RARE PROTEUS PENNERI  Surgical PCR screen     Status: None   Collection Time: 09/17/19 10:21 PM   Specimen: Nasal Mucosa; Nasal Swab  Result Value Ref Range Status   MRSA, PCR NEGATIVE NEGATIVE Final   Staphylococcus aureus NEGATIVE NEGATIVE Final    Comment: (NOTE) The Xpert SA Assay (FDA approved for NASAL specimens in patients 35 years of age and older), is one component of a comprehensive surveillance program. It is not intended to diagnose infection nor to guide or monitor treatment. Performed at [facility], [place] [address], [place],  [address]       Radiology Studies: No results found.    Scheduled Meds: . Chlorhexidine Gluconate Cloth  6 each Topical Daily  . Chlorhexidine Gluconate Cloth  6 each Topical Q0600  . cloNIDine  0.1 mg Oral BID  . docusate sodium  100 mg Oral BID  . feeding supplement  1 Container Oral TID BM  . ferrous sulfate  325 mg Oral BID WC  . insulin aspart  0-15 Units Subcutaneous TID WC  . insulin aspart  0-5 Units Subcutaneous QHS  . insulin  aspart  5 Units Subcutaneous TID WC  . insulin glargine  45 Units Subcutaneous QHS  . mupirocin ointment  1 application Nasal BID  . pregabalin  150 mg Oral QHS  . sodium bicarbonate  50 mEq Intravenous Once  . sodium zirconium cyclosilicate  10 g Oral Daily   Continuous Infusions: . sodium chloride 500 mL/hr at 09/17/19 1605  . sodium chloride    . sodium chloride    . sodium chloride Stopped (09/19/19  1713)  . piperacillin-tazobactam (ZOSYN)  IV 3.375 g (09/20/19 0924)     LOS: 7 days   Time spent: 25 minutes   Domenic Polite, MD [facility] 09/20/2019, 11:57 AM

## 2019-09-20 NOTE — Progress Notes (Signed)
[facility] Progress Note  Subjective:  Feels ok.  She had 900 mL uop over 9/15.  We discussed the risks/benefits/indications for dialysis again today and she consented to dialysis stating that she would do "whatever it takes to be with my family - I have kids". Labs just drawn.  Not on oxygen at home and has been on 2 liters here and was told might need at home.  Review of systems:   She denies shortness of breath   Denies n/v Denies Cp  Has been NPO     Vitals:   09/19/19 2228 09/20/19 0023 09/20/19 0302 09/20/19 0800  BP: 127/63 (!) 115/54 118/60 (!) 143/69  Pulse:  64 66 69  Resp:  14 13 14   Temp:  (!) 97.5 F (36.4 C) 98.6 F (37 C) 98 F (36.7 C)  TempSrc:  Oral Oral Oral  SpO2:  97% 98% 97%  Weight:      Height:        Exam: Gen adult female in bed in NAD at rest  NCAT Sclera anicteric Clear anteriorly and unlabored  S1S2 no rub Abd soft nt obese habitus GU foley in place Ext 2+ edema,  L foot wrapped Neuro is awake and conversant, follows commands Psych normal mood and affect   Home meds:  - clonidine 0.2 bid/ crestor 20/ lasix 20 bid prn/ lisinopril-hctz 20-12.5 qd  - suboxone 8/2 two films qd/ zanaflex 4 tid/ lyrica 200 tid/ klonopin 0.5 bid prn  - insulin aspart 20u tid ac/ lantus 80u hs  - megace 40 tid prn  - prn's/ vitamins/ supplements    UA 9/9 - >50 wbc, 11-20 epi, 0-5 rbc, many bact, >300 prot, turbid   CXR 9/9 - IMPRESSION: Lungs clear. Heart borderline enlarged. No adenopathy.      Assessment/ Plan:  1. AoCKD 3b - b/l creat 1.7 from 10d ago, eGFR 35 ml/min.  AKI in setting of gangrenous foot vs possible infectious GN with chronic (> 72mo) and severe foot infection; also with volume overload.  Note had rec'd 3.5 gm IV vanc, unlikely to cause AKI. DC'd IV vanc.  +proteinuria > 300.  UPC ratio with 3150 mg/g - may be secondary to DM as well  1. consulted IR for tunneled HD catheter  2. For HD today, 9/16 after access  placement with IR.  Plan for HD on 9/18 as well and assess needs daily   3. Continue foley   2. Hyperkalemia - despite lokelma daily and additional kayexalate on 9/14. Bicarb once now  3. Gangrene/ osteo L foot - IV abx and s/p amputation 4. IDDM - per primary team.  With proteinuria.   5. Bipolar d/o - per primary team  6. HTN - home lisinopril/ hctz are on hold appropriately.  Restarted home clonidine at reduced dose 7. Metabolic acidosis - on bicarb  8. Normocytic anemia - no acute indication for PRBC's. Await labs  9. Obesity   Recent Labs  Lab 09/18/19 0946 09/18/19 1612 09/19/19 0607 09/19/19 1541  K 5.7*   < > 6.0* 5.5*  BUN 63*  --  74*  --   CREATININE 5.95*  --  6.18*  --   CALCIUM 8.4*  --  8.2*  --   HGB 9.6*  --  9.7*  --    < > = values in this interval not displayed.   Inpatient medications: . Chlorhexidine Gluconate Cloth  6 each Topical Daily  . Chlorhexidine Gluconate Cloth  6  each Topical [id number]  . cloNIDine  0.1 mg Oral BID  . docusate sodium  100 mg Oral BID  . feeding supplement  1 Container Oral TID BM  . ferrous sulfate  325 mg Oral BID WC  . insulin aspart  0-15 Units Subcutaneous TID WC  . insulin aspart  0-5 Units Subcutaneous QHS  . insulin aspart  5 Units Subcutaneous TID WC  . insulin glargine  45 Units Subcutaneous QHS  . mupirocin ointment  1 application Nasal BID  . pregabalin  150 mg Oral QHS  . sodium bicarbonate  50 mEq Intravenous Once  . sodium bicarbonate  650 mg Oral TID  . sodium zirconium cyclosilicate  10 g Oral Daily   . sodium chloride 500 mL/hr at 09/17/19 1605  . sodium chloride    . sodium chloride    . sodium chloride Stopped (09/19/19 1713)  . piperacillin-tazobactam (ZOSYN)  IV 12.5 mL/hr at 09/19/19 1800   sodium chloride, sodium chloride, acetaminophen **OR** acetaminophen, albuterol, alteplase, clonazePAM, heparin, hydrALAZINE, HYDROmorphone (DILAUDID) injection, hydrOXYzine, lidocaine (PF), lidocaine-prilocaine,  metoCLOPramide **OR** metoCLOPramide (REGLAN) injection, ondansetron **OR** ondansetron (ZOFRAN) IV, oxyCODONE-acetaminophen, pantoprazole, pentafluoroprop-tetrafluoroeth    Claudia Desanctis, MD 09/20/2019 9:17 AM

## 2019-09-20 NOTE — Evaluation (Signed)
Physical Therapy Evaluation Patient Details Name: Felicia Reynolds MRN: [id number] DOB: 1980-12-23 Today's Date: 09/20/2019   History of Present Illness  Felicia Reynolds is a 39 y.o. female with medical history significant of IDDM with insulin resistance, diabetic neuropathy, hypertension, CKD stage II, chronic iron deficiency anemia secondary to menorrhagia, Suboxone therapy, presented with worsening of left toe/foot infection. 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  She is awaiting IR procedure for IJ tunneled dialysis access  Clinical Impression  Patient presents with decreased mobility due to general weakness, decreased activity tolerance, decreased balance and she will benefit from skilled PT in the acute setting.  She needed min A only for up to EOB and back to supine, but was unwilling/able to attempt standing with limited weight bearing on L foot due to feeling weak as not eating waiting on IR placement of dialysis catheter planned for today.  She was tearful and needed encouragement and her spouse called during the session and she wanted to talk with him as reports unable to speak with him since admission.  She may need CIR level rehab prior to d/c home.  PT to follow up.     Follow Up Recommendations CIR    Equipment Recommendations  Wheelchair cushion (measurements PT);Wheelchair (measurements PT)    Recommendations for Other Services Rehab consult;OT consult     Precautions / Restrictions Precautions Precautions: Fall Required Braces or Orthoses: Other Brace Other Brace: post op shoe L LE Restrictions Weight Bearing Restrictions: Yes LLE Weight Bearing: Touchdown weight bearing (limited on heel in post op shoe)      Mobility  Bed Mobility Overal bed mobility: Needs Assistance Bed Mobility: Supine to Sit;Sit to Supine     Supine to sit: Min assist;HOB  elevated Sit to supine: Min assist   General bed mobility comments: increased time and cues for moving hips close to EOB feet off the bed and HHA to lift trunk; able to return to supine with A for L LE  Transfers                 General transfer comment: initiated sit to stand, but pt reports unable due to not eating and fatigued, tearful due to situation  Ambulation/Gait                Stairs            Wheelchair Mobility    Modified Rankin (Stroke Patients Only)       Balance Overall balance assessment: Needs assistance Sitting-balance support: Feet supported Sitting balance-Leahy Scale: Good Sitting balance - Comments: sat EOB to brush teeth, (left set up with NT to assist due to pt requesting to talk on the phone with her spouse when he called)       Standing balance comment: NT                             Pertinent Vitals/Pain Pain Assessment: 0-10 Pain Score: 8  Pain Location: L foot Pain Descriptors / Indicators: Aching;Discomfort Pain Intervention(s): Monitored during session    Home Living Family/patient expects to be discharged to:: Private residence Living Arrangements: Children Available Help at Discharge: Family Type of Home: Mobile home Home Access: Stairs to enter Entrance Stairs-Rails: Right;Left;Can reach both Entrance Stairs-Number of Steps: 3 Home Layout: One level Home  Equipment: Other (comment);Wheelchair - manual (walking stick; w/c with missing parts and narrow) Additional Comments: children x 4 age 37-18, spouse no longer lives with her, but she states needs to talk with him about getting a ramp for the home    Prior Function Level of Independence: Independent with assistive device(s)               Hand Dominance   Dominant Hand: Right    Extremity/Trunk Assessment   Upper Extremity Assessment Upper Extremity Assessment: Generalized weakness;RUE deficits/detail RUE Deficits / Details: limited  grip due to IV in hand    Lower Extremity Assessment Lower Extremity Assessment: RLE deficits/detail;LLE deficits/detail RLE Deficits / Details: difficulty lifting antigravity from hip, but ROM WFL RLE Sensation: decreased light touch;history of peripheral neuropathy LLE Deficits / Details: foot wrapped with coban with vac; moves ankle some, but not full range, difficulty lifting to move leg off the bed LLE: Unable to fully assess due to pain    Cervical / Trunk Assessment Cervical / Trunk Assessment: Kyphotic  Communication   Communication: No difficulties  Cognition Arousal/Alertness: Awake/alert Behavior During Therapy: WFL for tasks assessed/performed (tearful) Overall Cognitive Status: Impaired/Different from baseline Area of Impairment: Orientation;Memory;Safety/judgement;Problem solving                 Orientation Level: Disoriented to;Time   Memory: Decreased short-term memory   Safety/Judgement: Decreased awareness of safety   Problem Solving: Slow processing General Comments: overwhelmed by her situation, does not know how long she has been here and reports not keeping up with time regularly (though her kids are in school)      General Comments General comments (skin integrity, edema, etc.): BP stable in sitting, SpO2 WNL on RA (was on O2 to start)    Exercises     Assessment/Plan    PT Assessment Patient needs continued PT services  PT Problem List Decreased strength;Decreased mobility;Decreased knowledge of precautions;Decreased knowledge of use of DME;Decreased activity tolerance;Pain       PT Treatment Interventions DME instruction;Therapeutic activities;Gait training;Therapeutic exercise;Patient/family education;Balance training;Functional mobility training;Wheelchair mobility training    PT Goals (Current goals can be found in the Care Plan section)  Acute Rehab PT Goals Patient Stated Goal: to go home PT Goal Formulation: With patient Time For  Goal Achievement: 10/04/19 Potential to Achieve Goals: Good    Frequency Min 3X/week   Barriers to discharge        Co-evaluation               AM-PAC PT "6 Clicks" Mobility  Outcome Measure Help needed turning from your back to your side while in a flat bed without using bedrails?: A Little Help needed moving from lying on your back to sitting on the side of a flat bed without using bedrails?: A Little Help needed moving to and from a bed to a chair (including a wheelchair)?: Total Help needed standing up from a chair using your arms (e.g., wheelchair or bedside chair)?: Total Help needed to walk in hospital room?: Total Help needed climbing 3-5 steps with a railing? : Total 6 Click Score: 10    End of Session   Activity Tolerance: Patient limited by fatigue;Patient limited by pain Patient left: in bed;with call bell/phone within reach;Other (comment);with bed alarm set (sitting EOB talking with spouse) Nurse Communication: Mobility status PT Visit Diagnosis: Other abnormalities of gait and mobility (R26.89);Muscle weakness (generalized) (M62.81);Difficulty in walking, not elsewhere classified (R26.2)    Time: 1610-9604 PT Time  Calculation (min) (ACUTE ONLY): 29 min   Charges:   PT Evaluation $PT Eval Moderate Complexity: 1 Mod PT Treatments $Therapeutic Activity: 8-22 mins        Magda Kiel, PT Acute Rehabilitation Services [facility]:[phone] Office:[phone] 09/20/2019   Reginia Naas 09/20/2019, 11:19 AM

## 2019-09-20 NOTE — Progress Notes (Signed)
Patient is back from IR, Cath was placed to the right upper chest. Patient is stable a VS and BS are WNL. I called dialysis to inform them the patient was now ready for Dialysis. Patient will be going down soon.
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
## 2019-09-21 LAB — COMPREHENSIVE METABOLIC PANEL
ALT: 7 U/L (ref 0–44)
AST: 16 U/L (ref 15–41)
Albumin: 1.6 g/dL — ABNORMAL LOW (ref 3.5–5.0)
Alkaline Phosphatase: 394 U/L — ABNORMAL HIGH (ref 38–126)
Anion gap: 6 (ref 5–15)
BUN: 60 mg/dL — ABNORMAL HIGH (ref 6–20)
CO2: 23 mmol/L (ref 22–32)
Calcium: 7.7 mg/dL — ABNORMAL LOW (ref 8.9–10.3)
Chloride: 106 mmol/L (ref 98–111)
Creatinine, Ser: 5.43 mg/dL — ABNORMAL HIGH (ref 0.44–1.00)
GFR calc Af Amer: 10 mL/min — ABNORMAL LOW (ref >60)
GFR calc non Af Amer: 9 mL/min — ABNORMAL LOW (ref >60)
Glucose, Bld: 90 mg/dL (ref 70–99)
Potassium: 4.8 mmol/L (ref 3.5–5.1)
Sodium: 135 mmol/L (ref 135–145)
Total Bilirubin: 0.8 mg/dL (ref 0.3–1.2)
Total Protein: 5.6 g/dL — ABNORMAL LOW (ref 6.5–8.1)

## 2019-09-21 LAB — GLUCOSE, CAPILLARY
Glucose-Capillary: 57 mg/dL — ABNORMAL LOW (ref 70–99)
Glucose-Capillary: 59 mg/dL — ABNORMAL LOW (ref 70–99)
Glucose-Capillary: 59 mg/dL — ABNORMAL LOW (ref 70–99)
Glucose-Capillary: 112 mg/dL — ABNORMAL HIGH (ref 70–99)
Glucose-Capillary: 131 mg/dL — ABNORMAL HIGH (ref 70–99)
Glucose-Capillary: 175 mg/dL — ABNORMAL HIGH (ref 70–99)
Glucose-Capillary: 246 mg/dL — ABNORMAL HIGH (ref 70–99)

## 2019-09-21 MED ORDER — INSULIN ASPART 100 UNIT/ML ~~LOC~~ SOLN
3.0000 [IU] | Freq: Three times a day (TID) | SUBCUTANEOUS | Status: DC
  Administered 2019-09-22 – 2019-09-24 (×7): 3 [IU] via SUBCUTANEOUS

## 2019-09-21 MED ORDER — CHLORHEXIDINE GLUCONATE CLOTH 2 % EX PADS
6.0000 | MEDICATED_PAD | Freq: Every day | CUTANEOUS | Status: DC
  Administered 2019-09-22 – 2019-09-23 (×2): 6 via TOPICAL

## 2019-09-21 MED ORDER — DARBEPOETIN ALFA 40 MCG/0.4ML IJ SOSY
40.0000 ug | PREFILLED_SYRINGE | Freq: Once | INTRAMUSCULAR | Status: AC
  Administered 2019-09-21: 40 ug via SUBCUTANEOUS
  Filled 2019-09-21: qty 0.4

## 2019-09-21 MED ORDER — PREGABALIN 75 MG PO CAPS
75.0000 mg | ORAL_CAPSULE | Freq: Every day | ORAL | Status: DC
  Administered 2019-09-21 – 2019-09-23 (×3): 75 mg via ORAL
  Filled 2019-09-21 (×3): qty 1

## 2019-09-21 MED ORDER — HEPARIN SODIUM (PORCINE) 5000 UNIT/ML IJ SOLN
5000.0000 [IU] | Freq: Three times a day (TID) | INTRAMUSCULAR | Status: DC
  Administered 2019-09-21 – 2019-09-24 (×8): 5000 [IU] via SUBCUTANEOUS
  Filled 2019-09-21 (×8): qty 1

## 2019-09-21 MED ORDER — INSULIN GLARGINE 100 UNIT/ML ~~LOC~~ SOLN
35.0000 [IU] | Freq: Every day | SUBCUTANEOUS | Status: DC
  Administered 2019-09-21 – 2019-09-23 (×3): 35 [IU] via SUBCUTANEOUS
  Filled 2019-09-21 (×4): qty 0.35

## 2019-09-21 NOTE — Progress Notes (Signed)
[facility] Progress Note  Subjective:  She had her first HD on 9/16 after tunneled catheter with IR.  (was kept even).  She had 1.8 liters UOP over 9/16.  Feels better today.  Misses her family.  We discussed risks/benefits/indications for ESA and she consents to ESA.  Blood sugars low - has had breakfast.   Review of systems:    She denies shortness of breath   Denies n/v Denies Cp  Has been NPO     Vitals:   09/20/19 2342 09/21/19 0337 09/21/19 0500 09/21/19 0747  BP: (!) 144/75 136/72  (!) 144/70  Pulse: 74 67  71  Resp: 16 13  11  Temp: 98.9 F (37.2 C) 98.8 F (37.1 C)  98.4 F (36.9 C)  TempSrc: Oral Oral  Oral  SpO2: 92% 96%  95%  Weight:   (!) 142.4 kg   Height:        Exam: Gen adult female in bed in NAD at rest NCAT Sclera anicteric Clear anteriorly and unlabored  S1S2 no rub Abd soft nt obese habitus GU foley in place Ext 1-2+ edema,  L foot wrapped Neuro is awake and conversant, follows commands Psych normal mood and affect Access: RIJ tunneled HD catheter   Home meds:  - clonidine 0.2 bid/ crestor 20/ lasix 20 bid prn/ lisinopril-hctz 20-12.5 qd  - suboxone 8/2 two films qd/ zanaflex 4 tid/ lyrica 200 tid/ klonopin 0.5 bid prn  - insulin aspart 20u tid ac/ lantus 80u hs  - megace 40 tid prn  - prn's/ vitamins/ supplements    UA 9/9 - >50 wbc, 11-20 epi, 0-5 rbc, many bact, >300 prot, turbid   CXR 9/9 - IMPRESSION: Lungs clear. Heart borderline enlarged. No adenopathy.      Assessment/ Plan:  1. AoCKD 3b - b/l creat 1.7 from 10d ago, eGFR 35 ml/min.  AKI in setting of gangrenous foot vs possible infectious GN with chronic (> 2mos) and severe foot infection; also with volume overload.  Note had rec'd 3.5 gm IV vanc, unlikely to cause AKI. DC'd IV vanc.  +proteinuria > 300.  UPC ratio with 3150 mg/g - may be secondary to DM as well.  RIJ tunneled HD catheter on 9/16 with IR and first HD on 9/16.    1. Plan for HD on 9/18 as  well.  Assess needs daily  2. Continue foley but trial of voiding soon   2. Hyperkalemia - despite lokelma daily.  Stop lokelma as now on HD.  3. Gangrene/ osteo L foot - IV abx and s/p amputation 4. IDDM - per primary team.  With proteinuria.   5. Bipolar d/o - per primary team  6. HTN - home lisinopril/ hctz are on hold appropriately.  Restarted home clonidine at reduced dose  7. Metabolic acidosis - s/p bicarb and now managing with HD   8. Normocytic anemia - no acute indication for PRBC's.  On PO iron.  aranesp 40 mcg once on 9/17 ordered  Recent Labs  Lab 09/19/19 0607 09/19/19 1541 09/20/19 0859 09/21/19 0512  K 6.0*   < > 5.9* 4.8  BUN 74*  --  81* 60*  CREATININE 6.18*  --  6.45* 5.43*  CALCIUM 8.2*  --  8.1* 7.7*  HGB 9.7*  --  8.0*  --    < > = values in this interval not displayed.   Inpatient medications: . cephALEXin  500 mg Oral Q12H  . Chlorhexidine Gluconate Cloth  6   each Topical Daily  . Chlorhexidine Gluconate Cloth  6 each Topical Q0600  . cloNIDine  0.1 mg Oral BID  . docusate sodium  100 mg Oral BID  . feeding supplement  1 Container Oral TID BM  . ferrous sulfate  325 mg Oral BID WC  . insulin aspart  0-15 Units Subcutaneous TID WC  . insulin aspart  0-5 Units Subcutaneous QHS  . insulin aspart  5 Units Subcutaneous TID WC  . insulin glargine  45 Units Subcutaneous QHS  . mupirocin ointment  1 application Nasal BID  . pregabalin  150 mg Oral QHS  . sodium bicarbonate  50 mEq Intravenous Once  . sodium zirconium cyclosilicate  10 g Oral Daily   . sodium chloride Stopped (09/20/19 1519)  . sodium chloride Stopped (09/19/19 1713)   acetaminophen **OR** acetaminophen, albuterol, clonazePAM, hydrALAZINE, HYDROmorphone (DILAUDID) injection, hydrOXYzine, ondansetron **OR** ondansetron (ZOFRAN) IV, oxyCODONE-acetaminophen, pantoprazole    Claudia Desanctis, MD 09/21/2019 10:19 AM

## 2019-09-21 NOTE — Progress Notes (Signed)
PROGRESS NOTE    FELECITY LEMASTER  KLK:[id number] DOB: [birth year] DOA: 09/13/2019 PCP: [facility]     Brief Narrative:  RAYGEN LINQUIST is a 39/F w IDDM, diabetic neuropathy, hypertension, CKD stage IIIb, chronic iron deficiency anemia, Suboxone therapy, presented with worsening of left toe/foot infection.  10 days ago, patient stepped on piece of mirror which cut open her left toe and distal left foot was seen by her PCP who prescribed Augmentin, subsequently developed worsening pain and swelling with purulent foul-smelling discharge, associated fevers and chills. - In the ED, she was hypotensive, tachycardic febrile to 103, worsening of creatinine level to 2.6. -  Left foot x-ray showed soft tissue air tracking, soft tissue destruction and osteomyelitis,  orthopedic surgery was consulted.  Patient was started on IV Zosyn and vancomycin.  -Her kidney function continued to gradually worsen nephrology was consulted.  Vancomycin was stopped.   -Status post excisional debridement, left foot first ray amputation by Dr. Erlinda Hong 9/13.  -Back to the OR, underwent completion left foot first ray amputation and second ray amputation 9/15 -9/16 underwent  tunneled HD catheter placement and first HD  Subjective: -Feels okay, tolerated dialysis yesterday, misses her family and wants to go home  Assessment & Plan:  Severe sepsis/osteomyelitis of the left great toe, present on admission -Presented with fever 103.2, tachycardia, leukocytosis, AKI -Foot x-ray revealed soft tissue air tracking along the first digit extending from the first distal phalanx to the proximal first metatarsal consistent with soft tissue infection and bony destruction along the distal aspect of the first proximal phalanx. -ABI 0.94 on the left -Blood cultures negative to date -Vancomycin discontinued in the setting of renal failure, has been on IV Zosyn which was changed 9/16 to Ancef -Status post excisional debridement,  left foot first ray amputation by Dr. Erlinda Hong 9/13.   -Status post left foot second ray amputation, local tissue rearrangement and wound VAC placement in OR 9/15 -Wound cultures with rare Proteus penneri -Discontinue antibiotics today, per Ortho only antibiotics for 24 hours postop recommended, continue wound VAC, minimal weightbearing on the left heel only as needed -PT following, CIR evaluating  AKI on CKD stage IIIb -Baseline creatinine 1.7 -Creatinine worsened in the setting of vancomycin and sepsis and trended up to 6 -Renal ultrasound showed decreased corticomedullary differentiation, no hydronephrosis -Nephrology following, underwent tunneled HD catheter placement on 9/16 followed by first hemodialysis 9/16 -Appears to be recovering -Urine output of 1800 mL yesterday, hopeful for renal recovery -Hopefully can hold off on HD over the weekend and monitor -BMP in a.m.  Acute on chronic iron deficiency anemia, chronic blood loss from menorrhagia, Intra-Op blood loss -Transfused total 3 unit packed red blood cell  -Ferrous sulfate. Avoid IV iron in acute infectious process -Patient admits to menorrhagia, no blood from stool reported. -Hemoglobin was stable, cbc in am  Uncontrolled diabetes with hyperglycemia and neuropathy -Hemoglobin A1c 9.0 -on Lantus, mealtime NovoLog, sliding scale insulin -CBGs are low today, will decrease Lantus and meal coverage  Abnormal liver on ultrasound -LFTs normal, patient denies any history of alcohol abuse.  She denies any right upper quadrant abdominal pain -Alk phosphatase is elevated, GGT elevated  -Hepatitis panel negative -Will need further outpatient work up once acute illness resolves  -Could have fatty liver disease as well  Morbid obesity Estimated body mass index is 49.91 kg/m as calculated from the following:   Height as of this encounter: 5' 6.5" (1.689 m).   Weight as of this  encounter: 142.4 kg.   Anxiety/depression Chronic  pain -Remains on Klonopin, stop hydroxyzine -Also cut down Lyrica dose -Also remains on oxycodone, dose decreased from home regimen   DVT prophylaxis: Add heparin subcu  Code Status: Full code Family Communication: No family at bedside Disposition Plan:   Status is: Inpatient  Remains inpatient appropriate because:Ongoing diagnostic testing needed not appropriate for outpatient work up, IV treatments appropriate due to intensity of illness or inability to take PO and Inpatient level of care appropriate due to severity of illness   Dispo: The patient is from: Home              Anticipated d/c is to: Possibly CIR              Anticipated d/c date is: > 3 days              Patient currently is not medically stable to d/c.  Osteomyelitis and severe renal failure   Consultants:   Orthopedic surgery  Nephrology  Procedures:  9/13:  PROCEDURE:  1.  Excisional debridement of left foot skin, subcutaneous tissue, muscle, tendon, bone 85 cm 2.  Left foot first ray amputation 3.  Application of wound VAC greater than 50 cm  9/15: First ray left foot amputation was completed and a second ray amputation followed by local tissue rearrangement and wound VAC application Dr. Sharol Given  Antimicrobials:  Anti-infectives (From admission, onward)   Start     Dose/Rate Route Frequency Ordered Stop   09/20/19 1800  cephALEXin (KEFLEX) capsule 500 mg        500 mg Oral Every 12 hours 09/20/19 1420     09/20/19 1352  ceFAZolin (ANCEF) 2-4 GM/100ML-% IVPB       Note to Pharmacy: Yolanda Manges   : cabinet override      09/20/19 1352 09/21/19 0159   09/20/19 1345  ceFAZolin (ANCEF) IVPB 2g/100 mL premix        2 g 200 mL/hr over 30 Minutes Intravenous  Once 09/20/19 1257 09/20/19 1440   09/19/19 1000  ceFAZolin (ANCEF) 3 g in dextrose 5 % 50 mL IVPB  Status:  Discontinued        3 g 100 mL/hr over 30 Minutes Intravenous To Short Stay 09/19/19 0724 09/19/19 1416   09/18/19 0600  ceFAZolin (ANCEF) 3  g in dextrose 5 % 50 mL IVPB        3 g 100 mL/hr over 30 Minutes Intravenous On call to O.R. 09/17/19 1421 09/17/19 1528   09/17/19 1800  piperacillin-tazobactam (ZOSYN) IVPB 3.375 g  Status:  Discontinued        3.375 g 12.5 mL/hr over 240 Minutes Intravenous Every 12 hours 09/17/19 1206 09/20/19 1420   09/14/19 1300  vancomycin (VANCOREADY) IVPB 1250 mg/250 mL  Status:  Discontinued        1,250 mg 166.7 mL/hr over 90 Minutes Intravenous Every 24 hours 09/13/19 1215 09/15/19 1130   09/13/19 2200  piperacillin-tazobactam (ZOSYN) IVPB 3.375 g  Status:  Discontinued        3.375 g 12.5 mL/hr over 240 Minutes Intravenous Every 8 hours 09/13/19 1215 09/13/19 1519   09/13/19 1730  piperacillin-tazobactam (ZOSYN) IVPB 3.375 g  Status:  Discontinued        3.375 g 12.5 mL/hr over 240 Minutes Intravenous Every 8 hours 09/13/19 1519 09/17/19 1206   09/13/19 1230  vancomycin (VANCOREADY) IVPB 2000 mg/400 mL        2,000 mg 200 mL/hr  over 120 Minutes Intravenous  Once 09/13/19 1215 09/13/19 1635   09/13/19 1230  piperacillin-tazobactam (ZOSYN) IVPB 3.375 g        3.375 g 100 mL/hr over 30 Minutes Intravenous  Once 09/13/19 1215 09/13/19 1350   09/13/19 1215  vancomycin (VANCOCIN) IVPB 1000 mg/200 mL premix  Status:  Discontinued        1,000 mg 200 mL/hr over 60 Minutes Intravenous  Once 09/13/19 1208 09/13/19 1215       Objective: Vitals:   09/20/19 2342 09/21/19 0337 09/21/19 0500 09/21/19 0747  BP: (!) 144/75 136/72  (!) 144/70  Pulse: 74 67  71  Resp: 16 13  11   Temp: 98.9 F (37.2 C) 98.8 F (37.1 C)  98.4 F (36.9 C)  TempSrc: Oral Oral  Oral  SpO2: 92% 96%  95%  Weight:   (!) 142.4 kg   Height:        Intake/Output Summary (Last 24 hours) at 09/21/2019 1154 Last data filed at 09/21/2019 0340 Gross per 24 hour  Intake 636.13 ml  Output 1450 ml  Net -813.87 ml   Filed Weights   09/17/19 0500 09/21/19 0500  Weight: (!) 142.3 kg (!) 142.4 kg    Examination:  General  exam: Obese pleasant female appears much older than stated age, awake alert oriented x3, no distress HEENT: Neck supple no JVD CVS: S1-S2, regular rate rhythm Lungs: Decreased breath sounds at the bases, otherwise clear Abdomen: Soft, nontender, bowel sounds present Ext: L foot w/ toe amputations, dressing and wound vac Psychiatry: flat affect  Data Reviewed: I have personally reviewed following labs and imaging studies  CBC: Recent Labs  Lab 09/16/19 0700 09/17/19 0448 09/18/19 0946 09/19/19 0607 09/20/19 0859  WBC 7.6 8.5 16.4* 14.4* 10.3  HGB 8.2* 8.4* 9.6* 9.7* 8.0*  HCT 26.2* 26.5* 30.7* 30.1* 25.0*  MCV 81.6 82.3 81.6 80.7 81.2  PLT 156 186 268 303 970   Basic Metabolic Panel: Recent Labs  Lab 09/17/19 0448 09/17/19 0448 09/18/19 0946 09/18/19 0946 09/18/19 1612 09/19/19 0607 09/19/19 1541 09/20/19 0859 09/21/19 0512  NA 133*  --  133*  --   --  133*  --  137 135  K 5.2*   < > 5.7*   < > 5.7* 6.0* 5.5* 5.9* 4.8  CL 102  --  101  --   --  102  --  104 106  CO2 17*  --  16*  --   --  15*  --  17* 23  GLUCOSE 110*  --  148*  --   --  261*  --  117* 90  BUN 51*  --  63*  --   --  74*  --  81* 60*  CREATININE 5.33*  --  5.95*  --   --  6.18*  --  6.45* 5.43*  CALCIUM 8.4*  --  8.4*  --   --  8.2*  --  8.1* 7.7*   < > = values in this interval not displayed.   GFR: Estimated Creatinine Clearance: 20.1 mL/min (A) (by C-G formula based on SCr of 5.43 mg/dL (H)). Liver Function Tests: Recent Labs  Lab 09/17/19 0448 09/18/19 0946 09/19/19 0607 09/20/19 0859 09/21/19 0512  AST 23 16 20 20 16   ALT 15 9 8 7 7   ALKPHOS 616* 599* 547* 471* 394*  BILITOT 2.1* 2.0* 1.1 1.0 0.8  PROT 6.3* 5.9* 6.0* 5.6* 5.6*  ALBUMIN 1.9* 1.6* 1.7* 1.6* 1.6*  No results for input(s): LIPASE, AMYLASE in the last 168 hours. No results for input(s): AMMONIA in the last 168 hours. Coagulation Profile: Recent Labs  Lab 09/15/19 1830  INR 1.3*   Cardiac Enzymes: No results for  input(s): CKTOTAL, CKMB, CKMBINDEX, TROPONINI in the last 168 hours. BNP (last 3 results) No results for input(s): PROBNP in the last 8760 hours. HbA1C: No results for input(s): HGBA1C in the last 72 hours. CBG: Recent Labs  Lab 09/20/19 2125 09/21/19 0746 09/21/19 0821 09/21/19 0823 09/21/19 0945  GLUCAP 95 57* 59* 59* 112*   Lipid Profile: No results for input(s): CHOL, HDL, LDLCALC, TRIG, CHOLHDL, LDLDIRECT in the last 72 hours. Thyroid Function Tests: No results for input(s): TSH, T4TOTAL, FREET4, T3FREE, THYROIDAB in the last 72 hours. Anemia Panel: No results for input(s): VITAMINB12, FOLATE, FERRITIN, TIBC, IRON, RETICCTPCT in the last 72 hours. Sepsis Labs: No results for input(s): PROCALCITON, LATICACIDVEN in the last 168 hours.  Recent Results (from the past 240 hour(s))  Culture, blood (Routine x 2)     Status: None   Collection Time: 09/13/19 12:58 PM   Specimen: BLOOD  Result Value Ref Range Status   Specimen Description BLOOD SITE NOT SPECIFIED  Final   Special Requests   Final    BOTTLES DRAWN AEROBIC AND ANAEROBIC Blood Culture results may not be optimal due to an excessive volume of blood received in culture bottles   Culture   Final    NO GROWTH 5 DAYS Performed at [facility] [address] [place], [place] [address]    Report Status 09/18/2019 FINAL  Final  Culture, blood (Routine x 2)     Status: None   Collection Time: 09/13/19  1:08 PM   Specimen: BLOOD  Result Value Ref Range Status   Specimen Description BLOOD SITE NOT SPECIFIED  Final   Special Requests   Final    BOTTLES DRAWN AEROBIC AND ANAEROBIC Blood Culture adequate volume   Culture   Final    NO GROWTH 5 DAYS Performed at [facility], [place] [address] [place], [place] [address]    Report Status 09/18/2019 FINAL  Final  SARS Coronavirus 2 by RT PCR (hospital order, performed in [facility] hospital lab) Nasopharyngeal Nasopharyngeal Swab     Status: None   Collection  Time: 09/13/19  2:02 PM   Specimen: Nasopharyngeal Swab  Result Value Ref Range Status   SARS Coronavirus 2 NEGATIVE NEGATIVE Final    Comment: (NOTE) SARS-CoV-2 target nucleic acids are NOT DETECTED.  The SARS-CoV-2 RNA is generally detectable in upper and lower respiratory specimens during the acute phase of infection. The lowest concentration of SARS-CoV-2 viral copies this assay can detect is 250 copies / mL. A negative result does not preclude SARS-CoV-2 infection and should not be used as the sole basis for treatment or other patient management decisions.  A negative result may occur with improper specimen collection / handling, submission of specimen other than nasopharyngeal swab, presence of viral mutation(s) within the areas targeted by this assay, and inadequate number of viral copies (<250 copies / mL). A negative result must be combined with clinical observations, patient history, and epidemiological information.  Fact Sheet for Patients:   StrictlyIdeas.no  Fact Sheet for Healthcare Providers: BankingDealers.co.za  This test is not yet approved or  cleared by the [place] FDA and has been authorized for detection and/or diagnosis of SARS-CoV-2 by FDA under an Emergency Use Authorization (EUA).  This EUA  will remain in effect (meaning this test can be used) for the duration of the COVID-19 declaration under Section 564(b)(1) of the Act, 21 U.S.C. section 360bbb-3(b)(1), unless the authorization is terminated or revoked sooner.  Performed at [facility], [place] [address] [place], [place] [address]   Urine culture     Status: Abnormal   Collection Time: 09/13/19  8:06 PM   Specimen: In/Out Cath Urine  Result Value Ref Range Status   Specimen Description IN/OUT CATH URINE  Final   Special Requests   Final    NONE Performed at [facility], [place] [address] [place], [place] [address]    Culture  MULTIPLE SPECIES PRESENT, SUGGEST RECOLLECTION (A)  Final   Report Status 09/14/2019 FINAL  Final  MRSA PCR Screening     Status: None   Collection Time: 09/15/19  4:55 AM   Specimen: Nasal Mucosa; Nasopharyngeal  Result Value Ref Range Status   MRSA by PCR NEGATIVE NEGATIVE Final    Comment:        The GeneXpert MRSA Assay (FDA approved for NASAL specimens only), is one component of a comprehensive MRSA colonization surveillance program. It is not intended to diagnose MRSA infection nor to guide or monitor treatment for MRSA infections. Performed at [facility], [place] [address], [place], [place] [address]   Aerobic/Anaerobic Culture (surgical/deep wound)     Status: None (Preliminary result)   Collection Time: 09/17/19  4:18 PM   Specimen: Soft Tissue, Other  Result Value Ref Range Status   Specimen Description TISSUE LEFT TOE  Final   Special Requests LEFT GREAT TOE  Final   Gram Stain   Final    MODERATE WBC PRESENT,BOTH PMN AND MONONUCLEAR FEW GRAM POSITIVE COCCI Performed at [facility], [place] [address] [place], [place] [address]    Culture RARE PROTEUS PENNERI  Final   Report Status PENDING  Incomplete   Organism ID, Bacteria PROTEUS PENNERI  Final      Susceptibility   Proteus penneri - MIC*    AMPICILLIN <=2 SENSITIVE Sensitive     CEFAZOLIN <=4 SENSITIVE Sensitive     CEFEPIME <=0.12 SENSITIVE Sensitive     CEFTAZIDIME <=1 SENSITIVE Sensitive     CEFTRIAXONE <=0.25 SENSITIVE Sensitive     CIPROFLOXACIN <=0.25 SENSITIVE Sensitive     GENTAMICIN <=1 SENSITIVE Sensitive     IMIPENEM 2 SENSITIVE Sensitive     TRIMETH/SULFA <=20 SENSITIVE Sensitive     AMPICILLIN/SULBACTAM <=2 SENSITIVE Sensitive     PIP/TAZO <=4 SENSITIVE Sensitive     * RARE PROTEUS PENNERI  Surgical PCR screen     Status: None   Collection Time: 09/17/19 10:21 PM   Specimen: Nasal Mucosa; Nasal Swab  Result Value Ref Range Status   MRSA, PCR NEGATIVE NEGATIVE Final    Staphylococcus aureus NEGATIVE NEGATIVE Final    Comment: (NOTE) The Xpert SA Assay (FDA approved for NASAL specimens in patients 77 years of age and older), is one component of a comprehensive surveillance program. It is not intended to diagnose infection nor to guide or monitor treatment. Performed at [facility], [place] [address], [place], [place] [address]       Radiology Studies: IR Fluoro Guide CV Line Right  Result Date: 09/20/2019 INDICATION: 39 year old female referred for tunneled hemodialysis catheter EXAM: TUNNELED CENTRAL VENOUS HEMODIALYSIS CATHETER PLACEMENT WITH ULTRASOUND AND FLUOROSCOPIC GUIDANCE MEDICATIONS: 2 g Ancef. The antibiotic was given in an appropriate time  interval prior to skin puncture. ANESTHESIA/SEDATION: Moderate (conscious) sedation was employed during this procedure. A total of Versed 1.0 mg and Fentanyl 50 mcg was administered intravenously. Moderate Sedation Time: 15 minutes. The patient's level of consciousness and vital signs were monitored continuously by radiology nursing throughout the procedure under my direct supervision. FLUOROSCOPY TIME:  Fluoroscopy Time: 0 minutes 12 seconds (1 mGy). COMPLICATIONS: None PROCEDURE: Informed written consent was obtained from the patient after a discussion of the risks, benefits, and alternatives to treatment. Questions regarding the procedure were encouraged and answered. The right neck and chest were prepped with chlorhexidine in a sterile fashion, and a sterile drape was applied covering the operative field. Maximum barrier sterile technique with sterile gowns and gloves were used for the procedure. A timeout was performed prior to the initiation of the procedure. Ultrasound survey was performed. Micropuncture kit was utilized to access the right internal jugular vein under direct, real-time ultrasound guidance after the overlying soft tissues were anesthetized with 1% lidocaine with epinephrine. Stab incision was  made with 11 blade scalpel. Microwire was passed centrally. The microwire was then marked to measure appropriate internal catheter length. External tunneled length was estimated. A total tip to cuff length of 19 cm was selected. 035 guidewire was advanced to the level of the IVC. Skin and subcutaneous tissues of chest wall below the clavicle were generously infiltrated with 1% lidocaine for local anesthesia. A small stab incision was made with 11 blade scalpel. The selected hemodialysis catheter was tunneled in a retrograde fashion from the anterior chest wall to the venotomy incision. Serial dilation was performed and then a peel-away sheath was placed. The catheter was then placed through the peel-away sheath with tips ultimately positioned within the superior aspect of the right atrium. Final catheter positioning was confirmed and documented with a spot radiographic image. The catheter aspirates and flushes normally. The catheter was flushed with appropriate volume heparin dwells. The catheter exit site was secured with a 0-Prolene retention suture. Gel-Foam slurry was infused into the soft tissue tract. The venotomy incision was closed Derma bond and sterile dressing. Dressings were applied at the chest wall. Patient tolerated the procedure well and remained hemodynamically stable throughout. No complications were encountered and no significant blood loss encountered. IMPRESSION: Status post right IJ tunneled hemodialysis catheter. Catheter ready for use. Signed, Dulcy Fanny. Dellia Nims, RPVI [facility] Radiology Electronically Signed   By: Corrie Mckusick D.O.   On: 09/20/2019 14:49   IR US Guide Vasc Access Right  Result Date: 09/20/2019 INDICATION: 39 year old female referred for tunneled hemodialysis catheter EXAM: TUNNELED CENTRAL VENOUS HEMODIALYSIS CATHETER PLACEMENT WITH ULTRASOUND AND FLUOROSCOPIC GUIDANCE MEDICATIONS: 2 g Ancef. The antibiotic was given  in an appropriate time interval prior to skin puncture. ANESTHESIA/SEDATION: Moderate (conscious) sedation was employed during this procedure. A total of Versed 1.0 mg and Fentanyl 50 mcg was administered intravenously. Moderate Sedation Time: 15 minutes. The patient's level of consciousness and vital signs were monitored continuously by radiology nursing throughout the procedure under my direct supervision. FLUOROSCOPY TIME:  Fluoroscopy Time: 0 minutes 12 seconds (1 mGy). COMPLICATIONS: None PROCEDURE: Informed written consent was obtained from the patient after a discussion of the risks, benefits, and alternatives to treatment. Questions regarding the procedure were encouraged and answered. The right neck and chest were prepped with chlorhexidine in a sterile fashion, and a sterile drape was applied covering the operative field. Maximum barrier sterile technique with sterile gowns and gloves were used  for the procedure. A timeout was performed prior to the initiation of the procedure. Ultrasound survey was performed. Micropuncture kit was utilized to access the right internal jugular vein under direct, real-time ultrasound guidance after the overlying soft tissues were anesthetized with 1% lidocaine with epinephrine. Stab incision was made with 11 blade scalpel. Microwire was passed centrally. The microwire was then marked to measure appropriate internal catheter length. External tunneled length was estimated. A total tip to cuff length of 19 cm was selected. 035 guidewire was advanced to the level of the IVC. Skin and subcutaneous tissues of chest wall below the clavicle were generously infiltrated with 1% lidocaine for local anesthesia. A small stab incision was made with 11 blade scalpel. The selected hemodialysis catheter was tunneled in a retrograde fashion from the anterior chest wall to the venotomy incision. Serial dilation was performed and then a peel-away sheath was placed. The catheter was then placed  through the peel-away sheath with tips ultimately positioned within the superior aspect of the right atrium. Final catheter positioning was confirmed and documented with a spot radiographic image. The catheter aspirates and flushes normally. The catheter was flushed with appropriate volume heparin dwells. The catheter exit site was secured with a 0-Prolene retention suture. Gel-Foam slurry was infused into the soft tissue tract. The venotomy incision was closed Derma bond and sterile dressing. Dressings were applied at the chest wall. Patient tolerated the procedure well and remained hemodynamically stable throughout. No complications were encountered and no significant blood loss encountered. IMPRESSION: Status post right IJ tunneled hemodialysis catheter. Catheter ready for use. Signed, Dulcy Fanny. Dellia Nims, RPVI [facility] Radiology Electronically Signed   By: Corrie Mckusick D.O.   On: 09/20/2019 14:49      Scheduled Meds: . cephALEXin  500 mg Oral Q12H  . Chlorhexidine Gluconate Cloth  6 each Topical Daily  . Chlorhexidine Gluconate Cloth  6 each Topical Q0600  . cloNIDine  0.1 mg Oral BID  . darbepoetin (ARANESP) injection - NON-DIALYSIS  40 mcg Subcutaneous Once  . docusate sodium  100 mg Oral BID  . feeding supplement  1 Container Oral TID BM  . ferrous sulfate  325 mg Oral BID WC  . insulin aspart  0-15 Units Subcutaneous TID WC  . insulin aspart  0-5 Units Subcutaneous QHS  . insulin aspart  5 Units Subcutaneous TID WC  . insulin glargine  45 Units Subcutaneous QHS  . mupirocin ointment  1 application Nasal BID  . pregabalin  150 mg Oral QHS  . sodium bicarbonate  50 mEq Intravenous Once   Continuous Infusions: . sodium chloride Stopped (09/20/19 1519)  . sodium chloride Stopped (09/19/19 1713)     LOS: 8 days   Time spent: 25 minutes   Domenic Polite, MD [facility] 09/21/2019, 11:54 AM

## 2019-09-22 LAB — COMPREHENSIVE METABOLIC PANEL
ALT: <5 U/L (ref 0–44)
AST: 12 U/L — ABNORMAL LOW (ref 15–41)
Albumin: 1.7 g/dL — ABNORMAL LOW (ref 3.5–5.0)
Alkaline Phosphatase: 377 U/L — ABNORMAL HIGH (ref 38–126)
Anion gap: 11 (ref 5–15)
BUN: 60 mg/dL — ABNORMAL HIGH (ref 6–20)
CO2: 20 mmol/L — ABNORMAL LOW (ref 22–32)
Calcium: 8.2 mg/dL — ABNORMAL LOW (ref 8.9–10.3)
Chloride: 106 mmol/L (ref 98–111)
Creatinine, Ser: 5.29 mg/dL — ABNORMAL HIGH (ref 0.44–1.00)
GFR calc Af Amer: 11 mL/min — ABNORMAL LOW (ref >60)
GFR calc non Af Amer: 9 mL/min — ABNORMAL LOW (ref >60)
Glucose, Bld: 192 mg/dL — ABNORMAL HIGH (ref 70–99)
Potassium: 5 mmol/L (ref 3.5–5.1)
Sodium: 137 mmol/L (ref 135–145)
Total Bilirubin: 0.7 mg/dL (ref 0.3–1.2)
Total Protein: 5.8 g/dL — ABNORMAL LOW (ref 6.5–8.1)

## 2019-09-22 LAB — CBC
HCT: 24.6 % — ABNORMAL LOW (ref 36.0–46.0)
Hemoglobin: 7.6 g/dL — ABNORMAL LOW (ref 12.0–15.0)
MCH: 25.4 pg — ABNORMAL LOW (ref 26.0–34.0)
MCHC: 30.9 g/dL (ref 30.0–36.0)
MCV: 82.3 fL (ref 80.0–100.0)
Platelets: 213 10*3/uL (ref 150–400)
RBC: 2.99 MIL/uL — ABNORMAL LOW (ref 3.87–5.11)
RDW: 20.4 % — ABNORMAL HIGH (ref 11.5–15.5)
WBC: 10.2 10*3/uL (ref 4.0–10.5)
nRBC: 0 % (ref 0.0–0.2)

## 2019-09-22 LAB — GLUCOSE, CAPILLARY
Glucose-Capillary: 240 mg/dL — ABNORMAL HIGH (ref 70–99)
Glucose-Capillary: 133 mg/dL — ABNORMAL HIGH (ref 70–99)
Glucose-Capillary: 54 mg/dL — ABNORMAL LOW (ref 70–99)
Glucose-Capillary: 82 mg/dL (ref 70–99)

## 2019-09-22 MED ORDER — HEPARIN SODIUM (PORCINE) 1000 UNIT/ML IJ SOLN
INTRAMUSCULAR | Status: AC
  Administered 2019-09-22: 3200 [IU]
  Filled 2019-09-22: qty 4

## 2019-09-22 MED ORDER — OXYCODONE-ACETAMINOPHEN 5-325 MG PO TABS
ORAL_TABLET | ORAL | Status: AC
  Administered 2019-09-22: 2 via ORAL
  Filled 2019-09-22: qty 2

## 2019-09-22 MED ORDER — PROSOURCE PLUS PO LIQD
30.0000 mL | Freq: Two times a day (BID) | ORAL | Status: DC
  Administered 2019-09-23 – 2019-09-24 (×3): 30 mL via ORAL
  Filled 2019-09-22 (×3): qty 30

## 2019-09-22 MED ORDER — RENA-VITE PO TABS
1.0000 | ORAL_TABLET | Freq: Every day | ORAL | Status: DC
  Administered 2019-09-22 – 2019-09-23 (×2): 1 via ORAL
  Filled 2019-09-22 (×2): qty 1

## 2019-09-22 MED ORDER — ASCORBIC ACID 500 MG PO TABS
500.0000 mg | ORAL_TABLET | Freq: Two times a day (BID) | ORAL | Status: DC
  Administered 2019-09-22 – 2019-09-24 (×3): 500 mg via ORAL
  Filled 2019-09-22 (×4): qty 1

## 2019-09-22 MED ORDER — NEPRO/CARBSTEADY PO LIQD
237.0000 mL | Freq: Two times a day (BID) | ORAL | Status: DC
  Administered 2019-09-22 – 2019-09-24 (×4): 237 mL via ORAL

## 2019-09-22 NOTE — Progress Notes (Signed)
PT Cancellation Note  Patient Details Name: MARNITA POIRIER MRN: [id number] DOB: [birth year]   Cancelled Treatment:    Reason Eval/Treat Not Completed: Patient at procedure or test/unavailable (Pt off unit for HD procedure will f/u per POC.)   Joshaua Epple J Stann Mainland 09/22/2019, 1:36 PM  Erasmo Leventhal , PTA Acute Rehabilitation Services Pager [phone] Office [phone]

## 2019-09-22 NOTE — Progress Notes (Signed)
Spouse sent pt a bad of snacks which included a 2 Liter regular [employer]. He also bought her [employer]. Pt educated on fluid restriction and diabetic diet. Pt had 275ml of mountain and said her "blood sugar will be fine".

## 2019-09-22 NOTE — Progress Notes (Signed)
Patient ID: Felicia Reynolds, female   DOB: 25-Sep-1980, 39 y.o.   MRN: [id number] Patient is postoperative day 1 revision first ray amputation.  There is a good seal on the wound VAC with 2 checks there is 100 cc in the wound VAC canister.  Patient will need a leg rest for her wheelchair.  Patient will be discharged on the portable Praveena wound VAC pump.

## 2019-09-22 NOTE — Progress Notes (Signed)
OT Cancellation Note  Patient Details Name: KEISHLA OYER MRN: [id number] DOB: [birth year]   Cancelled Treatment:    Reason Eval/Treat Not Completed: Other (comment) (Pt OTF for HD. Will follow up as pt available and appropriate to initiate OT POC.)  Zenovia Jarred, MSOT, OTR/L [facility] Office Number: [phone] Pager: [phone]  Zenovia Jarred 09/22/2019, 1:48 PM

## 2019-09-22 NOTE — Progress Notes (Signed)
Assisted patient with ordering dinner.

## 2019-09-22 NOTE — Progress Notes (Signed)
[facility] Progress Note  Subjective:  Seen at conclusion of dialysis - tolerated well, feeling improved.  Concerned about LLE wound vac outpt.   Review of systems:    She denies shortness of breath   Denies n/v Denies Cp  Has been NPO     Vitals:   09/21/19 1942 09/22/19 0001 09/22/19 0439 09/22/19 0744  BP: (!) 160/69 (!) 173/70 (!) 155/69 (!) 161/74  Pulse: 72 79  71  Resp: 16 16 20 16  Temp: 99 F (37.2 C) 98.9 F (37.2 C) 98 F (36.7 C) 98.9 F (37.2 C)  TempSrc: Oral Oral Oral   SpO2: 96%   95%  Weight:      Height:        Exam: Gen adult female in bed in NAD at rest NCAT Sclera anicteric Clear anteriorly and unlabored  S1S2 no rub Abd soft nt obese habitus GU foley in place Ext 1-2+ edema,  L foot wrapped Neuro is awake and conversant, follows commands Psych normal mood and affect Access: RIJ tunneled HD catheter c/d/i   Home meds:  - clonidine 0.2 bid/ crestor 20/ lasix 20 bid prn/ lisinopril-hctz 20-12.5 qd  - suboxone 8/2 two films qd/ zanaflex 4 tid/ lyrica 200 tid/ klonopin 0.5 bid prn  - insulin aspart 20u tid ac/ lantus 80u hs  - megace 40 tid prn  - prn's/ vitamins/ supplements    UA 9/9 - >50 wbc, 11-20 epi, 0-5 rbc, many bact, >300 prot, turbid   CXR 9/9 - IMPRESSION: Lungs clear. Heart borderline enlarged. No adenopathy.      Assessment/ Plan:  1. AoCKD 3b - b/l creat 1.7 from 10d ago, eGFR 35 ml/min.  AKI in setting of gangrenous foot vs possible infectious GN with chronic (> 2mos) and severe foot infection; also with volume overload.  Note had rec'd 3.5 gm IV vanc, unlikely to cause AKI. DC'd IV vanc.  +proteinuria > 300.  UPC ratio with 3150 mg/g - may be secondary to DM as well.  RIJ tunneled HD catheter on 9/16 with IR and first HD on 9/16.    1. HD today then assess needs daily; may be able to hold on further tx. Daily AM labs 2. Continue foley but trial of voiding soon   2. Hyperkalemia - K stable since HD  started.  Watch with daily labs.  3. 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 IDDM - per primary team.  With proteinuria.   5. Bipolar d/o - per primary team  6. HTN - home lisinopril/ hctz are on hold appropriately.  Restarted home clonidine at reduced dose.  May need additional agent. 7. Metabolic acidosis - s/p bicarb and now managing with HD   8. Normocytic anemia - no acute indication for PRBC's.  On PO iron.  aranesp 40 mcg once on 9/17   Recent Labs  Lab 09/20/19 0859 09/20/19 0859 09/21/19 0512 09/22/19 0410  K 5.9*   < > 4.8 5.0  BUN 81*   < > 60* 60*  CREATININE 6.45*   < > 5.43* 5.29*  CALCIUM 8.1*   < > 7.7* 8.2*  HGB 8.0*  --   --  7.6*   < > = values in this interval not displayed.   Inpatient medications: . Chlorhexidine Gluconate Cloth  6 each Topical Daily  . Chlorhexidine Gluconate Cloth  6 each Topical Q0600  . Chlorhexidine Gluconate Cloth  6 each Topical   Q0600  . cloNIDine  0.1 mg Oral BID  . docusate sodium  100 mg Oral BID  . feeding supplement  1 Container Oral TID BM  . ferrous sulfate  325 mg Oral BID WC  . heparin injection (subcutaneous)  5,000 Units Subcutaneous Q8H  . insulin aspart  0-15 Units Subcutaneous TID WC  . insulin aspart  0-5 Units Subcutaneous QHS  . insulin aspart  3 Units Subcutaneous TID WC  . insulin glargine  35 Units Subcutaneous QHS  . pregabalin  75 mg Oral QHS  . sodium bicarbonate  50 mEq Intravenous Once   . sodium chloride Stopped (09/20/19 1519)   acetaminophen **OR** acetaminophen, albuterol, clonazePAM, hydrALAZINE, HYDROmorphone (DILAUDID) injection, ondansetron **OR** ondansetron (ZOFRAN) IV, oxyCODONE-acetaminophen, pantoprazole    Lindsay A Kruska, MD 09/22/2019 10:01 AM    

## 2019-09-22 NOTE — Progress Notes (Signed)
Initial Nutrition Assessment  DOCUMENTATION CODES:   Morbid obesity  INTERVENTION:  D/c Boost Breeze  Nepro Shake po BID, each supplement provides 425 kcal and 19 grams protein  Prosource Plus 30 ml po BID, each supplement provides 100 kcal and 15 grams protein  Renavit daily  Vitamin C 500 mg po BID to support wound healing  NUTRITION DIAGNOSIS:   Inadequate oral intake related to acute illness (severe sepsis due to osteomyelitis s/p left first/second to amputation with wound vac; worsening renal function requiring HD s/p RIJ catheter) as evidenced by energy intake < or equal to 50% for > or equal to 5 days.    GOAL:   Patient will meet greater than or equal to 90% of their needs    MONITOR:   PO intake, Supplement acceptance, Labs, I & O's, Weight trends, Skin  REASON FOR ASSESSMENT:   Malnutrition Screening Tool    ASSESSMENT:  RD working remotely.   39 year old female with history of IDDM, diabetic neuropathy, HTN, CKD stage II, chronic iron deficiency anemia secondary to menorrhagia, suboxone therapy presented with worsening left toe/foot infection s/p stepping on a piece of glass admitted with sepsis and osteomyelitis.  9/9 admit 9/13 excisional debridement; Left great toe amp with wound vac 9/15 completion of left foot first ray amp and second ray amp 9/16 RIJ catheter; HD 9/18 HD  Worsening renal function in setting of vancomycin and sepsis as well as volume overload (+20 lbs since admit) requiring tunneled HD catheter placement for HD  Per chart, usual wt 107.7 -110.2 kg over the past 2 years. Will use wt on 9/01 (110.2 kg) for estimating needs given fluid status.   Meal intake improving, consumed 100% x 2 meals on 9/17, however she has had very poor po during admission.  9/10  75% x 1 intake,  9/13 0% po x 2 intakes  9/14 NPO 9/15 0% po x 2 intakes  9/16 NPO  She is at risk for malnutrition given poor intake and increaed needs r/t HD and wound  healing. Will d/c Boost Breeze and order Nepro Shake BID and Prosource Plus to aid with meeting needs as well Vit C to support wound healing.  I/Os: +2955 ml since admit UOP: 1450 ml x 24 hrs Medications reviewed and include: Colace, Boost Breeze, Ferrous sulfate, SSI, Lantus 35 units daily, Lyrica  Labs: CBGs 133,240,246,175, BUN 60 (H), Cr 5.29 (H), Hgb 7.6 (L), HCT 24.6 (L)   NUTRITION - FOCUSED PHYSICAL EXAM: Unable to complete at this time, RD working remotely.    Diet Order:   Diet Order            Diet renal/carb modified with fluid restriction Diet-HS Snack? Nothing; Fluid restriction: 1200 mL Fluid; Room service appropriate? Yes; Fluid consistency: Thin  Diet effective now                 EDUCATION NEEDS:   Not appropriate for education at this time  Skin:  Skin Assessment: Skin Integrity Issues: Skin Integrity Issues:: Incisions Incisions: closed; L foot  Last BM:  9/17  Height:   Ht Readings from Last 1 Encounters:  09/13/19 5' 6.5" (1.689 m)    Weight:   Wt Readings from Last 1 Encounters:  09/21/19 (!) 142.4 kg    Ideal Body Weight:  60.2 kg  BMI:  Body mass index is 49.91 kg/m.  Estimated Nutritional Needs:   Kcal:  2300-2500  Protein:  115-130  Fluid:  1.2 L/day  per MD   Lajuan Lines, RD, LDN Clinical Nutrition After Hours/Weekend Pager # in [place]

## 2019-09-22 NOTE — Progress Notes (Signed)
PROGRESS NOTE    Felicia Reynolds  TMH:[id number] DOB: 1980-04-19 DOA: 09/13/2019 PCP: [facility]     Brief Narrative:  Felicia Reynolds is a 39/F w IDDM, diabetic neuropathy, hypertension, CKD stage IIIb, chronic iron deficiency anemia, Suboxone therapy, presented with worsening of left toe/foot infection.  10 days ago, patient stepped on piece of mirror which cut open her left toe and distal left foot was seen by her PCP who prescribed Augmentin, subsequently developed worsening pain and swelling with purulent foul-smelling discharge, associated fevers and chills. - In the ED, she was hypotensive, tachycardic febrile to 103, worsening of creatinine level to 2.6. -  Left foot x-ray showed soft tissue air tracking, soft tissue destruction and osteomyelitis,  orthopedic surgery was consulted.  Patient was started on IV Zosyn and vancomycin.  -Her kidney function continued to gradually worsen nephrology was consulted.  Vancomycin was stopped.   -Status post excisional debridement, left foot first ray amputation by Dr. Erlinda Hong 9/13.  -Back to the OR, underwent completion left foot first ray amputation and second ray amputation 9/15 -9/16 underwent  tunneled HD catheter placement and first HD  Subjective: -Asking if she can go home, has very little insight into her kidney problems  Assessment & Plan:  Severe sepsis/osteomyelitis of the left great toe, present on admission -Presented with fever 103.2, tachycardia, leukocytosis, AKI -Foot x-ray revealed soft tissue air tracking along the first digit extending from the first distal phalanx to the proximal first metatarsal consistent with soft tissue infection and bony destruction along the distal aspect of the first proximal phalanx. -Blood cultures negative to date -Vancomycin discontinued in the setting of renal failure, had been on IV Zosyn which was changed 9/16 to Ancef and then finally discontinued 9/17 per Ortho suggestions -Status  post excisional debridement, left foot first ray amputation by Dr. Erlinda Hong 9/13.   -Status post left foot second ray amputation, local tissue rearrangement and wound VAC placement in OR 9/15 - continue wound VAC, minimal weightbearing on the left heel only as needed -PT following, CIR evaluating  AKI on CKD stage IIIb -Baseline creatinine 1.7 -Creatinine worsened in the setting of vancomycin and sepsis and trended up to 6 -Renal ultrasound showed decreased corticomedullary differentiation, no hydronephrosis -Nephrology following, underwent tunneled HD catheter placement on 9/16 followed by first hemodialysis 9/16 -Appears to be recovering -Urine output of 1450 mL yesterday, hopeful for renal recovery -BMP in a.m.  Acute on chronic iron deficiency anemia, chronic blood loss from menorrhagia, Intra-Op blood loss -Transfused total 3 unit packed red blood cell  -Anemia panel noted iron deficiency -Will give IV iron  Uncontrolled diabetes with hyperglycemia and neuropathy -Hemoglobin A1c 9.0 -on Lantus, mealtime NovoLog, sliding scale insulin -Had decreased Lantus dose yesterday due to hypoglycemia CBGs is slightly higher today, will monitor  Abnormal liver on ultrasound -LFTs normal, patient denies any history of alcohol abuse.  She denies any right upper quadrant abdominal pain -Alk phosphatase is elevated, GGT elevated  -Hepatitis panel negative -Will need further outpatient work up once acute illness resolves  -Could have fatty liver disease as well  Morbid obesity Estimated body mass index is 49.91 kg/m as calculated from the following:   Height as of this encounter: 5' 6.5" (1.689 m).   Weight as of this encounter: 142.4 kg.   Anxiety/depression Chronic pain -Remains on Klonopin, stopped hydroxyzine -Also cut down Lyrica dose -Also remains on oxycodone, dose decreased from home regimen   DVT prophylaxis:  heparin subcu  Code Status: Full code Family Communication: No family  at bedside Disposition Plan:   Status is: Inpatient  Remains inpatient appropriate because:Ongoing diagnostic testing needed not appropriate for outpatient work up, IV treatments appropriate due to intensity of illness or inability to take PO and Inpatient level of care appropriate due to severity of illness   Dispo: The patient is from: Home              Anticipated d/c is to: Possibly CIR              Anticipated d/c date is: > 3 days              Patient currently is not medically stable to d/c.  Osteomyelitis and severe renal failure   Consultants:   Orthopedic surgery  Nephrology  Procedures:  9/13:  PROCEDURE:  1.  Excisional debridement of left foot skin, subcutaneous tissue, muscle, tendon, bone 85 cm 2.  Left foot first ray amputation 3.  Application of wound VAC greater than 50 cm  9/15: First ray left foot amputation was completed and a second ray amputation followed by local tissue rearrangement and wound VAC application Dr. Sharol Given  Antimicrobials:  Anti-infectives (From admission, onward)   Start     Dose/Rate Route Frequency Ordered Stop   09/20/19 1800  cephALEXin (KEFLEX) capsule 500 mg  Status:  Discontinued        500 mg Oral Every 12 hours 09/20/19 1420 09/21/19 1220   09/20/19 1352  ceFAZolin (ANCEF) 2-4 GM/100ML-% IVPB       Note to Pharmacy: Yolanda Manges   : cabinet override      09/20/19 1352 09/21/19 0159   09/20/19 1345  ceFAZolin (ANCEF) IVPB 2g/100 mL premix        2 g 200 mL/hr over 30 Minutes Intravenous  Once 09/20/19 1257 09/20/19 1440   09/19/19 1000  ceFAZolin (ANCEF) 3 g in dextrose 5 % 50 mL IVPB  Status:  Discontinued        3 g 100 mL/hr over 30 Minutes Intravenous To Short Stay 09/19/19 0724 09/19/19 1416   09/18/19 0600  ceFAZolin (ANCEF) 3 g in dextrose 5 % 50 mL IVPB        3 g 100 mL/hr over 30 Minutes Intravenous On call to O.R. 09/17/19 1421 09/17/19 1528   09/17/19 1800  piperacillin-tazobactam (ZOSYN) IVPB 3.375 g  Status:   Discontinued        3.375 g 12.5 mL/hr over 240 Minutes Intravenous Every 12 hours 09/17/19 1206 09/20/19 1420   09/14/19 1300  vancomycin (VANCOREADY) IVPB 1250 mg/250 mL  Status:  Discontinued        1,250 mg 166.7 mL/hr over 90 Minutes Intravenous Every 24 hours 09/13/19 1215 09/15/19 1130   09/13/19 2200  piperacillin-tazobactam (ZOSYN) IVPB 3.375 g  Status:  Discontinued        3.375 g 12.5 mL/hr over 240 Minutes Intravenous Every 8 hours 09/13/19 1215 09/13/19 1519   09/13/19 1730  piperacillin-tazobactam (ZOSYN) IVPB 3.375 g  Status:  Discontinued        3.375 g 12.5 mL/hr over 240 Minutes Intravenous Every 8 hours 09/13/19 1519 09/17/19 1206   09/13/19 1230  vancomycin (VANCOREADY) IVPB 2000 mg/400 mL        2,000 mg 200 mL/hr over 120 Minutes Intravenous  Once 09/13/19 1215 09/13/19 1635   09/13/19 1230  piperacillin-tazobactam (ZOSYN) IVPB 3.375 g        3.375 g 100 mL/hr  over 30 Minutes Intravenous  Once 09/13/19 1215 09/13/19 1350   09/13/19 1215  vancomycin (VANCOCIN) IVPB 1000 mg/200 mL premix  Status:  Discontinued        1,000 mg 200 mL/hr over 60 Minutes Intravenous  Once 09/13/19 1208 09/13/19 1215       Objective: Vitals:   09/21/19 1942 09/22/19 0001 09/22/19 0439 09/22/19 0744  BP: (!) 160/69 (!) 173/70 (!) 155/69 (!) 161/74  Pulse: 72 79  71  Resp: 16 16 20 16   Temp: 99 F (37.2 C) 98.9 F (37.2 C) 98 F (36.7 C) 98.9 F (37.2 C)  TempSrc: Oral Oral Oral   SpO2: 96%   95%  Weight:      Height:        Intake/Output Summary (Last 24 hours) at 09/22/2019 1138 Last data filed at 09/22/2019 0444 Gross per 24 hour  Intake 745 ml  Output 1450 ml  Net -705 ml   Filed Weights   09/17/19 0500 09/21/19 0500  Weight: (!) 142.3 kg (!) 142.4 kg    Examination:  General exam: Obese female flat affect, laying in bed, eyes closed, easily arousable, and interactive, oriented x3, no distress HEENT: Neck supple, no JVD, right IJ HD catheter noted  CVS: S1-S2,  regular rate rhythm Lungs: Diminished breath sounds bilaterally  Abdomen: Obese, soft, nontender, bowel sounds present Extremities: Left foot with toe amputations, dressing and wound VAC Psychiatry: flat affect  Data Reviewed: I have personally reviewed following labs and imaging studies  CBC: Recent Labs  Lab 09/17/19 0448 09/18/19 0946 09/19/19 0607 09/20/19 0859 09/22/19 0410  WBC 8.5 16.4* 14.4* 10.3 10.2  HGB 8.4* 9.6* 9.7* 8.0* 7.6*  HCT 26.5* 30.7* 30.1* 25.0* 24.6*  MCV 82.3 81.6 80.7 81.2 82.3  PLT 186 268 303 259 048   Basic Metabolic Panel: Recent Labs  Lab 09/18/19 0946 09/18/19 1612 09/19/19 0607 09/19/19 1541 09/20/19 0859 09/21/19 0512 09/22/19 0410  NA 133*  --  133*  --  137 135 137  K 5.7*   < > 6.0* 5.5* 5.9* 4.8 5.0  CL 101  --  102  --  104 106 106  CO2 16*  --  15*  --  17* 23 20*  GLUCOSE 148*  --  261*  --  117* 90 192*  BUN 63*  --  74*  --  81* 60* 60*  CREATININE 5.95*  --  6.18*  --  6.45* 5.43* 5.29*  CALCIUM 8.4*  --  8.2*  --  8.1* 7.7* 8.2*   < > = values in this interval not displayed.   GFR: Estimated Creatinine Clearance: 20.6 mL/min (A) (by C-G formula based on SCr of 5.29 mg/dL (H)). Liver Function Tests: Recent Labs  Lab 09/18/19 0946 09/19/19 0607 09/20/19 0859 09/21/19 0512 09/22/19 0410  AST 16 20 20 16  12*  ALT 9 8 7 7  <5  ALKPHOS 599* 547* 471* 394* 377*  BILITOT 2.0* 1.1 1.0 0.8 0.7  PROT 5.9* 6.0* 5.6* 5.6* 5.8*  ALBUMIN 1.6* 1.7* 1.6* 1.6* 1.7*   No results for input(s): LIPASE, AMYLASE in the last 168 hours. No results for input(s): AMMONIA in the last 168 hours. Coagulation Profile: Recent Labs  Lab 09/15/19 1830  INR 1.3*   Cardiac Enzymes: No results for input(s): CKTOTAL, CKMB, CKMBINDEX, TROPONINI in the last 168 hours. BNP (last 3 results) No results for input(s): PROBNP in the last 8760 hours. HbA1C: No results for input(s): HGBA1C in the last 72  hours. CBG: Recent Labs  Lab 09/21/19 0945  09/21/19 1158 09/21/19 1714 09/21/19 2158 09/22/19 0746  GLUCAP 112* 131* 175* 246* 240*   Lipid Profile: No results for input(s): CHOL, HDL, LDLCALC, TRIG, CHOLHDL, LDLDIRECT in the last 72 hours. Thyroid Function Tests: No results for input(s): TSH, T4TOTAL, FREET4, T3FREE, THYROIDAB in the last 72 hours. Anemia Panel: No results for input(s): VITAMINB12, FOLATE, FERRITIN, TIBC, IRON, RETICCTPCT in the last 72 hours. Sepsis Labs: No results for input(s): PROCALCITON, LATICACIDVEN in the last 168 hours.  Recent Results (from the past 240 hour(s))  Culture, blood (Routine x 2)     Status: None   Collection Time: 09/13/19 12:58 PM   Specimen: BLOOD  Result Value Ref Range Status   Specimen Description BLOOD SITE NOT SPECIFIED  Final   Special Requests   Final    BOTTLES DRAWN AEROBIC AND ANAEROBIC Blood Culture results may not be optimal due to an excessive volume of blood received in culture bottles   Culture   Final    NO GROWTH 5 DAYS Performed at [facility] [address], [place], [place] [address]    Report Status 09/18/2019 FINAL  Final  Culture, blood (Routine x 2)     Status: None   Collection Time: 09/13/19  1:08 PM   Specimen: BLOOD  Result Value Ref Range Status   Specimen Description BLOOD SITE NOT SPECIFIED  Final   Special Requests   Final    BOTTLES DRAWN AEROBIC AND ANAEROBIC Blood Culture adequate volume   Culture   Final    NO GROWTH 5 DAYS Performed at [facility], [place] [address] [place], [place] [address]    Report Status 09/18/2019 FINAL  Final  SARS Coronavirus 2 by RT PCR (hospital order, performed in [facility] hospital lab) Nasopharyngeal Nasopharyngeal Swab     Status: None   Collection Time: 09/13/19  2:02 PM   Specimen: Nasopharyngeal Swab  Result Value Ref Range Status   SARS Coronavirus 2 NEGATIVE NEGATIVE Final    Comment: (NOTE) SARS-CoV-2 target nucleic acids are NOT DETECTED.  The SARS-CoV-2 RNA is generally  detectable in upper and lower respiratory specimens during the acute phase of infection. The lowest concentration of SARS-CoV-2 viral copies this assay can detect is 250 copies / mL. A negative result does not preclude SARS-CoV-2 infection and should not be used as the sole basis for treatment or other patient management decisions.  A negative result may occur with improper specimen collection / handling, submission of specimen other than nasopharyngeal swab, presence of viral mutation(s) within the areas targeted by this assay, and inadequate number of viral copies (<250 copies / mL). A negative result must be combined with clinical observations, patient history, and epidemiological information.  Fact Sheet for Patients:   StrictlyIdeas.no  Fact Sheet for Healthcare Providers: BankingDealers.co.za  This test is not yet approved or  cleared by the [place] FDA and has been authorized for detection and/or diagnosis of SARS-CoV-2 by FDA under an Emergency Use Authorization (EUA).  This EUA will remain in effect (meaning this test can be used) for the duration of the COVID-19 declaration under Section 564(b)(1) of the Act, 21 U.S.C. section 360bbb-3(b)(1), unless the authorization is terminated or revoked sooner.  Performed at [facility], [place] [address], [place], [place] [address]   Urine culture     Status: Abnormal   Collection Time: 09/13/19  8:06 PM   Specimen: In/Out Cath Urine  Result Value Ref Range Status   Specimen Description IN/OUT CATH URINE  Final   Special Requests   Final    NONE Performed at [facility], [place] [address] [place], [place] [address]    Culture MULTIPLE SPECIES PRESENT, SUGGEST RECOLLECTION (A)  Final   Report Status 09/14/2019 FINAL  Final  MRSA PCR Screening     Status: None   Collection Time: 09/15/19  4:55 AM   Specimen: Nasal Mucosa; Nasopharyngeal  Result Value Ref Range Status    MRSA by PCR NEGATIVE NEGATIVE Final    Comment:        The GeneXpert MRSA Assay (FDA approved for NASAL specimens only), is one component of a comprehensive MRSA colonization surveillance program. It is not intended to diagnose MRSA infection nor to guide or monitor treatment for MRSA infections. Performed at [facility], [place] [address] [place], [place] [address]   Aerobic/Anaerobic Culture (surgical/deep wound)     Status: None (Preliminary result)   Collection Time: 09/17/19  4:18 PM   Specimen: Soft Tissue, Other  Result Value Ref Range Status   Specimen Description TISSUE LEFT TOE  Final   Special Requests LEFT GREAT TOE  Final   Gram Stain   Final    MODERATE WBC PRESENT,BOTH PMN AND MONONUCLEAR FEW GRAM POSITIVE COCCI    Culture   Final    RARE PROTEUS PENNERI CULTURE REINCUBATED FOR BETTER GROWTH Performed at [facility], [place] [address], [place], [place] [address]    Report Status PENDING  Incomplete   Organism ID, Bacteria PROTEUS PENNERI  Final      Susceptibility   Proteus penneri - MIC*    AMPICILLIN <=2 SENSITIVE Sensitive     CEFAZOLIN <=4 SENSITIVE Sensitive     CEFEPIME <=0.12 SENSITIVE Sensitive     CEFTAZIDIME <=1 SENSITIVE Sensitive     CEFTRIAXONE <=0.25 SENSITIVE Sensitive     CIPROFLOXACIN <=0.25 SENSITIVE Sensitive     GENTAMICIN <=1 SENSITIVE Sensitive     IMIPENEM 2 SENSITIVE Sensitive     TRIMETH/SULFA <=20 SENSITIVE Sensitive     AMPICILLIN/SULBACTAM <=2 SENSITIVE Sensitive     PIP/TAZO <=4 SENSITIVE Sensitive     * RARE PROTEUS PENNERI  Surgical PCR screen     Status: None   Collection Time: 09/17/19 10:21 PM   Specimen: Nasal Mucosa; Nasal Swab  Result Value Ref Range Status   MRSA, PCR NEGATIVE NEGATIVE Final   Staphylococcus aureus NEGATIVE NEGATIVE Final    Comment: (NOTE) The Xpert SA Assay (FDA approved for NASAL specimens in patients 6 years of age and older), is one component of a comprehensive surveillance  program. It is not intended to diagnose infection nor to guide or monitor treatment. Performed at [facility], [place] [address], [place], [place] [address]       Radiology Studies: IR Fluoro Guide CV Line Right  Result Date: 09/20/2019 INDICATION: 39 year old female referred for tunneled hemodialysis catheter EXAM: TUNNELED CENTRAL VENOUS HEMODIALYSIS CATHETER PLACEMENT WITH ULTRASOUND AND FLUOROSCOPIC GUIDANCE MEDICATIONS: 2 g Ancef. The antibiotic was given in an appropriate time interval prior to skin puncture. ANESTHESIA/SEDATION: Moderate (conscious) sedation was employed during this procedure. A total of Versed 1.0 mg and Fentanyl 50 mcg was administered intravenously. Moderate Sedation Time: 15 minutes. The patient's level of consciousness and vital signs were monitored continuously by radiology nursing throughout the procedure under my direct supervision. FLUOROSCOPY TIME:  Fluoroscopy Time: 0 minutes 12 seconds (1 mGy).  COMPLICATIONS: None PROCEDURE: Informed written consent was obtained from the patient after a discussion of the risks, benefits, and alternatives to treatment. Questions regarding the procedure were encouraged and answered. The right neck and chest were prepped with chlorhexidine in a sterile fashion, and a sterile drape was applied covering the operative field. Maximum barrier sterile technique with sterile gowns and gloves were used for the procedure. A timeout was performed prior to the initiation of the procedure. Ultrasound survey was performed. Micropuncture kit was utilized to access the right internal jugular vein under direct, real-time ultrasound guidance after the overlying soft tissues were anesthetized with 1% lidocaine with epinephrine. Stab incision was made with 11 blade scalpel. Microwire was passed centrally. The microwire was then marked to measure appropriate internal catheter length. External tunneled length was estimated. A total tip to cuff length of 19  cm was selected. 035 guidewire was advanced to the level of the IVC. Skin and subcutaneous tissues of chest wall below the clavicle were generously infiltrated with 1% lidocaine for local anesthesia. A small stab incision was made with 11 blade scalpel. The selected hemodialysis catheter was tunneled in a retrograde fashion from the anterior chest wall to the venotomy incision. Serial dilation was performed and then a peel-away sheath was placed. The catheter was then placed through the peel-away sheath with tips ultimately positioned within the superior aspect of the right atrium. Final catheter positioning was confirmed and documented with a spot radiographic image. The catheter aspirates and flushes normally. The catheter was flushed with appropriate volume heparin dwells. The catheter exit site was secured with a 0-Prolene retention suture. Gel-Foam slurry was infused into the soft tissue tract. The venotomy incision was closed Derma bond and sterile dressing. Dressings were applied at the chest wall. Patient tolerated the procedure well and remained hemodynamically stable throughout. No complications were encountered and no significant blood loss encountered. IMPRESSION: Status post right IJ tunneled hemodialysis catheter. Catheter ready for use. Signed, Dulcy Fanny. Dellia Nims, RPVI [facility] Electronically Signed   By: Corrie Mckusick D.O.   On: 09/20/2019 14:49   IR US Guide Vasc Access Right  Result Date: 09/20/2019 INDICATION: 39 year old female referred for tunneled hemodialysis catheter EXAM: TUNNELED CENTRAL VENOUS HEMODIALYSIS CATHETER PLACEMENT WITH ULTRASOUND AND FLUOROSCOPIC GUIDANCE MEDICATIONS: 2 g Ancef. The antibiotic was given in an appropriate time interval prior to skin puncture. ANESTHESIA/SEDATION: Moderate (conscious) sedation was employed during this procedure. A total of Versed 1.0 mg and Fentanyl 50 mcg was administered  intravenously. Moderate Sedation Time: 15 minutes. The patient's level of consciousness and vital signs were monitored continuously by radiology nursing throughout the procedure under my direct supervision. FLUOROSCOPY TIME:  Fluoroscopy Time: 0 minutes 12 seconds (1 mGy). COMPLICATIONS: None PROCEDURE: Informed written consent was obtained from the patient after a discussion of the risks, benefits, and alternatives to treatment. Questions regarding the procedure were encouraged and answered. The right neck and chest were prepped with chlorhexidine in a sterile fashion, and a sterile drape was applied covering the operative field. Maximum barrier sterile technique with sterile gowns and gloves were used for the procedure. A timeout was performed prior to the initiation of the procedure. Ultrasound survey was performed. Micropuncture kit was utilized to access the right internal jugular vein under direct, real-time ultrasound guidance after the overlying soft tissues were anesthetized with 1% lidocaine with epinephrine. Stab incision was made with 11 blade scalpel. Microwire was passed centrally. The microwire was then marked to  measure appropriate internal catheter length. External tunneled length was estimated. A total tip to cuff length of 19 cm was selected. 035 guidewire was advanced to the level of the IVC. Skin and subcutaneous tissues of chest wall below the clavicle were generously infiltrated with 1% lidocaine for local anesthesia. A small stab incision was made with 11 blade scalpel. The selected hemodialysis catheter was tunneled in a retrograde fashion from the anterior chest wall to the venotomy incision. Serial dilation was performed and then a peel-away sheath was placed. The catheter was then placed through the peel-away sheath with tips ultimately positioned within the superior aspect of the right atrium. Final catheter positioning was confirmed and documented with a spot radiographic image. The  catheter aspirates and flushes normally. The catheter was flushed with appropriate volume heparin dwells. The catheter exit site was secured with a 0-Prolene retention suture. Gel-Foam slurry was infused into the soft tissue tract. The venotomy incision was closed Derma bond and sterile dressing. Dressings were applied at the chest wall. Patient tolerated the procedure well and remained hemodynamically stable throughout. No complications were encountered and no significant blood loss encountered. IMPRESSION: Status post right IJ tunneled hemodialysis catheter. Catheter ready for use. Signed, Dulcy Fanny. Dellia Nims, RPVI [facility] Electronically Signed   By: Corrie Mckusick D.O.   On: 09/20/2019 14:49      Scheduled Meds: . Chlorhexidine Gluconate Cloth  6 each Topical Daily  . Chlorhexidine Gluconate Cloth  6 each Topical Q0600  . Chlorhexidine Gluconate Cloth  6 each Topical Q0600  . cloNIDine  0.1 mg Oral BID  . docusate sodium  100 mg Oral BID  . feeding supplement  1 Container Oral TID BM  . ferrous sulfate  325 mg Oral BID WC  . heparin injection (subcutaneous)  5,000 Units Subcutaneous Q8H  . insulin aspart  0-15 Units Subcutaneous TID WC  . insulin aspart  0-5 Units Subcutaneous QHS  . insulin aspart  3 Units Subcutaneous TID WC  . insulin glargine  35 Units Subcutaneous QHS  . pregabalin  75 mg Oral QHS  . sodium bicarbonate  50 mEq Intravenous Once   Continuous Infusions: . sodium chloride Stopped (09/20/19 1519)     LOS: 9 days   Time spent: 25 minutes   Domenic Polite, MD [facility] 09/22/2019, 11:38 AM

## 2019-09-22 NOTE — Progress Notes (Signed)
   09/22/19 1600  Vital Signs  Temp 98.6 F (37 C)  Temp Source Oral  Pulse Rate 76  Pulse Rate Source Monitor  Resp 17  BP (!) 162/86  BP Location Left Arm  BP Method Automatic  Patient Position (if appropriate) Lying  Oxygen Therapy  SpO2 99 %  O2 Device Room Air  During Hemodialysis Assessment  Intra-Hemodialysis Comments Tx completed  Post-Hemodialysis Assessment  Rinseback Volume (mL) 250 mL  KECN 206 V  Dialyzer Clearance Lightly streaked  Duration of HD Treatment -hour(s) 3 hour(s)  Hemodialysis Intake (mL) 500 mL  UF Total -Machine (mL) 2000 mL  Net UF (mL) 1500 mL  Tolerated HD Treatment Yes  Post-Hemodialysis Comments tx complete-pt stable  Hemodialysis Catheter Right Internal jugular Double lumen Permanent (Tunneled)  Placement Date/Time: 09/20/19 1430   Time Out: Correct patient;Correct site;Correct procedure  Maximum sterile barrier precautions: Hand hygiene;Cap;Mask;Large sterile sheet;Sterile gloves;Sterile gown  Site Prep: Chlorhexidine (preferred);Skin Prep C...  Site Condition No complications  Blue Lumen Status Flushed;Capped (Central line);Heparin locked  Red Lumen Status Flushed;Capped (Central line);Heparin locked  Catheter fill solution Heparin 1000 units/ml  Catheter fill volume (Arterial) 1.6 cc  Catheter fill volume (Venous) 1.6  Dressing Type Occlusive  Dressing Status Clean;Intact;Dry;Antimicrobial disc in place  Interventions New dressing;Dressing changed;Antimicrobial disc changed  Drainage Description None  Dressing Change Due 09/29/19  Post treatment catheter status Capped and Clamped

## 2019-09-23 LAB — CBC
HCT: 23.9 % — ABNORMAL LOW (ref 36.0–46.0)
Hemoglobin: 7.5 g/dL — ABNORMAL LOW (ref 12.0–15.0)
MCH: 25.8 pg — ABNORMAL LOW (ref 26.0–34.0)
MCHC: 31.4 g/dL (ref 30.0–36.0)
MCV: 82.1 fL (ref 80.0–100.0)
Platelets: 214 10*3/uL (ref 150–400)
RBC: 2.91 MIL/uL — ABNORMAL LOW (ref 3.87–5.11)
RDW: 20.4 % — ABNORMAL HIGH (ref 11.5–15.5)
WBC: 10.8 10*3/uL — ABNORMAL HIGH (ref 4.0–10.5)
nRBC: 0 % (ref 0.0–0.2)

## 2019-09-23 LAB — BASIC METABOLIC PANEL
Anion gap: 12 (ref 5–15)
BUN: 41 mg/dL — ABNORMAL HIGH (ref 6–20)
CO2: 23 mmol/L (ref 22–32)
Calcium: 8.3 mg/dL — ABNORMAL LOW (ref 8.9–10.3)
Chloride: 102 mmol/L (ref 98–111)
Creatinine, Ser: 4.09 mg/dL — ABNORMAL HIGH (ref 0.44–1.00)
GFR calc Af Amer: 15 mL/min — ABNORMAL LOW (ref >60)
GFR calc non Af Amer: 13 mL/min — ABNORMAL LOW (ref >60)
Glucose, Bld: 108 mg/dL — ABNORMAL HIGH (ref 70–99)
Potassium: 4.7 mmol/L (ref 3.5–5.1)
Sodium: 137 mmol/L (ref 135–145)

## 2019-09-23 LAB — GLUCOSE, CAPILLARY
Glucose-Capillary: 104 mg/dL — ABNORMAL HIGH (ref 70–99)
Glucose-Capillary: 230 mg/dL — ABNORMAL HIGH (ref 70–99)
Glucose-Capillary: 199 mg/dL — ABNORMAL HIGH (ref 70–99)
Glucose-Capillary: 256 mg/dL — ABNORMAL HIGH (ref 70–99)
Glucose-Capillary: 135 mg/dL — ABNORMAL HIGH (ref 70–99)

## 2019-09-23 MED ORDER — NICOTINE 14 MG/24HR TD PT24
14.0000 mg | MEDICATED_PATCH | Freq: Every day | TRANSDERMAL | Status: DC
  Administered 2019-09-23 – 2019-09-24 (×2): 14 mg via TRANSDERMAL
  Filled 2019-09-23 (×2): qty 1

## 2019-09-23 MED ORDER — AMLODIPINE BESYLATE 5 MG PO TABS
5.0000 mg | ORAL_TABLET | Freq: Every day | ORAL | Status: DC
  Administered 2019-09-23 – 2019-09-24 (×2): 5 mg via ORAL
  Filled 2019-09-23 (×2): qty 1

## 2019-09-23 MED ORDER — WHITE PETROLATUM EX OINT
TOPICAL_OINTMENT | CUTANEOUS | Status: DC | PRN
  Filled 2019-09-23: qty 28.35

## 2019-09-23 NOTE — Progress Notes (Signed)
[facility] Progress Note  Subjective:  Foley to be removed at her request. Discussed need for strict I/Os and will check PVRs UOP 1.6L yesterday  Review of systems:    She denies shortness of breath   Denies n/v Denies Cp     Vitals:   09/22/19 2135 09/23/19 0001 09/23/19 0423 09/23/19 0802  BP: (!) 150/68 (!) 153/68 (!) 160/75 (!) 175/70  Pulse: 78 68 66 72  Resp: 18 18 18 16   Temp: 99.1 F (37.3 C) 99.2 F (37.3 C) 98.5 F (36.9 C) 98.3 F (36.8 C)  TempSrc: Oral Oral Oral   SpO2: 94% 91% 95% 96%  Weight:      Height:        Exam: Gen adult female in bed in NAD at rest NCAT Sclera anicteric Clear anteriorly and unlabored  S1S2 no rub Abd soft nt obese habitus GU foley in place Ext 1-2+ edema,  L foot wrapped Neuro is awake and conversant, follows commands Psych normal mood and affect Access: RIJ tunneled HD catheter c/d/i   Home meds:  - clonidine 0.2 bid/ crestor 20/ lasix 20 bid prn/ lisinopril-hctz 20-12.5 qd  - suboxone 8/2 two films qd/ zanaflex 4 tid/ lyrica 200 tid/ klonopin 0.5 bid prn  - insulin aspart 20u tid ac/ lantus 80u hs  - megace 40 tid prn  - prn's/ vitamins/ supplements    UA 9/9 - >50 wbc, 11-20 epi, 0-5 rbc, many bact, >300 prot, turbid   CXR 9/9 - IMPRESSION: Lungs clear. Heart borderline enlarged. No adenopathy.      Assessment/ Plan:  1. AoCKD 3b - b/l creat 1.7 from 10d ago, eGFR 35 ml/min.  AKI in setting of gangrenous foot vs possible infectious GN with chronic (> 90mo) and severe foot infection; also with volume overload.  Note had rec'd 3.5 gm IV vanc, unlikely to cause AKI. DC'd IV vanc.  +proteinuria > 300.  UPC ratio with 3150 mg/g - may be secondary to DM as well.  RIJ tunneled HD catheter on 9/16 with IR and first HD on 9/16.    1. HD last Sat 9/18 then assess needs daily; may be able to hold on further tx. Daily AM labs 2. D/c foley - TOV, needs PVRs, ordered  2. Hyperkalemia - K stable since HD  started.  Watch with daily labs.  3. Gangrene/ osteo L foot - IV abx and s/p amputation now with wound vac 4. IDDM - per primary team.  With proteinuria.   5. Bipolar d/o - per primary team  6. HTN - home lisinopril/ hctz are on hold appropriately.  Restarted home clonidine at reduced dose. Add amlodipine today.  7. Metabolic acidosis - s/p bicarb and now managing with HD   8. Normocytic anemia - s/p 3u pRBC this admission, iron deficient - had rec'd IV iron prior to admission.  Currently on po iron.  Last ESA dosed 9/17.   Recent Labs  Lab 09/22/19 0410 09/23/19 0418  K 5.0 4.7  BUN 60* 41*  CREATININE 5.29* 4.09*  CALCIUM 8.2* 8.3*  HGB 7.6* 7.5*   Inpatient medications:  (feeding supplement) PROSource Plus  30 mL Oral BID BM   vitamin C  500 mg Oral BID   Chlorhexidine Gluconate Cloth  6 each Topical Daily   Chlorhexidine Gluconate Cloth  6 each Topical Q0600   Chlorhexidine Gluconate Cloth  6 each Topical Q0600   cloNIDine  0.1 mg Oral BID   docusate sodium  100 mg Oral BID   feeding supplement (NEPRO CARB STEADY)  237 mL Oral BID BM   ferrous sulfate  325 mg Oral BID WC   heparin injection (subcutaneous)  5,000 Units Subcutaneous Q8H   insulin aspart  0-15 Units Subcutaneous TID WC   insulin aspart  0-5 Units Subcutaneous QHS   insulin aspart  3 Units Subcutaneous TID WC   insulin glargine  35 Units Subcutaneous QHS   multivitamin  1 tablet Oral QHS   pregabalin  75 mg Oral QHS   sodium bicarbonate  50 mEq Intravenous Once    sodium chloride Stopped (09/20/19 1519)   acetaminophen **OR** acetaminophen, albuterol, clonazePAM, hydrALAZINE, HYDROmorphone (DILAUDID) injection, ondansetron **OR** ondansetron (ZOFRAN) IV, oxyCODONE-acetaminophen, pantoprazole    Justin Mend, MD 09/23/2019 11:39 AM

## 2019-09-23 NOTE — Progress Notes (Signed)
Spoke with Dr. Broadus John and she is ok with the patient leaving her tele monitor off because the patient is refusing to wear it.

## 2019-09-23 NOTE — Progress Notes (Addendum)
PROGRESS NOTE    [name]  PXT:[id number] DOB: 10-Oct-1980 DOA: 09/13/2019 PCP: [facility]     Brief Narrative:  Felicia Reynolds is a 39/F w IDDM, diabetic neuropathy, hypertension, CKD stage IIIb, chronic iron deficiency anemia, chronic pain syndrome/narcotic dependence, Suboxone therapy, presented with worsening of left toe/foot infection.  10 days ago, patient stepped on piece of mirror which cut open her left toe and distal left foot was seen by her PCP who prescribed Augmentin, subsequently developed worsening pain and swelling with purulent foul-smelling discharge, associated fevers and chills. - In the ED, she was hypotensive, tachycardic febrile to 103, worsening of creatinine level to 2.6. -  Left foot x-ray showed soft tissue air tracking, soft tissue destruction and osteomyelitis,  orthopedic surgery was consulted.  Patient was started on IV Zosyn and vancomycin.  -Her kidney function continued to gradually worsen nephrology was consulted.  Vancomycin was stopped.   -Status post excisional debridement, left foot first ray amputation by Dr. Erlinda Hong 9/13.  -Back to the OR, underwent completion left foot first ray amputation and second ray amputation 9/15 -9/16 underwent  tunneled HD catheter placement and first HD  Subjective: -Wants to go home and have her catheter removed  Assessment & Plan:  Severe sepsis/osteomyelitis of the left great toe, present on admission -Presented with fever 103.2, tachycardia, leukocytosis, AKI -Foot x-ray revealed soft tissue air tracking along the first digit extending from the first distal phalanx to the proximal first metatarsal consistent with soft tissue infection and bony destruction along the distal aspect of the first proximal phalanx. -Blood cultures negative, Vancomycin discontinued in the setting of renal failure, had been on IV Zosyn which was changed 9/16 to Ancef and then finally discontinued 9/17 per Ortho suggestion -Status  post excisional debridement, left foot first ray amputation by Dr. Erlinda Hong 9/13.   -Status post left foot second ray amputation, local tissue rearrangement and wound VAC placement in OR 9/15 - continue wound VAC, minimal weightbearing on the left heel only as needed -PT following, CIR evaluating  AKI on CKD stage IIIb -Baseline creatinine 1.7 -Creatinine worsened in the setting of vancomycin and sepsis and trended up to 6 -Renal ultrasound suggestive of medical renal disease, no hydronephrosis -Nephrology following, underwent tunneled HD catheter placement on 9/16 followed by first hemodialysis 9/16, and next on 9/18 -Urine output improving, 1500 mL yesterday, hopeful for renal recovery -BMP in a.m. -DC Foley catheter, importance of strict I's and O's emphasized  Acute on chronic iron deficiency anemia, chronic blood loss from menorrhagia, Intra-Op blood loss -Transfused total 3 unit packed red blood cell  -Anemia panel noted iron deficiency -Given IV iron last week  Uncontrolled diabetes with hyperglycemia and neuropathy -Hemoglobin A1c 9.0 -on Lantus, mealtime NovoLog, sliding scale insulin -Had decreased Lantus dose yesterday due to hypoglycemia CBGs is slightly higher today, will monitor  Abnormal liver on ultrasound -LFTs normal, patient denies any history of alcohol abuse.  She denies any right upper quadrant abdominal pain -Alk phosphatase is elevated, GGT elevated  -Hepatitis panel negative -Will need further outpatient work up once acute illness resolves  -Could have fatty liver disease as well  Morbid obesity Estimated body mass index is 49.91 kg/m as calculated from the following:   Height as of this encounter: 5' 6.5" (1.689 m).   Weight as of this encounter: 142.4 kg.   Anxiety/depression Chronic pain -Remains on Klonopin, stopped hydroxyzine -Also cut down Lyrica dose -Also remains on oxycodone, dose decreased from  home regimen   DVT prophylaxis:  heparin subcu   Code Status: Full code Family Communication: No family at bedside Disposition Plan:   Status is: Inpatient  Remains inpatient appropriate because:Ongoing diagnostic testing needed not appropriate for outpatient work up, IV treatments appropriate due to intensity of illness or inability to take PO and Inpatient level of care appropriate due to severity of illness   Dispo: The patient is from: Home              Anticipated d/c is to: Possibly CIR              Anticipated d/c date is: > 3 days              Patient currently is not medically stable to d/c.  Osteomyelitis and severe renal failure   Consultants:   Orthopedic surgery  Nephrology  Procedures:  9/13:  PROCEDURE:  1.  Excisional debridement of left foot skin, subcutaneous tissue, muscle, tendon, bone 85 cm 2.  Left foot first ray amputation 3.  Application of wound VAC greater than 50 cm  9/15: First ray left foot amputation was completed and a second ray amputation followed by local tissue rearrangement and wound VAC application Dr. Sharol Given  Antimicrobials:  Anti-infectives (From admission, onward)   Start     Dose/Rate Route Frequency Ordered Stop   09/20/19 1800  cephALEXin (KEFLEX) capsule 500 mg  Status:  Discontinued        500 mg Oral Every 12 hours 09/20/19 1420 09/21/19 1220   09/20/19 1352  ceFAZolin (ANCEF) 2-4 GM/100ML-% IVPB       Note to Pharmacy: Yolanda Manges   : cabinet override      09/20/19 1352 09/21/19 0159   09/20/19 1345  ceFAZolin (ANCEF) IVPB 2g/100 mL premix        2 g 200 mL/hr over 30 Minutes Intravenous  Once 09/20/19 1257 09/20/19 1440   09/19/19 1000  ceFAZolin (ANCEF) 3 g in dextrose 5 % 50 mL IVPB  Status:  Discontinued        3 g 100 mL/hr over 30 Minutes Intravenous To Short Stay 09/19/19 0724 09/19/19 1416   09/18/19 0600  ceFAZolin (ANCEF) 3 g in dextrose 5 % 50 mL IVPB        3 g 100 mL/hr over 30 Minutes Intravenous On call to O.R. 09/17/19 1421 09/17/19 1528   09/17/19  1800  piperacillin-tazobactam (ZOSYN) IVPB 3.375 g  Status:  Discontinued        3.375 g 12.5 mL/hr over 240 Minutes Intravenous Every 12 hours 09/17/19 1206 09/20/19 1420   09/14/19 1300  vancomycin (VANCOREADY) IVPB 1250 mg/250 mL  Status:  Discontinued        1,250 mg 166.7 mL/hr over 90 Minutes Intravenous Every 24 hours 09/13/19 1215 09/15/19 1130   09/13/19 2200  piperacillin-tazobactam (ZOSYN) IVPB 3.375 g  Status:  Discontinued        3.375 g 12.5 mL/hr over 240 Minutes Intravenous Every 8 hours 09/13/19 1215 09/13/19 1519   09/13/19 1730  piperacillin-tazobactam (ZOSYN) IVPB 3.375 g  Status:  Discontinued        3.375 g 12.5 mL/hr over 240 Minutes Intravenous Every 8 hours 09/13/19 1519 09/17/19 1206   09/13/19 1230  vancomycin (VANCOREADY) IVPB 2000 mg/400 mL        2,000 mg 200 mL/hr over 120 Minutes Intravenous  Once 09/13/19 1215 09/13/19 1635   09/13/19 1230  piperacillin-tazobactam (ZOSYN) IVPB 3.375 g  3.375 g 100 mL/hr over 30 Minutes Intravenous  Once 09/13/19 1215 09/13/19 1350   09/13/19 1215  vancomycin (VANCOCIN) IVPB 1000 mg/200 mL premix  Status:  Discontinued        1,000 mg 200 mL/hr over 60 Minutes Intravenous  Once 09/13/19 1208 09/13/19 1215       Objective: Vitals:   09/22/19 2135 09/23/19 0001 09/23/19 0423 09/23/19 0802  BP: (!) 150/68 (!) 153/68 (!) 160/75 (!) 175/70  Pulse: 78 68 66 72  Resp: 18 18 18 16   Temp: 99.1 F (37.3 C) 99.2 F (37.3 C) 98.5 F (36.9 C) 98.3 F (36.8 C)  TempSrc: Oral Oral Oral   SpO2: 94% 91% 95% 96%  Weight:      Height:        Intake/Output Summary (Last 24 hours) at 09/23/2019 1130 Last data filed at 09/23/2019 1109 Gross per 24 hour  Intake 360 ml  Output 3050 ml  Net -2690 ml   Filed Weights   09/17/19 0500 09/21/19 0500  Weight: (!) 142.3 kg (!) 142.4 kg    Examination:  General exam: Obese pleasant female laying in bed, AAOx3, no distress HEENT: Neck supple, no JVD, right IJ HD catheter  noted CVS: S1-S2, regular rate rhythm Lungs: Diminished breath sounds bilaterally Abdomen: Obese, soft, nontender, bowel sounds present  Extremities: Left foot with toe amputations, dressing and wound VAC Psychiatry: flat affect  Data Reviewed: I have personally reviewed following labs and imaging studies  CBC: Recent Labs  Lab 09/18/19 0946 09/19/19 0607 09/20/19 0859 09/22/19 0410 09/23/19 0418  WBC 16.4* 14.4* 10.3 10.2 10.8*  HGB 9.6* 9.7* 8.0* 7.6* 7.5*  HCT 30.7* 30.1* 25.0* 24.6* 23.9*  MCV 81.6 80.7 81.2 82.3 82.1  PLT 268 303 259 213 468   Basic Metabolic Panel: Recent Labs  Lab 09/19/19 0607 09/19/19 0607 09/19/19 1541 09/20/19 0859 09/21/19 0512 09/22/19 0410 09/23/19 0418  NA 133*  --   --  137 135 137 137  K 6.0*   < > 5.5* 5.9* 4.8 5.0 4.7  CL 102  --   --  104 106 106 102  CO2 15*  --   --  17* 23 20* 23  GLUCOSE 261*  --   --  117* 90 192* 108*  BUN 74*  --   --  81* 60* 60* 41*  CREATININE 6.18*  --   --  6.45* 5.43* 5.29* 4.09*  CALCIUM 8.2*  --   --  8.1* 7.7* 8.2* 8.3*   < > = values in this interval not displayed.   GFR: Estimated Creatinine Clearance: 26.7 mL/min (A) (by C-G formula based on SCr of 4.09 mg/dL (H)). Liver Function Tests: Recent Labs  Lab 09/18/19 0946 09/19/19 0607 09/20/19 0859 09/21/19 0512 09/22/19 0410  AST 16 20 20 16  12*  ALT 9 8 7 7  <5  ALKPHOS 599* 547* 471* 394* 377*  BILITOT 2.0* 1.1 1.0 0.8 0.7  PROT 5.9* 6.0* 5.6* 5.6* 5.8*  ALBUMIN 1.6* 1.7* 1.6* 1.6* 1.7*   No results for input(s): LIPASE, AMYLASE in the last 168 hours. No results for input(s): AMMONIA in the last 168 hours. Coagulation Profile: No results for input(s): INR, PROTIME in the last 168 hours. Cardiac Enzymes: No results for input(s): CKTOTAL, CKMB, CKMBINDEX, TROPONINI in the last 168 hours. BNP (last 3 results) No results for input(s): PROBNP in the last 8760 hours. HbA1C: No results for input(s): HGBA1C in the last 72  hours. CBG: Recent Labs  Lab 09/22/19 1144 09/22/19 2138 09/22/19 2220 09/23/19 0419 09/23/19 0757  GLUCAP 133* 54* 82 104* 230*   Lipid Profile: No results for input(s): CHOL, HDL, LDLCALC, TRIG, CHOLHDL, LDLDIRECT in the last 72 hours. Thyroid Function Tests: No results for input(s): TSH, T4TOTAL, FREET4, T3FREE, THYROIDAB in the last 72 hours. Anemia Panel: No results for input(s): VITAMINB12, FOLATE, FERRITIN, TIBC, IRON, RETICCTPCT in the last 72 hours. Sepsis Labs: No results for input(s): PROCALCITON, LATICACIDVEN in the last 168 hours.  Recent Results (from the past 240 hour(s))  Culture, blood (Routine x 2)     Status: None   Collection Time: 09/13/19 12:58 PM   Specimen: BLOOD  Result Value Ref Range Status   Specimen Description BLOOD SITE NOT SPECIFIED  Final   Special Requests   Final    BOTTLES DRAWN AEROBIC AND ANAEROBIC Blood Culture results may not be optimal due to an excessive volume of blood received in culture bottles   Culture   Final    NO GROWTH 5 DAYS Performed at [facility] [address], [place], [place] [address]    Report Status 09/18/2019 FINAL  Final  Culture, blood (Routine x 2)     Status: None   Collection Time: 09/13/19  1:08 PM   Specimen: BLOOD  Result Value Ref Range Status   Specimen Description BLOOD SITE NOT SPECIFIED  Final   Special Requests   Final    BOTTLES DRAWN AEROBIC AND ANAEROBIC Blood Culture adequate volume   Culture   Final    NO GROWTH 5 DAYS Performed at [facility], [place] [address], [place], [place] [address]    Report Status 09/18/2019 FINAL  Final  SARS Coronavirus 2 by RT PCR (hospital order, performed in [facility] hospital lab) Nasopharyngeal Nasopharyngeal Swab     Status: None   Collection Time: 09/13/19  2:02 PM   Specimen: Nasopharyngeal Swab  Result Value Ref Range Status   SARS Coronavirus 2 NEGATIVE NEGATIVE Final    Comment: (NOTE) SARS-CoV-2 target nucleic acids are NOT  DETECTED.  The SARS-CoV-2 RNA is generally detectable in upper and lower respiratory specimens during the acute phase of infection. The lowest concentration of SARS-CoV-2 viral copies this assay can detect is 250 copies / mL. A negative result does not preclude SARS-CoV-2 infection and should not be used as the sole basis for treatment or other patient management decisions.  A negative result may occur with improper specimen collection / handling, submission of specimen other than nasopharyngeal swab, presence of viral mutation(s) within the areas targeted by this assay, and inadequate number of viral copies (<250 copies / mL). A negative result must be combined with clinical observations, patient history, and epidemiological information.  Fact Sheet for Patients:   StrictlyIdeas.no  Fact Sheet for Healthcare Providers: BankingDealers.co.za  This test is not yet approved or  cleared by the [place] FDA and has been authorized for detection and/or diagnosis of SARS-CoV-2 by FDA under an Emergency Use Authorization (EUA).  This EUA will remain in effect (meaning this test can be used) for the duration of the COVID-19 declaration under Section 564(b)(1) of the Act, 21 U.S.C. section 360bbb-3(b)(1), unless the authorization is terminated or revoked sooner.  Performed at [facility], [place] [address], [place], [place] [address]   Urine culture     Status: Abnormal   Collection Time: 09/13/19  8:06 PM   Specimen: In/Out Cath Urine  Result Value Ref Range Status  Specimen Description IN/OUT CATH URINE  Final   Special Requests   Final    NONE Performed at [facility], [place] [address] [place], [place] [address]    Culture MULTIPLE SPECIES PRESENT, SUGGEST RECOLLECTION (A)  Final   Report Status 09/14/2019 FINAL  Final  MRSA PCR Screening     Status: None   Collection Time: 09/15/19  4:55 AM   Specimen: Nasal Mucosa;  Nasopharyngeal  Result Value Ref Range Status   MRSA by PCR NEGATIVE NEGATIVE Final    Comment:        The GeneXpert MRSA Assay (FDA approved for NASAL specimens only), is one component of a comprehensive MRSA colonization surveillance program. It is not intended to diagnose MRSA infection nor to guide or monitor treatment for MRSA infections. Performed at [facility], [place] [address], [place], [place] [address]   Aerobic/Anaerobic Culture (surgical/deep wound)     Status: None (Preliminary result)   Collection Time: 09/17/19  4:18 PM   Specimen: Soft Tissue, Other  Result Value Ref Range Status   Specimen Description TISSUE LEFT TOE  Final   Special Requests LEFT GREAT TOE  Final   Gram Stain   Final    MODERATE WBC PRESENT,BOTH PMN AND MONONUCLEAR FEW GRAM POSITIVE COCCI Performed at [facility], [place] [address] [place], [place] [address]    Culture   Final    RARE PROTEUS PENNERI RARE ENTEROCOCCUS FAECALIS NO ANAEROBES ISOLATED; CULTURE IN PROGRESS FOR 5 DAYS    Report Status PENDING  Incomplete   Organism ID, Bacteria PROTEUS PENNERI  Final   Organism ID, Bacteria ENTEROCOCCUS FAECALIS  Final      Susceptibility   Enterococcus faecalis - MIC*    AMPICILLIN <=2 SENSITIVE Sensitive     VANCOMYCIN 1 SENSITIVE Sensitive     GENTAMICIN SYNERGY SENSITIVE Sensitive     * RARE ENTEROCOCCUS FAECALIS   Proteus penneri - MIC*    AMPICILLIN <=2 SENSITIVE Sensitive     CEFAZOLIN <=4 SENSITIVE Sensitive     CEFEPIME <=0.12 SENSITIVE Sensitive     CEFTAZIDIME <=1 SENSITIVE Sensitive     CEFTRIAXONE <=0.25 SENSITIVE Sensitive     CIPROFLOXACIN <=0.25 SENSITIVE Sensitive     GENTAMICIN <=1 SENSITIVE Sensitive     IMIPENEM 2 SENSITIVE Sensitive     TRIMETH/SULFA <=20 SENSITIVE Sensitive     AMPICILLIN/SULBACTAM <=2 SENSITIVE Sensitive     PIP/TAZO <=4 SENSITIVE Sensitive     * RARE PROTEUS PENNERI  Surgical PCR screen     Status: None   Collection Time: 09/17/19  10:21 PM   Specimen: Nasal Mucosa; Nasal Swab  Result Value Ref Range Status   MRSA, PCR NEGATIVE NEGATIVE Final   Staphylococcus aureus NEGATIVE NEGATIVE Final    Comment: (NOTE) The Xpert SA Assay (FDA approved for NASAL specimens in patients 66 years of age and older), is one component of a comprehensive surveillance program. It is not intended to diagnose infection nor to guide or monitor treatment. Performed at [facility], [place] [address], [place], [place] [address]     Scheduled Meds: . (feeding supplement) PROSource Plus  30 mL Oral BID BM  . vitamin C  500 mg Oral BID  . Chlorhexidine Gluconate Cloth  6 each Topical Daily  . Chlorhexidine Gluconate Cloth  6 each Topical Q0600  . Chlorhexidine Gluconate Cloth  6 each Topical Q0600  . cloNIDine  0.1 mg Oral BID  . docusate sodium  100 mg Oral BID  . feeding supplement (NEPRO CARB STEADY)  237 mL Oral BID BM  . ferrous sulfate  325 mg Oral BID WC  . heparin injection (subcutaneous)  5,000 Units Subcutaneous Q8H  . insulin aspart  0-15 Units Subcutaneous TID WC  . insulin aspart  0-5 Units Subcutaneous QHS  . insulin aspart  3 Units Subcutaneous TID WC  . insulin glargine  35 Units Subcutaneous QHS  . multivitamin  1 tablet Oral QHS  . pregabalin  75 mg Oral QHS  . sodium bicarbonate  50 mEq Intravenous Once   Continuous Infusions: . sodium chloride Stopped (09/20/19 1519)    LOS: 10 days   Time spent: 25 minutes   Domenic Polite, MD [facility] 09/23/2019, 11:30 AM

## 2019-09-23 NOTE — Evaluation (Signed)
Occupational Therapy Evaluation Patient Details Name: Felicia Reynolds MRN: [id number] DOB: 30-Oct-1980 Today's Date: 09/23/2019    History of Present Illness Felicia Reynolds is a 39 y.o. female with medical history significant of IDDM with insulin resistance, diabetic neuropathy, hypertension, CKD stage II, chronic iron deficiency anemia secondary to menorrhagia, Suboxone therapy, presented with worsening of left toe/foot infection. 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  She is awaiting IR procedure for IJ tunneled dialysis access   Clinical Impression   PTA pt living at home with family. She reports needing intermittent assist for ADL/IADL at baseline. At time of eval, pt is very internally distracted by pain and stressors of her current situation. She was able to sit EOB with min A, began c/o too much pain and quickly laid back down. Noted cognitive deficits in attention, problem solving, memory, and safety. Pt giving multiple reasons as to why she could not further participate in OT session. Given current status, recommend CIR for d/c plan to progress ADLs to prior independent level of functional. Will continue to follow per POC listed below.     Follow Up Recommendations  CIR (HHOT if pt refuses)    Equipment Recommendations  3 in 1 bedside commode (bariatric)    Recommendations for Other Services       Precautions / Restrictions Precautions Precautions: Fall Required Braces or Orthoses: Other Brace Other Brace: post op shoe L LE (could not find in room?) Restrictions Weight Bearing Restrictions: Yes LLE Weight Bearing: Touchdown weight bearing      Mobility Bed Mobility Overal bed mobility: Needs Assistance Bed Mobility: Supine to Sit;Sit to Supine     Supine to sit: Min assist Sit to supine: Min assist   General bed mobility comments: increased time and  encouragement needed. once sitting up, pt reporting increased pain in L foot and head- then laying back down  Transfers                      Balance                                           ADL either performed or assessed with clinical judgement   ADL Overall ADL's : Needs assistance/impaired Eating/Feeding: Set up;Sitting   Grooming: Set up;Sitting   Upper Body Bathing: Minimal assistance;Sitting   Lower Body Bathing: Maximal assistance;Sitting/lateral leans;Sit to/from stand   Upper Body Dressing : Minimal assistance;Sitting   Lower Body Dressing: Maximal assistance;Sitting/lateral leans;Sit to/from stand   Toilet Transfer: Moderate assistance;Stand-pivot;BSC   Toileting- Clothing Manipulation and Hygiene: Maximal assistance;Sitting/lateral lean               Vision Patient Visual Report: No change from baseline       Perception     Praxis      Pertinent Vitals/Pain Pain Assessment: Faces Faces Pain Scale: Hurts even more Pain Location: L foot; HA Pain Descriptors / Indicators: Aching;Discomfort;Headache Pain Intervention(s): Limited activity within patient's tolerance;Monitored during session     Hand Dominance     Extremity/Trunk Assessment Upper Extremity Assessment Upper Extremity Assessment: Generalized weakness   Lower Extremity Assessment Lower Extremity Assessment: Defer to PT evaluation       Communication Communication Communication: No difficulties  Cognition Arousal/Alertness: Awake/alert Behavior During Therapy: Anxious (tearful) Overall Cognitive Status: No family/caregiver present to determine baseline cognitive functioning Area of Impairment: Attention;Memory;Safety/judgement;Problem solving                   Current Attention Level: Sustained Memory: Decreased short-term memory;Decreased recall of precautions   Safety/Judgement: Decreased awareness of deficits;Decreased awareness of  safety   Problem Solving: Slow processing;Requires verbal cues General Comments: pt is tearfula nd reports being overwhelmed by current situation and outside psychosocial factors. Requires cues to stay on task. Low health literacy (has 2L of mntn dew soda in room when she is on a fluid restriction). Excuses therapist with most attempts   General Comments       Exercises     Shoulder Instructions      Home Living Family/patient expects to be discharged to:: Private residence Living Arrangements: Children Available Help at Discharge: Family Type of Home: Mobile home Home Access: Stairs to enter Entrance Stairs-Number of Steps: 3 Entrance Stairs-Rails: Right;Left;Can reach both Home Layout: One level     Bathroom Shower/Tub: Teacher, early years/pre: Standard         Additional Comments: children x 4 age 46-18, spouse no longer lives with her, but she states needs to talk with him about getting a ramp for the home      Prior Functioning/Environment Level of Independence: Independent with assistive device(s)        Comments: has w/c she uses intermittently- states she does not work has been on disability        OT Problem List: Decreased strength;Decreased knowledge of use of DME or AE;Decreased knowledge of precautions;Decreased activity tolerance;Impaired balance (sitting and/or standing);Decreased cognition;Pain      OT Treatment/Interventions: Self-care/ADL training;Therapeutic exercise;Patient/family education;Balance training;Energy conservation;Therapeutic activities;DME and/or AE instruction    OT Goals(Current goals can be found in the care plan section) Acute Rehab OT Goals Patient Stated Goal: to go home OT Goal Formulation: With patient Time For Goal Achievement: 10/07/19 Potential to Achieve Goals: Good  OT Frequency: Min 2X/week   Barriers to D/C:            Co-evaluation              AM-PAC OT "6 Clicks" Daily Activity      Outcome Measure Help from another person eating meals?: A Little Help from another person taking care of personal grooming?: A Little Help from another person toileting, which includes using toliet, bedpan, or urinal?: A Lot Help from another person bathing (including washing, rinsing, drying)?: A Lot Help from another person to put on and taking off regular upper body clothing?: A Little Help from another person to put on and taking off regular lower body clothing?: A Lot 6 Click Score: 15   End of Session Equipment Utilized During Treatment: Gait belt;Rolling walker Nurse Communication: Mobility status;Precautions;Weight bearing status  Activity Tolerance: Patient tolerated treatment well Patient left: in bed;with call bell/phone within reach  OT Visit Diagnosis: Unsteadiness on feet (R26.81);Other abnormalities of gait and mobility (R26.89);Muscle weakness (generalized) (M62.81);Other symptoms and signs involving cognitive function;Pain Pain - Right/Left: Left Pain - part of body: Ankle and joints of foot                Time: 4132-4401 OT Time Calculation (min): 23 min Charges:  OT General Charges $OT Visit: 1 Visit OT Evaluation $OT Eval Moderate Complexity: 1 Mod OT Treatments $Self Care/Home Management : 8-22 mins  Zenovia Jarred,  MSOT, OTR/L Acute Rehabilitation Services [facility] Office Number: [phone] Pager: [phone]  Zenovia Jarred 09/23/2019, 12:35 PM

## 2019-09-24 ENCOUNTER — Inpatient Hospital Stay (HOSPITAL_COMMUNITY): Payer: Medicaid Other

## 2019-09-24 HISTORY — PX: IR REMOVAL TUN CV CATH W/O FL: IMG2289

## 2019-09-24 LAB — RENAL FUNCTION PANEL
Albumin: 1.8 g/dL — ABNORMAL LOW (ref 3.5–5.0)
Anion gap: 11 (ref 5–15)
BUN: 40 mg/dL — ABNORMAL HIGH (ref 6–20)
CO2: 24 mmol/L (ref 22–32)
Calcium: 8.4 mg/dL — ABNORMAL LOW (ref 8.9–10.3)
Chloride: 101 mmol/L (ref 98–111)
Creatinine, Ser: 4.19 mg/dL — ABNORMAL HIGH (ref 0.44–1.00)
GFR calc Af Amer: 14 mL/min — ABNORMAL LOW (ref >60)
GFR calc non Af Amer: 12 mL/min — ABNORMAL LOW (ref >60)
Glucose, Bld: 237 mg/dL — ABNORMAL HIGH (ref 70–99)
Phosphorus: 5.5 mg/dL — ABNORMAL HIGH (ref 2.5–4.6)
Potassium: 4.6 mmol/L (ref 3.5–5.1)
Sodium: 136 mmol/L (ref 135–145)

## 2019-09-24 LAB — GLUCOSE, CAPILLARY: Glucose-Capillary: 241 mg/dL — ABNORMAL HIGH (ref 70–99)

## 2019-09-24 LAB — AEROBIC/ANAEROBIC CULTURE W GRAM STAIN (SURGICAL/DEEP WOUND)

## 2019-09-24 MED ORDER — CHLORHEXIDINE GLUCONATE 4 % EX LIQD
CUTANEOUS | Status: AC
  Filled 2019-09-24: qty 15

## 2019-09-24 MED ORDER — LIDOCAINE HCL 1 % IJ SOLN
INTRAMUSCULAR | Status: AC
  Filled 2019-09-24: qty 20

## 2019-09-24 NOTE — Discharge Summary (Signed)
Physician Discharge Summary  [name] GYJ:[id number] DOB: 13-Dec-1980 DOA: 09/13/2019  PCP: [facility]  Admit date: 09/13/2019 Discharge date: 09/24/2019  Recommendations for Outpatient Follow-up:  1. Left AGAINST MEDICAL ADVICE   Discharge Diagnoses:  Severe sepsis AKI on CKD 3B Acute on chronic iron deficiency anemia Uncontrolled type 2 diabetes mellitus with hyperglycemia Morbid obesity BMI of 49.9 Noncompliance   Osteomyelitis (HCC)   Sepsis (HCC)   Osteomyelitis of toe of left foot (HCC)   Skin ulcer of toe of left foot with fat layer exposed ([place]) Abnormal liver ultrasound, suspect fatty liver disease   Filed Weights   09/17/19 0500 09/21/19 0500  Weight: (!) 142.3 kg (!) 142.4 kg    History of present illness:  Felicia Reynolds is a 39/F wIDDM, diabetic neuropathy, hypertension, CKD stage IIIb, chronic iron deficiency anemia, chronic pain syndrome/narcotic dependence, Suboxone therapy, presented with worsening of left toe/foot infection. 10 days ago, patient stepped onpiece of mirror which cut open her left toe and distal left foot was seen by her PCP who prescribed Augmentin, subsequently developed worsening pain and swelling with purulent foul-smelling discharge, associated fevers and chills. - In the ED, she was hypotensive, tachycardic febrile to 103, worsening of creatinine level to 2.6. -Left foot x-ray showed soft tissue air tracking, soft tissue destruction and osteomyelitis,  orthopedic surgery was consulted.  Patient was started on IV Zosyn and vancomycin.   Hospital Course:   Severe sepsis/osteomyelitis of the left great toe, present on admission -Presented to ED with fever 103.2, tachycardia, leukocytosis, AKI -Foot x-ray revealed soft tissue air tracking along the first digit extending from the first distal phalanx to the proximal first metatarsal consistent with soft tissue infection and bony destruction along the distal aspect of the  first proximal phalanx. -Blood cultures negative, Vancomycin discontinued in the setting of renal failure, had been on IV Zosyn which was changed 9/16 to Ancef and then finally discontinued 9/17 per Ortho suggestion -Status post excisional debridement, left foot first ray amputation by Dr. Erlinda Reynolds 9/13.   -Status post left foot second ray amputation, local tissue rearrangement and wound VAC placement in OR 9/15 -Subsequently had wound VAC and recommended minimal weightbearing on the left heel only as needed -Followed by physical therapy this admission, patient was not following any weightbearing instructions -Finally left AMA on 9/20, counseled many times about the dangers especially in the setting of acute kidney injury  AKI on CKD stage IIIb -Baseline creatinine 1.7 -Creatinine worsened in the setting of vancomycin and sepsis and trended up to 6 -Renal ultrasound suggestive of medical renal disease, no hydronephrosis -Nephrology consulted, underwent tunneled HD catheter placement on 9/16 followed by first hemodialysis 9/16, and next on 9/18 -Urine output picked up, kidney function starting to recover a little -However patient left AMA today 9/20, we had to remove her tunneled HD catheter as she was leaving AGAINST MEDICAL ADVICE  Acute on chronic iron deficiency anemia, chronic blood loss from menorrhagia, Intra-Op blood loss -Transfused total 3 unit packed red blood cell  -Anemia panel noted iron deficiency -Reynolds IV iron last week  Uncontrolled diabetes with hyperglycemia and neuropathy -Hemoglobin A1c 9.0 -on Lantus, mealtime NovoLog, sliding scale insulin -Insulin dose titrated this admission  Abnormal liver on ultrasound -LFTs normal, patient denies any history of alcohol abuse -Alk phosphatase is elevated, GGT elevated  -Hepatitis panel negative -Liver ultrasound noted signs of liver disease with nodular hepatic contour -Will need further outpatient work up once acute illness  resolves  -Suspect also has fatty liver disease which may have progressed  Morbid obesity Estimated body mass index is 49.91 kg/m as calculated from the following:   Height as of this encounter: 5' 6.5" (1.689 m).   Weight as of this encounter: 142.4 kg.   Anxiety/depression Chronic pain -Remains on Klonopin, stopped hydroxyzine -Also cut down Lyrica dose -Also remains on oxycodone/home med  Consultants:   Orthopedic surgery  Nephrology  Procedures:  9/13:  PROCEDURE: 1. Excisional debridement of left foot skin, subcutaneous tissue, muscle, tendon, bone 85 cm 2. Left foot first ray amputation 3. Application of wound VAC greaterthan 50 cm  9/15: First ray left foot amputation was completed and a second ray amputation followed by local tissue rearrangement and wound VAC application Dr. Sharol Reynolds  Discharge Exam: Vitals:   09/24/19 0050 09/24/19 0804  BP: (!) 179/79   Pulse: 61   Resp: 16 20  Temp: 98.5 F (36.9 C) 98 F (36.7 C)  SpO2:      General: AAOx3 Cardiovascular: S1S2/RRR Respiratory: decreased BS at bases  Discharge Instructions    Allergies as of 09/24/2019      Reactions   Hydrocodone Itching   Latex Itching, Rash          Allergies  Allergen Reactions  . Hydrocodone Itching  . Latex Itching and Rash    Follow-up Information    Reynolds, Felicia Palmer, [place] In 1 week.   Specialty: Orthopedic Surgery Contact information: [place] [address] [phone]                The results of significant diagnostics from this hospitalization (including imaging, microbiology, ancillary and laboratory) are listed below for reference.    Significant Diagnostic Studies: DG Chest 2 View  Result Date: 09/13/2019 CLINICAL DATA:  Sepsis EXAM: CHEST - 2 VIEW COMPARISON:  November 24, 2018 FINDINGS: Lungs are clear. Heart is borderline enlarged with pulmonary vascularity normal. No adenopathy. No bone lesions. IMPRESSION:  Lungs clear.  Heart borderline enlarged.  No adenopathy. Electronically Signed   By: Lowella Grip III M.D.   On: 09/13/2019 13:54   US RENAL  Result Date: 09/15/2019 CLINICAL DATA:  Acute kidney injury EXAM: RENAL / URINARY TRACT ULTRASOUND COMPLETE COMPARISON:  CT of November 24, 2018 FINDINGS: Right Kidney: Renal measurements: 12.2 x 5.4 x 7.0 cm = volume: 241 mL. No hydronephrosis. Decreased corticomedullary differentiation though assessment is limited by body habitus. Left Kidney: Renal measurements: 12.0 x 6.8 x 6.6 cm = volume: 279 mL. No hydronephrosis. Decreased corticomedullary differentiation suggested again body habitus limiting assessment. Bladder: Urinary bladder decompressed. The subtle findings of echogenicity in the area suggest correlation with Foley catheter which is in place. Other: Signs of disease with nodular hepatic contours and fissural widening. Marked gallbladder wall thickening, seen without signs of distension. No pericholecystic fluid. IMPRESSION: 1. Decreased corticomedullary differentiation, no hydronephrosis. 2. Signs of liver disease with nodular hepatic contour. Correlate with any clinical or laboratory evidence of liver disease. 3. Marked gallbladder wall thickening without distension, a finding that can be seen in the setting of hepatic or cardiac dysfunction. This finding can also be seen in acute hepatitis. Correlate with any RIGHT upper quadrant pain. HIDA scan could be helpful if there is clinical concern for acute gallbladder pathology. Electronically Signed   By: Zetta Bills M.D.   On: 09/15/2019 18:10   IR Fluoro Guide CV Line Right  Result Date: 09/20/2019 INDICATION: 39 year old female referred for tunneled hemodialysis catheter  EXAM: TUNNELED CENTRAL VENOUS HEMODIALYSIS CATHETER PLACEMENT WITH ULTRASOUND AND FLUOROSCOPIC GUIDANCE MEDICATIONS: 2 g Ancef. The antibiotic was Reynolds in an appropriate time interval prior to skin puncture. ANESTHESIA/SEDATION:  Moderate (conscious) sedation was employed during this procedure. A total of Versed 1.0 mg and Fentanyl 50 mcg was administered intravenously. Moderate Sedation Time: 15 minutes. The patient's level of consciousness and vital signs were monitored continuously by radiology nursing throughout the procedure under my direct supervision. FLUOROSCOPY TIME:  Fluoroscopy Time: 0 minutes 12 seconds (1 mGy). COMPLICATIONS: None PROCEDURE: Informed written consent was obtained from the patient after a discussion of the risks, benefits, and alternatives to treatment. Questions regarding the procedure were encouraged and answered. The right neck and chest were prepped with chlorhexidine in a sterile fashion, and a sterile drape was applied covering the operative field. Maximum barrier sterile technique with sterile gowns and gloves were used for the procedure. A timeout was performed prior to the initiation of the procedure. Ultrasound survey was performed. Micropuncture kit was utilized to access the right internal jugular vein under direct, real-time ultrasound guidance after the overlying soft tissues were anesthetized with 1% lidocaine with epinephrine. Stab incision was made with 11 blade scalpel. Microwire was passed centrally. The microwire was then marked to measure appropriate internal catheter length. External tunneled length was estimated. A total tip to cuff length of 19 cm was selected. 035 guidewire was advanced to the level of the IVC. Skin and subcutaneous tissues of chest wall below the clavicle were generously infiltrated with 1% lidocaine for local anesthesia. A small stab incision was made with 11 blade scalpel. The selected hemodialysis catheter was tunneled in a retrograde fashion from the anterior chest wall to the venotomy incision. Serial dilation was performed and then a peel-away sheath was placed. The catheter was then placed through the peel-away sheath with tips ultimately positioned within the  superior aspect of the right atrium. Final catheter positioning was confirmed and documented with a spot radiographic image. The catheter aspirates and flushes normally. The catheter was flushed with appropriate volume heparin dwells. The catheter exit site was secured with a 0-Prolene retention suture. Gel-Foam slurry was infused into the soft tissue tract. The venotomy incision was closed Derma bond and sterile dressing. Dressings were applied at the chest wall. Patient tolerated the procedure well and remained hemodynamically stable throughout. No complications were encountered and no significant blood loss encountered. IMPRESSION: Status post right IJ tunneled hemodialysis catheter. Catheter ready for use. Signed, Dulcy Fanny. Dellia Nims, RPVI [facility] Radiology Electronically Signed   By: Corrie Mckusick D.O.   On: 09/20/2019 14:49   IR Removal Tun Cv Cath W/O FL  Result Date: 09/24/2019 INDICATION: Patient with DM, AKI on CKD s/p tunneled HD catheter placement 09/20/19 in IR now leaving AMA - request to IR for removal of tunneled HD catheter. EXAM: REMOVAL OF TUNNELED HEMODIALYSIS CATHETER MEDICATIONS: None COMPLICATIONS: None immediate. PROCEDURE: Informed written consent was obtained from the patient following an explanation of the procedure, risks, benefits and alternatives to treatment. A time out was performed prior to the initiation of the procedure. Maximal barrier sterile technique was utilized including caps, mask, sterile gowns, sterile gloves, large sterile drape, hand hygiene, and Hibiclens. Utilizing gentle traction, the catheter was removed intact. Hemostasis was obtained with manual compression. A dressing was placed. The patient tolerated the procedure well without immediate post procedural complication. IMPRESSION: Successful removal of tunneled dialysis catheter. Read by Candiss Norse, PA-C Electronically Signed  By: Aletta Edouard M.D.    On: 09/24/2019 11:29   IR US Guide Vasc Access Right  Result Date: 09/20/2019 INDICATION: 39 year old female referred for tunneled hemodialysis catheter EXAM: TUNNELED CENTRAL VENOUS HEMODIALYSIS CATHETER PLACEMENT WITH ULTRASOUND AND FLUOROSCOPIC GUIDANCE MEDICATIONS: 2 g Ancef. The antibiotic was Reynolds in an appropriate time interval prior to skin puncture. ANESTHESIA/SEDATION: Moderate (conscious) sedation was employed during this procedure. A total of Versed 1.0 mg and Fentanyl 50 mcg was administered intravenously. Moderate Sedation Time: 15 minutes. The patient's level of consciousness and vital signs were monitored continuously by radiology nursing throughout the procedure under my direct supervision. FLUOROSCOPY TIME:  Fluoroscopy Time: 0 minutes 12 seconds (1 mGy). COMPLICATIONS: None PROCEDURE: Informed written consent was obtained from the patient after a discussion of the risks, benefits, and alternatives to treatment. Questions regarding the procedure were encouraged and answered. The right neck and chest were prepped with chlorhexidine in a sterile fashion, and a sterile drape was applied covering the operative field. Maximum barrier sterile technique with sterile gowns and gloves were used for the procedure. A timeout was performed prior to the initiation of the procedure. Ultrasound survey was performed. Micropuncture kit was utilized to access the right internal jugular vein under direct, real-time ultrasound guidance after the overlying soft tissues were anesthetized with 1% lidocaine with epinephrine. Stab incision was made with 11 blade scalpel. Microwire was passed centrally. The microwire was then marked to measure appropriate internal catheter length. External tunneled length was estimated. A total tip to cuff length of 19 cm was selected. 035 guidewire was advanced to the level of the IVC. Skin and subcutaneous tissues of chest wall below the clavicle were generously infiltrated with 1%  lidocaine for local anesthesia. A small stab incision was made with 11 blade scalpel. The selected hemodialysis catheter was tunneled in a retrograde fashion from the anterior chest wall to the venotomy incision. Serial dilation was performed and then a peel-away sheath was placed. The catheter was then placed through the peel-away sheath with tips ultimately positioned within the superior aspect of the right atrium. Final catheter positioning was confirmed and documented with a spot radiographic image. The catheter aspirates and flushes normally. The catheter was flushed with appropriate volume heparin dwells. The catheter exit site was secured with a 0-Prolene retention suture. Gel-Foam slurry was infused into the soft tissue tract. The venotomy incision was closed Derma bond and sterile dressing. Dressings were applied at the chest wall. Patient tolerated the procedure well and remained hemodynamically stable throughout. No complications were encountered and no significant blood loss encountered. IMPRESSION: Status post right IJ tunneled hemodialysis catheter. Catheter ready for use. Signed, Dulcy Fanny. Dellia Nims, RPVI [facility]iology [facility] Radiology Electronically Signed   By: Corrie Mckusick D.O.   On: 09/20/2019 14:49   DG Foot Complete Left  Result Date: 09/13/2019 CLINICAL DATA:  Soft tissue infection EXAM: LEFT FOOT - COMPLETE 3+ VIEW COMPARISON:  September 05, 2019 FINDINGS: Frontal, oblique, and lateral views were obtained. There is extensive soft tissue air tracking through the first digit from the proximal first metatarsal to the distal aspect of the first digit. There is a focal area of apparent rarefaction along the distal aspect of the first distal phalanx along the medial and lateral compartments. There may be a degree of bony destruction in this region. No similar changes suggesting bony destruction elsewhere. There is a fracture along the lateral aspect of the  proximal portion of the first distal  phalanx. No other fracture evident. No dislocation. No joint space narrowing. IMPRESSION: 1. Soft tissue air tracking along the first digit extending from the first distal phalanx to the proximal first metatarsal consistent with soft tissue infection. 2. Concern for bony destruction along the distal aspect of the first proximal phalanx. 3. Fracture along the lateral aspect of the proximal portion of the first distal phalanx. Electronically Signed   By: Lowella Grip III M.D.   On: 09/13/2019 13:53   DG Foot Complete Left  Result Date: 09/05/2019 CLINICAL DATA:  52 year old who stepped on a piece of glass approximately 2 weeks ago and now has a wound on the great toe. Initial imaging encounter. EXAM: LEFT FOOT - COMPLETE 3+ VIEW COMPARISON:  None. FINDINGS: Soft tissue laceration laterally involving the great toe. Marked DORSAL soft tissue swelling and marked swelling involving the great toe. No evidence of acute or subacute fracture. No visible opaque foreign body. Well-preserved bone mineral density. Well preserved joint spaces. Large plantar calcaneal spur. IMPRESSION: 1. No acute or subacute osseous abnormality. No visible opaque foreign body. 2. Large plantar calcaneal spur. 3. Soft tissue laceration laterally involving the great toe. Marked DORSAL soft tissue swelling and marked swelling involving the great toe. Electronically Signed   By: Evangeline Dakin M.D.   On: 09/05/2019 18:30   VAS [place] ABI WITH/WO TBI  Result Date: 09/13/2019 LOWER EXTREMITY DOPPLER STUDY Indications: Ulceration. High Risk Factors: Diabetes.  Limitations: Today's exam was limited due to an open wound. Comparison Study: No prior studies. Performing Technologist: Carlos Levering RVT  Examination Guidelines: A complete evaluation includes at minimum, Doppler waveform signals and systolic blood pressure reading at the level of bilateral brachial, anterior tibial, and posterior tibial arteries,  when vessel segments are accessible. Bilateral testing is considered an integral part of a complete examination. Photoelectric Plethysmograph (PPG) waveforms and toe systolic pressure readings are included as required and additional duplex testing as needed. Limited examinations for reoccurring indications may be performed as noted.  ABI Findings: +---------+------------------+-----+---------+--------+ Right    Rt Pressure (mmHg)IndexWaveform Comment  +---------+------------------+-----+---------+--------+ Brachial 100                    triphasic         +---------+------------------+-----+---------+--------+ PTA      95                0.95 triphasic         +---------+------------------+-----+---------+--------+ DP       89                0.89 triphasic         +---------+------------------+-----+---------+--------+ Great Toe79                0.79                   +---------+------------------+-----+---------+--------+ +--------+------------------+-----+----------+-------+ Left    Lt Pressure (mmHg)IndexWaveform  Comment +--------+------------------+-----+----------+-------+ Brachial                                 IV      +--------+------------------+-----+----------+-------+ PTA     94                0.94 biphasic          +--------+------------------+-----+----------+-------+ DP      73                0.73 monophasic        +--------+------------------+-----+----------+-------+ +-------+-----------+-----------+------------+------------+  ABI/TBIToday's ABIToday's TBIPrevious ABIPrevious TBI +-------+-----------+-----------+------------+------------+ Right  0.95       0.79                                +-------+-----------+-----------+------------+------------+ Left   0.94                                           +-------+-----------+-----------+------------+------------+  Summary: Right: Resting right ankle-brachial index is within normal  range. No evidence of significant right lower extremity arterial disease. The right toe-brachial index is normal. Left: Resting left ankle-brachial index indicates mild left lower extremity arterial disease. Unable to obtain TBI due to great toe ulceration.  *See table(s) above for measurements and observations.  Electronically signed by Servando Snare MD on 09/13/2019 at 4:47:23 PM.   Final    ECHOCARDIOGRAM COMPLETE  Result Date: 09/16/2019    ECHOCARDIOGRAM REPORT   Patient Name:   [name] Date of Exam: 09/16/2019 Medical Rec #:  [id number]        Height:       66.5 in Accession #:    [id number]       Weight:       311.7 lb Date of Birth:  07-16-1980         BSA:          2.429 m Patient Age:    35 years         BP:           117/59 mmHg Patient Gender: F                HR:           71 bpm. Exam Location:  Inpatient Procedure: 2D Echo Indications:    acute renal failure  History:        Patient has prior history of Echocardiogram examinations, most                 recent 01/29/2018. Chronic kidney disease. Sepsis.; Risk                 Factors:Diabetes and Hypertension.  Sonographer:    Johny Chess Referring Phys: [id number]  1. Left ventricular ejection fraction, by estimation, is 45 to 50%. The left ventricle has mildly decreased function. The left ventricle demonstrates global hypokinesis. The left ventricular internal cavity size was mildly dilated. Left ventricular diastolic parameters were normal.  2. Right ventricular systolic function is normal. The right ventricular size is normal.  3. Left atrial size was moderately dilated.  4. The mitral valve is normal in structure. Trivial mitral valve regurgitation. No evidence of mitral stenosis.  5. The aortic valve is normal in structure. Aortic valve regurgitation is not visualized. No aortic stenosis is present.  6. The inferior vena cava is dilated in size with >50% respiratory variability, suggesting right atrial pressure of  8 mmHg. FINDINGS  Left Ventricle: Left ventricular ejection fraction, by estimation, is 45 to 50%. The left ventricle has mildly decreased function. The left ventricle demonstrates global hypokinesis. The left ventricular internal cavity size was mildly dilated. There is  no left ventricular hypertrophy. Left ventricular diastolic parameters were normal. Right Ventricle: The right ventricular size is normal. No increase in right ventricular wall thickness. Right ventricular systolic function is normal. Left Atrium: Left  atrial size was moderately dilated. Right Atrium: Right atrial size was normal in size. Pericardium: There is no evidence of pericardial effusion. Mitral Valve: The mitral valve is normal in structure. There is mild thickening of the mitral valve leaflet(s). Trivial mitral valve regurgitation. No evidence of mitral valve stenosis. Tricuspid Valve: The tricuspid valve is normal in structure. Tricuspid valve regurgitation is trivial. No evidence of tricuspid stenosis. Aortic Valve: The aortic valve is normal in structure. Aortic valve regurgitation is not visualized. No aortic stenosis is present. Pulmonic Valve: The pulmonic valve was normal in structure. Pulmonic valve regurgitation is not visualized. No evidence of pulmonic stenosis. Aorta: The aortic root is normal in size and structure. Venous: The inferior vena cava is dilated in size with greater than 50% respiratory variability, suggesting right atrial pressure of 8 mmHg. IAS/Shunts: No atrial level shunt detected by color flow Doppler.  LEFT VENTRICLE PLAX 2D LVIDd:         6.00 cm      Diastology LVIDs:         4.60 cm      LV e' medial:    7.07 cm/s LV PW:         1.30 cm      LV E/e' medial:  12.8 LV IVS:        1.10 cm      LV e' lateral:   7.83 cm/s                             LV E/e' lateral: 11.5  LV Volumes (MOD) LV vol d, MOD A4C: 131.0 ml LV vol s, MOD A4C: 89.6 ml LV SV MOD A4C:     131.0 ml RIGHT VENTRICLE             IVC RV S  prime:     15.10 cm/s  IVC diam: 2.70 cm TAPSE (M-mode): 2.3 cm LEFT ATRIUM             Index       RIGHT ATRIUM           Index LA diam:        4.40 cm 1.81 cm/m  RA Area:     10.90 cm LA Vol (A2C):   62.3 ml 25.65 ml/m RA Volume:   22.80 ml  9.39 ml/m LA Vol (A4C):   92.4 ml 38.05 ml/m LA Biplane Vol: 82.8 ml 34.10 ml/m  AORTIC VALVE LVOT Vmax:   102.00 cm/s LVOT Vmean:  67.200 cm/s LVOT VTI:    0.213 m  AORTA Ao Asc diam: 3.20 cm MITRAL VALVE MV Area (PHT): 3.72 cm    SHUNTS MV Decel Time: 204 msec    Systemic VTI: 0.21 m MV E velocity: 90.40 cm/s MV A velocity: 77.10 cm/s MV E/A ratio:  1.17 Jenkins Rouge MD Electronically signed by Jenkins Rouge MD Signature Date/Time: 09/16/2019/3:49:03 PM    Final     Microbiology: Recent Results (from the past 240 hour(s))  MRSA PCR Screening     Status: None   Collection Time: 09/15/19  4:55 AM   Specimen: Nasal Mucosa; Nasopharyngeal  Result Value Ref Range Status   MRSA by PCR NEGATIVE NEGATIVE Final    Comment:        The GeneXpert MRSA Assay (FDA approved for NASAL specimens only), is one component of a comprehensive MRSA colonization surveillance program. It is not intended to diagnose MRSA infection nor to  guide or monitor treatment for MRSA infections. Performed at [facility], [place] [address], [place], [place] [address]   Aerobic/Anaerobic Culture (surgical/deep wound)     Status: None   Collection Time: 09/17/19  4:18 PM   Specimen: Soft Tissue, Other  Result Value Ref Range Status   Specimen Description TISSUE LEFT TOE  Final   Special Requests LEFT GREAT TOE  Final   Gram Stain   Final    MODERATE WBC PRESENT,BOTH PMN AND MONONUCLEAR FEW GRAM POSITIVE COCCI    Culture   Final    RARE PROTEUS PENNERI RARE ENTEROCOCCUS FAECALIS RARE PREVOTELLA DISIENS BETA LACTAMASE POSITIVE Performed at [facility], [place] [address], [place], [place] [address]    Report Status 09/24/2019 FINAL  Final   Organism ID, Bacteria  PROTEUS PENNERI  Final   Organism ID, Bacteria ENTEROCOCCUS FAECALIS  Final      Susceptibility   Enterococcus faecalis - MIC*    AMPICILLIN <=2 SENSITIVE Sensitive     VANCOMYCIN 1 SENSITIVE Sensitive     GENTAMICIN SYNERGY SENSITIVE Sensitive     * RARE ENTEROCOCCUS FAECALIS   Proteus penneri - MIC*    AMPICILLIN <=2 SENSITIVE Sensitive     CEFAZOLIN <=4 SENSITIVE Sensitive     CEFEPIME <=0.12 SENSITIVE Sensitive     CEFTAZIDIME <=1 SENSITIVE Sensitive     CEFTRIAXONE <=0.25 SENSITIVE Sensitive     CIPROFLOXACIN <=0.25 SENSITIVE Sensitive     GENTAMICIN <=1 SENSITIVE Sensitive     IMIPENEM 2 SENSITIVE Sensitive     TRIMETH/SULFA <=20 SENSITIVE Sensitive     AMPICILLIN/SULBACTAM <=2 SENSITIVE Sensitive     PIP/TAZO <=4 SENSITIVE Sensitive     * RARE PROTEUS PENNERI  Surgical PCR screen     Status: None   Collection Time: 09/17/19 10:21 PM   Specimen: Nasal Mucosa; Nasal Swab  Result Value Ref Range Status   MRSA, PCR NEGATIVE NEGATIVE Final   Staphylococcus aureus NEGATIVE NEGATIVE Final    Comment: (NOTE) The Xpert SA Assay (FDA approved for NASAL specimens in patients 70 years of age and older), is one component of a comprehensive surveillance program. It is not intended to diagnose infection nor to guide or monitor treatment. Performed at [facility], [place] [address] [place], [place] [address]      Labs: Basic Metabolic Panel: Recent Labs  Lab 09/20/19 0859 09/21/19 0512 09/22/19 0410 09/23/19 0418 09/24/19 0328  NA 137 135 137 137 136  K 5.9* 4.8 5.0 4.7 4.6  CL 104 106 106 102 101  CO2 17* 23 20* 23 24  GLUCOSE 117* 90 192* 108* 237*  BUN 81* 60* 60* 41* 40*  CREATININE 6.45* 5.43* 5.29* 4.09* 4.19*  CALCIUM 8.1* 7.7* 8.2* 8.3* 8.4*  PHOS  --   --   --   --  5.5*   Liver Function Tests: Recent Labs  Lab 09/18/19 0946 09/18/19 0946 09/19/19 0607 09/20/19 0859 09/21/19 0512 09/22/19 0410 09/24/19 0328  AST 16  --  20 20 16  12*  --   ALT 9   --  8 7 7  <5  --   ALKPHOS 599*  --  547* 471* 394* 377*  --   BILITOT 2.0*  --  1.1 1.0 0.8 0.7  --   PROT 5.9*  --  6.0* 5.6* 5.6* 5.8*  --   ALBUMIN 1.6*   < > 1.7* 1.6* 1.6* 1.7* 1.8*   < > = values in this  interval not displayed.   No results for input(s): LIPASE, AMYLASE in the last 168 hours. No results for input(s): AMMONIA in the last 168 hours. CBC: Recent Labs  Lab 09/18/19 0946 09/19/19 0607 09/20/19 0859 09/22/19 0410 09/23/19 0418  WBC 16.4* 14.4* 10.3 10.2 10.8*  HGB 9.6* 9.7* 8.0* 7.6* 7.5*  HCT 30.7* 30.1* 25.0* 24.6* 23.9*  MCV 81.6 80.7 81.2 82.3 82.1  PLT 268 303 259 213 214   Cardiac Enzymes: No results for input(s): CKTOTAL, CKMB, CKMBINDEX, TROPONINI in the last 168 hours. BNP: BNP (last 3 results) No results for input(s): BNP in the last 8760 hours.  ProBNP (last 3 results) No results for input(s): PROBNP in the last 8760 hours.  CBG: Recent Labs  Lab 09/23/19 0757 09/23/19 1143 09/23/19 1547 09/23/19 2212 09/24/19 0801  GLUCAP 230* 199* 256* 135* 241*   Signed:  Domenic Polite MD.  [facility] 09/24/2019, 3:13 PM

## 2019-09-24 NOTE — Progress Notes (Signed)
Patient ID: Felicia Reynolds, female   DOB: [birth year], 39 y.o.   MRN: [id number] Patient is postoperative day 5 revision left first ray amputation there is a total of 150 cc in the wound VAC canister there is blood beneath the dressing will go ahead and have the wound VAC removed dry dressing applied the importance of nonweightbearing on the left foot was again reinforced for wound healing.  I ordered a leg rest for her wheelchair on Saturday.  Discharge when medically stable.

## 2019-09-24 NOTE — Progress Notes (Signed)
Pt decided to leave against medical advice. Education given and pt states she understands and also states we will probably see her in about 3 days. HD cath removed by radiology per physician order. Pt wheeled off floor in personal wc. Katherina Right RN

## 2019-09-24 NOTE — Progress Notes (Signed)
Patient upset upon RN entering the room.  Patient stated the following: 1) "They had people in my room today doing a room search for my meds that I get from [place] 2) " They act like they do not want to be in here with me when they are in here" 3) I am missing several person tems and I have not stated anything about it." 4) "I feel I  she can go home and take as well of myself as  I am being caring for now. I want someone to come and take this thing out of my leg in the morning so I can go. Will you help me pack my stuff?" Instructed patient that we  really needed to talk with the DR in the am. She may want to leave the wound vac in please to help clean the wound out so she can possible keep her foot, heal without infection and then she will be out of herr with our blessing. The patient then asked this RN  *" will you help me pack up my things and I will pay you $3 for a cigarette." Informed patient the "packing up would occur when we had orders where she could go to next and no I did not have a cigarette stop smoking 11 years ago." Will continue to monitor.

## 2019-09-24 NOTE — Procedures (Signed)
Pre procedural Dx: AKI Post procedural Dx: Same  Patient leaving AMA today - request for removal of tunneled HD catheter prior to discharge. Successful removal of tunneled right IJ HD catheter - suture, catheter and cuff removed in tact without complication.  Dressing to remain x 24H then may remove and shower. Keep clean, dry, dressed until healed.  EBL: None No immediate complications.  Please see imaging section of Epic for full dictation.  Joaquim Nam PA-C 09/24/2019 11:23 AM

## 2019-09-24 NOTE — Progress Notes (Signed)
Physical Therapy Treatment Patient Details Name: Felicia Reynolds MRN: [id number] DOB: 1980/07/31 Today's Date: 09/24/2019    History of Present Illness Felicia Reynolds is a 39 y.o. female with medical history significant of IDDM with insulin resistance, diabetic neuropathy, hypertension, CKD stage II, chronic iron deficiency anemia secondary to menorrhagia, Suboxone therapy, presented with worsening of left toe/foot infection. 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  She is awaiting IR procedure for IJ tunneled dialysis access    PT Comments    Pt agitated and threatening to leave AMA. Pt non-compliant with L LE NWB and continues to stand and walk in room. Pt re-educated on importance of maintain L LE NWB to promote healing. Pt requiring modA for LB dressing and reports "my husband dresses me everyday". Pt tearful because she wants to be present when her dtr gives birth today. Acute PT to cont to follow. W/c mobility limited today due to transportation coming to take pt to IR.    Follow Up Recommendations  Home health PT;Supervision/Assistance - 24 hour     Equipment Recommendations  Wheelchair cushion (measurements PT) (L leg rest)    Recommendations for Other Services       Precautions / Restrictions Precautions Precautions: Fall Precaution Comments: non-adherent to L LE NWB Required Braces or Orthoses: Other Brace Other Brace: post op shoe (not in room) Restrictions Weight Bearing Restrictions: Yes LLE Weight Bearing: Non weight bearing (per Dr. Jess Barters note)    Mobility  Bed Mobility               General bed mobility comments: pt sitting EOB upon PT arrival  Transfers Overall transfer level: Needs assistance Equipment used: None Transfers: Sit to/from [facility] Sit to Stand: Min guard Stand pivot transfers: Min guard        General transfer comment: pt pulling up on bed rail or BSC across from patient, pt given maximal verbal cues to maintain L LE NWB and gave pt a RW however pt adamently refusing and placing full L LE WBing, std pvt to w/c, again pt non-compliant with L LE NWB.  Ambulation/Gait             General Gait Details: deferred as pt non-compliant with L LE NWB despite education and giving pt RW   Stairs             Wheelchair Mobility    Modified Rankin (Stroke Patients Only)       Balance Overall balance assessment: Needs assistance Sitting-balance support: Feet supported Sitting balance-Leahy Scale: Good Sitting balance - Comments: sat EOB to don clothes                                    Cognition Arousal/Alertness: Awake/alert Behavior During Therapy: Anxious Overall Cognitive Status: No family/caregiver present to determine baseline cognitive functioning                                 General Comments: unsure of pt's baseline, pt very adament about leaving AMA today to be present for birth of her graddaughter in [place]. Pt non-compliant with L foot NWB, pt tearful at times      Exercises  General Comments General comments (skin integrity, edema, etc.): pt donned her clothes but required modA for pants as pt can't reach her feet and she was unable to slide either foot through her pants, attempted to teach pt how she can pull up her underwear and pants in the bed however pt continued to stand multiple times to get pants up      Pertinent Vitals/Pain Pain Assessment: Faces Faces Pain Scale: Hurts little more Pain Location: L foot Pain Intervention(s): Limited activity within patient's tolerance    Home Living                      Prior Function            PT Goals (current goals can now be found in the care plan section)      Frequency    Min 3X/week      PT Plan Discharge plan needs to be updated     Co-evaluation              AM-PAC PT "6 Clicks" Mobility   Outcome Measure                   End of Session   Activity Tolerance:  (transportation came to take pt to IR) Patient left:  (in w/c with transportation) Nurse Communication: Mobility status PT Visit Diagnosis: Other abnormalities of gait and mobility (R26.89);Muscle weakness (generalized) (M62.81);Difficulty in walking, not elsewhere classified (R26.2)     Time:  -     Charges:                        Kittie Plater, PT, DPT Acute Rehabilitation Services Pager #: [phone] Office #: [phone]    [place] 09/24/2019, 11:09 AM

## 2019-10-04 ENCOUNTER — Telehealth: Payer: Self-pay | Admitting: Orthopedic Surgery

## 2019-10-04 NOTE — Telephone Encounter (Signed)
Call(voice message) received asking for a call back. Call returned - reached patient's voice mail; left message.

## 2019-10-12 ENCOUNTER — Other Ambulatory Visit: Payer: Self-pay

## 2019-10-12 ENCOUNTER — Encounter (HOSPITAL_COMMUNITY): Payer: Self-pay | Admitting: Emergency Medicine

## 2019-10-12 ENCOUNTER — Emergency Department (HOSPITAL_COMMUNITY): Payer: Medicaid Other

## 2019-10-12 ENCOUNTER — Inpatient Hospital Stay (HOSPITAL_COMMUNITY)
Admission: EM | Admit: 2019-10-12 | Discharge: 2019-11-05 | DRG: 853 | Disposition: E | Payer: Medicaid Other | Attending: Pulmonary Disease | Admitting: Pulmonary Disease

## 2019-10-12 DIAGNOSIS — IMO0002 Reserved for concepts with insufficient information to code with codable children: Secondary | ICD-10-CM | POA: Diagnosis present

## 2019-10-12 DIAGNOSIS — J690 Pneumonitis due to inhalation of food and vomit: Secondary | ICD-10-CM | POA: Diagnosis not present

## 2019-10-12 DIAGNOSIS — T148XXA Other injury of unspecified body region, initial encounter: Secondary | ICD-10-CM

## 2019-10-12 DIAGNOSIS — J9602 Acute respiratory failure with hypercapnia: Secondary | ICD-10-CM | POA: Diagnosis not present

## 2019-10-12 DIAGNOSIS — A4151 Sepsis due to Escherichia coli [E. coli]: Secondary | ICD-10-CM | POA: Diagnosis not present

## 2019-10-12 DIAGNOSIS — F319 Bipolar disorder, unspecified: Secondary | ICD-10-CM | POA: Diagnosis present

## 2019-10-12 DIAGNOSIS — I129 Hypertensive chronic kidney disease with stage 1 through stage 4 chronic kidney disease, or unspecified chronic kidney disease: Secondary | ICD-10-CM | POA: Diagnosis present

## 2019-10-12 DIAGNOSIS — Z91199 Patient's noncompliance with other medical treatment and regimen due to unspecified reason: Secondary | ICD-10-CM

## 2019-10-12 DIAGNOSIS — N920 Excessive and frequent menstruation with regular cycle: Secondary | ICD-10-CM | POA: Diagnosis present

## 2019-10-12 DIAGNOSIS — G931 Anoxic brain damage, not elsewhere classified: Secondary | ICD-10-CM | POA: Diagnosis not present

## 2019-10-12 DIAGNOSIS — M869 Osteomyelitis, unspecified: Secondary | ICD-10-CM | POA: Diagnosis present

## 2019-10-12 DIAGNOSIS — Z8673 Personal history of transient ischemic attack (TIA), and cerebral infarction without residual deficits: Secondary | ICD-10-CM

## 2019-10-12 DIAGNOSIS — I97121 Postprocedural cardiac arrest following other surgery: Secondary | ICD-10-CM | POA: Diagnosis not present

## 2019-10-12 DIAGNOSIS — T8130XA Disruption of wound, unspecified, initial encounter: Secondary | ICD-10-CM | POA: Diagnosis present

## 2019-10-12 DIAGNOSIS — E1165 Type 2 diabetes mellitus with hyperglycemia: Secondary | ICD-10-CM | POA: Diagnosis present

## 2019-10-12 DIAGNOSIS — M797 Fibromyalgia: Secondary | ICD-10-CM | POA: Diagnosis present

## 2019-10-12 DIAGNOSIS — E1142 Type 2 diabetes mellitus with diabetic polyneuropathy: Secondary | ICD-10-CM | POA: Diagnosis present

## 2019-10-12 DIAGNOSIS — M86272 Subacute osteomyelitis, left ankle and foot: Secondary | ICD-10-CM

## 2019-10-12 DIAGNOSIS — Z20822 Contact with and (suspected) exposure to covid-19: Secondary | ICD-10-CM | POA: Diagnosis present

## 2019-10-12 DIAGNOSIS — E43 Unspecified severe protein-calorie malnutrition: Secondary | ICD-10-CM | POA: Diagnosis not present

## 2019-10-12 DIAGNOSIS — Z794 Long term (current) use of insulin: Secondary | ICD-10-CM

## 2019-10-12 DIAGNOSIS — Z515 Encounter for palliative care: Secondary | ICD-10-CM

## 2019-10-12 DIAGNOSIS — L8990 Pressure ulcer of unspecified site, unspecified stage: Secondary | ICD-10-CM | POA: Insufficient documentation

## 2019-10-12 DIAGNOSIS — Z23 Encounter for immunization: Secondary | ICD-10-CM

## 2019-10-12 DIAGNOSIS — R6521 Severe sepsis with septic shock: Secondary | ICD-10-CM | POA: Diagnosis not present

## 2019-10-12 DIAGNOSIS — N184 Chronic kidney disease, stage 4 (severe): Secondary | ICD-10-CM | POA: Diagnosis present

## 2019-10-12 DIAGNOSIS — D509 Iron deficiency anemia, unspecified: Secondary | ICD-10-CM | POA: Diagnosis present

## 2019-10-12 DIAGNOSIS — I10 Essential (primary) hypertension: Secondary | ICD-10-CM | POA: Diagnosis present

## 2019-10-12 DIAGNOSIS — Y835 Amputation of limb(s) as the cause of abnormal reaction of the patient, or of later complication, without mention of misadventure at the time of the procedure: Secondary | ICD-10-CM | POA: Diagnosis not present

## 2019-10-12 DIAGNOSIS — L89152 Pressure ulcer of sacral region, stage 2: Secondary | ICD-10-CM | POA: Diagnosis not present

## 2019-10-12 DIAGNOSIS — E874 Mixed disorder of acid-base balance: Secondary | ICD-10-CM | POA: Diagnosis present

## 2019-10-12 DIAGNOSIS — E1122 Type 2 diabetes mellitus with diabetic chronic kidney disease: Secondary | ICD-10-CM | POA: Diagnosis present

## 2019-10-12 DIAGNOSIS — Z89412 Acquired absence of left great toe: Secondary | ICD-10-CM

## 2019-10-12 DIAGNOSIS — F410 Panic disorder [episodic paroxysmal anxiety] without agoraphobia: Secondary | ICD-10-CM | POA: Diagnosis present

## 2019-10-12 DIAGNOSIS — L089 Local infection of the skin and subcutaneous tissue, unspecified: Secondary | ICD-10-CM

## 2019-10-12 DIAGNOSIS — E11628 Type 2 diabetes mellitus with other skin complications: Secondary | ICD-10-CM | POA: Diagnosis present

## 2019-10-12 DIAGNOSIS — A419 Sepsis, unspecified organism: Secondary | ICD-10-CM | POA: Diagnosis present

## 2019-10-12 DIAGNOSIS — M8669 Other chronic osteomyelitis, multiple sites: Secondary | ICD-10-CM | POA: Diagnosis present

## 2019-10-12 DIAGNOSIS — K7200 Acute and subacute hepatic failure without coma: Secondary | ICD-10-CM | POA: Diagnosis not present

## 2019-10-12 DIAGNOSIS — L02612 Cutaneous abscess of left foot: Secondary | ICD-10-CM

## 2019-10-12 DIAGNOSIS — Y92239 Unspecified place in hospital as the place of occurrence of the external cause: Secondary | ICD-10-CM | POA: Diagnosis not present

## 2019-10-12 DIAGNOSIS — B9689 Other specified bacterial agents as the cause of diseases classified elsewhere: Secondary | ICD-10-CM | POA: Diagnosis present

## 2019-10-12 DIAGNOSIS — N179 Acute kidney failure, unspecified: Secondary | ICD-10-CM | POA: Diagnosis present

## 2019-10-12 DIAGNOSIS — E1169 Type 2 diabetes mellitus with other specified complication: Secondary | ICD-10-CM | POA: Diagnosis present

## 2019-10-12 DIAGNOSIS — Z9119 Patient's noncompliance with other medical treatment and regimen: Secondary | ICD-10-CM

## 2019-10-12 DIAGNOSIS — K760 Fatty (change of) liver, not elsewhere classified: Secondary | ICD-10-CM | POA: Diagnosis present

## 2019-10-12 DIAGNOSIS — Z66 Do not resuscitate: Secondary | ICD-10-CM | POA: Diagnosis not present

## 2019-10-12 DIAGNOSIS — Z452 Encounter for adjustment and management of vascular access device: Secondary | ICD-10-CM

## 2019-10-12 DIAGNOSIS — I469 Cardiac arrest, cause unspecified: Secondary | ICD-10-CM

## 2019-10-12 DIAGNOSIS — R579 Shock, unspecified: Secondary | ICD-10-CM

## 2019-10-12 DIAGNOSIS — G894 Chronic pain syndrome: Secondary | ICD-10-CM | POA: Diagnosis present

## 2019-10-12 DIAGNOSIS — J9601 Acute respiratory failure with hypoxia: Secondary | ICD-10-CM | POA: Diagnosis not present

## 2019-10-12 DIAGNOSIS — L97429 Non-pressure chronic ulcer of left heel and midfoot with unspecified severity: Secondary | ICD-10-CM | POA: Diagnosis present

## 2019-10-12 DIAGNOSIS — Z6837 Body mass index (BMI) 37.0-37.9, adult: Secondary | ICD-10-CM

## 2019-10-12 DIAGNOSIS — M810 Age-related osteoporosis without current pathological fracture: Secondary | ICD-10-CM | POA: Diagnosis present

## 2019-10-12 DIAGNOSIS — F17210 Nicotine dependence, cigarettes, uncomplicated: Secondary | ICD-10-CM | POA: Diagnosis present

## 2019-10-12 DIAGNOSIS — E785 Hyperlipidemia, unspecified: Secondary | ICD-10-CM | POA: Diagnosis present

## 2019-10-12 LAB — COMPREHENSIVE METABOLIC PANEL
ALT: 30 U/L (ref 0–44)
AST: 46 U/L — ABNORMAL HIGH (ref 15–41)
Albumin: 2.4 g/dL — ABNORMAL LOW (ref 3.5–5.0)
Alkaline Phosphatase: 469 U/L — ABNORMAL HIGH (ref 38–126)
Anion gap: 13 (ref 5–15)
BUN: 48 mg/dL — ABNORMAL HIGH (ref 6–20)
CO2: 18 mmol/L — ABNORMAL LOW (ref 22–32)
Calcium: 8.5 mg/dL — ABNORMAL LOW (ref 8.9–10.3)
Chloride: 104 mmol/L (ref 98–111)
Creatinine, Ser: 3.69 mg/dL — ABNORMAL HIGH (ref 0.44–1.00)
GFR, Estimated: 14 mL/min — ABNORMAL LOW (ref >60)
Glucose, Bld: 169 mg/dL — ABNORMAL HIGH (ref 70–99)
Potassium: 4.2 mmol/L (ref 3.5–5.1)
Sodium: 135 mmol/L (ref 135–145)
Total Bilirubin: 1 mg/dL (ref 0.3–1.2)
Total Protein: 7.5 g/dL (ref 6.5–8.1)

## 2019-10-12 LAB — LACTIC ACID, PLASMA: Lactic Acid, Venous: 3.7 mmol/L (ref 0.5–1.9)

## 2019-10-12 MED ORDER — ACETAMINOPHEN 500 MG PO TABS
1000.0000 mg | ORAL_TABLET | Freq: Once | ORAL | Status: AC
  Administered 2019-10-12: 1000 mg via ORAL
  Filled 2019-10-12: qty 2

## 2019-10-12 NOTE — ED Notes (Addendum)
Date and time results received: 10/16/2019 2339  Test: lactic Critical Value: 3.7  Name of Provider Notified: Sedonia Small, MD  Orders Received? Or Actions Taken?: acknowledged

## 2019-10-12 NOTE — ED Triage Notes (Signed)
Patient states she had had an amputation of 2 of her toes on her left foot about 1 month ago. Patient states that her left foot busted open 2 nights ago. Patient presents with a fever of 102.5 oral. Patient's foot has a foul odor.

## 2019-10-13 ENCOUNTER — Emergency Department (HOSPITAL_COMMUNITY): Payer: Medicaid Other

## 2019-10-13 ENCOUNTER — Other Ambulatory Visit: Payer: Self-pay

## 2019-10-13 DIAGNOSIS — J9601 Acute respiratory failure with hypoxia: Secondary | ICD-10-CM | POA: Diagnosis not present

## 2019-10-13 DIAGNOSIS — Z66 Do not resuscitate: Secondary | ICD-10-CM | POA: Diagnosis not present

## 2019-10-13 DIAGNOSIS — J690 Pneumonitis due to inhalation of food and vomit: Secondary | ICD-10-CM | POA: Diagnosis not present

## 2019-10-13 DIAGNOSIS — Y835 Amputation of limb(s) as the cause of abnormal reaction of the patient, or of later complication, without mention of misadventure at the time of the procedure: Secondary | ICD-10-CM | POA: Diagnosis not present

## 2019-10-13 DIAGNOSIS — L97429 Non-pressure chronic ulcer of left heel and midfoot with unspecified severity: Secondary | ICD-10-CM | POA: Diagnosis not present

## 2019-10-13 DIAGNOSIS — T148XXA Other injury of unspecified body region, initial encounter: Secondary | ICD-10-CM | POA: Diagnosis present

## 2019-10-13 DIAGNOSIS — M8669 Other chronic osteomyelitis, multiple sites: Secondary | ICD-10-CM | POA: Diagnosis present

## 2019-10-13 DIAGNOSIS — K7200 Acute and subacute hepatic failure without coma: Secondary | ICD-10-CM | POA: Diagnosis not present

## 2019-10-13 DIAGNOSIS — L089 Local infection of the skin and subcutaneous tissue, unspecified: Secondary | ICD-10-CM | POA: Diagnosis not present

## 2019-10-13 DIAGNOSIS — N179 Acute kidney failure, unspecified: Secondary | ICD-10-CM | POA: Diagnosis not present

## 2019-10-13 DIAGNOSIS — E11628 Type 2 diabetes mellitus with other skin complications: Secondary | ICD-10-CM | POA: Diagnosis present

## 2019-10-13 DIAGNOSIS — F319 Bipolar disorder, unspecified: Secondary | ICD-10-CM

## 2019-10-13 DIAGNOSIS — L02612 Cutaneous abscess of left foot: Secondary | ICD-10-CM | POA: Diagnosis not present

## 2019-10-13 DIAGNOSIS — A4151 Sepsis due to Escherichia coli [E. coli]: Secondary | ICD-10-CM | POA: Diagnosis present

## 2019-10-13 DIAGNOSIS — Z515 Encounter for palliative care: Secondary | ICD-10-CM | POA: Diagnosis not present

## 2019-10-13 DIAGNOSIS — E1142 Type 2 diabetes mellitus with diabetic polyneuropathy: Secondary | ICD-10-CM | POA: Diagnosis not present

## 2019-10-13 DIAGNOSIS — I97121 Postprocedural cardiac arrest following other surgery: Secondary | ICD-10-CM | POA: Diagnosis not present

## 2019-10-13 DIAGNOSIS — T8130XA Disruption of wound, unspecified, initial encounter: Secondary | ICD-10-CM | POA: Diagnosis not present

## 2019-10-13 DIAGNOSIS — M86272 Subacute osteomyelitis, left ankle and foot: Secondary | ICD-10-CM | POA: Diagnosis present

## 2019-10-13 DIAGNOSIS — G931 Anoxic brain damage, not elsewhere classified: Secondary | ICD-10-CM | POA: Diagnosis not present

## 2019-10-13 DIAGNOSIS — I469 Cardiac arrest, cause unspecified: Secondary | ICD-10-CM | POA: Diagnosis not present

## 2019-10-13 DIAGNOSIS — E1169 Type 2 diabetes mellitus with other specified complication: Secondary | ICD-10-CM | POA: Diagnosis not present

## 2019-10-13 DIAGNOSIS — J9602 Acute respiratory failure with hypercapnia: Secondary | ICD-10-CM | POA: Diagnosis not present

## 2019-10-13 DIAGNOSIS — E874 Mixed disorder of acid-base balance: Secondary | ICD-10-CM | POA: Diagnosis not present

## 2019-10-13 DIAGNOSIS — Y92239 Unspecified place in hospital as the place of occurrence of the external cause: Secondary | ICD-10-CM | POA: Diagnosis not present

## 2019-10-13 DIAGNOSIS — Z23 Encounter for immunization: Secondary | ICD-10-CM | POA: Diagnosis not present

## 2019-10-13 DIAGNOSIS — A419 Sepsis, unspecified organism: Secondary | ICD-10-CM | POA: Diagnosis not present

## 2019-10-13 DIAGNOSIS — E43 Unspecified severe protein-calorie malnutrition: Secondary | ICD-10-CM | POA: Diagnosis present

## 2019-10-13 DIAGNOSIS — F410 Panic disorder [episodic paroxysmal anxiety] without agoraphobia: Secondary | ICD-10-CM | POA: Diagnosis not present

## 2019-10-13 DIAGNOSIS — N184 Chronic kidney disease, stage 4 (severe): Secondary | ICD-10-CM | POA: Diagnosis not present

## 2019-10-13 DIAGNOSIS — R7989 Other specified abnormal findings of blood chemistry: Secondary | ICD-10-CM | POA: Diagnosis not present

## 2019-10-13 DIAGNOSIS — R6521 Severe sepsis with septic shock: Secondary | ICD-10-CM | POA: Diagnosis not present

## 2019-10-13 DIAGNOSIS — M7989 Other specified soft tissue disorders: Secondary | ICD-10-CM | POA: Diagnosis not present

## 2019-10-13 DIAGNOSIS — Z20822 Contact with and (suspected) exposure to covid-19: Secondary | ICD-10-CM | POA: Diagnosis present

## 2019-10-13 LAB — BLOOD CULTURE ID PANEL (REFLEXED) - BCID2

## 2019-10-13 LAB — CBC WITH DIFFERENTIAL/PLATELET
Abs Immature Granulocytes: 0.03 10*3/uL (ref 0.00–0.07)
Basophils Absolute: 0 10*3/uL (ref 0.0–0.1)
Basophils Relative: 0 %
Eosinophils Absolute: 0 10*3/uL (ref 0.0–0.5)
Eosinophils Relative: 0 %
HCT: 26 % — ABNORMAL LOW (ref 36.0–46.0)
Hemoglobin: 7.8 g/dL — ABNORMAL LOW (ref 12.0–15.0)
Immature Granulocytes: 1 %
Lymphocytes Relative: 8 %
Lymphs Abs: 0.3 10*3/uL — ABNORMAL LOW (ref 0.7–4.0)
MCH: 24.1 pg — ABNORMAL LOW (ref 26.0–34.0)
MCHC: 30 g/dL (ref 30.0–36.0)
MCV: 80.5 fL (ref 80.0–100.0)
Monocytes Absolute: 0.1 10*3/uL (ref 0.1–1.0)
Monocytes Relative: 1 %
Neutro Abs: 3.6 10*3/uL (ref 1.7–7.7)
Neutrophils Relative %: 90 %
Platelets: 210 10*3/uL (ref 150–400)
RBC: 3.23 MIL/uL — ABNORMAL LOW (ref 3.87–5.11)
RDW: 22.2 % — ABNORMAL HIGH (ref 11.5–15.5)
WBC: 4.1 10*3/uL (ref 4.0–10.5)
nRBC: 1 % — ABNORMAL HIGH (ref 0.0–0.2)

## 2019-10-13 LAB — RESPIRATORY PANEL BY RT PCR (FLU A&B, COVID)
Influenza A by PCR: NEGATIVE
Influenza B by PCR: NEGATIVE
SARS Coronavirus 2 by RT PCR: NEGATIVE

## 2019-10-13 LAB — GLUCOSE, CAPILLARY
Glucose-Capillary: 200 mg/dL — ABNORMAL HIGH (ref 70–99)
Glucose-Capillary: 197 mg/dL — ABNORMAL HIGH (ref 70–99)

## 2019-10-13 LAB — CREATININE, SERUM
Creatinine, Ser: 4.2 mg/dL — ABNORMAL HIGH (ref 0.44–1.00)
GFR, Estimated: 12 mL/min — ABNORMAL LOW (ref >60)

## 2019-10-13 LAB — APTT: aPTT: 37 seconds — ABNORMAL HIGH (ref 24–36)

## 2019-10-13 LAB — PROTIME-INR
INR: 1.3 — ABNORMAL HIGH (ref 0.8–1.2)
Prothrombin Time: 15.7 seconds — ABNORMAL HIGH (ref 11.4–15.2)

## 2019-10-13 LAB — LACTIC ACID, PLASMA
Lactic Acid, Venous: 4.5 mmol/L (ref 0.5–1.9)
Lactic Acid, Venous: 3.2 mmol/L (ref 0.5–1.9)

## 2019-10-13 MED ORDER — VANCOMYCIN HCL 1500 MG/300ML IV SOLN: 1500.0000 mg | INTRAVENOUS | Status: DC

## 2019-10-13 MED ORDER — SODIUM CHLORIDE 0.9 % IV SOLN: 250.0000 mL | INTRAVENOUS | Status: DC | PRN

## 2019-10-13 MED ORDER — SORBITOL 70 % SOLN
30.0000 mL | Freq: Every day | Status: DC | PRN
  Filled 2019-10-13: qty 30

## 2019-10-13 MED ORDER — CLINDAMYCIN PHOSPHATE 900 MG/50ML IV SOLN
900.0000 mg | Freq: Once | INTRAVENOUS | Status: AC
  Administered 2019-10-13: 900 mg via INTRAVENOUS
  Filled 2019-10-13: qty 50

## 2019-10-13 MED ORDER — ONDANSETRON HCL 4 MG/2ML IJ SOLN: 4.0000 mg | Freq: Four times a day (QID) | INTRAMUSCULAR | Status: DC | PRN

## 2019-10-13 MED ORDER — PIPERACILLIN-TAZOBACTAM 3.375 G IVPB 30 MIN
3.3750 g | Freq: Once | INTRAVENOUS | Status: AC
  Administered 2019-10-13: 3.375 g via INTRAVENOUS
  Filled 2019-10-13: qty 50

## 2019-10-13 MED ORDER — METRONIDAZOLE IN NACL 5-0.79 MG/ML-% IV SOLN
500.0000 mg | Freq: Three times a day (TID) | INTRAVENOUS | Status: DC
  Administered 2019-10-13 – 2019-10-15 (×6): 500 mg via INTRAVENOUS
  Filled 2019-10-13 (×6): qty 100

## 2019-10-13 MED ORDER — INSULIN ASPART 100 UNIT/ML ~~LOC~~ SOLN
0.0000 [IU] | Freq: Three times a day (TID) | SUBCUTANEOUS | Status: DC
  Administered 2019-10-13 – 2019-10-14 (×2): 2 [IU] via SUBCUTANEOUS

## 2019-10-13 MED ORDER — LACTATED RINGERS IV BOLUS (SEPSIS)
1000.0000 mL | Freq: Once | INTRAVENOUS | Status: AC
  Administered 2019-10-13: 1000 mL via INTRAVENOUS

## 2019-10-13 MED ORDER — SODIUM CHLORIDE 0.9% IV SOLUTION: Freq: Once | INTRAVENOUS | Status: DC

## 2019-10-13 MED ORDER — ONDANSETRON HCL 4 MG PO TABS: 4.0000 mg | ORAL_TABLET | Freq: Four times a day (QID) | ORAL | Status: DC | PRN

## 2019-10-13 MED ORDER — VANCOMYCIN HCL 2000 MG/400ML IV SOLN
2000.0000 mg | Freq: Once | INTRAVENOUS | Status: AC
  Administered 2019-10-13: 2000 mg via INTRAVENOUS
  Filled 2019-10-13: qty 400

## 2019-10-13 MED ORDER — POLYETHYLENE GLYCOL 3350 17 G PO PACK: 17.0000 g | PACK | Freq: Every day | ORAL | Status: DC | PRN

## 2019-10-13 MED ORDER — SODIUM CHLORIDE 0.9% FLUSH
3.0000 mL | Freq: Two times a day (BID) | INTRAVENOUS | Status: DC
  Administered 2019-10-13 – 2019-10-17 (×9): 3 mL via INTRAVENOUS

## 2019-10-13 MED ORDER — HEPARIN SODIUM (PORCINE) 5000 UNIT/ML IJ SOLN
5000.0000 [IU] | Freq: Three times a day (TID) | INTRAMUSCULAR | Status: DC
  Administered 2019-10-13 – 2019-10-21 (×22): 5000 [IU] via SUBCUTANEOUS
  Filled 2019-10-13 (×22): qty 1

## 2019-10-13 MED ORDER — ACETAMINOPHEN 325 MG PO TABS
650.0000 mg | ORAL_TABLET | Freq: Four times a day (QID) | ORAL | Status: DC | PRN
  Administered 2019-10-14 – 2019-10-16 (×4): 650 mg via ORAL
  Filled 2019-10-13 (×5): qty 2

## 2019-10-13 MED ORDER — SODIUM CHLORIDE 0.9% FLUSH: 3.0000 mL | INTRAVENOUS | Status: DC | PRN

## 2019-10-13 MED ORDER — OXYCODONE HCL 5 MG PO TABS
5.0000 mg | ORAL_TABLET | ORAL | Status: DC | PRN
  Administered 2019-10-13 – 2019-10-16 (×7): 5 mg via ORAL
  Filled 2019-10-13 (×7): qty 1

## 2019-10-13 MED ORDER — ACETAMINOPHEN 650 MG RE SUPP: 650.0000 mg | Freq: Four times a day (QID) | RECTAL | Status: DC | PRN

## 2019-10-13 MED ORDER — LACTATED RINGERS IV BOLUS (SEPSIS)
800.0000 mL | Freq: Once | INTRAVENOUS | Status: AC
  Administered 2019-10-13: 800 mL via INTRAVENOUS

## 2019-10-13 MED ORDER — SODIUM CHLORIDE 0.9 % IV SOLN
2.0000 g | INTRAVENOUS | Status: DC
  Administered 2019-10-13 – 2019-10-14 (×2): 2 g via INTRAVENOUS
  Filled 2019-10-13 (×2): qty 2

## 2019-10-13 NOTE — ED Notes (Signed)
Date and time results received: 10/13/19  Test: Lactic acid Critical Value: 4.5  Name of Provider Notified: Dr. Sedonia Small  Orders Received? Or Actions Taken?: Actions Taken: medication admin

## 2019-10-13 NOTE — Progress Notes (Signed)
PHARMACY - PHYSICIAN COMMUNICATION CRITICAL VALUE ALERT - BLOOD CULTURE IDENTIFICATION (BCID)  Felicia Reynolds is an 39 y.o. female who presented to [facility] on 10/09/2019 with infection of the left foot.  Assessment:  Diabetic foot infection and history of osteo . Blood cultures show GNR in 1/2 bottles with BCID showing Ecoli.    Name of physician (or Provider) Contacted:   Current antibiotics: Cefepime, vancomycin and flagyl  Changes to prescribed antibiotics recommended:  Recommendations declined by provider - infection may be polymicrobial  Results for orders placed or performed during the hospital encounter of 10/28/2019  Blood Culture ID Panel (Reflexed) (Collected: 11/04/2019 10:52 PM)  Result Value Ref Range   Enterococcus faecalis NOT DETECTED NOT DETECTED   Enterococcus Faecium NOT DETECTED NOT DETECTED   Listeria monocytogenes NOT DETECTED NOT DETECTED   Staphylococcus species NOT DETECTED NOT DETECTED   Staphylococcus aureus (BCID) NOT DETECTED NOT DETECTED   Staphylococcus epidermidis NOT DETECTED NOT DETECTED   Staphylococcus lugdunensis NOT DETECTED NOT DETECTED   Streptococcus species NOT DETECTED NOT DETECTED   Streptococcus agalactiae NOT DETECTED NOT DETECTED   Streptococcus pneumoniae NOT DETECTED NOT DETECTED   Streptococcus pyogenes NOT DETECTED NOT DETECTED   A.calcoaceticus-baumannii NOT DETECTED NOT DETECTED   Bacteroides fragilis NOT DETECTED NOT DETECTED   Enterobacterales DETECTED (A) NOT DETECTED   Enterobacter cloacae complex NOT DETECTED NOT DETECTED   Escherichia coli DETECTED (A) NOT DETECTED   Klebsiella aerogenes NOT DETECTED NOT DETECTED   Klebsiella oxytoca NOT DETECTED NOT DETECTED   Klebsiella pneumoniae NOT DETECTED NOT DETECTED   Proteus species NOT DETECTED NOT DETECTED   Salmonella species NOT DETECTED NOT DETECTED   Serratia marcescens NOT DETECTED NOT DETECTED   Haemophilus influenzae NOT DETECTED NOT DETECTED   Neisseria  meningitidis NOT DETECTED NOT DETECTED   Pseudomonas aeruginosa NOT DETECTED NOT DETECTED   Stenotrophomonas maltophilia NOT DETECTED NOT DETECTED   Candida albicans NOT DETECTED NOT DETECTED   Candida auris NOT DETECTED NOT DETECTED   Candida glabrata NOT DETECTED NOT DETECTED   Candida krusei NOT DETECTED NOT DETECTED   Candida parapsilosis NOT DETECTED NOT DETECTED   Candida tropicalis NOT DETECTED NOT DETECTED   Cryptococcus neoformans/gattii NOT DETECTED NOT DETECTED   CTX-M ESBL NOT DETECTED NOT DETECTED   Carbapenem resistance IMP NOT DETECTED NOT DETECTED   Carbapenem resistance KPC NOT DETECTED NOT DETECTED   Carbapenem resistance NDM NOT DETECTED NOT DETECTED   Carbapenem resist OXA 48 LIKE NOT DETECTED NOT DETECTED   Carbapenem resistance VIM NOT DETECTED NOT DETECTED   Hildred Laser, PharmD Clinical Pharmacist **Pharmacist phone directory can now be found on [email or web address] ([email or web address]).  Listed under [place].

## 2019-10-13 NOTE — Progress Notes (Signed)
Inpatient Diabetes Program Recommendations  AACE/ADA: New Consensus Statement on Inpatient Glycemic Control (2015)  Target Ranges:  Prepandial:   less than 140 mg/dL      Peak postprandial:   less than 180 mg/dL (1-2 hours)      Critically ill patients:  140 - 180 mg/dL   Lab Results  Component Value Date   GLUCAP 241 (H) 09/24/2019   HGBA1C 9.0 (H) 09/13/2019    Review of Glycemic Control  Diabetes history: DM 2 Outpatient Diabetes medications: Lantus 80 units BID, Novolog 2-30 units TID  Current orders for Inpatient glycemic control: Novolog 0-9 units tid  Last admission beginning of September  Inpatient Diabetes Program Recommendations:    -  Consider Lantus 40 units qhs -  Novolog 0-15 units tid + hs -  Novolog 6 units tid meal coverage if eating >50% of meals.  Diabetes Coordinator spoke with pt during last admission.   Thanks,  Tama Headings RN, MSN, BC-ADM Inpatient Diabetes Coordinator Team Pager [phone] (8a-5p)

## 2019-10-13 NOTE — ED Provider Notes (Addendum)
[facility] Emergency Department Provider Note MRN:  [id number]  Arrival date & time: 10/13/19     Chief Complaint   wound check   History of Present Illness   Felicia Reynolds is a 39 y.o. year-old female with a history of diabetes presenting to the ED with chief complaint of wound check.  Patient's wound split open few days ago, now is exhibiting a foul smell, more red, more painful.  Patient endorsing fever, malaise, feels like she is going to die.  Review of Systems  Positive for wound infection, malaise, fever.  Patient's Health History    Past Medical History:  Diagnosis Date   Anxiety    Arthritis    Bipolar disorder ([place])    Diabetes ([place]) 06/05/2015   Diabetes mellitus    Fibromyalgia    History of abnormal cervical Pap smear 05/29/2015   History of diabetes mellitus, type II 05/29/2015   Hypertension    Neuropathy    Obesity 05/29/2015   Osteoporosis    Ovarian cyst 02/20/2016   Ovarian cyst 02/20/2016   Simple cyst rt ovary   Panic attacks    Pneumonia    Stroke ([place])    Superficial fungus infection of skin 05/29/2015   Vitamin D deficiency 06/05/2015    Past Surgical History:  Procedure Laterality Date   AMPUTATION Left 09/17/2019   Procedure: AMPUTATION LEFT GREAT TOE/ RAY WITH WOUND VAC APPLICATION;  Surgeon: Leandrew Koyanagi, MD;  Location: [facility];  Service: Orthopedics;  Laterality: Left;   AMPUTATION Left 09/19/2019   Procedure: LEFT FOOT 1ST RAY AMPUTATION;  Surgeon: Newt Minion, MD;  Location: [facility];  Service: Orthopedics;  Laterality: Left;   CESAREAN SECTION     HERNIA REPAIR     IR FLUORO GUIDE CV LINE RIGHT  09/20/2019   IR REMOVAL TUN CV CATH W/O FL  09/24/2019   IR US GUIDE VASC ACCESS RIGHT  09/20/2019   THROAT SURGERY      Family History  Problem Relation Age of Onset   Cancer Mother 64       breast   Breast cancer Mother    Breast cancer Maternal Aunt    Breast cancer Maternal Grandmother     Cancer Maternal Aunt     Social History   Socioeconomic History   Marital status: Married    Spouse name: Not on file   Number of children: Not on file   Years of education: Not on file   Highest education level: Not on file  Occupational History   Not on file  Tobacco Use   Smoking status: Current Some Day Smoker    Packs/day: 0.50    Years: 13.00    Pack years: 6.50    Types: Cigarettes   Smokeless tobacco: Never Used  Scientific laboratory technician Use: Never used  Substance and Sexual Activity   Alcohol use: No    Alcohol/week: 0.0 standard drinks   Drug use: No   Sexual activity: Yes    Birth control/protection: None  Other Topics Concern   Not on file  Social History Narrative   Has been in contact with social services for assistance with housing and medications,    Social Determinants of Health   Financial Resource Strain: Medium Risk   Difficulty of Paying Living Expenses: Somewhat hard  Food Insecurity: Food Insecurity Present   Worried About Running Out of Food in the Last Year: Sometimes true   Ran Out of  Food in the Last Year: Sometimes true  Transportation Needs: Public librarian (Medical): Yes   Lack of Transportation (Non-Medical): Yes  Physical Activity: Inactive   Days of Exercise per Week: 0 days   Minutes of Exercise per Session: 0 min  Stress: Stress Concern Present   Feeling of Stress : Rather much  Social Connections: Moderately Isolated   Frequency of Communication with Friends and Family: More than three times a week   Frequency of Social Gatherings with Friends and Family: More than three times a week   Attends Religious Services: Never   Marine scientist or Organizations: No   Attends Archivist Meetings: Never   Marital Status: Married  Human resources officer Violence: Not At Risk   Fear of Current or Ex-Partner: No   Emotionally Abused: No   Physically Abused: No    Sexually Abused: No     Physical Exam   Vitals:   10/13/19 0415 10/13/19 0545  BP: 110/63 115/65  Pulse: 91 85  Resp: 17 13  Temp:    SpO2: 100% 97%    CONSTITUTIONAL: Chronically ill-appearing, NAD NEURO:  Alert and oriented x 3, no focal deficits EYES:  eyes equal and reactive ENT/NECK:  no LAD, no JVD CARDIO: Tachycardic rate, well-perfused, normal S1 and S2 PULM:  CTAB no wheezing or rhonchi GI/GU:  normal bowel sounds, non-distended, non-tender MSK/SPINE:  No gross deformities, no edema SKIN: Grossly infected left foot with wound dehiscence medially and purulent material, foul odor PSYCH:  Appropriate speech and behavior  *Additional and/or pertinent findings included in MDM below  Diagnostic and Interventional Summary    EKG Interpretation  Date/Time:  Oct 12, 2019 @22 :38:44  Ventricular Rate:   120 PR Interval:   154 QRS Duration:  78 QT Interval:   292 QTC Calculation:  412 R Axis:     Text Interpretation:  Sinus tach no stemi Confirmed by Dr. Gerlene Fee at 6:22 AM      Labs Reviewed  LACTIC ACID, PLASMA - Abnormal; Notable for the following components:      Result Value   Lactic Acid, Venous 3.7 (*)    All other components within normal limits  LACTIC ACID, PLASMA - Abnormal; Notable for the following components:   Lactic Acid, Venous 4.5 (*)    All other components within normal limits  COMPREHENSIVE METABOLIC PANEL - Abnormal; Notable for the following components:   CO2 18 (*)    Glucose, Bld 169 (*)    BUN 48 (*)    Creatinine, Ser 3.69 (*)    Calcium 8.5 (*)    Albumin 2.4 (*)    AST 46 (*)    Alkaline Phosphatase 469 (*)    GFR, Estimated 14 (*)    All other components within normal limits  CBC WITH DIFFERENTIAL/PLATELET - Abnormal; Notable for the following components:   RBC 3.23 (*)    Hemoglobin 7.8 (*)    HCT 26.0 (*)    MCH 24.1 (*)    RDW 22.2 (*)    nRBC 1.0 (*)    Lymphs Abs 0.3 (*)    All other components within normal  limits  PROTIME-INR - Abnormal; Notable for the following components:   Prothrombin Time 15.7 (*)    INR 1.3 (*)    All other components within normal limits  APTT - Abnormal; Notable for the following components:   aPTT 37 (*)    All other components within  normal limits  RESPIRATORY PANEL BY RT PCR (FLU A&B, COVID)  CULTURE, BLOOD (ROUTINE X 2)  CULTURE, BLOOD (ROUTINE X 2)  URINE CULTURE  URINALYSIS, ROUTINE W REFLEX MICROSCOPIC  LACTIC ACID, PLASMA  POC URINE PREG, ED    DG Chest 2 View  Final Result    CT Foot Left Wo Contrast    (Results Pending)  CT Tibia Fibula Left Wo Contrast    (Results Pending)    Medications  acetaminophen (TYLENOL) tablet 1,000 mg (1,000 mg Oral Given 11/02/2019 2234)  lactated ringers bolus 1,000 mL (0 mLs Intravenous Stopped 10/13/19 0158)    And  lactated ringers bolus 800 mL (0 mLs Intravenous Stopped 10/13/19 0158)  piperacillin-tazobactam (ZOSYN) IVPB 3.375 g (0 g Intravenous Stopped 10/13/19 0103)  clindamycin (CLEOCIN) IVPB 900 mg (0 mg Intravenous Stopped 10/13/19 0103)  vancomycin (VANCOREADY) IVPB 2000 mg/400 mL (0 mg Intravenous Stopped 10/13/19 0356)     Procedures  /  Critical Care .Critical Care Performed by: Maudie Flakes, MD Authorized by: Maudie Flakes, MD   Critical care provider statement:    Critical care time (minutes):  45   Critical care was necessary to treat or prevent imminent or life-threatening deterioration of the following conditions:  Sepsis   Critical care was time spent personally by me on the following activities:  Discussions with consultants, evaluation of patient's response to treatment, examination of patient, ordering and performing treatments and interventions, ordering and review of laboratory studies, ordering and review of radiographic studies, pulse oximetry, re-evaluation of patient's condition, obtaining history from patient or surrogate and review of old charts    ED Course and Medical Decision  Making  I have reviewed the triage vital signs, the nursing notes, and pertinent available records from the EMR.  Listed above are laboratory and imaging tests that I personally ordered, reviewed, and interpreted and then considered in my medical decision making (see below for details).  Concern for wound infection with sepsis, possibly deep space infection, abscess, osteomyelitis, necrotizing fasciitis.  Providing empiric broad-spectrum antibiotic coverage, IV fluids, will obtain CT imaging.     CT is revealing subcutaneous air concerning for gas forming organism causing infection in the foot.  I discussed this with radiology.  Consulting [facility] orthopedics for operative plan, anticipating need for admission to medicine.   Spoke with Dr. Ninfa Linden of Ortho care, who recommends ED to ED transfer to [facility] for evaluation by the orthopedic team to determine operative management.  Accepting ED physician Dr. Christy Gentles for transfer.   Barth Kirks. Sedonia Small, MD [place] [email or web address]  Final Clinical Impressions(s) / ED Diagnoses     ICD-10-CM   1. Sepsis, due to unspecified organism, unspecified whether acute organ dysfunction present ([place])  A41.9   2. Wound infection  T14.8XXA    L08.9     ED Discharge Orders    None       Discharge Instructions Discussed with and Provided to Patient:   Discharge Instructions   None       Maudie Flakes, MD 10/13/19 Pecola Lawless    Maudie Flakes, MD 10/13/19 [phone]

## 2019-10-13 NOTE — Progress Notes (Signed)
Pharmacy Antibiotic Note  Felicia Reynolds is a 39 y.o. female admitted on 10/10/2019 with wound infection. Pharmacy has been consulted for vancomycin and cefepime dosing. Pt is febrile with Tmax 102.5 and WBC is WNL. Scr is elevated at 3.69, Lactic acid is also elevated.   Plan: Continue vancomycin 1500mg  IV Q48H Cefepime 2gm IV Q24H F/u renal fxn, C&S, clinical status and trough at SS  Height: 5\' 6"  (167.6 cm) Weight: 104.3 kg (230 lb) IBW/kg (Calculated) : 59.3  Temp (24hrs), Avg:99.3 F (37.4 C), Min:97.9 F (36.6 C), Max:102.5 F (39.2 C)  Recent Labs  Lab 10/16/2019 2246 10/21/2019 2247 10/13/19 0049 10/13/19 0411  WBC  --  4.1  --   --   CREATININE  --  3.69*  --   --   LATICACIDVEN 3.7*  --  4.5* 3.2*    Estimated Creatinine Clearance: 24.5 mL/min (A) (by C-G formula based on SCr of 3.69 mg/dL (H)).    Allergies  Allergen Reactions  . Hydrocodone Itching  . Latex Itching and Rash    Antimicrobials this admission: Vanc 10/9>> Cefepime 10/9>> Clinda x 1 10/9 Zosyn x 1 10/9  Dose adjustments this admission: N/A  Microbiology results: 10/8 BCx: GNR  Thank you for allowing pharmacy to be a part of this patient's care.  Felicia Reynolds, Felicia Reynolds 10/13/2019 1:06 PM

## 2019-10-13 NOTE — ED Notes (Signed)
CRITICAL VALUE ALERT  Critical Value: Lactic 3.2   Date & Time Notied:  10/13/19 0731  Provider Notified: Dr. Roderic Palau  Orders Received/Actions taken: Transfer

## 2019-10-13 NOTE — Progress Notes (Signed)
Patient ID: Felicia Reynolds, female   DOB: July 07, 1980, 39 y.o.   MRN: [id number] I just saw the patient's left foot at the bedside and communicated with Dr. Sharol Given about this patient.  She has extensive breakdown of her previous surgical site with wound necrosis.  It is malodorous.  There is no evidence of necrotizing fasciitis.  The area that is seen in the soft tissues on the CT scan is due to the fact that the patient has an open wound.  She does need to be admitted to the medicine service for hydration, resuscitation and IV antibiotics.  She will need additional surgery to that left foot in the near future but not emergently today.  Again, Dr. Sharol Given has been spoken to about this patient and he does plan to see her at some point this weekend.  I did tell the patient that she will likely need further operative intervention for that left foot.

## 2019-10-13 NOTE — Plan of Care (Signed)
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
## 2019-10-13 NOTE — ED Notes (Signed)
Pt refuses O2.

## 2019-10-13 NOTE — ED Notes (Signed)
Report called to 5N RN 

## 2019-10-13 NOTE — Progress Notes (Signed)
Pharmacy Antibiotic Note  Felicia Reynolds is a 39 y.o. female admitted on 10/07/2019 with L foot infection .  Pharmacy has been consulted for Vancomycin  Dosing.  Vancomycin 2 g IV given in ED at 0100  Plan: Vancomycin 1500 mg IV q48h F/U renal function  Height: 5\' 6"  (167.6 cm) Weight: 104.3 kg (230 lb) IBW/kg (Calculated) : 59.3  Temp (24hrs), Avg:100.4 F (38 C), Min:98.2 F (36.8 C), Max:102.5 F (39.2 C)  Recent Labs  Lab 10/14/2019 2246 10/24/2019 2247 10/13/19 0049  WBC  --  4.1  --   CREATININE  --  3.69*  --   LATICACIDVEN 3.7*  --  4.5*    Estimated Creatinine Clearance: 24.5 mL/min (A) (by C-G formula based on SCr of 3.69 mg/dL (H)).    Allergies  Allergen Reactions  . Hydrocodone Itching  . Latex Itching and Rash    Caryl Pina 10/13/2019 7:09 AM

## 2019-10-13 NOTE — Progress Notes (Signed)
10/9 Elink Code Sepsis completion note:   BC drawn before pt received ABX. LA has been high x 2. Contacted ED RN x 2 to draw # 2 and a third. Waiting on third result. Received 1800 ml IVF bolus of allotted 3,129 ml per weight. Pt still in ED.   Yajaira Doffing eLink RN

## 2019-10-13 NOTE — ED Provider Notes (Signed)
  Physical Exam  BP 115/61 (BP Location: Left Wrist)   Pulse 72   Temp 98.5 F (36.9 C)   Resp 19   Ht 5\' 6"  (1.676 m)   Wt 104.3 kg   SpO2 96%   BMI 37.12 kg/m   Physical Exam  ED Course/Procedures     Procedures  MDM  Patient transferred from [facility].  Concern for infection of the left foot.  Dr. Ninfa Linden, orthopedic surgery was consulted and requested ED to ED transfer here to evaluate patient at the bedside. Patient in no acute distress and resting comfortably.  9:52 AM Spoke to Dr. Ninfa Linden, states that he was not contacted about this patient and it may have been Dr. Sharol Given.  He will pass the message along to Dr. Sharol Given.  10:07 AM Spoke to Dr. Ninfa Linden again, states that it was actually Dr. Marlou Sa that was contacted about this patient.  Dr. Marlou Sa got in touch with Dr. Sharol Given who will be the consulting orthopedist on this case.  Asked that we admit to medicine due to her lab findings and need for antibiotic therapy.  10:49 AM Per Dr. Trevor Mace note, no evidence of necrotizing fasciitis, patient has an open wound on exam.  She may need surgery in the future but not emergently today.  Will be evaluated by Dr. Sharol Given at the bedside as well. I informed hospitalist about this recommendation as well.    Delia Heady, PA-C 10/13/19 1106    Valarie Merino, MD 10/14/19 [phone]

## 2019-10-13 NOTE — ED Notes (Signed)
Patient is a transfer from [facility] for eval. By vascular surgery. Patient had surgery on her left foot to amp some toes several weeks ago, and now she has a wound on the bottom of her left foot. Alert oriented upon arrival . C/o pain on buttocks dressing applied.

## 2019-10-13 NOTE — ED Notes (Signed)
CRITICAL VALUE ALERT  Critical Value:  Aerobic blood culture gram negative rods  Date & Time Notied:  10/13/2019  Provider Notified: Arby Barrette, RN , [place]  Orders Received/Actions taken: She will notify MD.

## 2019-10-14 ENCOUNTER — Encounter (HOSPITAL_COMMUNITY): Payer: Self-pay | Admitting: Internal Medicine

## 2019-10-14 DIAGNOSIS — L089 Local infection of the skin and subcutaneous tissue, unspecified: Secondary | ICD-10-CM | POA: Diagnosis not present

## 2019-10-14 DIAGNOSIS — E11628 Type 2 diabetes mellitus with other skin complications: Secondary | ICD-10-CM

## 2019-10-14 DIAGNOSIS — M86272 Subacute osteomyelitis, left ankle and foot: Secondary | ICD-10-CM

## 2019-10-14 DIAGNOSIS — A419 Sepsis, unspecified organism: Secondary | ICD-10-CM

## 2019-10-14 DIAGNOSIS — L02612 Cutaneous abscess of left foot: Secondary | ICD-10-CM

## 2019-10-14 LAB — CBC
HCT: 22 % — ABNORMAL LOW (ref 36.0–46.0)
HCT: 23.2 % — ABNORMAL LOW (ref 36.0–46.0)
Hemoglobin: 6.7 g/dL — CL (ref 12.0–15.0)
Hemoglobin: 7.3 g/dL — ABNORMAL LOW (ref 12.0–15.0)
MCH: 24.5 pg — ABNORMAL LOW (ref 26.0–34.0)
MCH: 25.8 pg — ABNORMAL LOW (ref 26.0–34.0)
MCHC: 30.5 g/dL (ref 30.0–36.0)
MCHC: 31.5 g/dL (ref 30.0–36.0)
MCV: 80.3 fL (ref 80.0–100.0)
MCV: 82 fL (ref 80.0–100.0)
Platelets: 171 10*3/uL (ref 150–400)
Platelets: 138 10*3/uL — ABNORMAL LOW (ref 150–400)
RBC: 2.74 MIL/uL — ABNORMAL LOW (ref 3.87–5.11)
RBC: 2.83 MIL/uL — ABNORMAL LOW (ref 3.87–5.11)
RDW: 21.9 % — ABNORMAL HIGH (ref 11.5–15.5)
RDW: 21.4 % — ABNORMAL HIGH (ref 11.5–15.5)
WBC: 16 10*3/uL — ABNORMAL HIGH (ref 4.0–10.5)
WBC: 14.4 10*3/uL — ABNORMAL HIGH (ref 4.0–10.5)
nRBC: 0 % (ref 0.0–0.2)
nRBC: 0.2 % (ref 0.0–0.2)

## 2019-10-14 LAB — BASIC METABOLIC PANEL
Anion gap: 12 (ref 5–15)
BUN: 55 mg/dL — ABNORMAL HIGH (ref 6–20)
CO2: 18 mmol/L — ABNORMAL LOW (ref 22–32)
Calcium: 8.3 mg/dL — ABNORMAL LOW (ref 8.9–10.3)
Chloride: 105 mmol/L (ref 98–111)
Creatinine, Ser: 4.66 mg/dL — ABNORMAL HIGH (ref 0.44–1.00)
GFR, Estimated: 11 mL/min — ABNORMAL LOW (ref >60)
Glucose, Bld: 194 mg/dL — ABNORMAL HIGH (ref 70–99)
Potassium: 4.5 mmol/L (ref 3.5–5.1)
Sodium: 135 mmol/L (ref 135–145)

## 2019-10-14 LAB — BLOOD CULTURE ID PANEL (REFLEXED) - BCID2

## 2019-10-14 LAB — GLUCOSE, CAPILLARY
Glucose-Capillary: 190 mg/dL — ABNORMAL HIGH (ref 70–99)
Glucose-Capillary: 183 mg/dL — ABNORMAL HIGH (ref 70–99)
Glucose-Capillary: 196 mg/dL — ABNORMAL HIGH (ref 70–99)
Glucose-Capillary: 180 mg/dL — ABNORMAL HIGH (ref 70–99)

## 2019-10-14 LAB — PREPARE RBC (CROSSMATCH)

## 2019-10-14 MED ORDER — INSULIN ASPART 100 UNIT/ML ~~LOC~~ SOLN
0.0000 [IU] | Freq: Three times a day (TID) | SUBCUTANEOUS | Status: DC
  Administered 2019-10-14 (×2): 4 [IU] via SUBCUTANEOUS
  Administered 2019-10-15: 3 [IU] via SUBCUTANEOUS
  Administered 2019-10-15: 4 [IU] via SUBCUTANEOUS

## 2019-10-14 MED ORDER — INSULIN GLARGINE 100 UNIT/ML ~~LOC~~ SOLN
40.0000 [IU] | Freq: Every day | SUBCUTANEOUS | Status: DC
  Administered 2019-10-14 – 2019-10-15 (×2): 40 [IU] via SUBCUTANEOUS
  Filled 2019-10-14 (×4): qty 0.4

## 2019-10-14 MED ORDER — ALBUTEROL SULFATE (2.5 MG/3ML) 0.083% IN NEBU: 2.5000 mg | INHALATION_SOLUTION | RESPIRATORY_TRACT | Status: DC | PRN

## 2019-10-14 MED ORDER — CLONAZEPAM 0.5 MG PO TABS: 0.5000 mg | ORAL_TABLET | Freq: Two times a day (BID) | ORAL | Status: DC | PRN

## 2019-10-14 MED ORDER — PREGABALIN 100 MG PO CAPS
200.0000 mg | ORAL_CAPSULE | Freq: Three times a day (TID) | ORAL | Status: DC
  Administered 2019-10-14 – 2019-10-17 (×9): 200 mg via ORAL
  Filled 2019-10-14 (×9): qty 2

## 2019-10-14 MED ORDER — BUPRENORPHINE HCL-NALOXONE HCL 8-2 MG SL FILM: 2.0000 | ORAL_FILM | Freq: Every day | SUBLINGUAL | Status: DC

## 2019-10-14 MED ORDER — INSULIN ASPART 100 UNIT/ML ~~LOC~~ SOLN
6.0000 [IU] | Freq: Three times a day (TID) | SUBCUTANEOUS | Status: DC
  Administered 2019-10-14 – 2019-10-15 (×4): 6 [IU] via SUBCUTANEOUS

## 2019-10-14 MED ORDER — INSULIN ASPART 100 UNIT/ML ~~LOC~~ SOLN: 0.0000 [IU] | Freq: Every day | SUBCUTANEOUS | Status: DC

## 2019-10-14 MED ORDER — HYDROXYZINE HCL 25 MG PO TABS: 50.0000 mg | ORAL_TABLET | Freq: Two times a day (BID) | ORAL | Status: DC | PRN

## 2019-10-14 MED ORDER — ALBUTEROL SULFATE HFA 108 (90 BASE) MCG/ACT IN AERS: 1.0000 | INHALATION_SPRAY | Freq: Four times a day (QID) | RESPIRATORY_TRACT | Status: DC | PRN

## 2019-10-14 MED ORDER — LINACLOTIDE 72 MCG PO CAPS
72.0000 ug | ORAL_CAPSULE | Freq: Every day | ORAL | Status: DC
  Administered 2019-10-15 – 2019-10-16 (×2): 72 ug via ORAL
  Filled 2019-10-14 (×5): qty 1

## 2019-10-14 MED ORDER — DULOXETINE HCL 30 MG PO CPEP
60.0000 mg | ORAL_CAPSULE | Freq: Every day | ORAL | Status: DC
  Administered 2019-10-14 – 2019-10-16 (×3): 60 mg via ORAL
  Filled 2019-10-14 (×3): qty 1

## 2019-10-14 MED ORDER — ROSUVASTATIN CALCIUM 20 MG PO TABS
20.0000 mg | ORAL_TABLET | Freq: Every day | ORAL | Status: DC
  Administered 2019-10-14 – 2019-10-17 (×3): 20 mg via ORAL
  Filled 2019-10-14 (×3): qty 1
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
## 2019-10-14 NOTE — Progress Notes (Signed)
Received and completed 1uPRBC  per order hgb 6.7 without blood transfusion reactions. Has tachycardic episodes however is normal at this point. Remains alert and verbally responsive with depressed moods, provided emotional support.

## 2019-10-14 NOTE — Plan of Care (Signed)
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
## 2019-10-15 ENCOUNTER — Encounter (HOSPITAL_COMMUNITY): Payer: Self-pay | Admitting: Internal Medicine

## 2019-10-15 ENCOUNTER — Other Ambulatory Visit: Payer: Self-pay | Admitting: Physician Assistant

## 2019-10-15 DIAGNOSIS — L089 Local infection of the skin and subcutaneous tissue, unspecified: Secondary | ICD-10-CM | POA: Diagnosis not present

## 2019-10-15 DIAGNOSIS — E11628 Type 2 diabetes mellitus with other skin complications: Secondary | ICD-10-CM | POA: Diagnosis not present

## 2019-10-15 LAB — COMPREHENSIVE METABOLIC PANEL
ALT: 38 U/L (ref 0–44)
AST: 37 U/L (ref 15–41)
Albumin: 1.9 g/dL — ABNORMAL LOW (ref 3.5–5.0)
Alkaline Phosphatase: 265 U/L — ABNORMAL HIGH (ref 38–126)
Anion gap: 12 (ref 5–15)
BUN: 63 mg/dL — ABNORMAL HIGH (ref 6–20)
CO2: 18 mmol/L — ABNORMAL LOW (ref 22–32)
Calcium: 8.4 mg/dL — ABNORMAL LOW (ref 8.9–10.3)
Chloride: 104 mmol/L (ref 98–111)
Creatinine, Ser: 4.68 mg/dL — ABNORMAL HIGH (ref 0.44–1.00)
GFR, Estimated: 11 mL/min — ABNORMAL LOW (ref >60)
Glucose, Bld: 152 mg/dL — ABNORMAL HIGH (ref 70–99)
Potassium: 4.3 mmol/L (ref 3.5–5.1)
Sodium: 134 mmol/L — ABNORMAL LOW (ref 135–145)
Total Bilirubin: 0.8 mg/dL (ref 0.3–1.2)
Total Protein: 6.6 g/dL (ref 6.5–8.1)

## 2019-10-15 LAB — GLUCOSE, CAPILLARY
Glucose-Capillary: 157 mg/dL — ABNORMAL HIGH (ref 70–99)
Glucose-Capillary: 126 mg/dL — ABNORMAL HIGH (ref 70–99)
Glucose-Capillary: 111 mg/dL — ABNORMAL HIGH (ref 70–99)
Glucose-Capillary: 131 mg/dL — ABNORMAL HIGH (ref 70–99)

## 2019-10-15 LAB — CBC
HCT: 26.2 % — ABNORMAL LOW (ref 36.0–46.0)
Hemoglobin: 8.3 g/dL — ABNORMAL LOW (ref 12.0–15.0)
MCH: 26 pg (ref 26.0–34.0)
MCHC: 31.7 g/dL (ref 30.0–36.0)
MCV: 82.1 fL (ref 80.0–100.0)
Platelets: 180 10*3/uL (ref 150–400)
RBC: 3.19 MIL/uL — ABNORMAL LOW (ref 3.87–5.11)
RDW: 22.2 % — ABNORMAL HIGH (ref 11.5–15.5)
WBC: 13.1 10*3/uL — ABNORMAL HIGH (ref 4.0–10.5)
nRBC: 0.2 % (ref 0.0–0.2)

## 2019-10-15 LAB — RAPID URINE DRUG SCREEN, HOSP PERFORMED
Amphetamines: NOT DETECTED
Barbiturates: NOT DETECTED
Benzodiazepines: NOT DETECTED
Cocaine: NOT DETECTED
Opiates: NOT DETECTED
Tetrahydrocannabinol: NOT DETECTED

## 2019-10-15 LAB — CULTURE, BLOOD (ROUTINE X 2)

## 2019-10-15 MED ORDER — LACTATED RINGERS IV SOLN: INTRAVENOUS | Status: AC

## 2019-10-15 MED ORDER — SODIUM CHLORIDE 0.9 % IV SOLN
3.0000 g | Freq: Two times a day (BID) | INTRAVENOUS | Status: DC
  Administered 2019-10-15 – 2019-10-21 (×12): 3 g via INTRAVENOUS
  Filled 2019-10-15 (×4): qty 8
  Filled 2019-10-15: qty 3
  Filled 2019-10-15 (×4): qty 8
  Filled 2019-10-15: qty 3
  Filled 2019-10-15: qty 8
  Filled 2019-10-15: qty 3
  Filled 2019-10-15 (×3): qty 8

## 2019-10-15 MED ORDER — INFLUENZA VAC SPLIT QUAD 0.5 ML IM SUSY
0.5000 mL | PREFILLED_SYRINGE | INTRAMUSCULAR | Status: AC
  Administered 2019-10-18: 0.5 mL via INTRAMUSCULAR
  Filled 2019-10-15: qty 0.5

## 2019-10-15 NOTE — Consult Note (Signed)
[name] Admit Date: 10/29/2019 10/15/2019 Rexene Agent Requesting Physician: Rodena Piety MD  Reason for Consult:  Persistent AKI HPI:  58F admitted 10/8 after presenting to the [facility], ED for dehisced and purulent diabetic foot wound after admission in September of this year with amputation of the left second toe.  During that admission she developed progressive AKI and eventually required dialysis.  She left AMA 9/20, her HD catheter was removed, and she had a creatinine of 4.2 at that time.  Her creatinine at the time of representation was 3.7 and has progressed to 4.7.  She is on vancomycin and Unasyn at the current time.  Orthopedics is planning for left transtibial amputation on 10/13.  Her current course has been complicated by gram-negative bacteremia, E. coli.  She denies using NSAIDs at home.  No contrast exposure since presentation.  Urine output is not recorded.  Potassium is 4.3 bicarbonate of 18.  Appetite is marginal.  No nausea/vomiting.  PMH Incudes:  Poorly controlled DM 2  Chronic narcotic use  Recurrent diabetic foot infections and osteomyelitis  Hypertension  Bipolar disorder   Creatinine, Ser (mg/dL)  Date Value  10/15/2019 4.68 (H)  10/14/2019 4.66 (H)  10/13/2019 4.20 (H)  10/09/2019 3.69 (H)  09/24/2019 4.19 (H)  09/23/2019 4.09 (H)  09/22/2019 5.29 (H)  09/21/2019 5.43 (H)  09/20/2019 6.45 (H)  09/19/2019 6.18 (H)  ]  ROS NSAIDS: Denies use IV Contrast no exposure TMP/SMX no exposure Hypotension not present Balance of 12 systems is negative w/ exceptions as above  PMH  Past Medical History:  Diagnosis Date  . Anxiety   . Arthritis   . Bipolar disorder ([place])   . Diabetes ([place]) 06/05/2015  . Diabetes mellitus   . Fibromyalgia   . History of abnormal cervical Pap smear 05/29/2015  . History of diabetes mellitus, type II 05/29/2015  . Hypertension   . Neuropathy   . Obesity 05/29/2015  . Osteoporosis   . Ovarian cyst 02/20/2016   . Ovarian cyst 02/20/2016   Simple cyst rt ovary  . Panic attacks   . Pneumonia   . Stroke ([place])   . Superficial fungus infection of skin 05/29/2015  . Vitamin D deficiency 06/05/2015   PSH  Past Surgical History:  Procedure Laterality Date  . AMPUTATION Left 09/17/2019   Procedure: AMPUTATION LEFT GREAT TOE/ RAY WITH WOUND VAC APPLICATION;  Surgeon: Leandrew Koyanagi, MD;  Location: [facility];  Service: Orthopedics;  Laterality: Left;  . AMPUTATION Left 09/19/2019   Procedure: LEFT FOOT 1ST RAY AMPUTATION;  Surgeon: Newt Minion, MD;  Location: [facility];  Service: Orthopedics;  Laterality: Left;  . CESAREAN SECTION    . HERNIA REPAIR    . IR FLUORO GUIDE CV LINE RIGHT  09/20/2019  . IR REMOVAL TUN CV CATH W/O FL  09/24/2019  . IR US GUIDE VASC ACCESS RIGHT  09/20/2019  . THROAT SURGERY     FH  Family History  Problem Relation Age of Onset  . Cancer Mother 36       breast  . Breast cancer Mother   . Breast cancer Maternal Aunt   . Breast cancer Maternal Grandmother   . Cancer Maternal Aunt    SH  reports that she has been smoking cigarettes. She has a 6.50 pack-year smoking history. She has never used smokeless tobacco. She reports that she does not drink alcohol and does not use drugs. Allergies  Allergies  Allergen Reactions  . Hydrocodone  Itching  . Latex Itching and Rash   Home medications, these are unclear Prior to Admission medications   Medication Sig Start Date End Date Taking? Authorizing Provider  bismuth subsalicylate (PEPTO BISMOL) 262 MG/15ML suspension Take 30 mLs by mouth daily as needed for indigestion.    [provider]  bisoprolol-hydrochlorothiazide (ZIAC) 5-6.25 MG tablet Take 1 tablet by mouth daily. 06/21/19   [provider]  clonazePAM (KLONOPIN) 0.5 MG tablet Take 0.5 mg by mouth 2 (two) times daily as needed for anxiety. 08/13/19   [provider]  cloNIDine (CATAPRES) 0.2 MG tablet Take 0.2 mg by mouth 2 (two) times daily.    [provider]  clotrimazole-betamethasone (LOTRISONE) cream Apply 1 application topically 2 (two) times daily. 09/26/19   [provider]  diclofenac sodium (VOLTAREN) 1 % GEL Apply 2 g topically 4 (four) times daily as needed (pain).     [provider]  DULoxetine (CYMBALTA) 60 MG capsule Take 60 mg by mouth daily. 06/21/19   [provider]  furosemide (LASIX) 20 MG tablet Take 20 mg by mouth 2 (two) times daily as needed for edema. 04/21/19   [provider]  hydrOXYzine (ATARAX/VISTARIL) 50 MG tablet Take 50 mg by mouth 2 (two) times daily as needed for anxiety. 06/09/19   [provider]  Insulin Glargine (LANTUS SOLOSTAR) 100 UNIT/ML Solostar Pen Inject 80 Units into the skin at bedtime. Patient taking differently: Inject 80 Units into the skin 2 (two) times daily. Inject -80 units under the skin twice daily. 09/15/18   Florian Buff, MD  LINZESS 72 MCG capsule Take 72 mcg by mouth daily. 04/21/19   [provider]  lisinopril-hydrochlorothiazide (ZESTORETIC) 20-12.5 MG tablet Take 1 tablet by mouth daily. 04/21/19   [provider]  megestrol (MEGACE) 40 MG tablet Take 1 tablet (40 mg total) by mouth 3 (three) times daily as needed (regulate period). Patient not taking: Reported on 09/13/2019 12/05/18   Florian Buff, MD  NOVOLOG FLEXPEN 100 UNIT/ML FlexPen INJECT 30 UNITS SUBCUTANEOUSLY 3 TIMES A DAY WITH MEALS Patient not taking: Reported on 09/13/2019 09/07/19   Florian Buff, MD  oxyCODONE-acetaminophen (PERCOCET/ROXICET) 5-325 MG tablet Take 1 tablet by mouth every 6 (six) hours as needed for pain. 09/25/19   [provider]  pregabalin (LYRICA) 200 MG capsule Take 200 mg by mouth 3 (three) times daily.     [provider]  PROAIR HFA 108 (90 Base) MCG/ACT inhaler Inhale 1 puff into the lungs 4 (four) times daily as needed for wheezing or shortness of breath. 08/15/19   [provider]  rosuvastatin (CRESTOR)  20 MG tablet Take 20 mg by mouth at bedtime. 06/21/19   [provider]  silver sulfADIAZINE (SILVADENE) 1 % cream APPLY TO AFFECTED AREA 2 OR 3 TIMES DAILY Patient taking differently: Apply 1 application topically 3 (three) times daily. APPLY TO AFFECTED AREA 2 OR 3 TIMES DAILY 03/30/19   Florian Buff, MD  SUBOXONE 8-2 MG FILM Place 2 Film under the tongue daily.  11/17/18   [provider]  torsemide (DEMADEX) 10 MG tablet Take 10 mg by mouth daily. 06/21/19   [provider]    Current Medications Scheduled Meds: . sodium chloride   Intravenous Once  . DULoxetine  60 mg Oral Daily  . heparin  5,000 Units Subcutaneous Q8H  . [START ON 10/16/2019] influenza vac split quadrivalent PF  0.5 mL Intramuscular Tomorrow-1000  . insulin  aspart  0-20 Units Subcutaneous TID WC  . insulin aspart  0-5 Units Subcutaneous QHS  . insulin aspart  6 Units Subcutaneous TID WC  . insulin glargine  40 Units Subcutaneous QHS  . linaclotide  72 mcg Oral Daily  . pregabalin  200 mg Oral TID  . rosuvastatin  20 mg Oral QHS  . sodium chloride flush  3 mL Intravenous Q12H   Continuous Infusions: . sodium chloride    . ampicillin-sulbactam (UNASYN) IV 3 g (10/15/19 1200)   PRN Meds:.sodium chloride, acetaminophen **OR** acetaminophen, albuterol, clonazePAM, hydrOXYzine, ondansetron **OR** ondansetron (ZOFRAN) IV, oxyCODONE, polyethylene glycol, sodium chloride flush, sorbitol  CBC Recent Labs  Lab 10/25/2019 2247 10/05/2019 2247 10/13/19 1558 10/14/19 0637 10/15/19 0704  WBC 4.1   < > 16.0* 14.4* 13.1*  NEUTROABS 3.6  --   --   --   --   HGB 7.8*   < > 6.7* 7.3* 8.3*  HCT 26.0*   < > 22.0* 23.2* 26.2*  MCV 80.5   < > 80.3 82.0 82.1  PLT 210   < > 171 138* 180   < > = values in this interval not displayed.   Basic Metabolic Panel Recent Labs  Lab 10/05/2019 2247 10/13/19 1558 10/14/19 0637 10/15/19 0704  NA 135  --  135 134*  K 4.2  --  4.5 4.3  CL 104  --  105 104  CO2  18*  --  18* 18*  GLUCOSE 169*  --  194* 152*  BUN 48*  --  55* 63*  CREATININE 3.69* 4.20* 4.66* 4.68*  CALCIUM 8.5*  --  8.3* 8.4*    Physical Exam  Blood pressure 134/73, pulse 86, temperature 98.5 F (36.9 C), temperature source Oral, resp. rate 18, height 5\' 6"  (1.676 m), weight 104.3 kg, SpO2 100 %. GEN: NAD, chronically ill-appearing, resting  ENT: NCAT EYES: EOMI CV: Regular, normal S1 and S2 PULM: Clear breath sounds bilaterally ABD: Soft, nontender SKIN: Left foot bandaged, not examined EXT: No significant edema NEURO: Nonfocal  Assessment 65F with evidence of CKD 3 until presentation in September for diabetic foot ulcer requiring toe amputation and progressive AKI eventually requiring dialysis; she left 9/20 AMA and returns with a creatinine very similar to at the time of discharge.  Since admission her creatinine has worsened to 4.7.  1. Prolonged AKI, no immediate indication to resume dialysis; hopeful with supportive care and treatment of her bacteremia and foot ulcer she will have further improvement in GFR. 2. E. coli bacteremia, per primary related to #3 3. Diabetic foot ulcer, dehisced left toe amputation site 4. DM2, uncontrolled 5. Chronic pain 6. Hypertension, BP stable 7. Anemia, chronic, multifactorial; asymptomatic 8. Metabolic acidosis  Plan 1. Continue supportive care, will give LR at 50 mL/h for the next 24 hours 2. High risk of developing recurrent dialysis dependence, not currently indicated 3. Daily weights, Daily Renal Panel, Strict I/Os, Avoid nephrotoxins (NSAIDs, judicious IV Contrast)    Rexene Agent  10/15/2019, 4:40 PM
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
## 2019-10-15 NOTE — Progress Notes (Signed)
Patient ID: Felicia Reynolds, female   DOB: March 22, 1980, 39 y.o.   MRN: [id number] Reviewed patient's medical condition with the abscess and dehiscence of her left foot status post first ray amputation.  Patient states that her wound dehiscence was secondary to her not having assistance at home and having to care for herself with ambulation and activities of daily living.  Patient is agreeable to proceeding with a left transtibial amputation on Wednesday.  Patient will need discharge to skilled nursing, she does not have a supportive home environment.

## 2019-10-16 DIAGNOSIS — E11628 Type 2 diabetes mellitus with other skin complications: Secondary | ICD-10-CM | POA: Diagnosis not present

## 2019-10-16 DIAGNOSIS — L089 Local infection of the skin and subcutaneous tissue, unspecified: Secondary | ICD-10-CM | POA: Diagnosis not present

## 2019-10-16 LAB — CBC
HCT: 23 % — ABNORMAL LOW (ref 36.0–46.0)
Hemoglobin: 7.4 g/dL — ABNORMAL LOW (ref 12.0–15.0)
MCH: 26.4 pg (ref 26.0–34.0)
MCHC: 32.2 g/dL (ref 30.0–36.0)
MCV: 82.1 fL (ref 80.0–100.0)
Platelets: 167 10*3/uL (ref 150–400)
RBC: 2.8 MIL/uL — ABNORMAL LOW (ref 3.87–5.11)
RDW: 22.4 % — ABNORMAL HIGH (ref 11.5–15.5)
WBC: 14.3 10*3/uL — ABNORMAL HIGH (ref 4.0–10.5)
nRBC: 0.1 % (ref 0.0–0.2)

## 2019-10-16 LAB — COMPREHENSIVE METABOLIC PANEL
ALT: 25 U/L (ref 0–44)
AST: 24 U/L (ref 15–41)
Albumin: 1.6 g/dL — ABNORMAL LOW (ref 3.5–5.0)
Alkaline Phosphatase: 329 U/L — ABNORMAL HIGH (ref 38–126)
Anion gap: 14 (ref 5–15)
BUN: 66 mg/dL — ABNORMAL HIGH (ref 6–20)
CO2: 16 mmol/L — ABNORMAL LOW (ref 22–32)
Calcium: 8.1 mg/dL — ABNORMAL LOW (ref 8.9–10.3)
Chloride: 105 mmol/L (ref 98–111)
Creatinine, Ser: 4.31 mg/dL — ABNORMAL HIGH (ref 0.44–1.00)
GFR, Estimated: 12 mL/min — ABNORMAL LOW (ref >60)
Glucose, Bld: 100 mg/dL — ABNORMAL HIGH (ref 70–99)
Potassium: 4.4 mmol/L (ref 3.5–5.1)
Sodium: 135 mmol/L (ref 135–145)
Total Bilirubin: 1 mg/dL (ref 0.3–1.2)
Total Protein: 5.8 g/dL — ABNORMAL LOW (ref 6.5–8.1)

## 2019-10-16 LAB — HEMOGLOBIN AND HEMATOCRIT, BLOOD
HCT: 26.4 % — ABNORMAL LOW (ref 36.0–46.0)
Hemoglobin: 8.4 g/dL — ABNORMAL LOW (ref 12.0–15.0)

## 2019-10-16 LAB — GLUCOSE, CAPILLARY
Glucose-Capillary: 88 mg/dL (ref 70–99)
Glucose-Capillary: 110 mg/dL — ABNORMAL HIGH (ref 70–99)
Glucose-Capillary: 75 mg/dL (ref 70–99)
Glucose-Capillary: 81 mg/dL (ref 70–99)

## 2019-10-16 LAB — PREPARE RBC (CROSSMATCH)

## 2019-10-16 MED ORDER — SODIUM CHLORIDE 0.9% IV SOLUTION: Freq: Once | INTRAVENOUS | Status: DC

## 2019-10-16 NOTE — Progress Notes (Signed)
Admit: 10/30/2019 LOS: 3  25F with evidence of CKD 3 until presentation in September for diabetic foot ulcer requiring toe amputation and progressive AKI eventually requiring dialysis; she left 9/20 AMA and returns with a creatinine very similar to at the time of discharge.  Since admission her creatinine has worsened to 4.7.   Subjective:  . NAE . Poor PO . For BKA tomorrow  . SCr improved to 4.3, K 4.4  10/11 0701 - 10/12 0700 In: 794.3 [P.O.:120; I.V.:464.4; IV Piggyback:209.9] Out: 300 [Urine:300]  Filed Weights   10/26/2019 2224  Weight: 104.3 kg    Scheduled Meds: . sodium chloride   Intravenous Once  . sodium chloride   Intravenous Once  . DULoxetine  60 mg Oral Daily  . heparin  5,000 Units Subcutaneous Q8H  . influenza vac split quadrivalent PF  0.5 mL Intramuscular Tomorrow-1000  . insulin aspart  0-20 Units Subcutaneous TID WC  . insulin aspart  0-5 Units Subcutaneous QHS  . insulin aspart  6 Units Subcutaneous TID WC  . insulin glargine  40 Units Subcutaneous QHS  . linaclotide  72 mcg Oral Daily  . pregabalin  200 mg Oral TID  . rosuvastatin  20 mg Oral QHS  . sodium chloride flush  3 mL Intravenous Q12H   Continuous Infusions: . sodium chloride    . ampicillin-sulbactam (UNASYN) IV Stopped (10/16/19 3762)  . lactated ringers Stopped (10/16/19 0956)   PRN Meds:.sodium chloride, acetaminophen **OR** acetaminophen, albuterol, clonazePAM, hydrOXYzine, ondansetron **OR** ondansetron (ZOFRAN) IV, oxyCODONE, polyethylene glycol, sodium chloride flush, sorbitol  Current Labs: reviewed    Physical Exam:  Blood pressure 135/84, pulse 87, temperature 98.1 F (36.7 C), temperature source Axillary, resp. rate 16, height 5\' 6"  (1.676 m), weight 104.3 kg, SpO2 97 %. GEN: NAD, chronically ill-appearing, resting  ENT: NCAT EYES: EOMI CV: Regular, normal S1 and S2 PULM: Clear breath sounds bilaterally ABD: Soft, nontender SKIN: Left foot bandaged, not examined EXT: No  significant edema NEURO: Nonfocal  A 1. Prolonged AKI, no immediate indication to resume dialysis; hopeful with supportive care and treatment of her bacteremia and foot ulcer she will have further improvement in GFR. 2. E. coli bacteremia, per primary related to #3, unasyn 3. Diabetic foot ulcer, dehisced left toe amputation site 4. DM2, uncontrolled 5. Chronic pain 6. Hypertension, BP stable 7. Anemia, chronic, multifactorial; asymptomatic 8. Metabolic acidosis  P 1. Continue supportive care 2. High risk of developing recurrent dialysis dependence, not currently indicated 3. Daily weights, Daily Renal Panel, Strict I/Os, Avoid nephrotoxins (NSAIDs, judicious IV Contrast)   . Medication Issues; o Preferred narcotic agents for pain control are hydromorphone, fentanyl, and methadone. Morphine should not be used.  o Baclofen should be avoided o Avoid oral sodium phosphate and magnesium citrate based laxatives / bowel preps    Pearson Grippe MD 10/16/2019, 1:55 PM  Recent Labs  Lab 10/14/19 0637 10/15/19 0704 10/16/19 0424  NA 135 134* 135  K 4.5 4.3 4.4  CL 105 104 105  CO2 18* 18* 16*  GLUCOSE 194* 152* 100*  BUN 55* 63* 66*  CREATININE 4.66* 4.68* 4.31*  CALCIUM 8.3* 8.4* 8.1*   Recent Labs  Lab 10/25/2019 2247 10/13/19 1558 10/14/19 0637 10/15/19 0704 10/16/19 0424  WBC 4.1   < > 14.4* 13.1* 14.3*  NEUTROABS 3.6  --   --   --   --   HGB 7.8*   < > 7.3* 8.3* 7.4*  HCT 26.0*   < > 23.2*  26.2* 23.0*  MCV 80.5   < > 82.0 82.1 82.1  PLT 210   < > 138* 180 167   < > = values in this interval not displayed.
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
## 2019-10-16 NOTE — Progress Notes (Signed)
Patient is sleepy but easily arousable.  Lying in bed.   Vital signs stable.  Patient he did have a discussion with Dr. Sharol Given.  Plan for left below-knee amputation tomorrow.  Should be n.p.o. after midnight.  Preoperative orders are signed and held

## 2019-10-17 ENCOUNTER — Encounter (HOSPITAL_COMMUNITY): Payer: Self-pay | Admitting: Internal Medicine

## 2019-10-17 ENCOUNTER — Inpatient Hospital Stay (HOSPITAL_COMMUNITY): Admission: EM | Disposition: E | Payer: Self-pay | Source: Home / Self Care | Attending: Pulmonary Disease

## 2019-10-17 ENCOUNTER — Inpatient Hospital Stay (HOSPITAL_COMMUNITY): Payer: Medicaid Other | Admitting: Certified Registered"

## 2019-10-17 ENCOUNTER — Inpatient Hospital Stay (HOSPITAL_COMMUNITY): Payer: Medicaid Other

## 2019-10-17 DIAGNOSIS — A4151 Sepsis due to Escherichia coli [E. coli]: Secondary | ICD-10-CM | POA: Diagnosis not present

## 2019-10-17 DIAGNOSIS — I469 Cardiac arrest, cause unspecified: Secondary | ICD-10-CM | POA: Diagnosis not present

## 2019-10-17 DIAGNOSIS — E11628 Type 2 diabetes mellitus with other skin complications: Secondary | ICD-10-CM | POA: Diagnosis not present

## 2019-10-17 DIAGNOSIS — E1142 Type 2 diabetes mellitus with diabetic polyneuropathy: Secondary | ICD-10-CM

## 2019-10-17 DIAGNOSIS — N179 Acute kidney failure, unspecified: Secondary | ICD-10-CM | POA: Diagnosis not present

## 2019-10-17 DIAGNOSIS — A419 Sepsis, unspecified organism: Secondary | ICD-10-CM | POA: Diagnosis not present

## 2019-10-17 DIAGNOSIS — Z23 Encounter for immunization: Secondary | ICD-10-CM | POA: Diagnosis not present

## 2019-10-17 DIAGNOSIS — E1165 Type 2 diabetes mellitus with hyperglycemia: Secondary | ICD-10-CM

## 2019-10-17 DIAGNOSIS — Z20822 Contact with and (suspected) exposure to covid-19: Secondary | ICD-10-CM | POA: Diagnosis not present

## 2019-10-17 DIAGNOSIS — R579 Shock, unspecified: Secondary | ICD-10-CM

## 2019-10-17 DIAGNOSIS — E43 Unspecified severe protein-calorie malnutrition: Secondary | ICD-10-CM | POA: Diagnosis not present

## 2019-10-17 DIAGNOSIS — M86272 Subacute osteomyelitis, left ankle and foot: Secondary | ICD-10-CM | POA: Diagnosis not present

## 2019-10-17 DIAGNOSIS — L02612 Cutaneous abscess of left foot: Secondary | ICD-10-CM | POA: Diagnosis not present

## 2019-10-17 DIAGNOSIS — L089 Local infection of the skin and subcutaneous tissue, unspecified: Secondary | ICD-10-CM | POA: Diagnosis not present

## 2019-10-17 HISTORY — PX: AMPUTATION: SHX166

## 2019-10-17 LAB — CULTURE, BLOOD (ROUTINE X 2): Culture: NO GROWTH

## 2019-10-17 LAB — GLUCOSE, CAPILLARY
Glucose-Capillary: 70 mg/dL (ref 70–99)
Glucose-Capillary: 71 mg/dL (ref 70–99)
Glucose-Capillary: 87 mg/dL (ref 70–99)
Glucose-Capillary: 72 mg/dL (ref 70–99)
Glucose-Capillary: 93 mg/dL (ref 70–99)
Glucose-Capillary: 120 mg/dL — ABNORMAL HIGH (ref 70–99)

## 2019-10-17 LAB — POCT I-STAT 7, (LYTES, BLD GAS, ICA,H+H)
Acid-base deficit: 5 mmol/L — ABNORMAL HIGH (ref 0.0–2.0)
Bicarbonate: 18.7 mmol/L — ABNORMAL LOW (ref 20.0–28.0)
Calcium, Ion: 1.09 mmol/L — ABNORMAL LOW (ref 1.15–1.40)
HCT: 25 % — ABNORMAL LOW (ref 36.0–46.0)
Hemoglobin: 8.5 g/dL — ABNORMAL LOW (ref 12.0–15.0)
O2 Saturation: 99 %
Patient temperature: 98.8
Potassium: 4.6 mmol/L (ref 3.5–5.1)
Sodium: 139 mmol/L (ref 135–145)
TCO2: 20 mmol/L — ABNORMAL LOW (ref 22–32)
pCO2 arterial: 30 mmHg — ABNORMAL LOW (ref 32.0–48.0)
pH, Arterial: 7.402 (ref 7.350–7.450)
pO2, Arterial: 151 mmHg — ABNORMAL HIGH (ref 83.0–108.0)

## 2019-10-17 LAB — TYPE AND SCREEN
ABO/RH(D): O POS
Antibody Screen: NEGATIVE
Unit division: 0
Unit division: 0

## 2019-10-17 LAB — COMPREHENSIVE METABOLIC PANEL
ALT: 19 U/L (ref 0–44)
AST: 14 U/L — ABNORMAL LOW (ref 15–41)
Albumin: 1.5 g/dL — ABNORMAL LOW (ref 3.5–5.0)
Alkaline Phosphatase: 322 U/L — ABNORMAL HIGH (ref 38–126)
Anion gap: 9 (ref 5–15)
BUN: 61 mg/dL — ABNORMAL HIGH (ref 6–20)
CO2: 20 mmol/L — ABNORMAL LOW (ref 22–32)
Calcium: 8.2 mg/dL — ABNORMAL LOW (ref 8.9–10.3)
Chloride: 106 mmol/L (ref 98–111)
Creatinine, Ser: 3.91 mg/dL — ABNORMAL HIGH (ref 0.44–1.00)
GFR, Estimated: 13 mL/min — ABNORMAL LOW (ref >60)
Glucose, Bld: 90 mg/dL (ref 70–99)
Potassium: 4.3 mmol/L (ref 3.5–5.1)
Sodium: 135 mmol/L (ref 135–145)
Total Bilirubin: 0.6 mg/dL (ref 0.3–1.2)
Total Protein: 6.1 g/dL — ABNORMAL LOW (ref 6.5–8.1)

## 2019-10-17 LAB — BPAM RBC
Blood Product Expiration Date: 202111052359
Blood Product Expiration Date: 202111142359
ISSUE DATE / TIME: 202110092234
ISSUE DATE / TIME: 202110121532
Unit Type and Rh: 5100
Unit Type and Rh: 5100

## 2019-10-17 LAB — CBC
HCT: 25.4 % — ABNORMAL LOW (ref 36.0–46.0)
Hemoglobin: 8.1 g/dL — ABNORMAL LOW (ref 12.0–15.0)
MCH: 26 pg (ref 26.0–34.0)
MCHC: 31.9 g/dL (ref 30.0–36.0)
MCV: 81.4 fL (ref 80.0–100.0)
Platelets: 170 10*3/uL (ref 150–400)
RBC: 3.12 MIL/uL — ABNORMAL LOW (ref 3.87–5.11)
RDW: 22.1 % — ABNORMAL HIGH (ref 11.5–15.5)
WBC: 21.9 10*3/uL — ABNORMAL HIGH (ref 4.0–10.5)
nRBC: 0.1 % (ref 0.0–0.2)

## 2019-10-17 LAB — BLOOD GAS, ARTERIAL
Acid-base deficit: 17.6 mmol/L — ABNORMAL HIGH (ref 0.0–2.0)
Bicarbonate: 14.4 mmol/L — ABNORMAL LOW (ref 20.0–28.0)
Drawn by: 54887
FIO2: 100
O2 Saturation: 91.3 %
Patient temperature: 37
pCO2 arterial: 92.7 mmHg (ref 32.0–48.0)
pH, Arterial: 6.824 — CL (ref 7.350–7.450)
pO2, Arterial: 103 mmHg (ref 83.0–108.0)

## 2019-10-17 LAB — MRSA PCR SCREENING: MRSA by PCR: NEGATIVE

## 2019-10-17 LAB — HCG, SERUM, QUALITATIVE: Preg, Serum: NEGATIVE

## 2019-10-17 SURGERY — AMPUTATION BELOW KNEE
Anesthesia: General | Site: Knee | Laterality: Left

## 2019-10-17 MED ORDER — PANTOPRAZOLE SODIUM 40 MG IV SOLR
40.0000 mg | INTRAVENOUS | Status: DC
  Administered 2019-10-17 – 2019-10-21 (×5): 40 mg via INTRAVENOUS
  Filled 2019-10-17 (×5): qty 40

## 2019-10-17 MED ORDER — LIDOCAINE 2% (20 MG/ML) 5 ML SYRINGE
INTRAMUSCULAR | Status: AC
  Filled 2019-10-17: qty 5

## 2019-10-17 MED ORDER — DEXTROSE 50 % IV SOLN
25.0000 mL | Freq: Once | INTRAVENOUS | Status: AC
  Administered 2019-10-17: 25 mL via INTRAVENOUS

## 2019-10-17 MED ORDER — SODIUM CHLORIDE 0.9 % IV SOLN: INTRAVENOUS | Status: DC

## 2019-10-17 MED ORDER — LIDOCAINE-EPINEPHRINE (PF) 1.5 %-1:200000 IJ SOLN
INTRAMUSCULAR | Status: DC | PRN
  Administered 2019-10-17: 30 mL via PERINEURAL

## 2019-10-17 MED ORDER — ORAL CARE MOUTH RINSE
15.0000 mL | OROMUCOSAL | Status: DC
  Administered 2019-10-17 – 2019-10-21 (×37): 15 mL via OROMUCOSAL

## 2019-10-17 MED ORDER — CHLORHEXIDINE GLUCONATE 0.12% ORAL RINSE (MEDLINE KIT)
15.0000 mL | Freq: Two times a day (BID) | OROMUCOSAL | Status: DC
  Administered 2019-10-17 – 2019-10-21 (×9): 15 mL via OROMUCOSAL

## 2019-10-17 MED ORDER — CEFAZOLIN SODIUM-DEXTROSE 2-4 GM/100ML-% IV SOLN
2.0000 g | INTRAVENOUS | Status: AC
  Administered 2019-10-17: 2 g via INTRAVENOUS
  Filled 2019-10-17: qty 100

## 2019-10-17 MED ORDER — BUPIVACAINE-EPINEPHRINE (PF) 0.5% -1:200000 IJ SOLN
INTRAMUSCULAR | Status: DC | PRN
  Administered 2019-10-17: 30 mL via PERINEURAL

## 2019-10-17 MED ORDER — ACETAMINOPHEN 325 MG PO TABS
650.0000 mg | ORAL_TABLET | Freq: Four times a day (QID) | ORAL | Status: DC
  Administered 2019-10-17: 650 mg via ORAL
  Filled 2019-10-17: qty 2

## 2019-10-17 MED ORDER — FENTANYL CITRATE (PF) 250 MCG/5ML IJ SOLN
INTRAMUSCULAR | Status: AC
  Filled 2019-10-17: qty 5

## 2019-10-17 MED ORDER — NOREPINEPHRINE 4 MG/250ML-% IV SOLN
0.0000 ug/min | INTRAVENOUS | Status: DC
  Administered 2019-10-17: 5 ug/min via INTRAVENOUS

## 2019-10-17 MED ORDER — ONDANSETRON HCL 4 MG/2ML IJ SOLN
INTRAMUSCULAR | Status: AC
  Filled 2019-10-17: qty 2

## 2019-10-17 MED ORDER — FENTANYL CITRATE (PF) 100 MCG/2ML IJ SOLN
INTRAMUSCULAR | Status: AC
  Administered 2019-10-17: 50 ug via INTRAVENOUS
  Filled 2019-10-17: qty 2

## 2019-10-17 MED ORDER — PREGABALIN 75 MG PO CAPS: 75.0000 mg | ORAL_CAPSULE | Freq: Two times a day (BID) | ORAL | Status: DC

## 2019-10-17 MED ORDER — NOREPINEPHRINE 4 MG/250ML-% IV SOLN
INTRAVENOUS | Status: AC
  Filled 2019-10-17: qty 250

## 2019-10-17 MED ORDER — SODIUM BICARBONATE 8.4 % IV SOLN
INTRAVENOUS | Status: AC
  Filled 2019-10-17: qty 100

## 2019-10-17 MED ORDER — MIDAZOLAM HCL 2 MG/2ML IJ SOLN
INTRAMUSCULAR | Status: AC
  Filled 2019-10-17: qty 2

## 2019-10-17 MED ORDER — SODIUM BICARBONATE 8.4 % IV SOLN
100.0000 meq | Freq: Once | INTRAVENOUS | Status: AC
  Administered 2019-10-17: 100 meq via INTRAVENOUS

## 2019-10-17 MED ORDER — INSULIN ASPART 100 UNIT/ML ~~LOC~~ SOLN
0.0000 [IU] | SUBCUTANEOUS | Status: DC
  Administered 2019-10-18 (×2): 1 [IU] via SUBCUTANEOUS
  Administered 2019-10-19 (×2): 3 [IU] via SUBCUTANEOUS
  Administered 2019-10-19: 5 [IU] via SUBCUTANEOUS
  Administered 2019-10-19: 3 [IU] via SUBCUTANEOUS
  Administered 2019-10-19 (×2): 2 [IU] via SUBCUTANEOUS
  Administered 2019-10-20 (×4): 5 [IU] via SUBCUTANEOUS
  Administered 2019-10-20: 7 [IU] via SUBCUTANEOUS
  Administered 2019-10-20 – 2019-10-21 (×3): 5 [IU] via SUBCUTANEOUS
  Administered 2019-10-21: 3 [IU] via SUBCUTANEOUS

## 2019-10-17 MED ORDER — CHLORHEXIDINE GLUCONATE 0.12 % MT SOLN
OROMUCOSAL | Status: AC
  Administered 2019-10-17: 15 mL via OROMUCOSAL
  Filled 2019-10-17: qty 15

## 2019-10-17 MED ORDER — PROPOFOL 10 MG/ML IV BOLUS
INTRAVENOUS | Status: AC
  Filled 2019-10-17: qty 20

## 2019-10-17 MED ORDER — MIDAZOLAM HCL 2 MG/2ML IJ SOLN
INTRAMUSCULAR | Status: AC
  Administered 2019-10-17: 2 mg via INTRAVENOUS
  Filled 2019-10-17: qty 2

## 2019-10-17 MED ORDER — MIDAZOLAM HCL 2 MG/2ML IJ SOLN: 2.0000 mg | Freq: Once | INTRAMUSCULAR | Status: AC

## 2019-10-17 MED ORDER — FENTANYL CITRATE (PF) 100 MCG/2ML IJ SOLN: 50.0000 ug | Freq: Once | INTRAMUSCULAR | Status: AC

## 2019-10-17 MED ORDER — CHLORHEXIDINE GLUCONATE CLOTH 2 % EX PADS
6.0000 | MEDICATED_PAD | Freq: Every day | CUTANEOUS | Status: DC
  Administered 2019-10-17 – 2019-10-21 (×5): 6 via TOPICAL

## 2019-10-17 MED ORDER — CHLORHEXIDINE GLUCONATE 0.12 % MT SOLN
15.0000 mL | OROMUCOSAL | Status: AC
  Filled 2019-10-17: qty 15

## 2019-10-17 MED ORDER — DEXTROSE 50 % IV SOLN
INTRAVENOUS | Status: AC
  Filled 2019-10-17: qty 50

## 2019-10-17 MED ORDER — ONDANSETRON HCL 4 MG/2ML IJ SOLN
INTRAMUSCULAR | Status: DC | PRN
  Administered 2019-10-17: 4 mg via INTRAVENOUS

## 2019-10-17 MED ORDER — HYDROMORPHONE HCL 1 MG/ML IJ SOLN
0.5000 mg | INTRAMUSCULAR | Status: DC | PRN
  Administered 2019-10-17: 1 mg via INTRAVENOUS
  Filled 2019-10-17: qty 1

## 2019-10-17 MED ORDER — STERILE WATER FOR INJECTION IV SOLN
INTRAVENOUS | Status: DC
  Filled 2019-10-17 (×4): qty 850

## 2019-10-17 MED ORDER — LIDOCAINE 2% (20 MG/ML) 5 ML SYRINGE
INTRAMUSCULAR | Status: DC | PRN
  Administered 2019-10-17: 100 mg via INTRAVENOUS

## 2019-10-17 MED ORDER — PROPOFOL 10 MG/ML IV BOLUS
INTRAVENOUS | Status: DC | PRN
  Administered 2019-10-17: 120 mg via INTRAVENOUS

## 2019-10-17 MED ORDER — ONDANSETRON HCL 4 MG/2ML IJ SOLN: 4.0000 mg | Freq: Once | INTRAMUSCULAR | Status: DC | PRN

## 2019-10-17 MED ORDER — ORAL CARE MOUTH RINSE: 15.0000 mL | OROMUCOSAL | Status: DC

## 2019-10-17 MED ORDER — MEPERIDINE HCL 25 MG/ML IJ SOLN: 6.2500 mg | INTRAMUSCULAR | Status: DC | PRN

## 2019-10-17 MED ORDER — HYDROMORPHONE HCL 1 MG/ML IJ SOLN: 0.2500 mg | INTRAMUSCULAR | Status: DC | PRN

## 2019-10-17 MED ORDER — CHLORHEXIDINE GLUCONATE 0.12% ORAL RINSE (MEDLINE KIT): 15.0000 mL | Freq: Two times a day (BID) | OROMUCOSAL | Status: DC

## 2019-10-17 MED ORDER — 0.9 % SODIUM CHLORIDE (POUR BTL) OPTIME
TOPICAL | Status: DC | PRN
  Administered 2019-10-17: 1000 mL

## 2019-10-17 MED FILL — Medication: Qty: 1 | Status: AC

## 2019-10-17 SURGICAL SUPPLY — 40 items
BLADE SAW RECIP 87.9 MT (BLADE) ×3 IMPLANT
BLADE SURG 21 STRL SS (BLADE) ×3 IMPLANT
BNDG COHESIVE 4X5 TAN STRL (GAUZE/BANDAGES/DRESSINGS) ×2 IMPLANT
BNDG COHESIVE 6X5 TAN STRL LF (GAUZE/BANDAGES/DRESSINGS) IMPLANT
CANISTER WOUND CARE 500ML ATS (WOUND CARE) ×3 IMPLANT
COVER SURGICAL LIGHT HANDLE (MISCELLANEOUS) ×3 IMPLANT
COVER WAND RF STERILE (DRAPES) IMPLANT
CUFF TOURN SGL QUICK 34 (TOURNIQUET CUFF) ×3
CUFF TRNQT CYL 34X4.125X (TOURNIQUET CUFF) ×1 IMPLANT
DRAPE INCISE IOBAN 66X45 STRL (DRAPES) ×3 IMPLANT
DRAPE U-SHAPE 47X51 STRL (DRAPES) ×3 IMPLANT
DRESSING PREVENA PLUS CUSTOM (GAUZE/BANDAGES/DRESSINGS) ×1 IMPLANT
DRSG PREVENA PLUS CUSTOM (GAUZE/BANDAGES/DRESSINGS) ×3
DURAPREP 26ML APPLICATOR (WOUND CARE) ×3 IMPLANT
ELECT REM PT RETURN 9FT ADLT (ELECTROSURGICAL) ×3
ELECTRODE REM PT RTRN 9FT ADLT (ELECTROSURGICAL) ×1 IMPLANT
GLOVE BIOGEL PI IND STRL 9 (GLOVE) ×1 IMPLANT
GLOVE BIOGEL PI INDICATOR 9 (GLOVE) ×2
GLOVE SURG ORTHO 9.0 STRL STRW (GLOVE) ×3 IMPLANT
GOWN STRL REUS W/ TWL XL LVL3 (GOWN DISPOSABLE) ×2 IMPLANT
GOWN STRL REUS W/TWL XL LVL3 (GOWN DISPOSABLE) ×6
KIT BASIN OR (CUSTOM PROCEDURE TRAY) ×3 IMPLANT
KIT TURNOVER KIT B (KITS) ×3 IMPLANT
MANIFOLD NEPTUNE II (INSTRUMENTS) ×3 IMPLANT
NS IRRIG 1000ML POUR BTL (IV SOLUTION) ×3 IMPLANT
PACK ORTHO EXTREMITY (CUSTOM PROCEDURE TRAY) ×3 IMPLANT
PAD ARMBOARD 7.5X6 YLW CONV (MISCELLANEOUS) ×3 IMPLANT
PREVENA RESTOR ARTHOFORM 46X30 (CANNISTER) ×3 IMPLANT
PREVENA RESTOR AXIOFORM 29X28 (GAUZE/BANDAGES/DRESSINGS) ×2 IMPLANT
SPONGE LAP 18X18 RF (DISPOSABLE) IMPLANT
STAPLER VISISTAT 35W (STAPLE) IMPLANT
STOCKINETTE IMPERVIOUS LG (DRAPES) ×3 IMPLANT
SUT ETHILON 2 0 PSLX (SUTURE) IMPLANT
SUT SILK 2 0 (SUTURE) ×3
SUT SILK 2-0 18XBRD TIE 12 (SUTURE) ×1 IMPLANT
SUT VIC AB 1 CTX 27 (SUTURE) ×6 IMPLANT
TOWEL GREEN STERILE (TOWEL DISPOSABLE) ×3 IMPLANT
TUBE CONNECTING 12'X1/4 (SUCTIONS) ×1
TUBE CONNECTING 12X1/4 (SUCTIONS) ×2 IMPLANT
YANKAUER SUCT BULB TIP NO VENT (SUCTIONS) ×3 IMPLANT

## 2019-10-17 NOTE — Interval H&P Note (Signed)
History and Physical Interval Note:  10/08/2019 6:59 AM  Felicia Reynolds  has presented today for surgery, with the diagnosis of Abscess/Dehiscence Left Foot Status Post 1st Ray Amputation.  The various methods of treatment have been discussed with the patient and family. After consideration of risks, benefits and other options for treatment, the patient has consented to  Procedure(s): LEFT BELOW KNEE AMPUTATION (Left) as a surgical intervention.  The patient's history has been reviewed, patient examined, no change in status, stable for surgery.  I have reviewed the patient's chart and labs.  Questions were answered to the patient's satisfaction.     Newt Minion

## 2019-10-17 NOTE — Progress Notes (Signed)
eLink Physician-Brief Progress Note Patient Name: Felicia Reynolds DOB: 1980/07/05 MRN: [id number]   Date of Service  10/06/2019  HPI/Events of Note  Post intubation ABG looks good.  eICU Interventions  FIO2 reduced to 30 %.        Kerry Kass Icyss Skog 10/08/2019, 9:42 PM

## 2019-10-17 NOTE — Anesthesia Procedure Notes (Signed)
Anesthesia Regional Block: Popliteal block   Pre-Anesthetic Checklist: ,, timeout performed, Correct Patient, Correct Site, Correct Laterality, Correct Procedure, Correct Position, site marked, Risks and benefits discussed,  Surgical consent,  Pre-op evaluation,  At surgeon's request and post-op pain management  Laterality: Left  Prep: chloraprep       Needles:  Injection technique: Single-shot  Needle Type: Echogenic Stimulator Needle     Needle Length: 10cm  Needle Gauge: 21     Additional Needles:   Procedures:, nerve stimulator,,,,,,,   Nerve Stimulator or Paresthesia:  Response: 0.4 mA,   Additional Responses:   Narrative:  Start time: 10/26/2019 9:05 AM End time: 10/08/2019 9:20 AM Injection made incrementally with aspirations every 5 mL.  Performed by: Personally  Anesthesiologist: Lillia Abed, MD  Additional Notes: Monitors applied. Patient sedated. Sterile prep and drape,hand hygiene and sterile gloves were used. Relevant anatomy identified.Needle position confirmed.Local anesthetic injected incrementally after negative aspiration. Local anesthetic spread visualized around nerve(s). Vascular puncture avoided. No complications. Image printed for medical record.The patient tolerated the procedure well.  Additional Saphenous nerve block performed. 15cc Local Anesthetic mixture placed under ultrasonic guidance along the medio-inferior border of the Sartorious muscle 6 inches above the knee.  No Problems encountered.  Lillia Abed MD

## 2019-10-17 NOTE — Anesthesia Procedure Notes (Addendum)
Procedure Name: Intubation Date/Time: 10/05/2019 5:15 PM Performed by: Josephine Igo, CRNA Pre-anesthesia Checklist: Suction available and Patient being monitored Oxygen Delivery Method: Ambu bag Preoxygenation: Pre-oxygenation with 100% oxygen Ventilation: Mask ventilation without difficulty Laryngoscope Size: Miller and 2 Grade View: Grade I Tube type: Oral Tube size: 7.5 mm Number of attempts: 1 Airway Equipment and Method: Stylet Placement Confirmation: ETT inserted through vocal cords under direct vision,  CO2 detector and breath sounds checked- equal and bilateral Secured at: 24 cm Tube secured with: Tape Dental Injury: Teeth and Oropharynx as per pre-operative assessment  Comments: Responded to [place]; intubated DL X1; ETCO2 +; BS +

## 2019-10-17 NOTE — Anesthesia Postprocedure Evaluation (Signed)
Anesthesia Post Note  Patient: Felicia Reynolds  Procedure(s) Performed: LEFT BELOW KNEE AMPUTATION (Left Knee)     Patient location during evaluation: PACU Anesthesia Type: General Level of consciousness: awake and alert Pain management: pain level controlled Vital Signs Assessment: post-procedure vital signs reviewed and stable Respiratory status: spontaneous breathing, nonlabored ventilation, respiratory function stable and patient connected to nasal cannula oxygen Cardiovascular status: blood pressure returned to baseline and stable Postop Assessment: no apparent nausea or vomiting Anesthetic complications: no   No complications documented.  Last Vitals:  Vitals:   10/23/2019 1311 10/26/2019 1347  BP: (!) 119/57 121/63  Pulse: 93 94  Resp: 11 16  Temp: 37.1 C 37.1 C  SpO2: 98% 99%    Last Pain:  Vitals:   11/02/2019 1500  TempSrc:   PainSc: 9                  [name] DAVID

## 2019-10-17 NOTE — Progress Notes (Signed)
CRITICAL VALUE ALERT  Critical Value:  PH-6.824 and PCO2-92.7  Date & Time Notied:  10/11/2019 2992  Provider Notified: Dr. Tamala Julian  Orders Received/Actions taken: 2 amps bicarb already ordered. No additional orders.   Irven [place], RN

## 2019-10-17 NOTE — Anesthesia Preprocedure Evaluation (Signed)
Anesthesia Evaluation  Patient identified by MRN, date of birth, ID band Patient awake    Reviewed: Allergy & Precautions, NPO status , Patient's Chart, lab work & pertinent test results  Airway Mallampati: I  TM Distance: >3 FB Neck ROM: Full    Dental   Pulmonary Current Smoker,    Pulmonary exam normal        Cardiovascular hypertension, Pt. on medications Normal cardiovascular exam     Neuro/Psych Anxiety Bipolar Disorder CVA    GI/Hepatic   Endo/Other  diabetes, Type 2  Renal/GU      Musculoskeletal   Abdominal   Peds  Hematology   Anesthesia Other Findings   Reproductive/Obstetrics                             Anesthesia Physical Anesthesia Plan  ASA: III  Anesthesia Plan: General   Post-op Pain Management:  Regional for Post-op pain   Induction:   PONV Risk Score and Plan: 2 and Ondansetron and Midazolam  Airway Management Planned: LMA  Additional Equipment:   Intra-op Plan:   Post-operative Plan: Extubation in OR  Informed Consent: I have reviewed the patients History and Physical, chart, labs and discussed the procedure including the risks, benefits and alternatives for the proposed anesthesia with the patient or authorized representative who has indicated his/her understanding and acceptance.       Plan Discussed with: CRNA and Surgeon  Anesthesia Plan Comments:         Anesthesia Quick Evaluation

## 2019-10-17 NOTE — Progress Notes (Signed)
RN went in to pt's room to check CBG. Pt was unresponsive, clammy, with no pulse felt. Code blue called and CPR started at 1710. Pt transferred to 39M.

## 2019-10-17 NOTE — Progress Notes (Addendum)
PROGRESS NOTE    Felicia Reynolds  XTG:[id number] DOB: Oct 05, 1980 DOA: 10/20/2019 PCP: [facility]   Brief Narrative: Felicia Reynolds is a 39 y.o. female with a history of diabetes mellitus, diabetic foot infection/osteomyelitis s/p recent amputation of 2nd digit, stage IIIb CKD, chronic narcotic dependence. Patient presented with fever and foot pain and found to have evidence of significant left foot infection. Patient started on empiric antibiotics and found to have an associated bacteremia. Orthopedic surgery consulted and performed Left BKA.   Assessment & Plan:   Principal Problem:   Diabetic foot infection ([place]) Active Problems:   Morbid obesity ([place])   Personal history of noncompliance with medical treatment, presenting hazards to health   Essential hypertension, benign   Uncontrolled type 2 diabetes mellitus with peripheral neuropathy ([place])   Subacute osteomyelitis, left ankle and foot ([place])   Sepsis ([place])   Acute kidney injury ([place])   Bipolar disorder ([place])   Cutaneous abscess of left foot   Left diabetic foot infection Patient started empirically on Vancomycin, Cefepime and Flagyl. Orthopedic surgery consulted and performed left BKA on 10/13. -Orthopedic surgery recommendations: wound vac placed  E. Coli bacteremia Patient transitioned to Unasyn from above antibiotic regimen.  Diabetes mellitus, type 2 Uncontrolled with hyperglycemia. Patient also has associated neuropathy and nephropathy. Patient's last hemoglobin A1C is 9.0% -Continue Lantus 40 units and SSI  Leukocytosis Worsened in setting of foot infection. Should improve after BKA -Daily CBC  CKD stage 3b with prolonged AKI AKI present prior to admission. High risk for recurrent dialysis dependence. -Nephrology recommendations   Elevated alkaline phosphatase In setting of acute infection. No evidence for gallbladder disease. Normal AST/ALT.  Diabetic neuropathy Patient is on a  significantly high dose of Lyrica for her renal function -Decrease to 75 mg BID  Hyperlipidemia -Continue Crestor 20 mg daily  Sepsis Mentioned in chart and on H&P so appears present on admission. In setting of E. Coli bacteremia and diabetic foot wound.  Cardiac/respiratory arrest Received page that CODE was called for lack of pulse on vitals check. Patient underwent 4 minutes of CPR with epinephrine/bicarb x1 and ROSC. Patient transferred to [facility] ICU. Consulted PCCM for transfer of service. Called patient's emergency contact which is a friend and informed her of patient's change in status. Phone number to ICU given.    DVT prophylaxis: Heparin subq Code Status:   Code Status: Full Code Family Communication: None at bedside Disposition Plan: Discharge home vs SNF pending orthopedic surgery recommendations in addition to nephrology and PT recommendations   Consultants:   Orthopedic surgery  Nephrology  Procedures:   LEFT BKA (10/13)  Antimicrobials:  Vancomycin  Zosyn  Cefepime  Unasyn    Subjective: Patient is asleep post-surgery.  Objective: Vitals:   10/13/2019 1241 10/19/2019 1256 10/23/2019 1311 10/08/2019 1347  BP: 119/61 129/60 (!) 119/57 121/63  Pulse: 94 93 93 94  Resp: 14 13 11 16   Temp:   98.8 F (37.1 C) 98.8 F (37.1 C)  TempSrc:    Oral  SpO2: 98% 93% 98% 99%  Weight:      Height:        Intake/Output Summary (Last 24 hours) at 10/26/2019 1645 Last data filed at 10/30/2019 1318 Gross per 24 hour  Intake 758 ml  Output 150 ml  Net 608 ml   Filed Weights   10/11/2019 2224 10/23/2019 0932  Weight: 104.3 kg 104.3 kg    Examination:  General exam: Appears calm and comfortable Respiratory  system: Respiratory effort normal. Gastrointestinal system: Abdomen is nondistended, soft and nontender. No organomegaly or masses felt. Central nervous system: Asleep Musculoskeletal: No edema. No calf tenderness Skin: No cyanosis. No rashes    Data  Reviewed: I have personally reviewed following labs and imaging studies  CBC Lab Results  Component Value Date   WBC 21.9 (H) 10/23/2019   RBC 3.12 (L) 10/21/2019   HGB 8.1 (L) 10/11/2019   HCT 25.4 (L) 10/19/2019   MCV 81.4 10/11/2019   MCH 26.0 10/11/2019   PLT 170 11/04/2019   MCHC 31.9 11/02/2019   RDW 22.1 (H) 11/01/2019   LYMPHSABS 0.3 (L) 11/03/2019   MONOABS 0.1 10/10/2019   EOSABS 0.0 10/28/2019   BASOSABS 0.0 81/27/5170     Last metabolic panel Lab Results  Component Value Date   NA 135 10/07/2019   K 4.3 10/11/2019   CL 106 10/27/2019   CO2 20 (L) 10/14/2019   BUN 61 (H) 10/20/2019   CREATININE 3.91 (H) 10/28/2019   GLUCOSE 90 10/07/2019   GFRNONAA 13 (L) 10/29/2019   GFRAA 14 (L) 09/24/2019   CALCIUM 8.2 (L) 10/05/2019   PHOS 5.5 (H) 09/24/2019   PROT 6.1 (L) 10/06/2019   ALBUMIN 1.5 (L) 10/06/2019   LABGLOB 3.2 05/29/2015   AGRATIO 1.3 05/29/2015   BILITOT 0.6 10/21/2019   ALKPHOS 322 (H) 11/02/2019   AST 14 (L) 10/15/2019   ALT 19 10/25/2019   ANIONGAP 9 10/24/2019    CBG (last 3)  Recent Labs    10/11/2019 0648 10/16/2019 0854 10/27/2019 1156  GLUCAP 70 71 87     GFR: Estimated Creatinine Clearance: 23.1 mL/min (A) (by C-G formula based on SCr of 3.91 mg/dL (H)).  Coagulation Profile: Recent Labs  Lab 10/19/2019 2252  INR 1.3*    Recent Results (from the past 240 hour(s))  Blood culture (routine x 2)     Status: Abnormal   Collection Time: 11/03/2019 10:52 PM   Specimen: BLOOD  Result Value Ref Range Status   Specimen Description   Final    BLOOD Performed at [facility], [address], [place], [place] [address]    Special Requests   Final    NONE Performed at [facility], [address] [place], [place] [address]    Culture  Setup Time   Final    GRAM NEGATIVE RODS AEROBIC BOTTLE ONLY Gram Stain Report Called to,Read Back By and Verified With: CARDWELL L. @ [id number] BW [id number] BY HENDERSON L. Organism ID to follow GRAM NEGATIVE  RODS ANAEROBIC BOTTLE ONLY RESULT PREV CALLED CRITICAL RESULT CALLED TO, READ BACK BY AND VERIFIED WITH: PHARMD A MEYER [id number] AT 1645 BY CM GRAM POSITIVE COCCI IN PAIRS ANAEROBIC BOTTLE ONLY CRITICAL RESULT CALLED TO, READ BACK BY AND VERIFIED WITH: J. LEDFORD,PHARMD [id number] 10/14/2019 Mena Goes Performed at [facility], [place] [address], [place], [place] [address]    Culture ESCHERICHIA COLI (A)  Final   Report Status 10/15/2019 FINAL  Final   Organism ID, Bacteria ESCHERICHIA COLI  Final      Susceptibility   Escherichia coli - MIC*    AMPICILLIN <=2 SENSITIVE Sensitive     CEFAZOLIN <=4 SENSITIVE Sensitive     CEFEPIME <=0.12 SENSITIVE Sensitive     CEFTAZIDIME <=1 SENSITIVE Sensitive     CEFTRIAXONE <=0.25 SENSITIVE Sensitive     CIPROFLOXACIN <=0.25 SENSITIVE Sensitive     GENTAMICIN <=1 SENSITIVE Sensitive     IMIPENEM <=0.25 SENSITIVE Sensitive  TRIMETH/SULFA >=320 RESISTANT Resistant     AMPICILLIN/SULBACTAM <=2 SENSITIVE Sensitive     PIP/TAZO <=4 SENSITIVE Sensitive     * ESCHERICHIA COLI  Blood Culture ID Panel (Reflexed)     Status: Abnormal   Collection Time: 10/24/2019 10:52 PM  Result Value Ref Range Status   Enterococcus faecalis NOT DETECTED NOT DETECTED Final   Enterococcus Faecium NOT DETECTED NOT DETECTED Final   Listeria monocytogenes NOT DETECTED NOT DETECTED Final   Staphylococcus species NOT DETECTED NOT DETECTED Final   Staphylococcus aureus (BCID) NOT DETECTED NOT DETECTED Final   Staphylococcus epidermidis NOT DETECTED NOT DETECTED Final   Staphylococcus lugdunensis NOT DETECTED NOT DETECTED Final   Streptococcus species NOT DETECTED NOT DETECTED Final   Streptococcus agalactiae NOT DETECTED NOT DETECTED Final   Streptococcus pneumoniae NOT DETECTED NOT DETECTED Final   Streptococcus pyogenes NOT DETECTED NOT DETECTED Final   A.calcoaceticus-baumannii NOT DETECTED NOT DETECTED Final   Bacteroides fragilis NOT DETECTED NOT DETECTED Final    Enterobacterales DETECTED (A) NOT DETECTED Final    Comment: Enterobacterales represent a large order of gram negative bacteria, not a single organism. CRITICAL RESULT CALLED TO, READ BACK BY AND VERIFIED WITH: PHARMD A MEYER [id number] AT 1645 BY CM    Enterobacter cloacae complex NOT DETECTED NOT DETECTED Final   Escherichia coli DETECTED (A) NOT DETECTED Final    Comment: CRITICAL RESULT CALLED TO, READ BACK BY AND VERIFIED WITH: PHARMD A MEYER [id number] AT 1645 BY CM    Klebsiella aerogenes NOT DETECTED NOT DETECTED Final   Klebsiella oxytoca NOT DETECTED NOT DETECTED Final   Klebsiella pneumoniae NOT DETECTED NOT DETECTED Final   Proteus species NOT DETECTED NOT DETECTED Final   Salmonella species NOT DETECTED NOT DETECTED Final   Serratia marcescens NOT DETECTED NOT DETECTED Final   Haemophilus influenzae NOT DETECTED NOT DETECTED Final   Neisseria meningitidis NOT DETECTED NOT DETECTED Final   Pseudomonas aeruginosa NOT DETECTED NOT DETECTED Final   Stenotrophomonas maltophilia NOT DETECTED NOT DETECTED Final   Candida albicans NOT DETECTED NOT DETECTED Final   Candida auris NOT DETECTED NOT DETECTED Final   Candida glabrata NOT DETECTED NOT DETECTED Final   Candida krusei NOT DETECTED NOT DETECTED Final   Candida parapsilosis NOT DETECTED NOT DETECTED Final   Candida tropicalis NOT DETECTED NOT DETECTED Final   Cryptococcus neoformans/gattii NOT DETECTED NOT DETECTED Final   CTX-M ESBL NOT DETECTED NOT DETECTED Final   Carbapenem resistance IMP NOT DETECTED NOT DETECTED Final   Carbapenem resistance KPC NOT DETECTED NOT DETECTED Final   Carbapenem resistance NDM NOT DETECTED NOT DETECTED Final   Carbapenem resist OXA 48 LIKE NOT DETECTED NOT DETECTED Final   Carbapenem resistance VIM NOT DETECTED NOT DETECTED Final    Comment: Performed at [facility], [address] [place], [place] [address]  Blood Culture ID Panel (Reflexed)     Status: None   Collection Time: 10/19/2019  10:52 PM  Result Value Ref Range Status   Enterococcus faecalis NOT DETECTED NOT DETECTED Final   Enterococcus Faecium NOT DETECTED NOT DETECTED Final   Listeria monocytogenes NOT DETECTED NOT DETECTED Final   Staphylococcus species NOT DETECTED NOT DETECTED Final   Staphylococcus aureus (BCID) NOT DETECTED NOT DETECTED Final   Staphylococcus epidermidis NOT DETECTED NOT DETECTED Final   Staphylococcus lugdunensis NOT DETECTED NOT DETECTED Final   Streptococcus species NOT DETECTED NOT DETECTED Final   Streptococcus agalactiae NOT DETECTED NOT DETECTED Final   Streptococcus pneumoniae NOT  DETECTED NOT DETECTED Final   Streptococcus pyogenes NOT DETECTED NOT DETECTED Final   A.calcoaceticus-baumannii NOT DETECTED NOT DETECTED Final   Bacteroides fragilis NOT DETECTED NOT DETECTED Final   Enterobacterales NOT DETECTED NOT DETECTED Final   Enterobacter cloacae complex NOT DETECTED NOT DETECTED Final   Escherichia coli NOT DETECTED NOT DETECTED Final   Klebsiella aerogenes NOT DETECTED NOT DETECTED Final   Klebsiella oxytoca NOT DETECTED NOT DETECTED Final   Klebsiella pneumoniae NOT DETECTED NOT DETECTED Final   Proteus species NOT DETECTED NOT DETECTED Final   Salmonella species NOT DETECTED NOT DETECTED Final   Serratia marcescens NOT DETECTED NOT DETECTED Final   Haemophilus influenzae NOT DETECTED NOT DETECTED Final   Neisseria meningitidis NOT DETECTED NOT DETECTED Final   Pseudomonas aeruginosa NOT DETECTED NOT DETECTED Final   Stenotrophomonas maltophilia NOT DETECTED NOT DETECTED Final   Candida albicans NOT DETECTED NOT DETECTED Final   Candida auris NOT DETECTED NOT DETECTED Final   Candida glabrata NOT DETECTED NOT DETECTED Final   Candida krusei NOT DETECTED NOT DETECTED Final   Candida parapsilosis NOT DETECTED NOT DETECTED Final   Candida tropicalis NOT DETECTED NOT DETECTED Final   Cryptococcus neoformans/gattii NOT DETECTED NOT DETECTED Final    Comment: Performed at  [facility], [address] [place], [place] [address]  Blood culture (routine x 2)     Status: None   Collection Time: 10/09/2019 10:54 PM   Specimen: BLOOD  Result Value Ref Range Status   Specimen Description BLOOD  Final   Special Requests NONE  Final   Culture   Final    NO GROWTH 5 DAYS Performed at [facility], [address], [place], [place] [address]    Report Status 10/05/2019 FINAL  Final  Respiratory Panel by RT PCR (Flu A&B, Covid) - Nasopharyngeal Swab     Status: None   Collection Time: 10/13/19  3:11 AM   Specimen: Nasopharyngeal Swab  Result Value Ref Range Status   SARS Coronavirus 2 by RT PCR NEGATIVE NEGATIVE Final    Comment: (NOTE) SARS-CoV-2 target nucleic acids are NOT DETECTED.  The SARS-CoV-2 RNA is generally detectable in upper respiratoy specimens during the acute phase of infection. The lowest concentration of SARS-CoV-2 viral copies this assay can detect is 131 copies/mL. A negative result does not preclude SARS-Cov-2 infection and should not be used as the sole basis for treatment or other patient management decisions. A negative result may occur with  improper specimen collection/handling, submission of specimen other than nasopharyngeal swab, presence of viral mutation(s) within the areas targeted by this assay, and inadequate number of viral copies (<131 copies/mL). A negative result must be combined with clinical observations, patient history, and epidemiological information. The expected result is Negative.  Fact Sheet for Patients:  PinkCheek.be  Fact Sheet for Healthcare Providers:  GravelBags.it  This test is no t yet approved or cleared by the [place] FDA and  has been authorized for detection and/or diagnosis of SARS-CoV-2 by FDA under an Emergency Use Authorization (EUA). This EUA will remain  in effect (meaning this test can be used) for the duration of  the COVID-19 declaration under Section 564(b)(1) of the Act, 21 U.S.C. section 360bbb-3(b)(1), unless the authorization is terminated or revoked sooner.     Influenza A by PCR NEGATIVE NEGATIVE Final   Influenza B by PCR NEGATIVE NEGATIVE Final    Comment: (NOTE) The Xpert Xpress SARS-CoV-2/FLU/RSV assay is intended as an aid in  the  diagnosis of influenza from Nasopharyngeal swab specimens and  should not be used as a sole basis for treatment. Nasal washings and  aspirates are unacceptable for Xpert Xpress SARS-CoV-2/FLU/RSV  testing.  Fact Sheet for Patients: PinkCheek.be  Fact Sheet for Healthcare Providers: GravelBags.it  This test is not yet approved or cleared by the [place] FDA and  has been authorized for detection and/or diagnosis of SARS-CoV-2 by  FDA under an Emergency Use Authorization (EUA). This EUA will remain  in effect (meaning this test can be used) for the duration of the  Covid-19 declaration under Section 564(b)(1) of the Act, 21  U.S.C. section 360bbb-3(b)(1), unless the authorization is  terminated or revoked. Performed at [facility], [address] [place],  [address]         Radiology Studies: No results found.      Scheduled Meds: . sodium chloride   Intravenous Once  . sodium chloride   Intravenous Once  . DULoxetine  60 mg Oral Daily  . heparin  5,000 Units Subcutaneous Q8H  . influenza vac split quadrivalent PF  0.5 mL Intramuscular Tomorrow-1000  . insulin aspart  0-20 Units Subcutaneous TID WC  . insulin aspart  0-5 Units Subcutaneous QHS  . insulin aspart  6 Units Subcutaneous TID WC  . insulin glargine  40 Units Subcutaneous QHS  . linaclotide  72 mcg Oral Daily  . pregabalin  200 mg Oral TID  . rosuvastatin  20 mg Oral QHS  . sodium chloride flush  3 mL Intravenous Q12H   Continuous Infusions: . sodium chloride    . sodium chloride 10 mL/hr at 11/03/2019  0949  . sodium chloride 75 mL/hr at 10/25/2019 1456  . ampicillin-sulbactam (UNASYN) IV 3 g (10/30/2019 0019)     LOS: 4 days     Cordelia Poche, MD [facility] 10/20/2019, 4:45 PM  If 7PM-7AM, please contact night-coverage [email or web address]

## 2019-10-17 NOTE — Consult Note (Addendum)
NAME:  Felicia Reynolds, MRN:  [id number], DOB:  Jun 02, 1980, LOS: 4 ADMISSION DATE:  10/24/2019, CONSULTATION DATE:  10/14/2019 REFERRING MD:  Dr. 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  Intubated and transferred to ICU, PCCM consulted.  History of present illness   HPI obtained from medical chart review as patient is unresponsive and intubated on mechanical ventilation.  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  Admitted to [facility] on 10/8 for left foot infection and started on empiric vancomycin, cefepime, and flagyl. Transferred to [facility] and Orthopedics consulted. Nephrology consulted given her ongoing AKI from prior September admission.  Found on blood cultures to have E. Coli bacteremia and was deescalated to Unasyn 10/11.  She was taken on 88/50 for uncomplicated left BKA and returned to the floor.  She was given dilaudid 1mg  for pain at 1500.  Then, staff found her apneic and pulseless around 1700 on a blood CBG check.  Unknown down time.  ACLS measures initiated.  ROSC obtained after 4 minutes.  She was intubated by anesthesia and transferred to the ICU.  PCCM consulted.   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 Sharol Given Nephrology   Procedures:  10/13 left BKA 10/13 ETT  >>  Significant Diagnostic Tests:  10/9 CT left foot/ tib/ fib >> 1. Interval medial forefoot amputation with resection of the 1st metatarsal and great toe and amputation through the mid 2nd metatarsal. 2. Extensive inflammatory changes in the forefoot and midfoot with multifocal ill-defined fluid collections and air bubbles suspicious for recurrent soft tissue infection. 3. New multifocal osseous fragmentation/fractures involving the 3rd and 4th proximal phalangeal bases, 3rd metatarsal base and distal cuboid. There is lateral dislocation at the 3rd MTP joint. These findings could be neuropathic, but given the close approximation to the soft tissue findings and the rapid development, osteomyelitis cannot be excluded. 4. No acute osseous findings or bone destruction identified within the hindfoot or lower leg.  Micro Data:  10/9 SARS 2/ flu >> neg 10/8 BCx 2 >> E. Coli 10/13 MRSA PCR >>  Antimicrobials:  10/8 vanc  10/8 cefepime >> 10/10 10/8 clindamycin  10/9 flagyl >> > 10/10 10/11 Unasyn >> 10/13 cefazolin preop  Interim history/subjective:    Objective   Blood pressure 121/63, pulse 94, temperature 98.8 F (37.1 C), temperature source Oral, resp. rate 16, height 5\' 6"  (1.676 m), weight 104.3 kg, SpO2 99 %.    Vent Mode: PRVC FiO2 (%):  [100 %] 100 % Vt Set:  [470 mL] 470 mL PEEP:  [5 cmH20] 5 cmH20   Intake/Output Summary (Last 24 hours) at 10/18/2019 1740 Last data filed at 10/11/2019 1318 Gross per 24 hour  Intake 758 ml  Output 150 ml  Net 608 ml   Filed Weights   10/13/2019 2224 11/02/2019 0932  Weight: 104.3 kg  104.3 kg   Examination: General:  Critically ill, obese female unresponsive, intubated on MV HEENT: ETT, pupils 5/ fixed, absent corneal's  Neuro: unresponsive  CV: NSR, no murmur PULM:  MV supported breaths, CTA, no cough GI: obese, soft, hypobs, foley  Extremities: warm/dry, Left BKA in brace with wound vac    Resolved Hospital Problem list     Assessment & Plan:   Cardiac arrest - occurring after narcotics, complicated by her AKI on CKD Shock- multifactorial,  related to acidosis, can not rule out septic component, +/- post cardiac stunning, although bedside TTE appears to have good EF  - unknown down time, unaccounted for 2 hours  P:  Defer TTM given unknown down time  Levophed for MAP goal > 65 Will place CVL Serial neuro exams Avoid fever  Assess EKG/  troponin's, consider formal TTE  Assess/ trend lactic  Hold on expanding abx for now CBC, CMET, ddimer, coags now    Acute hypercarbic/ hypoxic respiratory failure in the setting above P:  Full MV support ABG/ CXR pending  VAP bundle  PPI   Metabolic acidosis  CKD stage IIIb with prolonged AKI P: Bicarb gtt Foley  Strict I/Os/ daily weight BMET now Nephrology already following given prior high risk for dialysis but given new event, renal function will likely deteriorate.  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 Coli bacteremia  P:  Continue unasyn  Trend WBC/ fever curve   HLD P:  Hold crestor  Bipolar Disorder P:  Hold Cymbalta   Chronic pain/ narcotic dependence  P:  Hold lyrica  Prn fentanyl as needed   Best practice:  Diet: NPO Pain/Anxiety/Delirium protocol (if indicated): not currently needed, will add as needed VAP protocol (if indicated): yes DVT prophylaxis: SQ heparin  GI prophylaxis: PPI Glucose control: SSI  Mobility: BR Code Status: Full  Family Communication: only contact listed is a friend/ neighbor, Education officer, environmental.  On chart review, patient reported her husband refused to bring her to hospital when she was sick and on ortho note reported that he was mean to her.   Disposition: ICU   Labs   CBC: Recent Labs  Lab 10/27/2019 2247 10/27/2019 2247  10/13/19 1558 10/13/19 1558 10/14/19 0093 10/15/19 0704 10/16/19 0424 10/16/19 2245 10/23/2019 0200  WBC 4.1   < > 16.0*  --  14.4* 13.1* 14.3*  --  21.9*  NEUTROABS 3.6  --   --   --   --   --   --   --   --   HGB 7.8*   < > 6.7*   < > 7.3* 8.3* 7.4* 8.4* 8.1*  HCT 26.0*   < > 22.0*   < > 23.2* 26.2* 23.0* 26.4* 25.4*  MCV 80.5   < > 80.3  --  82.0 82.1 82.1  --  81.4  PLT 210   < > 171  --  138* 180 167  --  170   < > = values in this interval not displayed.    Basic Metabolic Panel: Recent Labs  Lab 10/29/2019 2247 10/15/2019 2247 10/13/19 1558 10/14/19 0637 10/15/19 0704 10/16/19 0424 11/04/2019 0200  NA 135  --   --  135 134* 135 135  K 4.2  --   --  4.5 4.3 4.4  4.3  CL 104  --   --  105 104 105 106  CO2 18*  --   --  18* 18* 16* 20*  GLUCOSE 169*  --   --  194* 152* 100* 90  BUN 48*  --   --  55* 63* 66* 61*  CREATININE 3.69*   < > 4.20* 4.66* 4.68* 4.31* 3.91*  CALCIUM 8.5*  --   --  8.3* 8.4* 8.1* 8.2*   < > = values in this interval not displayed.   GFR: Estimated Creatinine Clearance: 23.1 mL/min (A) (by C-G formula based on SCr of 3.91 mg/dL (H)). Recent Labs  Lab 11/02/2019 2246 11/01/2019 2247 10/13/19 0049 10/13/19 0411 10/13/19 1558 10/14/19 0637 10/15/19 0704 10/16/19 0424 10/15/2019 0200  WBC  --    < >  --   --    < > 14.4* 13.1* 14.3* 21.9*  LATICACIDVEN 3.7*  --  4.5* 3.2*  --   --   --   --   --    < > = values in this interval not displayed.    Liver Function Tests: Recent Labs  Lab 11/01/2019 2247 10/15/19 0704 10/16/19 0424 10/20/2019 0200  AST 46* 37 24 14*  ALT 30 38 25 19  ALKPHOS 469* 265* 329* 322*  BILITOT 1.0 0.8 1.0 0.6  PROT 7.5 6.6 5.8* 6.1*  ALBUMIN 2.4* 1.9* 1.6* 1.5*   No results for input(s): LIPASE, AMYLASE in the last 168 hours. No results for input(s): AMMONIA in the last 168 hours.  ABG    Component Value Date/Time   TCO2 18 (L) 09/13/2019 1228     Coagulation Profile: Recent Labs  Lab 10/07/2019 2252  INR 1.3*     Cardiac Enzymes: No results for input(s): CKTOTAL, CKMB, CKMBINDEX, TROPONINI in the last 168 hours.  HbA1C: Hemoglobin A1C  Date/Time Value Ref Range Status  03/12/2016 12:00 AM 14  Final   Hgb A1c MFr Bld  Date/Time Value Ref Range Status  09/13/2019 06:44 PM 9.0 (H) 4.8 - 5.6 % Final    Comment:    (NOTE) Pre diabetes:          5.7%-6.4%  Diabetes:              >6.4%  Glycemic control for   <7.0% adults with diabetes   11/25/2018 06:44 AM 8.3 (H) 4.8 - 5.6 % Final    Comment:    (NOTE) Pre diabetes:          5.7%-6.4% Diabetes:              >6.4% Glycemic control for   <7.0% adults with diabetes     CBG: Recent Labs  Lab 10/16/19 2006 10/14/2019 0648 10/11/2019 0854 11/03/2019 1156 10/27/2019 1709  GLUCAP 81 70 71 87 72    Review of Systems:   unable  Past Medical History  She,  has a past medical history of Anxiety, Arthritis, Bipolar disorder ([place]), Diabetes ([place]) (06/05/2015), Diabetes mellitus, Fibromyalgia, History of abnormal cervical Pap smear (05/29/2015), History of diabetes mellitus, type II (05/29/2015), Hypertension, Neuropathy, Obesity (05/29/2015), Osteoporosis, Ovarian cyst (02/20/2016), Ovarian cyst (02/20/2016), Panic attacks, Pneumonia, Stroke ([place]), Superficial fungus infection of skin (05/29/2015), and Vitamin D deficiency (06/05/2015).   Surgical History    Past Surgical History:  Procedure Laterality Date  . AMPUTATION Left 09/17/2019   Procedure: AMPUTATION LEFT GREAT TOE/ RAY WITH WOUND VAC APPLICATION;  Surgeon: Leandrew Koyanagi, MD;  Location: [place];  Service: Orthopedics;  Laterality: Left;  . AMPUTATION Left 09/19/2019   Procedure: LEFT FOOT 1ST RAY AMPUTATION;  Surgeon: Newt Minion, MD;  Location: [place];  Service: Orthopedics;  Laterality: Left;  . CESAREAN SECTION    . HERNIA REPAIR    . IR FLUORO GUIDE CV LINE RIGHT  09/20/2019  . IR REMOVAL TUN CV CATH W/O FL  09/24/2019  . IR US GUIDE VASC ACCESS RIGHT  09/20/2019  . THROAT SURGERY        Social History   reports that she has been smoking cigarettes. She has a 6.50 pack-year smoking history. She has never used smokeless tobacco. She reports that she does not drink alcohol and does not use drugs.   Family History   Her family history includes Breast cancer in her maternal aunt, maternal grandmother, and mother; Cancer in her maternal aunt; Cancer (age of onset: 77) in her mother.   Allergies Allergies  Allergen Reactions  . Hydrocodone Itching  . Latex Itching and Rash     Home Medications  Prior to Admission medications   Medication Sig Start Date End Date Taking? Authorizing Provider  bismuth subsalicylate (PEPTO BISMOL) 262 MG/15ML suspension Take 30 mLs by mouth daily as needed for indigestion.    [provider]  bisoprolol-hydrochlorothiazide (ZIAC) 5-6.25 MG tablet Take 1 tablet by mouth daily. 06/21/19   [provider]  clonazePAM (KLONOPIN) 0.5 MG tablet Take 0.5 mg by mouth 2 (two) times daily as needed for anxiety. 08/13/19   [provider]  cloNIDine (CATAPRES) 0.2 MG tablet Take 0.2 mg by mouth 2 (two) times daily.    [provider]  clotrimazole-betamethasone (LOTRISONE) cream Apply 1 application topically 2 (two) times daily. 09/26/19   [provider]  diclofenac sodium (VOLTAREN) 1 % GEL Apply 2 g topically 4 (four) times daily as needed (pain).     [provider]  DULoxetine (CYMBALTA) 60 MG capsule Take 60 mg by mouth daily. 06/21/19   [provider]  furosemide (LASIX) 20 MG tablet Take 20 mg by mouth 2 (two) times daily as needed for edema. 04/21/19   [provider]  hydrOXYzine (ATARAX/VISTARIL) 50 MG tablet Take 50 mg by mouth 2 (two) times daily as needed for anxiety. 06/09/19   [provider]  Insulin Glargine (LANTUS SOLOSTAR) 100 UNIT/ML Solostar Pen Inject 80 Units into the skin at bedtime. Patient taking differently: Inject 80 Units into the skin 2 (two) times  daily. Inject -80 units under the skin twice daily. 09/15/18   Florian Buff, MD  LINZESS 72 MCG capsule Take 72 mcg by mouth daily. 04/21/19   [provider]  lisinopril-hydrochlorothiazide (ZESTORETIC) 20-12.5 MG tablet Take 1 tablet by mouth daily. 04/21/19   [provider]  megestrol (MEGACE) 40 MG tablet Take 1 tablet (40 mg total) by mouth 3 (three) times daily as needed (regulate period). Patient not taking: Reported on 09/13/2019 12/05/18   Florian Buff, MD  NOVOLOG FLEXPEN 100 UNIT/ML FlexPen INJECT 30 UNITS SUBCUTANEOUSLY 3 TIMES A DAY WITH MEALS Patient not taking: Reported on 09/13/2019 09/07/19   Florian Buff, MD  oxyCODONE-acetaminophen (PERCOCET/ROXICET) 5-325 MG tablet Take 1 tablet by mouth every 6 (six) hours as needed for pain. 09/25/19   [provider]  pregabalin (LYRICA) 200 MG capsule Take 200 mg by mouth 3 (three) times daily.     [provider]  PROAIR HFA 108 (90 Base) MCG/ACT inhaler Inhale 1 puff into the lungs 4 (  four) times daily as needed for wheezing or shortness of breath. 08/15/19   [provider]  rosuvastatin (CRESTOR) 20 MG tablet Take 20 mg by mouth at bedtime. 06/21/19   [provider]  silver sulfADIAZINE (SILVADENE) 1 % cream APPLY TO AFFECTED AREA 2 OR 3 TIMES DAILY Patient taking differently: Apply 1 application topically 3 (three) times daily. APPLY TO AFFECTED AREA 2 OR 3 TIMES DAILY 03/30/19   Florian Buff, MD  SUBOXONE 8-2 MG FILM Place 2 Film under the tongue daily.  11/17/18   [provider]  torsemide (DEMADEX) 10 MG tablet Take 10 mg by mouth daily. 06/21/19   [provider]     Critical care time: 9 mins     Kennieth Rad, ACNP [facility] 10/18/2019, 6:42 PM  See Amion for personal pager PCCM on call pager [phone]

## 2019-10-17 NOTE — Progress Notes (Signed)
Admit: 10/28/2019 LOS: 4  34F with evidence of CKD 3 until presentation in September for diabetic foot ulcer requiring toe amputation and progressive AKI eventually requiring dialysis; she left 9/20 AMA and returns with a creatinine very similar to at the time of discharge.  Since admission her creatinine has worsened to 4.7.   Subjective:  . BKA earlier today . No c/o now . SCr improving . UOP not measured  10/12 0701 - 10/13 0700 In: 902.3 [P.O.:240; I.V.:297.3; Blood:358; IV Piggyback:7] Out: -   Filed Weights   10/29/2019 2224 10/28/2019 0932  Weight: 104.3 kg 104.3 kg    Scheduled Meds: . sodium chloride   Intravenous Once  . sodium chloride   Intravenous Once  . DULoxetine  60 mg Oral Daily  . heparin  5,000 Units Subcutaneous Q8H  . influenza vac split quadrivalent PF  0.5 mL Intramuscular Tomorrow-1000  . insulin aspart  0-20 Units Subcutaneous TID WC  . insulin aspart  0-5 Units Subcutaneous QHS  . insulin aspart  6 Units Subcutaneous TID WC  . insulin glargine  40 Units Subcutaneous QHS  . linaclotide  72 mcg Oral Daily  . pregabalin  200 mg Oral TID  . rosuvastatin  20 mg Oral QHS  . sodium chloride flush  3 mL Intravenous Q12H   Continuous Infusions: . sodium chloride    . sodium chloride 10 mL/hr at 10/20/2019 0949  . sodium chloride    . ampicillin-sulbactam (UNASYN) IV 3 g (10/25/2019 0019)   PRN Meds:.sodium chloride, acetaminophen **OR** acetaminophen, albuterol, clonazePAM, HYDROmorphone (DILAUDID) injection, hydrOXYzine, ondansetron **OR** ondansetron (ZOFRAN) IV, oxyCODONE, polyethylene glycol, sodium chloride flush, sorbitol  Current Labs: reviewed    Physical Exam:  Blood pressure 121/63, pulse 94, temperature 98.8 F (37.1 C), temperature source Oral, resp. rate 16, height 5\' 6"  (1.676 m), weight 104.3 kg, SpO2 99 %. GEN: NAD, chronically ill-appearing, resting  ENT: NCAT EYES: EOMI CV: Regular, normal S1 and S2 PULM: Clear breath sounds  bilaterally ABD: Soft, nontender SKIN: Left foot bandaged, not examined EXT: No significant edema NEURO: Nonfocal  A 1. Prolonged AKI, no immediate indication to resume dialysis; hopeful with supportive care and treatment of her bacteremia and foot ulcer she will have further improvement in GFR. 2. E. coli bacteremia, per primary related to #3, unasyn 3. Diabetic foot ulcer, dehisced left toe amputation site; s/p L BKA 10/29/2019 4. DM2, uncontrolled 5. Chronic pain 6. Hypertension, BP stable 7. Anemia, chronic, multifactorial; asymptomatic 8. Metabolic acidosis, improved  P 1. Continue supportive care, ok with hydration post-op 2. High risk of developing recurrent dialysis dependence, not currently indicated 3. Daily weights, Daily Renal Panel, Strict I/Os, Avoid nephrotoxins (NSAIDs, judicious IV Contrast)   . Medication Issues; o Preferred narcotic agents for pain control are hydromorphone, fentanyl, and methadone. Morphine should not be used.  o Baclofen should be avoided o Avoid oral sodium phosphate and magnesium citrate based laxatives / bowel preps    Pearson Grippe MD 10/27/2019, 2:54 PM  Recent Labs  Lab 10/15/19 0704 10/16/19 0424 10/19/2019 0200  NA 134* 135 135  K 4.3 4.4 4.3  CL 104 105 106  CO2 18* 16* 20*  GLUCOSE 152* 100* 90  BUN 63* 66* 61*  CREATININE 4.68* 4.31* 3.91*  CALCIUM 8.4* 8.1* 8.2*   Recent Labs  Lab 10/06/2019 2247 10/13/19 1558 10/15/19 0704 10/15/19 0704 10/16/19 0424 10/16/19 2245 11/03/2019 0200  WBC 4.1   < > 13.1*  --  14.3*  --  21.9*  NEUTROABS 3.6  --   --   --   --   --   --   HGB 7.8*   < > 8.3*   < > 7.4* 8.4* 8.1*  HCT 26.0*   < > 26.2*   < > 23.0* 26.4* 25.4*  MCV 80.5   < > 82.1  --  82.1  --  81.4  PLT 210   < > 180  --  167  --  170   < > = values in this interval not displayed.

## 2019-10-17 NOTE — Anesthesia Procedure Notes (Signed)
Procedure Name: LMA Insertion Date/Time: 10/11/2019 11:09 AM Performed by: Barrington Ellison, CRNA Pre-anesthesia Checklist: Patient identified, Emergency Drugs available, Suction available and Patient being monitored Patient Re-evaluated:Patient Re-evaluated prior to induction Oxygen Delivery Method: Circle System Utilized Preoxygenation: Pre-oxygenation with 100% oxygen Induction Type: IV induction Ventilation: Mask ventilation without difficulty LMA: LMA inserted LMA Size: 4.0 Number of attempts: 1 Placement Confirmation: positive ETCO2 Tube secured with: Tape Dental Injury: Teeth and Oropharynx as per pre-operative assessment

## 2019-10-17 NOTE — Op Note (Signed)
   Date of Surgery: 11/04/2019  INDICATIONS: Felicia Reynolds is a 39 y.o.-year-old female who presents with chronic osteomyelitis of the left calcaneus with a large ulcer over the left heel and purulent drainage.  Patient has failed prolonged conservative therapy for limb salvage and presents at this time for transtibial amputation.  PREOPERATIVE DIAGNOSIS: Abscess osteomyelitis ulceration left heel  POSTOPERATIVE DIAGNOSIS: Same.  PROCEDURE: Transtibial amputation Application of Prevena wound VAC  SURGEON: Sharol Given, M.D.  ANESTHESIA:  general  IV FLUIDS AND URINE: See anesthesia.  ESTIMATED BLOOD LOSS: 100 mL.  COMPLICATIONS: None.  DESCRIPTION OF PROCEDURE: The patient was brought to the operating room and underwent a general anesthetic. After adequate levels of anesthesia were obtained patient's lower extremity was prepped using DuraPrep draped into a sterile field. A timeout was called. The foot was draped out of the sterile field with impervious stockinette. A transverse incision was made 11 cm distal to the tibial tubercle. This curved proximally and a large posterior flap was created. The tibia was transected 1 cm proximal to the skin incision. The fibula was transected just proximal to the tibial incision. The tibia was beveled anteriorly. A large posterior flap was created. The sciatic nerve was pulled cut and allowed to retract. The vascular bundles were suture ligated with 2-0 silk. The deep and superficial fascial layers were closed using #1 Vicryl. The skin was closed using staples and 2-0 nylon. The wound was covered with a Prevena wound VAC. There was a good suction fit. A prosthetic shrinker was applied. Patient was extubated taken to the PACU in stable condition.   DISCHARGE PLANNING:  Antibiotic duration: 24 hours  Weightbearing: Nonweightbearing on the left  Pain medication: Opioid pathway  Dressing care/ Wound VAC: Continue wound VAC for 1 week after  discharge  Discharge to: Anticipate discharge to skilled nursing  Follow-up: In the office 1 week post operative.  Meridee Score, MD [facility] 11:58 AM

## 2019-10-17 NOTE — Procedures (Addendum)
Central Venous Catheter Insertion Procedure Note  Felicia Reynolds  [id number]  1980-05-25  Date:11/01/2019  Time:6:41 PM   Provider Performing:Linley Moxley R Hunner Garcon   Procedure: Insertion of Non-tunneled Central Venous Catheter(36556) with US guidance (76283)   Indication(s) Medication administration  Consent Unable to obtain consent due to emergent nature of procedure.  Anesthesia Topical only with 1% lidocaine   Timeout Verified patient identification, verified procedure, site/side was marked, verified correct patient position, special equipment/implants available, medications/allergies/relevant history reviewed, required imaging and test results available.  Sterile Technique Maximal sterile technique including full sterile barrier drape, hand hygiene, sterile gown, sterile gloves, mask, hair covering, sterile ultrasound probe cover (if used).  Procedure Description Area of catheter insertion was cleaned with chlorhexidine and draped in sterile fashion.  With real-time ultrasound guidance a central venous catheter was placed into the left internal jugular vein. Nonpulsatile blood flow and easy flushing noted in all ports.  The catheter was sutured in place and sterile dressing applied.  Complications/Tolerance None; patient tolerated the procedure well. Chest X-ray is ordered to verify placement for internal jugular or subclavian cannulation.   Chest x-ray is not ordered for femoral cannulation.  EBL Minimal  Specimen(s) None      Otilio Carpen Savino Whisenant, PA-C

## 2019-10-17 NOTE — Code Documentation (Signed)
  Patient Name: Felicia Reynolds   MRN: [id number]   Date of Birth/ Sex: 01-06-1980 , female      Admission Date: 10/09/2019  Attending Provider: Mariel Aloe, MD  Primary Diagnosis: Diabetic foot infection [facility])   Indication: Pt was in her usual state of health until this PM, when she was noted to be unresponsive. Code blue was subsequently called. At the time of arrival on scene, ACLS protocol was underway.    Technical Description:  - CPR performance duration:  4 minutes  - Was defibrillation or cardioversion used? No   - Was external pacer placed? No  - Was patient intubated pre/post CPR? Yes   Medications Administered: Y = Yes; Blank = No Amiodarone    Atropine    Calcium    Epinephrine  yes  Lidocaine    Magnesium    Norepinephrine    Phenylephrine    Sodium bicarbonate  yes  Vasopressin     Post CPR evaluation:  - Final Status - Was patient successfully resuscitated ? Yes - What is current rhythm? Sinus - What is current hemodynamic status? yes  Miscellaneous Information:  - Labs sent, including: CBC, CMP  - Primary team notified?  Yes, Dr.Netty came bedside and took patient to the ICU with nursing team.   - Family Notified? Per Primary  - Additional notes/ transfer status: Patient transferred to [facility] ICU     Madalyn Rob, MD  10/24/2019, 5:48 PM

## 2019-10-17 NOTE — Transfer of Care (Signed)
Immediate Anesthesia Transfer of Care Note  Patient: Felicia Reynolds  Procedure(s) Performed: LEFT BELOW KNEE AMPUTATION (Left Knee)  Patient Location: PACU  Anesthesia Type:General and Regional  Level of Consciousness: drowsy  Airway & Oxygen Therapy: Patient Spontanous Breathing and Patient connected to nasal cannula oxygen  Post-op Assessment: Report given to RN  Post vital signs: Reviewed and stable  Last Vitals:  Vitals Value Taken Time  BP 110/53 10/31/2019 1155  Temp    Pulse 100 10/25/2019 1156  Resp 20 10/31/2019 1156  SpO2 96 % 10/21/2019 1156  Vitals shown include unvalidated device data.  Last Pain:  Vitals:   11/03/2019 1025  TempSrc:   PainSc: 0-No pain      Patients Stated Pain Goal: 0 (47/65/46 5035)  Complications: No complications documented.

## 2019-10-18 ENCOUNTER — Inpatient Hospital Stay (HOSPITAL_COMMUNITY): Payer: Medicaid Other

## 2019-10-18 ENCOUNTER — Encounter (HOSPITAL_COMMUNITY): Payer: Self-pay | Admitting: Orthopedic Surgery

## 2019-10-18 DIAGNOSIS — I469 Cardiac arrest, cause unspecified: Secondary | ICD-10-CM

## 2019-10-18 DIAGNOSIS — L8990 Pressure ulcer of unspecified site, unspecified stage: Secondary | ICD-10-CM | POA: Insufficient documentation

## 2019-10-18 DIAGNOSIS — L089 Local infection of the skin and subcutaneous tissue, unspecified: Secondary | ICD-10-CM | POA: Diagnosis not present

## 2019-10-18 DIAGNOSIS — M7989 Other specified soft tissue disorders: Secondary | ICD-10-CM | POA: Diagnosis not present

## 2019-10-18 DIAGNOSIS — N179 Acute kidney failure, unspecified: Secondary | ICD-10-CM | POA: Diagnosis not present

## 2019-10-18 DIAGNOSIS — R7989 Other specified abnormal findings of blood chemistry: Secondary | ICD-10-CM | POA: Diagnosis not present

## 2019-10-18 DIAGNOSIS — E11628 Type 2 diabetes mellitus with other skin complications: Secondary | ICD-10-CM | POA: Diagnosis not present

## 2019-10-18 LAB — COMPREHENSIVE METABOLIC PANEL
ALT: 394 U/L — ABNORMAL HIGH (ref 0–44)
ALT: 424 U/L — ABNORMAL HIGH (ref 0–44)
AST: 1728 U/L — ABNORMAL HIGH (ref 15–41)
AST: 1739 U/L — ABNORMAL HIGH (ref 15–41)
Albumin: 1.4 g/dL — ABNORMAL LOW (ref 3.5–5.0)
Albumin: 1.5 g/dL — ABNORMAL LOW (ref 3.5–5.0)
Alkaline Phosphatase: 317 U/L — ABNORMAL HIGH (ref 38–126)
Alkaline Phosphatase: 337 U/L — ABNORMAL HIGH (ref 38–126)
Anion gap: 14 (ref 5–15)
Anion gap: 15 (ref 5–15)
BUN: 62 mg/dL — ABNORMAL HIGH (ref 6–20)
BUN: 63 mg/dL — ABNORMAL HIGH (ref 6–20)
CO2: 18 mmol/L — ABNORMAL LOW (ref 22–32)
CO2: 19 mmol/L — ABNORMAL LOW (ref 22–32)
Calcium: 7.4 mg/dL — ABNORMAL LOW (ref 8.9–10.3)
Calcium: 7.6 mg/dL — ABNORMAL LOW (ref 8.9–10.3)
Chloride: 107 mmol/L (ref 98–111)
Chloride: 105 mmol/L (ref 98–111)
Creatinine, Ser: 3.97 mg/dL — ABNORMAL HIGH (ref 0.44–1.00)
Creatinine, Ser: 4.03 mg/dL — ABNORMAL HIGH (ref 0.44–1.00)
GFR, Estimated: 13 mL/min — ABNORMAL LOW (ref >60)
GFR, Estimated: 13 mL/min — ABNORMAL LOW (ref >60)
Glucose, Bld: 119 mg/dL — ABNORMAL HIGH (ref 70–99)
Glucose, Bld: 112 mg/dL — ABNORMAL HIGH (ref 70–99)
Potassium: 4.2 mmol/L (ref 3.5–5.1)
Potassium: 4.1 mmol/L (ref 3.5–5.1)
Sodium: 139 mmol/L (ref 135–145)
Sodium: 139 mmol/L (ref 135–145)
Total Bilirubin: 1.7 mg/dL — ABNORMAL HIGH (ref 0.3–1.2)
Total Bilirubin: 2 mg/dL — ABNORMAL HIGH (ref 0.3–1.2)
Total Protein: 5.5 g/dL — ABNORMAL LOW (ref 6.5–8.1)
Total Protein: 5.6 g/dL — ABNORMAL LOW (ref 6.5–8.1)

## 2019-10-18 LAB — CBC
HCT: 24.1 % — ABNORMAL LOW (ref 36.0–46.0)
HCT: 24.2 % — ABNORMAL LOW (ref 36.0–46.0)
Hemoglobin: 7.8 g/dL — ABNORMAL LOW (ref 12.0–15.0)
Hemoglobin: 8 g/dL — ABNORMAL LOW (ref 12.0–15.0)
MCH: 25.7 pg — ABNORMAL LOW (ref 26.0–34.0)
MCH: 26.5 pg (ref 26.0–34.0)
MCHC: 32.4 g/dL (ref 30.0–36.0)
MCHC: 33.1 g/dL (ref 30.0–36.0)
MCV: 79.5 fL — ABNORMAL LOW (ref 80.0–100.0)
MCV: 80.1 fL (ref 80.0–100.0)
Platelets: 137 10*3/uL — ABNORMAL LOW (ref 150–400)
Platelets: 138 10*3/uL — ABNORMAL LOW (ref 150–400)
RBC: 3.03 MIL/uL — ABNORMAL LOW (ref 3.87–5.11)
RBC: 3.02 MIL/uL — ABNORMAL LOW (ref 3.87–5.11)
RDW: 22.2 % — ABNORMAL HIGH (ref 11.5–15.5)
RDW: 22.5 % — ABNORMAL HIGH (ref 11.5–15.5)
WBC: 20.1 10*3/uL — ABNORMAL HIGH (ref 4.0–10.5)
WBC: 18.3 10*3/uL — ABNORMAL HIGH (ref 4.0–10.5)
nRBC: 0.1 % (ref 0.0–0.2)
nRBC: 0.1 % (ref 0.0–0.2)

## 2019-10-18 LAB — MAGNESIUM: Magnesium: 1.7 mg/dL (ref 1.7–2.4)

## 2019-10-18 LAB — ECHOCARDIOGRAM COMPLETE
Area-P 1/2: 3.42 cm2
S' Lateral: 3.76 cm

## 2019-10-18 LAB — TROPONIN I (HIGH SENSITIVITY)
Troponin I (High Sensitivity): 137 ng/L (ref <18)
Troponin I (High Sensitivity): 144 ng/L (ref <18)
Troponin I (High Sensitivity): 128 ng/L (ref <18)

## 2019-10-18 LAB — GLUCOSE, CAPILLARY
Glucose-Capillary: 71 mg/dL (ref 70–99)
Glucose-Capillary: 107 mg/dL — ABNORMAL HIGH (ref 70–99)
Glucose-Capillary: 102 mg/dL — ABNORMAL HIGH (ref 70–99)
Glucose-Capillary: 103 mg/dL — ABNORMAL HIGH (ref 70–99)
Glucose-Capillary: 116 mg/dL — ABNORMAL HIGH (ref 70–99)
Glucose-Capillary: 124 mg/dL — ABNORMAL HIGH (ref 70–99)
Glucose-Capillary: 141 mg/dL — ABNORMAL HIGH (ref 70–99)

## 2019-10-18 LAB — PHOSPHORUS: Phosphorus: 6 mg/dL — ABNORMAL HIGH (ref 2.5–4.6)

## 2019-10-18 LAB — PROTIME-INR
INR: 2 — ABNORMAL HIGH (ref 0.8–1.2)
Prothrombin Time: 21.6 seconds — ABNORMAL HIGH (ref 11.4–15.2)

## 2019-10-18 LAB — LACTIC ACID, PLASMA: Lactic Acid, Venous: 1.2 mmol/L (ref 0.5–1.9)

## 2019-10-18 LAB — CK: Total CK: 119 U/L (ref 38–234)

## 2019-10-18 LAB — D-DIMER, QUANTITATIVE: D-Dimer, Quant: >20 ug/mL-FEU — ABNORMAL HIGH (ref 0.00–0.50)

## 2019-10-18 MED ORDER — FENTANYL 2500MCG IN NS 250ML (10MCG/ML) PREMIX INFUSION
0.0000 ug/h | INTRAVENOUS | Status: DC
  Administered 2019-10-18: 50 ug/h via INTRAVENOUS
  Filled 2019-10-18: qty 250

## 2019-10-18 MED ORDER — SODIUM CHLORIDE 0.9% FLUSH: 10.0000 mL | INTRAVENOUS | Status: DC | PRN

## 2019-10-18 MED ORDER — PROSOURCE TF PO LIQD
45.0000 mL | Freq: Four times a day (QID) | ORAL | Status: DC
  Administered 2019-10-18 – 2019-10-21 (×11): 45 mL
  Filled 2019-10-18 (×11): qty 45

## 2019-10-18 MED ORDER — ACETAMINOPHEN 500 MG PO TABS: 1000.0000 mg | ORAL_TABLET | Freq: Four times a day (QID) | ORAL | Status: DC | PRN

## 2019-10-18 MED ORDER — LABETALOL HCL 5 MG/ML IV SOLN
10.0000 mg | INTRAVENOUS | Status: DC | PRN
  Administered 2019-10-18 – 2019-10-21 (×4): 10 mg via INTRAVENOUS
  Filled 2019-10-18 (×4): qty 4

## 2019-10-18 MED ORDER — HYDRALAZINE HCL 20 MG/ML IJ SOLN
INTRAMUSCULAR | Status: AC
  Administered 2019-10-18: 20 mg
  Filled 2019-10-18: qty 1

## 2019-10-18 MED ORDER — LINACLOTIDE 72 MCG PO CAPS
72.0000 ug | ORAL_CAPSULE | Freq: Every day | ORAL | Status: DC
  Administered 2019-10-18 – 2019-10-21 (×4): 72 ug
  Filled 2019-10-18 (×4): qty 1

## 2019-10-18 MED ORDER — SODIUM CHLORIDE 0.9% FLUSH
10.0000 mL | Freq: Two times a day (BID) | INTRAVENOUS | Status: DC
  Administered 2019-10-18 – 2019-10-19 (×3): 10 mL
  Administered 2019-10-19: 30 mL
  Administered 2019-10-20: 10 mL
  Administered 2019-10-20: 30 mL
  Administered 2019-10-21: 10 mL

## 2019-10-18 MED ORDER — SORBITOL 70 % SOLN
30.0000 mL | Freq: Every day | Status: DC | PRN
  Filled 2019-10-18: qty 30

## 2019-10-18 MED ORDER — HYDRALAZINE HCL 20 MG/ML IJ SOLN
10.0000 mg | INTRAMUSCULAR | Status: DC | PRN
  Administered 2019-10-20 – 2019-10-21 (×4): 10 mg via INTRAVENOUS
  Filled 2019-10-18 (×6): qty 1

## 2019-10-18 MED ORDER — POLYETHYLENE GLYCOL 3350 17 G PO PACK: 17.0000 g | PACK | Freq: Every day | ORAL | Status: DC | PRN

## 2019-10-18 MED ORDER — VITAL 1.5 CAL PO LIQD
1000.0000 mL | ORAL | Status: DC
  Administered 2019-10-18 – 2019-10-20 (×2): 1000 mL
  Filled 2019-10-18 (×3): qty 1000

## 2019-10-18 MED ORDER — HYDRALAZINE HCL 20 MG/ML IJ SOLN
5.0000 mg | INTRAMUSCULAR | Status: DC | PRN
  Administered 2019-10-18 (×2): 10 mg via INTRAVENOUS
  Filled 2019-10-18 (×2): qty 1

## 2019-10-18 NOTE — Progress Notes (Signed)
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  Intubated and transferred to ICU, PCCM consulted.  History of present illness   HPI obtained from medical chart review as patient is unresponsive and intubated on mechanical ventilation.  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  Admitted to [facility] on 10/8 for left foot infection and started on empiric vancomycin, cefepime, and flagyl. Transferred to [facility] and Orthopedics consulted. Nephrology consulted given her ongoing AKI from prior September admission.  Found on blood cultures to have E. Coli bacteremia and was deescalated to Unasyn 10/11.  She was taken on 83/33 for uncomplicated left BKA and returned to the floor.  She was given dilaudid 1mg  for pain at 1500.  Then, staff found her apneic and pulseless around 1700 on a blood CBG check.  Unknown down time.  ACLS measures initiated.  ROSC obtained after 4 minutes.  She was intubated by anesthesia and transferred to the ICU.  PCCM consulted.    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 Sharol Given Nephrology   Procedures:  10/13 left BKA 10/13  ETT >>  Significant Diagnostic Tests:  10/9 CT left foot/ tib/ fib >> 1. Interval medial forefoot amputation with resection of the 1st metatarsal and great toe and amputation through the mid 2nd metatarsal. 2. Extensive inflammatory changes in the forefoot and midfoot with multifocal ill-defined fluid collections and air bubbles suspicious for recurrent soft tissue infection. 3. New multifocal osseous fragmentation/fractures involving the 3rd and 4th proximal phalangeal bases, 3rd metatarsal base and distal cuboid. There is lateral dislocation at the 3rd MTP joint. These findings could be neuropathic, but given the close approximation to the soft tissue findings and the rapid development, osteomyelitis cannot be excluded. 4. No acute osseous findings or bone destruction identified within the hindfoot or lower leg.  Micro Data:  10/9 SARS 2/ flu >> neg 10/8 BCx 2 >> E. Coli 10/13 MRSA PCR >>  Antimicrobials:  10/8 vanc  10/8 cefepime >> 10/10 10/8 clindamycin  10/9 flagyl >> > 10/10 10/11 Unasyn >> 10/13 cefazolin preop  Interim history/subjective:  SBP 1601-70. D/c levophed, starting hydralazine 10mg  PRN. UOP 655cc. Afebrile.  Objective   Blood pressure (!) 167/76, pulse 79, temperature 98.8 F (37.1 C), temperature source Oral, resp. rate (!) 30, height 5\' 6"  (1.676 m), weight 104.3 kg, SpO2 99 %.    Vent Mode: PRVC FiO2 (%):  [30 %-100 %] 30 % Set Rate:  [20 bmp] 20 bmp Vt Set:  [470 mL] 470 mL PEEP:  [5 cmH20] 5 cmH20 Plateau Pressure:  [22 cmH20-25 cmH20] 22 cmH20   Intake/Output Summary (Last 24 hours) at 10/18/2019 0752 Last data filed at 10/18/2019  0700 Gross per 24 hour  Intake 2194.66 ml  Output 805 ml  Net 1389.66 ml   Filed Weights   10/23/2019 2224 10/20/2019 0932  Weight: 104.3 kg 104.3 kg    Examination: General: critically ill appearing, morbid obese female, intubated  HENT: NCAT, no scleral icterus  Lungs: breathing synchronously with vent,  ctab Cardiovascular: S1 and S2 present, RR Abdomen: soft, non tender, bowel sounds present  Extremities: left BKA, 2+ pitting edema right LE Neuro: corneal reflex present, bilateral pupils upwards deviation, PERRLA 41mm bilaterally, sluggish, does not withdraw to pain in upper or LE Skin: warm and dry, no rashes   Resolved Hospital Problem list     Assessment & Plan:   PEA cardiac arrest due to acidemia 2/2 AKI on CKD, e.coli bacteremia, recent surgery, opiate use  SOFA score 15, indicating mortality 90%   -Aim for normothermia  -Monitor fever curve, avoid fevers -Tylenol PRN   -F/u echo  -Serial neuro exams -CT head +/- MRI when more stable  -Neuro evaluation  -GOC discussion with family  Acute hypercarbic/ hypoxic respiratory failure  -Continue mechanical ventilation -Sedation-fentanyl, wean as able  -F/u ABG/ CXRs -VAP bundle  -PPI   Metabolic acidosis  CKD stage IIIb with prolonged AKI -Continue Bicarb gtt -Strict I/Os -Daily weights -Daily CMP  -Nephrology following appreciate recs, already following given prior high risk for dialysis but given new event, renal function will likely deteriorate. Will need to consider neuro recovery prior to determining if dialysis would be offered  Shock liver, post arrest  -Monitor with CMP, INR -Consider GI consult if worsening  E. 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 -Ortho following appreciate recs   DMT2, poorly controlled -CBG q4H -SSI sensitive, hold lantus   Normocytic anemia Likely 2/2 chronic disease and critical illness -Monitor Hb -Transfusion threshold 7  HLD -Hold crestor  Bipolar Disorder -Hold Cymbalta   Chronic pain/ narcotic dependence  -Hold lyrica  -Prn fentanyl as needed    Best practice:  Diet: NPO, OG tube Pain/Anxiety/Delirium protocol (if indicated): fentanyl  VAP protocol (if  indicated): yes DVT prophylaxis: heparin  GI prophylaxis: protonix  Glucose control: sSSI Mobility: bed rest Code Status: FULL  Family Communication:  Disposition: ICU   Labs   CBC: Recent Labs  Lab 10/15/2019 2247 10/13/19 1558 10/15/19 0704 10/15/19 0704 10/16/19 0424 10/16/19 0424 10/16/19 2245 10/11/2019 0200 10/27/2019 2048 10/18/19 0105 10/18/19 0325  WBC 4.1   < > 13.1*  --  14.3*  --   --  21.9*  --  20.1* 18.3*  NEUTROABS 3.6  --   --   --   --   --   --   --   --   --   --   HGB 7.8*   < > 8.3*   < > 7.4*   < > 8.4* 8.1* 8.5* 7.8* 8.0*  HCT 26.0*   < > 26.2*   < > 23.0*   < > 26.4* 25.4* 25.0* 24.1* 24.2*  MCV 80.5   < > 82.1  --  82.1  --   --  81.4  --  79.5* 80.1  PLT 210   < > 180  --  167  --   --  170  --  137* 138*   < > = values in this interval not displayed.    Basic Metabolic Panel: Recent Labs  Lab 10/15/19 0704 10/15/19 0704 10/16/19 0424 11/03/2019 0200 10/24/2019 2048 10/18/19 0105 10/18/19 0325  NA 134*   < > 135 135 139 139 139  K 4.3   < > 4.4 4.3 4.6 4.2 4.1  CL 104  --  105 106  --  107 105  CO2 18*  --  16* 20*  --  18* 19*  GLUCOSE 152*  --  100* 90  --  119* 112*  BUN 63*  --  66* 61*  --  62* 63*  CREATININE 4.68*  --  4.31* 3.91*  --  3.97* 4.03*  CALCIUM 8.4*  --  8.1* 8.2*  --  7.4* 7.6*   < > = values in this interval not displayed.   GFR: Estimated Creatinine Clearance: 22.4 mL/min (A) (by C-G formula based on SCr of 4.03 mg/dL (H)). Recent Labs  Lab 10/21/2019 2246 10/16/2019 2247 10/13/19 0049 10/13/19 0411 10/13/19 1558 10/16/19 0424 10/08/2019 0200 10/18/19 0105 10/18/19 0325  WBC  --    < >  --   --    < > 14.3* 21.9* 20.1* 18.3*  LATICACIDVEN 3.7*  --  4.5* 3.2*  --   --   --  1.2  --    < > = values in this interval not displayed.    Liver Function Tests: Recent Labs  Lab 10/15/19 0704 10/16/19 0424 11/04/2019 0200 10/18/19 0105 10/18/19 0325  AST 37 24 14* 1,728* 1,739*  ALT 38 25 19 394* 424*  ALKPHOS 265*  329* 322* 317* 337*  BILITOT 0.8 1.0 0.6 1.7* 2.0*  PROT 6.6 5.8* 6.1* 5.5* 5.6*  ALBUMIN 1.9* 1.6* 1.5* 1.4* 1.5*   No results for input(s): LIPASE, AMYLASE in the last 168 hours. No results for input(s): AMMONIA in the last 168 hours.  ABG    Component Value Date/Time   PHART 7.402 11/04/2019 2048   PCO2ART 30.0 (L) 10/16/2019 2048   PO2ART 151 (H) 10/15/2019 2048   HCO3 18.7 (L) 10/30/2019 2048   TCO2 20 (L) 10/21/2019 2048   ACIDBASEDEF 5.0 (H) 10/31/2019 2048   O2SAT 99.0 10/08/2019 2048     Coagulation Profile: Recent Labs  Lab 10/11/2019 2252 10/18/19 0105  INR 1.3* 2.0*    Cardiac Enzymes: No results for input(s): CKTOTAL, CKMB, CKMBINDEX, TROPONINI in the last 168 hours.  HbA1C: Hemoglobin A1C  Date/Time Value Ref Range Status  03/12/2016 12:00 AM 14  Final   Hgb A1c MFr Bld  Date/Time Value Ref Range Status  09/13/2019 06:44 PM 9.0 (H) 4.8 - 5.6 % Final    Comment:    (NOTE) Pre diabetes:          5.7%-6.4%  Diabetes:              >6.4%  Glycemic control for   <7.0% adults with diabetes   11/25/2018 06:44 AM 8.3 (H) 4.8 - 5.6 % Final    Comment:    (NOTE) Pre diabetes:          5.7%-6.4% Diabetes:              >6.4% Glycemic control for   <7.0% adults with diabetes     CBG: Recent Labs  Lab 10/05/2019 1709 10/11/2019 1913 10/26/2019 2315 10/18/19 0306 10/18/19 0748  GLUCAP 72 93 120* 107* 102*    Review of Systems:     Past Medical History  She,  has a past medical history of Anxiety, Arthritis, Bipolar disorder ([place]), Diabetes ([place]) (06/05/2015), Diabetes  mellitus, Fibromyalgia, History of abnormal cervical Pap smear (05/29/2015), History of diabetes mellitus, type II (05/29/2015), Hypertension, Neuropathy, Obesity (05/29/2015), Osteoporosis, Ovarian cyst (02/20/2016), Ovarian cyst (02/20/2016), Panic attacks, Pneumonia, Stroke ([place]), Superficial fungus infection of skin (05/29/2015), and Vitamin D deficiency (06/05/2015).   Surgical History     Past Surgical History:  Procedure Laterality Date  . AMPUTATION Left 09/17/2019   Procedure: AMPUTATION LEFT GREAT TOE/ RAY WITH WOUND VAC APPLICATION;  Surgeon: Leandrew Koyanagi, MD;  Location: [place];  Service: Orthopedics;  Laterality: Left;  . AMPUTATION Left 09/19/2019   Procedure: LEFT FOOT 1ST RAY AMPUTATION;  Surgeon: Newt Minion, MD;  Location: [place];  Service: Orthopedics;  Laterality: Left;  . AMPUTATION Left 11/03/2019   Procedure: LEFT BELOW KNEE AMPUTATION;  Surgeon: Newt Minion, MD;  Location: [place];  Service: Orthopedics;  Laterality: Left;  . CESAREAN SECTION    . HERNIA REPAIR    . IR FLUORO GUIDE CV LINE RIGHT  09/20/2019  . IR REMOVAL TUN CV CATH W/O FL  09/24/2019  . IR US GUIDE VASC ACCESS RIGHT  09/20/2019  . THROAT SURGERY       Social History   reports that she has been smoking cigarettes. She has a 6.50 pack-year smoking history. She has never used smokeless tobacco. She reports that she does not drink alcohol and does not use drugs.   Family History   Her family history includes Breast cancer in her maternal aunt, maternal grandmother, and mother; Cancer in her maternal aunt; Cancer (age of onset: 32) in her mother.   Allergies Allergies  Allergen Reactions  . Hydrocodone Itching  . Latex Itching and Rash     Home Medications  Prior to Admission medications   Medication Sig Start Date End Date Taking? Authorizing Provider  bismuth subsalicylate (PEPTO BISMOL) 262 MG/15ML suspension Take 30 mLs by mouth daily as needed for indigestion.    [provider]  bisoprolol-hydrochlorothiazide (ZIAC) 5-6.25 MG tablet Take 1 tablet by mouth daily. 06/21/19   [provider]  clonazePAM (KLONOPIN) 0.5 MG tablet Take 0.5 mg by mouth 2 (two) times daily as needed for anxiety. 08/13/19   [provider]  cloNIDine (CATAPRES) 0.2 MG tablet Take 0.2 mg by mouth 2 (two) times daily.    [provider]  clotrimazole-betamethasone  (LOTRISONE) cream Apply 1 application topically 2 (two) times daily. 09/26/19   [provider]  diclofenac sodium (VOLTAREN) 1 % GEL Apply 2 g topically 4 (four) times daily as needed (pain).     [provider]  DULoxetine (CYMBALTA) 60 MG capsule Take 60 mg by mouth daily. 06/21/19   [provider]  furosemide (LASIX) 20 MG tablet Take 20 mg by mouth 2 (two) times daily as needed for edema. 04/21/19   [provider]  hydrOXYzine (ATARAX/VISTARIL) 50 MG tablet Take 50 mg by mouth 2 (two) times daily as needed for anxiety. 06/09/19   [provider]  Insulin Glargine (LANTUS SOLOSTAR) 100 UNIT/ML Solostar Pen Inject 80 Units into the skin at bedtime. Patient taking differently: Inject 80 Units into the skin 2 (two) times daily. Inject -80 units under the skin twice daily. 09/15/18   Florian Buff, MD  LINZESS 72 MCG capsule Take 72 mcg by mouth daily. 04/21/19   [provider]  lisinopril-hydrochlorothiazide (ZESTORETIC) 20-12.5 MG tablet Take 1 tablet by mouth daily. 04/21/19   [provider]  megestrol (MEGACE) 40 MG tablet Take 1 tablet (40  mg total) by mouth 3 (three) times daily as needed (regulate period). Patient not taking: Reported on 09/13/2019 12/05/18   Florian Buff, MD  NOVOLOG FLEXPEN 100 UNIT/ML FlexPen INJECT 30 UNITS SUBCUTANEOUSLY 3 TIMES A DAY WITH MEALS Patient not taking: Reported on 09/13/2019 09/07/19   Florian Buff, MD  oxyCODONE-acetaminophen (PERCOCET/ROXICET) 5-325 MG tablet Take 1 tablet by mouth every 6 (six) hours as needed for pain. 09/25/19   [provider]  pregabalin (LYRICA) 200 MG capsule Take 200 mg by mouth 3 (three) times daily.     [provider]  PROAIR HFA 108 (90 Base) MCG/ACT inhaler Inhale 1 puff into the lungs 4 (four) times daily as needed for wheezing or shortness of breath. 08/15/19   [provider]  rosuvastatin (CRESTOR) 20 MG tablet Take 20 mg by mouth at bedtime.  06/21/19   [provider]  silver sulfADIAZINE (SILVADENE) 1 % cream APPLY TO AFFECTED AREA 2 OR 3 TIMES DAILY Patient taking differently: Apply 1 application topically 3 (three) times daily. APPLY TO AFFECTED AREA 2 OR 3 TIMES DAILY 03/30/19   Florian Buff, MD  SUBOXONE 8-2 MG FILM Place 2 Film under the tongue daily.  11/17/18   [provider]  torsemide (DEMADEX) 10 MG tablet Take 10 mg by mouth daily. 06/21/19   [provider]     Critical care time:

## 2019-10-18 NOTE — Progress Notes (Signed)
Lower extremity venous bilateral study completed.   Please see CV Proc for preliminary results.   Taiwan Millon, RDMS  

## 2019-10-18 NOTE — Progress Notes (Signed)
eLink Physician-Brief Progress Note Patient Name: Felicia Reynolds DOB: April 04, 1980 MRN: [id number]   Date of Service  10/18/2019  HPI/Events of Note  Sub-optimal blood pressure control.  eICU Interventions  Hydralazine 5-10 mg iv Q 4 hours PRN SBP > 160 mmHg.        Felicia Reynolds 10/18/2019, 12:32 AM

## 2019-10-18 NOTE — Progress Notes (Signed)
EEG complete - results pending 

## 2019-10-18 NOTE — Progress Notes (Signed)
eLink Physician-Brief Progress Note Patient Name: Felicia Reynolds DOB: April 08, 1980 MRN: [id number]   Date of Service  10/18/2019  HPI/Events of Note  D-dimer > 20, maybe acute phase reactant secondary to osteomyelitis but lower extremity DVT needs to be ruled out.  eICU Interventions  Bilateral lower extremity ultrasound ordered to r/o DVT.        [name] 10/18/2019, 2:26 AM

## 2019-10-18 NOTE — Procedures (Addendum)
Patient Name: Felicia Reynolds  MRN: [id number]  Epilepsy Attending: Lora Havens  Referring Physician/Provider: Dr. Lattie Haw Date: 10/18/2027 Duration: 25.48 mins  Patient history: 39 year old female status post cardiac arrest.  EEG to evaluate for seizures.   Level of alertness: Awake, asleep/lethargic  AEDs during EEG study: None  Technical aspects: This EEG study was done with scalp electrodes positioned according to the 10-20 International system of electrode placement. Electrical activity was acquired at a sampling rate of 500Hz  and reviewed with a high frequency filter of 70Hz  and a low frequency filter of 1Hz . EEG data were recorded continuously and digitally stored.   Description: During awake state, no posterior dominant rhythm was seen.  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  EEG was reactive to tactile stimulation.  Hyperventilation and photic stimulation were not performed.     ABNORMALITY -Periodic epileptiform discharges with triphasic morphology, generalized -Background attenuation, general  IMPRESSION: This study showed periodic epileptiform discharges with triphasic morphology at 1 Hz consistent with generalized epileptogenic city as well as profound diffuse encephalopathy.  This pattern can be seen in diffuse anoxic/hypoxic brain injury.  No seizures were seen throughout the recording.  Arkeem Harts Barbra Sarks

## 2019-10-18 NOTE — Progress Notes (Signed)
Admit: 10/29/2019 LOS: 5  Felicia Reynolds with evidence of CKD 3 until presentation in September for diabetic foot ulcer requiring toe amputation and progressive AKI eventually requiring dialysis; she left 9/20 AMA and returns with a creatinine very similar to at the time of discharge.  Since admission her creatinine has worsened to 4.7.   Subjective:   Cardiac arrest yesterday in afteroon  Unclear how long was pulseless  Now in 33M, VDRF  BP ok  No colling  UOP 0.7L, SCr stable, K 4.1  10/13 0701 - 10/14 0700 In: 2194.7 [I.V.:1992.3; IV Piggyback:202.4] Out: 805 [Urine:655; Blood:150]  Filed Weights   10/06/2019 2224 10/16/2019 0932  Weight: 104.3 kg 104.3 kg    Scheduled Meds:  sodium chloride   Intravenous Once   sodium chloride   Intravenous Once   chlorhexidine gluconate (MEDLINE KIT)  15 mL Mouth Rinse BID   Chlorhexidine Gluconate Cloth  6 each Topical Daily   heparin  5,000 Units Subcutaneous Q8H   insulin aspart  0-9 Units Subcutaneous Q4H   linaclotide  72 mcg Oral Daily   mouth rinse  15 mL Mouth Rinse 10 times per day   pantoprazole (PROTONIX) IV  40 mg Intravenous Q24H   sodium chloride flush  10-40 mL Intracatheter Q12H   sodium chloride flush  3 mL Intravenous Q12H   Continuous Infusions:  sodium chloride     sodium chloride 10 mL/hr at 11/03/2019 0949   sodium chloride Stopped (10/24/2019 1725)   ampicillin-sulbactam (UNASYN) IV Stopped (10/05/2019 2357)   fentaNYL infusion INTRAVENOUS 200 mcg/hr (10/18/19 0900)    sodium bicarbonate (isotonic) infusion in sterile water 100 mL/hr at 10/18/19 0900   PRN Meds:.sodium chloride, albuterol, hydrALAZINE, labetalol, polyethylene glycol, sodium chloride flush, sodium chloride flush, sorbitol  Current Labs: reviewed    Physical Exam:  Blood pressure (!) 172/94, pulse 78, temperature 98.8 F (37.1 C), temperature source Oral, resp. rate (!) 30, height _0  (1.676 m), weight 104.3 kg, SpO2 99 %. GEN: NAD,  chronically ill-appearing, sedated  ENT: NCAT, +ETT EYES: EOMI CV: Regular, normal S1 and S2 PULM: coarse b/l ABD: Soft, nontender SKIN: Left foot bandaged, not examined EXT: No significant edema NEURO: Nonfocal  A 1. Prolonged AKI, no immediate indication to resume dialysis; see how she does in post arrest phase, so far UOP ok and GFR low but stable 2. Cardiac arrest 10/18/2019 3. Acidosis, sig resp during #2, now mild metabolic, on NaHCO3 gtt per CCM 4. E. coli bacteremia, per primary related to #3, unasyn 5. Diabetic foot ulcer, dehisced left toe amputation site; s/p L BKA 10/28/2019 6. DM2, uncontrolled 7. Chronic pain 8. Hypertension, BP stable 9. Anemia, chronic, multifactorial; asymptomatic  P 1. Continue supportive care, ok with hydration but might not need NaHCO3, LR would be ok as well 2. High risk of developing recurrent dialysis dependence, not currently indicated 3. Daily weights, Daily Renal Panel, Strict I/Os, Avoid nephrotoxins (NSAIDs, judicious IV Contrast)    Medication Issues; o Preferred narcotic agents for pain control are hydromorphone, fentanyl, and methadone. Morphine should not be used.  o Baclofen should be avoided o Avoid oral sodium phosphate and magnesium citrate based laxatives / bowel preps    Pearson Grippe MD 10/18/2019, 10:16 AM  Recent Labs  Lab 10/11/2019 0200 10/27/2019 0200 10/13/2019 2048 10/18/19 0105 10/18/19 0325  NA 135   < > 139 139 139  K 4.3   < > 4.6 4.2 4.1  CL 106  --   --  107 105  CO2 20*  --   --  18* 19*  GLUCOSE 90  --   --  119* 112*  BUN 61*  --   --  62* 63*  CREATININE 3.91*  --   --  3.97* 4.03*  CALCIUM 8.2*  --   --  7.4* 7.6*   < > = values in this interval not displayed.   Recent Labs  Lab 10/09/2019 2247 10/13/19 1558 11/02/2019 0200 10/16/2019 0200 10/30/2019 2048 10/18/19 0105 10/18/19 0325  WBC 4.1   < > 21.9*  --   --  20.1* 18.3*  NEUTROABS 3.6  --   --   --   --   --   --   HGB 7.8*   < > 8.1*   < > 8.5*  7.8* 8.0*  HCT 26.0*   < > 25.4*   < > 25.0* 24.1* 24.2*  MCV 80.5   < > 81.4  --   --  79.5* 80.1  PLT 210   < > 170  --   --  137* 138*   < > = values in this interval not displayed.

## 2019-10-18 NOTE — Progress Notes (Signed)
Patient ID: Felicia Reynolds, female   DOB: [birth year], 38 y.o.   MRN: [id number] Postoperative day 1 left transtibial amputation.  Patient's amputation surgery was uneventful and not prolonged.  Patient currently being worked up for PE.  There is no drainage in the wound VAC canister there is a good suction fit.  No dressing changes needed for the amputation.  Continue the wound VAC.

## 2019-10-18 NOTE — Progress Notes (Signed)
Arrived to room RN notified me she is about to go to CT will try back as schedule permits

## 2019-10-18 NOTE — Progress Notes (Signed)
  Echocardiogram 2D Echocardiogram has been performed.  Felicia Reynolds 10/18/2019, 11:31 AM

## 2019-10-18 NOTE — Progress Notes (Signed)
CRITICAL VALUE ALERT  Critical Value:  Troponin 137  Date & Time Notied:  10/18/19 2:36 AM   Provider Notified: Warren Lacy

## 2019-10-18 NOTE — Progress Notes (Signed)
Initial Nutrition Assessment  DOCUMENTATION CODES:   Not applicable  INTERVENTION:   Tube feeding:  -Vital 1.5 @ 50 ml/hr via OG (1200 ml) -45 ml ProSource QID  Provides: 1960 kcals, 125 grams protein, 917 ml free water.   NUTRITION DIAGNOSIS:   Increased nutrient needs related to post-op healing as evidenced by estimated needs.  GOAL:   Patient will meet greater than or equal to 90% of their needs  MONITOR:   Labs, I & O's, Diet advancement, Vent status, Skin, TF tolerance, Weight trends  REASON FOR ASSESSMENT:   Consult, Ventilator Enteral/tube feeding initiation and management  ASSESSMENT:   Patient with PMH significant for DM, CKD III, essential HTN, chronic narcotic dependence, diabetic neuropathy, and L foot osteomyelitis. Presents this admission with L foot infection with associated bacteremia.   10/13- L BKA, cardiac arrest, intubated   Normothermia. Off pressors. Awaiting results of head CT to rule out bleeding. Prolonged AKI, no immediate indication for CRRT at this time. Xray read OG below the diaphragm. Okay to start feeding per CCM.   Weight shows to remain stable over the last year. Utilize 104.3 kg as EDW for now.   Patient is currently intubated on ventilator support MV: 11.2 L/min Temp (24hrs), Avg:98.5 F (36.9 C), Min:97.5 F (36.4 C), Max:98.8 F (37.1 C)   UOP: 655 ml x 24 hrs   Medications: SS novolog Labs: Cr 4.03-up from this am LFTs elevated CBG 103-120  Diet Order:   Diet Order            Diet NPO time specified  Diet effective now                 EDUCATION NEEDS:   Not appropriate for education at this time  Skin:     Last BM:     Height:   Ht Readings from Last 1 Encounters:  10/24/2019 5\' 6"  (1.676 m)    Weight:   Wt Readings from Last 1 Encounters:  11/01/2019 104.3 kg    Ideal Body Weight:  55.2 kg (adjusted L BKA)  BMI:  Body mass index is 37.12 kg/m.  Estimated Nutritional Needs:   Kcal:  1955  kcal  Protein:  115-130 g  Fluid:  >/= 1.9 L/day  Mariana Single RD, LDN Clinical Nutrition Pager listed in [place]

## 2019-10-18 NOTE — Progress Notes (Signed)
Pt transferred to and from CT scan on ventilator without complications.

## 2019-10-18 NOTE — Progress Notes (Addendum)
eLink Physician-Brief Progress Note Patient Name: LAUREL HARNDEN DOB: 09-26-1980 MRN: [id number]   Date of Service  10/18/2019  HPI/Events of Note  CMET with elevated LFT's probably delayed ischemic damage to the liver s/p cardiac arrest. Troponin 137 likely for the same reason.  eICU Interventions  Tylenol discontinued. Will cycle Troponin.        Kerry Kass Tawnee Clegg 10/18/2019, 3:16 AM

## 2019-10-18 NOTE — Progress Notes (Signed)
eLink Physician-Brief Progress Note Patient Name: LENICE KOPER DOB: February 28, 1980 MRN: [id number]   Date of Service  10/18/2019  HPI/Events of Note  Post-operative BKA pain.  eICU Interventions  Low dose fentanyl infusion started, Tylenol 1000 mg via NG tube Q 6 hours PRN pain x 6 doses.     Intervention Category Intermediate Interventions: Pain - evaluation and management  Heavan Francom U Mallory Schaad 10/18/2019, 1:56 AM

## 2019-10-18 NOTE — Progress Notes (Signed)
PT Cancellation Note  Patient Details Name: Felicia Reynolds MRN: [id number] DOB: 22-Jun-1980   Cancelled Treatment:    Reason Eval/Treat Not Completed: Medical issues which prohibited therapy.  Pt still needs to have DVT R/O and per RN and visual inspection, pt will not be able to participate with therapies today.  She is not even acknowleging painful stimuli per RN. 10/18/2019  Ginger Carne., PT Acute Rehabilitation Services [phone]  (pager) [phone]  (office)   Tessie Fass Tauni Sanks 10/18/2019, 12:25 PM
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
## 2019-10-18 NOTE — Progress Notes (Signed)
OT Cancellation Note  Patient Details Name: Felicia Reynolds MRN: [id number] DOB: [birth year]   Cancelled Treatment:    Reason Eval/Treat Not Completed: Medical issues which prohibited therapy- per critical care elevated D-dimer with ultrasound ordered for B LE to rule out DVT.  Will hold until cleared.    Jolaine Artist, OT Acute Rehabilitation Services Pager [phone] Office [phone]   [name] 10/18/2019, 7:45 AM

## 2019-10-19 DIAGNOSIS — I469 Cardiac arrest, cause unspecified: Secondary | ICD-10-CM | POA: Diagnosis not present

## 2019-10-19 DIAGNOSIS — N179 Acute kidney failure, unspecified: Secondary | ICD-10-CM | POA: Diagnosis not present

## 2019-10-19 DIAGNOSIS — E11628 Type 2 diabetes mellitus with other skin complications: Secondary | ICD-10-CM | POA: Diagnosis not present

## 2019-10-19 DIAGNOSIS — L089 Local infection of the skin and subcutaneous tissue, unspecified: Secondary | ICD-10-CM | POA: Diagnosis not present

## 2019-10-19 LAB — CBC
HCT: 23.1 % — ABNORMAL LOW (ref 36.0–46.0)
Hemoglobin: 7.6 g/dL — ABNORMAL LOW (ref 12.0–15.0)
MCH: 26 pg (ref 26.0–34.0)
MCHC: 32.9 g/dL (ref 30.0–36.0)
MCV: 79.1 fL — ABNORMAL LOW (ref 80.0–100.0)
Platelets: 166 10*3/uL (ref 150–400)
RBC: 2.92 MIL/uL — ABNORMAL LOW (ref 3.87–5.11)
RDW: 22.8 % — ABNORMAL HIGH (ref 11.5–15.5)
WBC: 14.9 10*3/uL — ABNORMAL HIGH (ref 4.0–10.5)
nRBC: 0 % (ref 0.0–0.2)

## 2019-10-19 LAB — COMPREHENSIVE METABOLIC PANEL
ALT: 191 U/L — ABNORMAL HIGH (ref 0–44)
AST: 429 U/L — ABNORMAL HIGH (ref 15–41)
Albumin: 1.4 g/dL — ABNORMAL LOW (ref 3.5–5.0)
Alkaline Phosphatase: 258 U/L — ABNORMAL HIGH (ref 38–126)
Anion gap: 11 (ref 5–15)
BUN: 68 mg/dL — ABNORMAL HIGH (ref 6–20)
CO2: 22 mmol/L (ref 22–32)
Calcium: 7.3 mg/dL — ABNORMAL LOW (ref 8.9–10.3)
Chloride: 106 mmol/L (ref 98–111)
Creatinine, Ser: 4.52 mg/dL — ABNORMAL HIGH (ref 0.44–1.00)
GFR, Estimated: 11 mL/min — ABNORMAL LOW (ref >60)
Glucose, Bld: 171 mg/dL — ABNORMAL HIGH (ref 70–99)
Potassium: 4.2 mmol/L (ref 3.5–5.1)
Sodium: 139 mmol/L (ref 135–145)
Total Bilirubin: 1 mg/dL (ref 0.3–1.2)
Total Protein: 5.3 g/dL — ABNORMAL LOW (ref 6.5–8.1)

## 2019-10-19 LAB — MAGNESIUM
Magnesium: 1.8 mg/dL (ref 1.7–2.4)
Magnesium: 1.8 mg/dL (ref 1.7–2.4)

## 2019-10-19 LAB — GLUCOSE, CAPILLARY
Glucose-Capillary: 173 mg/dL — ABNORMAL HIGH (ref 70–99)
Glucose-Capillary: 197 mg/dL — ABNORMAL HIGH (ref 70–99)
Glucose-Capillary: 214 mg/dL — ABNORMAL HIGH (ref 70–99)
Glucose-Capillary: 243 mg/dL — ABNORMAL HIGH (ref 70–99)
Glucose-Capillary: 266 mg/dL — ABNORMAL HIGH (ref 70–99)
Glucose-Capillary: 238 mg/dL — ABNORMAL HIGH (ref 70–99)

## 2019-10-19 LAB — PROTIME-INR
INR: 1.6 — ABNORMAL HIGH (ref 0.8–1.2)
Prothrombin Time: 18 seconds — ABNORMAL HIGH (ref 11.4–15.2)

## 2019-10-19 LAB — PHOSPHORUS
Phosphorus: 6.6 mg/dL — ABNORMAL HIGH (ref 2.5–4.6)
Phosphorus: 6.8 mg/dL — ABNORMAL HIGH (ref 2.5–4.6)

## 2019-10-19 LAB — SURGICAL PATHOLOGY

## 2019-10-19 MED ORDER — METOPROLOL TARTRATE 12.5 MG HALF TABLET
12.5000 mg | ORAL_TABLET | Freq: Two times a day (BID) | ORAL | Status: DC
  Administered 2019-10-19 – 2019-10-20 (×2): 12.5 mg
  Filled 2019-10-19 (×2): qty 1

## 2019-10-19 MED ORDER — METOPROLOL TARTRATE 12.5 MG HALF TABLET
12.5000 mg | ORAL_TABLET | Freq: Two times a day (BID) | ORAL | Status: DC
  Administered 2019-10-19: 12.5 mg via ORAL
  Filled 2019-10-19: qty 1

## 2019-10-19 NOTE — Progress Notes (Signed)
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 Intubated and transferred to ICU, PCCM consulted.  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 Sharol Given Nephrology  Procedures:  10/13 left BKA 10/13 ETT >>  Significant Diagnostic Tests:  10/9 CT left foot/ tib/ fib >> 1. Interval medial forefoot amputation with resection of the 1st metatarsal and great toe and amputation through the mid 2nd metatarsal. 2. Extensive inflammatory changes in the forefoot and midfoot with multifocal ill-defined fluid collections and air bubbles suspicious for recurrent soft tissue infection. 3. New multifocal osseous fragmentation/fractures involving the 3rd and 4th proximal phalangeal bases, 3rd metatarsal base and distal cuboid. There is lateral dislocation at the 3rd MTP joint. These findings could be neuropathic, but given the close approximation to the soft tissue findings and the rapid development, osteomyelitis cannot be excluded. 4. No acute osseous findings or bone destruction identified within the hindfoot or lower leg.  10/14 CT head > non contrast CT appearance of brain appears stable since last year, no evidence of anoxic brain injury  10/14 EEG > periodic epileptiform discharges with triphasic morphology consistent with profound diffuse encephalopathy, can be seen with anoxic brain injury.    Micro Data:   10/9 SARS 2/ flu >> neg 10/8 BCx 2 >> E. Coli 10/13 MRSA PCR >>  Antimicrobials:  10/8 vanc  10/8 cefepime >> 10/10 10/8 clindamycin  10/9 flagyl >> > 10/10 10/11 Unasyn >> 10/13 cefazolin preop  Interim history/subjective:  No significant change in her neurologic exam afebrile   Objective   Blood pressure (!) 147/69, pulse 91, temperature 99.1 F (37.3 C), temperature source Axillary, resp. rate (!) 25, height 5\' 6"  (1.676 m), weight 124.6 kg, SpO2 100 %.    Vent Mode: PRVC FiO2 (%):  [30 %] 30 % Set Rate:  [25 bmp] 25 bmp Vt Set:  [470 mL] 470 mL PEEP:  [5 cmH20] 5 cmH20 Plateau Pressure:  [17 [id number] cmH20] 21 cmH20   Intake/Output Summary (Last 24 hours) at 10/19/2019 3810 Last data filed at 10/19/2019 0900 Gross per 24 hour  Intake 1224.56 ml  Output 585 ml  Net 639.56 ml   Filed Weights   10/10/2019 2224 11/03/2019 0932 10/19/19 0415  Weight: 104.3 kg 104.3 kg 124.6 kg    Examination: General:  In bed on vent HENT: NCAT ETT in place PULM: CTA B, vent supported breathing CV: RRR, no mgr GI: BS+, soft, nontender MSK: normal bulk and tone Neuro: spontaneous eye opening but has fixed upward gaze, no corneal reflex, no motor response to pain, has a cough   Resolved Hospital Problem list     Assessment & Plan:  PEA cardiac arrest due to acidemia 2/2 AKI on CKD, septic shock from e.coli bacteremia, complicated by narcotic use Acute metabolic encephalopathy, picture worrisome for anoxic brain injury, EEG findings suggestive of that as is clinical picture Hold sedation Daily neuro exams Plan to have  family come this weekend to discuss goals of care  Acute hypoxemic respiratory failure due to inability to protect airway Full mechanical vent support VAP prevention Daily WUA/SBT when mental status allows  Acute metabolic acidosis Worsening AKI Monitor BMET and UOP Replace electrolytes as needed Not a good candidate for dialysis  Shock liver>  improving Monitor LFT  E. Coli bacteremia from diabetic foot infection S/p L BKA 10/13 Ten days of unasyn  Hypertension Add back scheduled metoprolol Hold ACE given renal failure Consider clonidine    Best practice:  Diet: tube feeding Pain/Anxiety/Delirium protocol (if indicated): RASS goal 0 VAP protocol (if indicated): yes DVT prophylaxis: sub cutaneous heparin GI prophylaxis: pantoprazole Glucose control: SSI Mobility: bed rest Code Status: DNR Family Communication: I called her daughter [name] for an update on 10/15, I asked them to come this weekend for goals of care conversation.  I informed her that her mother's prognosis seems poor and there is little chance she will regain neurologic recovery.  She states that her mother would not want to have prolonged. I suggested that we change code status to DNR and not escalate her care further.  She says that she is going to come on Sunday morning. She voiced understanding and understood our plan of care.   Disposition:   Labs   CBC: Recent Labs  Lab 11/04/2019 2247 10/13/19 1558 10/16/19 0424 10/16/19 2245 10/30/2019 0200 10/11/2019 2048 10/18/19 0105 10/18/19 0325 10/19/19 0414  WBC 4.1   < > 14.3*  --  21.9*  --  20.1* 18.3* 14.9*  NEUTROABS 3.6  --   --   --   --   --   --   --   --   HGB 7.8*   < > 7.4*   < > 8.1* 8.5* 7.8* 8.0* 7.6*  HCT 26.0*   < > 23.0*   < > 25.4* 25.0* 24.1* 24.2* 23.1*  MCV 80.5   < > 82.1  --  81.4  --  79.5* 80.1 79.1*  PLT 210   < > 167  --  170  --  137* 138* 166   < > = values in this interval not displayed.    Basic Metabolic Panel: Recent Labs  Lab 10/16/19 0424 10/16/19 0424 10/11/2019 0200 10/31/2019 2048 10/18/19 0105 10/18/19 0325 10/18/19 1630 10/19/19 0414  NA 135   < > 135 139 139 139  --  139  K 4.4   < > 4.3 4.6 4.2 4.1  --  4.2  CL 105  --  106  --  107 105  --  106  CO2 16*  --  20*  --  18* 19*  --  22  GLUCOSE 100*  --  90  --  119* 112*  --  171*  BUN 66*  --  61*   --  62* 63*  --  68*  CREATININE 4.31*  --  3.91*  --  3.97* 4.03*  --  4.52*  CALCIUM 8.1*  --  8.2*  --  7.4* 7.6*  --  7.3*  MG  --   --   --   --   --   --  1.7 1.8  PHOS  --   --   --   --   --   --  6.0* 6.6*   < > = values in this interval not displayed.   GFR: Estimated Creatinine Clearance: 22.1 mL/min (A) (by C-G formula based on SCr of 4.52  mg/dL (H)). Recent Labs  Lab 11/02/2019 2246 10/09/2019 2247 10/13/19 0049 10/13/19 0411 10/13/19 1558 11/01/2019 0200 10/18/19 0105 10/18/19 0325 10/19/19 0414  WBC  --    < >  --   --    < > 21.9* 20.1* 18.3* 14.9*  LATICACIDVEN 3.7*  --  4.5* 3.2*  --   --  1.2  --   --    < > = values in this interval not displayed.    Liver Function Tests: Recent Labs  Lab 10/16/19 0424 10/26/2019 0200 10/18/19 0105 10/18/19 0325 10/19/19 0414  AST 24 14* 1,728* 1,739* 429*  ALT 25 19 394* 424* 191*  ALKPHOS 329* 322* 317* 337* 258*  BILITOT 1.0 0.6 1.7* 2.0* 1.0  PROT 5.8* 6.1* 5.5* 5.6* 5.3*  ALBUMIN 1.6* 1.5* 1.4* 1.5* 1.4*   No results for input(s): LIPASE, AMYLASE in the last 168 hours. No results for input(s): AMMONIA in the last 168 hours.  ABG    Component Value Date/Time   PHART 7.402 10/31/2019 2048   PCO2ART 30.0 (L) 11/01/2019 2048   PO2ART 151 (H) 10/26/2019 2048   HCO3 18.7 (L) 10/29/2019 2048   TCO2 20 (L) 10/11/2019 2048   ACIDBASEDEF 5.0 (H) 10/24/2019 2048   O2SAT 99.0 10/19/2019 2048     Coagulation Profile: Recent Labs  Lab 10/18/2019 2252 10/18/19 0105 10/19/19 0414  INR 1.3* 2.0* 1.6*    Cardiac Enzymes: Recent Labs  Lab 10/18/19 0521  CKTOTAL 119    HbA1C: Hemoglobin A1C  Date/Time Value Ref Range Status  03/12/2016 12:00 AM 14  Final   Hgb A1c MFr Bld  Date/Time Value Ref Range Status  09/13/2019 06:44 PM 9.0 (H) 4.8 - 5.6 % Final    Comment:    (NOTE) Pre diabetes:          5.7%-6.4%  Diabetes:              >6.4%  Glycemic control for   <7.0% adults with diabetes   11/25/2018  06:44 AM 8.3 (H) 4.8 - 5.6 % Final    Comment:    (NOTE) Pre diabetes:          5.7%-6.4% Diabetes:              >6.4% Glycemic control for   <7.0% adults with diabetes     CBG: Recent Labs  Lab 10/18/19 1513 10/18/19 1916 10/18/19 2313 10/19/19 0315 10/19/19 0716  GLUCAP 116* 124* 141* 173* 197*       Critical care time: 40 minutes    Roselie Awkward, MD [place] PCCM Pager: [phone] Cell: [phone] If no response, call [phone]

## 2019-10-19 NOTE — Progress Notes (Signed)
Patient is postop day two status post below-knee amputation with subsequent cardiac arrest.  Patient is intubated VAC is working with one green check. 0 cc in the canister.  We will continue to follow patient as needed. Plan for [facility] to be in place for 1 week if it continues to work

## 2019-10-19 NOTE — Progress Notes (Signed)
173mL Fentanyl wasted with RN Rosezella Rumpf

## 2019-10-19 NOTE — Progress Notes (Signed)
eLink Physician-Brief Progress Note Patient Name: Felicia Reynolds DOB: Jul 22, 1980 MRN: [id number]   Date of Service  10/19/2019  HPI/Events of Note  Patient has had 3 large liwuid stools and has Stage II ulcer on sacrum/buttock. Request for Flexiseal.   eICU Interventions  Plan: 1. Place Flexiseal.      Intervention Category Major Interventions: Other:  Lysle Dingwall 10/19/2019, 11:33 PM

## 2019-10-19 NOTE — Progress Notes (Signed)
PT Cancellation Note  Patient Details Name: Felicia Reynolds MRN: [id number] DOB: 12-07-1980   Cancelled Treatment:    Reason Eval/Treat Not Completed: Other (comment) (pt is now a DNR, no reaction to painful stimuli.  Family having a [place] meeting soon.)  Will signoff at this time. 10/19/2019  Ginger Carne., PT Acute Rehabilitation Services [phone]  (pager) [phone]  (office)   Tessie Fass Mcgregor T[place]on 10/19/2019, 11:40 AM

## 2019-10-19 NOTE — Progress Notes (Signed)
OT Cancellation Note  Patient Details Name: Felicia Reynolds MRN: [id number] DOB: 1980-04-03   Cancelled Treatment:    Reason Eval/Treat Not Completed: Other (comment)--Pt is now a DNR, no reaction to painful stimuli.  Family having a [place] meeting soon. OT will sign off at this time.   Jolaine Artist, OT Acute Rehabilitation Services Pager [phone] Office [phone]   [name] 10/19/2019, 12:02 PM

## 2019-10-19 NOTE — TOC Progression Note (Signed)
Transition of Care [facility]) - Progression Note    Patient Details  Name: Felicia Reynolds MRN: [id number] Date of Birth: 1980/05/16  Transition of Care [facility]) CM/SW [place], RN Phone Number: 10/19/2019, 11:03 AM  Clinical Narrative:    Have sister and daughter now listed as NOK contacts family meeting for this weekend. EEG showing evidence of encephalopathy anoxic injury, clinically patient showing no following commands or awaking to stimuli off sedation.      Barriers to Discharge: Continued Medical Work up  Expected Discharge Plan and Services   In-house Referral: Clinical Social Work Discharge Planning Services: CM Consult   Living arrangements for the past 2 months: Mobile Home                                       Social Determinants of Health (SDOH) Interventions    Readmission Risk Interventions No flowsheet data found.

## 2019-10-19 NOTE — Progress Notes (Signed)
Admit: 10/29/2019 LOS: 6  2F with evidence of CKD 3 until presentation in September for diabetic foot ulcer requiring toe amputation and progressive AKI eventually requiring dialysis; she left 9/20 AMA and returns with a creatinine very similar to at the time of discharge.  Since admission her creatinine has worsened to 4.7.   Subjective:  . EEG worrisome for significant diffuse anoxic injury  . Off sedation . Aunt at bedside, updated . 0.6 L urine output . Creatinine up to 4.5 from 4.0, K4.2, HCO3 22  10/14 0701 - 10/15 0700 In: 1394.4 [I.V.:719.5; NG/GT:475; IV Piggyback:199.9] Out: 595 [Urine:595]  Filed Weights   10/09/2019 2224 10/18/2019 0932 10/19/19 0415  Weight: 104.3 kg 104.3 kg 124.6 kg    Scheduled Meds: . sodium chloride   Intravenous Once  . sodium chloride   Intravenous Once  . chlorhexidine gluconate (MEDLINE KIT)  15 mL Mouth Rinse BID  . Chlorhexidine Gluconate Cloth  6 each Topical Daily  . feeding supplement (PROSource TF)  45 mL Per Tube QID  . heparin  5,000 Units Subcutaneous Q8H  . insulin aspart  0-9 Units Subcutaneous Q4H  . linaclotide  72 mcg Per Tube Daily  . mouth rinse  15 mL Mouth Rinse 10 times per day  . metoprolol tartrate  12.5 mg Oral BID  . pantoprazole (PROTONIX) IV  40 mg Intravenous Q24H  . sodium chloride flush  10-40 mL Intracatheter Q12H   Continuous Infusions: . sodium chloride 10 mL/hr at 10/19/19 0900  . sodium chloride Stopped (10/18/19 1947)  . ampicillin-sulbactam (UNASYN) IV Stopped (10/18/19 2349)  . feeding supplement (VITAL 1.5 CAL) 1,000 mL (10/18/19 1624)  . fentaNYL infusion INTRAVENOUS Stopped (10/18/19 1059)   PRN Meds:.albuterol, hydrALAZINE, labetalol, polyethylene glycol, sodium chloride flush, sorbitol  Current Labs: reviewed    Physical Exam:  Blood pressure (!) 147/69, pulse 91, temperature 99.1 F (37.3 C), temperature source Axillary, resp. rate (!) 25, height 5' 6"  (1.676 m), weight 124.6 kg, SpO2 100  %. GEN: NAD, chronically ill-appearing, sedated  ENT: NCAT, +ETT EYES: EOMI CV: Regular, normal S1 and S2 PULM: coarse b/l ABD: Soft, nontender SKIN: Left foot bandaged, not examined EXT: No significant edema NEURO: Nonfocal  A 1. Prolonged AKI, no immediate indication to resume dialysis; see how she does in post arrest phase, so far UOP ok and GFR low but stable; given likely severity of her neurological injury I do not think that she would be a candidate for short or long-term dialysis.  If neurological exam changes, can reconsider.  She is not uremic based upon labs.  I do not think uremia will be clouding her neurological status. 2. Cardiac arrest 10/09/2019 3. 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 Acidosis, sig resp during #2, now mild metabolic, stable 5. E. coli bacteremia, per primary related to #3, unasyn 6. Diabetic foot ulcer, dehisced left toe amputation site; s/p L BKA 10/15/2019 7. DM2, uncontrolled 8. Chronic pain 9. Hypertension, BP stable 10. Anemia, chronic, multifactorial; asymptomatic  P 1. As above, continue supportive care 2. No indication for dialysis, nor do I think that she is a candidate for short or long-term therapy unless neurological status improves 3. Agree with goals of care conversation, palliative involvement 4. Daily weights, Daily Renal Panel, Strict I/Os, Avoid nephrotoxins (NSAIDs, judicious IV Contrast)     Pearson Grippe MD 10/19/2019, 11:11 AM  Recent Labs  Lab 10/18/19 0105 10/18/19 0325 10/18/19 1630 10/19/19 0414  NA 139 139  --  139  K 4.2 4.1  --  4.2  CL 107 105  --  106  CO2 18* 19*  --  22  GLUCOSE 119* 112*  --  171*  BUN 62* 63*  --  68*  CREATININE 3.97* 4.03*  --  4.52*  CALCIUM 7.4* 7.6*  --  7.3*  PHOS  --   --  6.0* 6.6*   Recent Labs  Lab 10/09/2019 2247 10/13/19 1558 10/18/19 0105 10/18/19 0325 10/19/19 0414  WBC 4.1   < > 20.1* 18.3* 14.9*  NEUTROABS 3.6  --   --   --   --   HGB 7.8*   < >  7.8* 8.0* 7.6*  HCT 26.0*   < > 24.1* 24.2* 23.1*  MCV 80.5   < > 79.5* 80.1 79.1*  PLT 210   < > 137* 138* 166   < > = values in this interval not displayed.

## 2019-10-20 DIAGNOSIS — L089 Local infection of the skin and subcutaneous tissue, unspecified: Secondary | ICD-10-CM | POA: Diagnosis not present

## 2019-10-20 DIAGNOSIS — N179 Acute kidney failure, unspecified: Secondary | ICD-10-CM | POA: Diagnosis not present

## 2019-10-20 DIAGNOSIS — E11628 Type 2 diabetes mellitus with other skin complications: Secondary | ICD-10-CM | POA: Diagnosis not present

## 2019-10-20 DIAGNOSIS — I469 Cardiac arrest, cause unspecified: Secondary | ICD-10-CM | POA: Diagnosis not present

## 2019-10-20 LAB — COMPREHENSIVE METABOLIC PANEL
ALT: 97 U/L — ABNORMAL HIGH (ref 0–44)
AST: 115 U/L — ABNORMAL HIGH (ref 15–41)
Albumin: 1.4 g/dL — ABNORMAL LOW (ref 3.5–5.0)
Alkaline Phosphatase: 352 U/L — ABNORMAL HIGH (ref 38–126)
Anion gap: 14 (ref 5–15)
BUN: 74 mg/dL — ABNORMAL HIGH (ref 6–20)
CO2: 19 mmol/L — ABNORMAL LOW (ref 22–32)
Calcium: 7.2 mg/dL — ABNORMAL LOW (ref 8.9–10.3)
Chloride: 108 mmol/L (ref 98–111)
Creatinine, Ser: 4.76 mg/dL — ABNORMAL HIGH (ref 0.44–1.00)
GFR, Estimated: 11 mL/min — ABNORMAL LOW (ref >60)
Glucose, Bld: 309 mg/dL — ABNORMAL HIGH (ref 70–99)
Potassium: 4.4 mmol/L (ref 3.5–5.1)
Sodium: 141 mmol/L (ref 135–145)
Total Bilirubin: 1.1 mg/dL (ref 0.3–1.2)
Total Protein: 5.5 g/dL — ABNORMAL LOW (ref 6.5–8.1)

## 2019-10-20 LAB — GLUCOSE, CAPILLARY
Glucose-Capillary: 268 mg/dL — ABNORMAL HIGH (ref 70–99)
Glucose-Capillary: 272 mg/dL — ABNORMAL HIGH (ref 70–99)
Glucose-Capillary: 296 mg/dL — ABNORMAL HIGH (ref 70–99)
Glucose-Capillary: 296 mg/dL — ABNORMAL HIGH (ref 70–99)
Glucose-Capillary: 297 mg/dL — ABNORMAL HIGH (ref 70–99)
Glucose-Capillary: 301 mg/dL — ABNORMAL HIGH (ref 70–99)

## 2019-10-20 LAB — CBC
HCT: 23.8 % — ABNORMAL LOW (ref 36.0–46.0)
Hemoglobin: 7.6 g/dL — ABNORMAL LOW (ref 12.0–15.0)
MCH: 25.5 pg — ABNORMAL LOW (ref 26.0–34.0)
MCHC: 31.9 g/dL (ref 30.0–36.0)
MCV: 79.9 fL — ABNORMAL LOW (ref 80.0–100.0)
Platelets: 164 10*3/uL (ref 150–400)
RBC: 2.98 MIL/uL — ABNORMAL LOW (ref 3.87–5.11)
RDW: 22.9 % — ABNORMAL HIGH (ref 11.5–15.5)
WBC: 14.4 10*3/uL — ABNORMAL HIGH (ref 4.0–10.5)
nRBC: 0 % (ref 0.0–0.2)

## 2019-10-20 MED ORDER — INSULIN ASPART 100 UNIT/ML ~~LOC~~ SOLN
5.0000 [IU] | SUBCUTANEOUS | Status: DC
  Administered 2019-10-20 – 2019-10-21 (×6): 5 [IU] via SUBCUTANEOUS

## 2019-10-20 MED ORDER — METOPROLOL TARTRATE 12.5 MG HALF TABLET
50.0000 mg | ORAL_TABLET | Freq: Two times a day (BID) | ORAL | Status: DC
  Administered 2019-10-20 – 2019-10-21 (×2): 50 mg
  Filled 2019-10-20 (×2): qty 4

## 2019-10-20 NOTE — Progress Notes (Signed)
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 Intubated and transferred to ICU, PCCM consulted.  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 Sharol Given Nephrology  Procedures:  10/13 left BKA 10/13 ETT >>  Significant Diagnostic Tests:  10/9 CT left foot/ tib/ fib >> 1. Interval medial forefoot amputation with resection of the 1st metatarsal and great toe and amputation through the mid 2nd metatarsal. 2. Extensive inflammatory changes in the forefoot and midfoot with multifocal ill-defined fluid collections and air bubbles suspicious for recurrent soft tissue infection. 3. New multifocal osseous fragmentation/fractures involving the 3rd and 4th proximal phalangeal bases, 3rd metatarsal base and distal cuboid. There is lateral dislocation at the 3rd MTP joint. These findings could be neuropathic, but given the close approximation to the soft tissue findings and the rapid development, osteomyelitis cannot be excluded. 4. No acute osseous findings or bone destruction identified within the hindfoot or lower leg.  10/14 CT head > non contrast CT appearance of brain appears stable since last year, no evidence of anoxic brain injury  10/14 EEG > periodic epileptiform discharges with triphasic morphology consistent with profound diffuse encephalopathy, can be seen with anoxic brain injury.    Micro Data:   10/9 SARS 2/ flu >> neg 10/8 BCx 2 >> E. Coli 10/13 MRSA PCR >>  Antimicrobials:  10/8 vanc  10/8 cefepime >> 10/10 10/8 clindamycin  10/9 flagyl >> > 10/10 10/11 Unasyn >> 10/13 cefazolin preop  Interim history/subjective:   No acute events  Objective   Blood pressure 138/67, pulse 77, temperature 98.2 F (36.8 C), temperature source Axillary, resp. rate (!) 25, height 5\' 6"  (1.676 m), weight 124.5 kg, SpO2 100 %.    Vent Mode: PRVC FiO2 (%):  [30 %] 30 % Set Rate:  [25 bmp] 25 bmp Vt Set:  [470 mL] 470 mL PEEP:  [5 cmH20] 5 cmH20 Plateau Pressure:  [19 cmH20-26 cmH20] 19 cmH20   Intake/Output Summary (Last 24 hours) at 10/20/2019 0739 Last data filed at 10/20/2019 0600 Gross per 24 hour  Intake 1650.69 ml  Output 995 ml  Net 655.69 ml   Filed Weights   10/19/2019 0932 10/19/19 0415 10/20/19 0412  Weight: 104.3 kg 124.6 kg 124.5 kg    Examination:  General:  In bed on vent HENT: NCAT ETT in place PULM: CTA B, vent supported breathing CV: RRR, no mgr GI: BS+, soft, nontender MSK: normal bulk and tone Neuro: sedated on vent   Resolved Hospital Problem list     Assessment & Plan:  PEA cardiac arrest due to acidemia 2/2 AKI on CKD, septic shock from e.coli bacteremia, complicated by narcotic use Acute metabolic encephalopathy, picture worrisome for anoxic brain injury, EEG findings suggestive of that as is clinical picture Hold sedation Daily neuro exams Family coming on Sunday to see her and likely withdraw care  Acute hypoxemic respiratory failure due to inability to protect airway Full mechanical  vent support VAP prevention Daily WUA/SBT  Acute metabolic acidosis Worsening AKI, not a dialysis candidate Monitor BMET and UOP Replace electrolytes as needed  Hyperglycemia/DM2 Increase insulin: tube feeding coverage today SSI  Shock liver> improving LFT prn  E. Coli bacteremia from diabetic foot infection S/p L BKA 10/13 Plan ten days of  unasyn  Hypertension > worse Increase metoprolol Hold ACE Consider clonidine   Best practice:  Diet: tube feeding Pain/Anxiety/Delirium protocol (if indicated): RASS goal 0 VAP protocol (if indicated): yes DVT prophylaxis: sub cutaneous heparin GI prophylaxis: pantoprazole Glucose control: SSI Mobility: bed rest Code Status: DNR Family Communication:see note from 10/15, family coming for family meeting on 10/17; given very poor prognosis will not escalate care Disposition:   Labs   CBC: Recent Labs  Lab 10/07/2019 0200 10/31/2019 0200 11/02/2019 2048 10/18/19 0105 10/18/19 0325 10/19/19 0414 10/20/19 0321  WBC 21.9*  --   --  20.1* 18.3* 14.9* 14.4*  HGB 8.1*   < > 8.5* 7.8* 8.0* 7.6* 7.6*  HCT 25.4*   < > 25.0* 24.1* 24.2* 23.1* 23.8*  MCV 81.4  --   --  79.5* 80.1 79.1* 79.9*  PLT 170  --   --  137* 138* 166 164   < > = values in this interval not displayed.    Basic Metabolic Panel: Recent Labs  Lab 10/25/2019 0200 10/25/2019 0200 11/03/2019 2048 10/18/19 0105 10/18/19 0325 10/18/19 1630 10/19/19 0414 10/19/19 1700 10/20/19 0321  NA 135   < > 139 139 139  --  139  --  141  K 4.3   < > 4.6 4.2 4.1  --  4.2  --  4.4  CL 106  --   --  107 105  --  106  --  108  CO2 20*  --   --  18* 19*  --  22  --  19*  GLUCOSE 90  --   --  119* 112*  --  171*  --  309*  BUN 61*  --   --  62* 63*  --  68*  --  74*  CREATININE 3.91*  --   --  3.97* 4.03*  --  4.52*  --  4.76*  CALCIUM 8.2*  --   --  7.4* 7.6*  --  7.3*  --  7.2*  MG  --   --   --   --   --  1.7 1.8 1.8  --   PHOS  --   --   --   --   --  6.0* 6.6* 6.8*  --    < > = values in this interval not displayed.   GFR: Estimated Creatinine Clearance: 21 mL/min (A) (by C-G formula based on SCr of 4.76 mg/dL (H)). Recent Labs  Lab 10/18/19 0105 10/18/19 0325 10/19/19 0414 10/20/19 0321  WBC 20.1* 18.3* 14.9* 14.4*  LATICACIDVEN 1.2  --   --   --     Liver Function Tests: Recent Labs  Lab 10/06/2019 0200  10/18/19 0105 10/18/19 0325 10/19/19 0414 10/20/19 0321  AST 14* 1,728* 1,739* 429* 115*  ALT 19 394* 424* 191* 97*  ALKPHOS 322* 317* 337* 258* 352*  BILITOT 0.6 1.7* 2.0* 1.0 1.1  PROT 6.1* 5.5* 5.6* 5.3* 5.5*  ALBUMIN 1.5* 1.4* 1.5* 1.4* 1.4*   No results for input(s): LIPASE, AMYLASE in the last 168 hours. No results for input(s): AMMONIA in the last 168 hours.  ABG    Component Value  Date/Time   PHART 7.402 10/09/2019 2048   PCO2ART 30.0 (L) 10/25/2019 2048   PO2ART 151 (H) 10/16/2019 2048   HCO3 18.7 (L) 10/28/2019 2048   TCO2 20 (L) 11/01/2019 2048   ACIDBASEDEF 5.0 (H) 10/20/2019 2048   O2SAT 99.0 11/01/2019 2048     Coagulation Profile: Recent Labs  Lab 10/18/19 0105 10/19/19 0414  INR 2.0* 1.6*    Cardiac Enzymes: Recent Labs  Lab 10/18/19 0521  CKTOTAL 119    HbA1C: Hemoglobin A1C  Date/Time Value Ref Range Status  03/12/2016 12:00 AM 14  Final   Hgb A1c MFr Bld  Date/Time Value Ref Range Status  09/13/2019 06:44 PM 9.0 (H) 4.8 - 5.6 % Final    Comment:    (NOTE) Pre diabetes:          5.7%-6.4%  Diabetes:              >6.4%  Glycemic control for   <7.0% adults with diabetes   11/25/2018 06:44 AM 8.3 (H) 4.8 - 5.6 % Final    Comment:    (NOTE) Pre diabetes:          5.7%-6.4% Diabetes:              >6.4% Glycemic control for   <7.0% adults with diabetes     CBG: Recent Labs  Lab 10/19/19 1520 10/19/19 1939 10/19/19 2322 10/20/19 0313 10/20/19 0712  GLUCAP 243* 266* 238* 268* 296*       Critical care time: 35 minutes    Roselie Awkward, MD [place] Pager: [phone] Cell: [phone] If no response, call [phone]

## 2019-10-20 NOTE — Progress Notes (Signed)
Admit: 10/11/2019 LOS: 7  31F with evidence of CKD 3 until presentation in September for diabetic foot ulcer requiring toe amputation and progressive AKI eventually requiring dialysis; she left 9/20 AMA and returns with a creatinine very similar to at the time of discharge.  Since admission her creatinine has worsened to 4.7.   Subjective:   No acute events  0.8 L of UOP and 2 unmeasured voids overnight  SCR up to 4.76, K4.4  Neurological exam remains very worrisome   10/15 0701 - 10/16 0700 In: 1660.7 [I.V.:229.9; NG/GT:1150; IV Piggyback:280.8] Out: 995 [Urine:895; Stool:100]  Filed Weights   10/10/2019 0932 10/19/19 0415 10/20/19 0412  Weight: 104.3 kg 124.6 kg 124.5 kg    Scheduled Meds:  sodium chloride   Intravenous Once   sodium chloride   Intravenous Once   chlorhexidine gluconate (MEDLINE KIT)  15 mL Mouth Rinse BID   Chlorhexidine Gluconate Cloth  6 each Topical Daily   feeding supplement (PROSource TF)  45 mL Per Tube QID   heparin  5,000 Units Subcutaneous Q8H   insulin aspart  0-9 Units Subcutaneous Q4H   linaclotide  72 mcg Per Tube Daily   mouth rinse  15 mL Mouth Rinse 10 times per day   metoprolol tartrate  12.5 mg Per Tube BID   pantoprazole (PROTONIX) IV  40 mg Intravenous Q24H   sodium chloride flush  10-40 mL Intracatheter Q12H   Continuous Infusions:  sodium chloride 10 mL/hr at 10/20/19 0800   sodium chloride Stopped (10/18/19 1947)   ampicillin-sulbactam (UNASYN) IV 3 g (10/19/19 2215)   feeding supplement (VITAL 1.5 CAL) 50 mL/hr at 10/19/19 2000   fentaNYL infusion INTRAVENOUS Stopped (10/18/19 1059)   PRN Meds:.albuterol, hydrALAZINE, labetalol, polyethylene glycol, sodium chloride flush, sorbitol  Current Labs: reviewed    Physical Exam:  Blood pressure (!) 146/67, pulse 81, temperature 98.2 F (36.8 C), temperature source Axillary, resp. rate (!) 25, height _0  (1.676 m), weight 124.5 kg, SpO2 100 %. GEN: NAD,  chronically ill-appearing, sedated  ENT: NCAT, +ETT EYES: EOMI CV: Regular, normal S1 and S2 PULM: coarse b/l ABD: Soft, nontender SKIN: Left foot bandaged, not examined EXT: No significant edema NEURO: Nonfocal  A 1. Prolonged AKI, with recent worsening after cardiac arrest.  No hard indication for dialysis.  Further, likely with severe anoxic injury, therefore I do not think that she is a candidate for dialysis either short or long-term.  U OP is reassuring, will continue to trend labs. 2. Cardiac arrest 10/25/2019 3. 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 Acidosis, sig resp during #2, now mild metabolic, stable 5. E. coli bacteremia, per primary related to #3, unasyn 6. Diabetic foot ulcer, dehisced left toe amputation site; s/p L BKA 10/05/2019 7. DM2, uncontrolled 8. Chronic pain 9. Hypertension, BP stable 10. Anemia, chronic, multifactorial; asymptomatic  P 1. As above, continue supportive care 2. No indication for dialysis, nor do I think that she is a candidate for short or long-term therapy unless neurological status improves 3. Agree with goals of care conversation, palliative involvement 4. Daily weights, Daily Renal Panel, Strict I/Os, Avoid nephrotoxins (NSAIDs, judicious IV Contrast)     Pearson Grippe MD 10/20/2019, 9:15 AM  Recent Labs  Lab 10/18/19 0325 10/18/19 1630 10/19/19 0414 10/19/19 1700 10/20/19 0321  NA 139  --  139  --  141  K 4.1  --  4.2  --  4.4  CL 105  --  106  --  108  CO2 19*  --  22  --  19*  GLUCOSE 112*  --  171*  --  309*  BUN 63*  --  68*  --  74*  CREATININE 4.03*  --  4.52*  --  4.76*  CALCIUM 7.6*  --  7.3*  --  7.2*  PHOS  --  6.0* 6.6* 6.8*  --    Recent Labs  Lab 10/18/19 0325 10/19/19 0414 10/20/19 0321  WBC 18.3* 14.9* 14.4*  HGB 8.0* 7.6* 7.6*  HCT 24.2* 23.1* 23.8*  MCV 80.1 79.1* 79.9*  PLT 138* 166 164

## 2019-10-21 DIAGNOSIS — I469 Cardiac arrest, cause unspecified: Secondary | ICD-10-CM | POA: Diagnosis not present

## 2019-10-21 DIAGNOSIS — N179 Acute kidney failure, unspecified: Secondary | ICD-10-CM | POA: Diagnosis not present

## 2019-10-21 DIAGNOSIS — E11628 Type 2 diabetes mellitus with other skin complications: Secondary | ICD-10-CM | POA: Diagnosis not present

## 2019-10-21 DIAGNOSIS — L02612 Cutaneous abscess of left foot: Secondary | ICD-10-CM | POA: Diagnosis not present

## 2019-10-21 LAB — COMPREHENSIVE METABOLIC PANEL
ALT: 51 U/L — ABNORMAL HIGH (ref 0–44)
AST: 39 U/L (ref 15–41)
Albumin: 1.5 g/dL — ABNORMAL LOW (ref 3.5–5.0)
Alkaline Phosphatase: 317 U/L — ABNORMAL HIGH (ref 38–126)
Anion gap: 11 (ref 5–15)
BUN: 85 mg/dL — ABNORMAL HIGH (ref 6–20)
CO2: 22 mmol/L (ref 22–32)
Calcium: 7.4 mg/dL — ABNORMAL LOW (ref 8.9–10.3)
Chloride: 110 mmol/L (ref 98–111)
Creatinine, Ser: 4.63 mg/dL — ABNORMAL HIGH (ref 0.44–1.00)
GFR, Estimated: 11 mL/min — ABNORMAL LOW (ref >60)
Glucose, Bld: 298 mg/dL — ABNORMAL HIGH (ref 70–99)
Potassium: 4.8 mmol/L (ref 3.5–5.1)
Sodium: 143 mmol/L (ref 135–145)
Total Bilirubin: 0.8 mg/dL (ref 0.3–1.2)
Total Protein: 5.6 g/dL — ABNORMAL LOW (ref 6.5–8.1)

## 2019-10-21 LAB — GLUCOSE, CAPILLARY
Glucose-Capillary: 281 mg/dL — ABNORMAL HIGH (ref 70–99)
Glucose-Capillary: 276 mg/dL — ABNORMAL HIGH (ref 70–99)
Glucose-Capillary: 243 mg/dL — ABNORMAL HIGH (ref 70–99)

## 2019-10-21 LAB — CBC
HCT: 24.6 % — ABNORMAL LOW (ref 36.0–46.0)
Hemoglobin: 7.6 g/dL — ABNORMAL LOW (ref 12.0–15.0)
MCH: 25.3 pg — ABNORMAL LOW (ref 26.0–34.0)
MCHC: 30.9 g/dL (ref 30.0–36.0)
MCV: 82 fL (ref 80.0–100.0)
Platelets: 157 10*3/uL (ref 150–400)
RBC: 3 MIL/uL — ABNORMAL LOW (ref 3.87–5.11)
RDW: 23.9 % — ABNORMAL HIGH (ref 11.5–15.5)
WBC: 14.9 10*3/uL — ABNORMAL HIGH (ref 4.0–10.5)
nRBC: 0 % (ref 0.0–0.2)

## 2019-10-21 MED ORDER — GLYCOPYRROLATE 1 MG PO TABS
1.0000 mg | ORAL_TABLET | ORAL | Status: DC | PRN
  Filled 2019-10-21: qty 1

## 2019-10-21 MED ORDER — POLYVINYL ALCOHOL 1.4 % OP SOLN
1.0000 [drp] | Freq: Four times a day (QID) | OPHTHALMIC | Status: DC | PRN
  Filled 2019-10-21: qty 15

## 2019-10-21 MED ORDER — ACETAMINOPHEN 650 MG RE SUPP: 650.0000 mg | Freq: Four times a day (QID) | RECTAL | Status: DC | PRN

## 2019-10-21 MED ORDER — GLYCOPYRROLATE 0.2 MG/ML IJ SOLN: 0.2000 mg | INTRAMUSCULAR | Status: DC | PRN

## 2019-10-21 MED ORDER — MORPHINE 100MG IN NS 100ML (1MG/ML) PREMIX INFUSION
1.0000 mg/h | INTRAVENOUS | Status: DC
  Administered 2019-10-21: 2 mg/h via INTRAVENOUS
  Filled 2019-10-21: qty 100

## 2019-10-21 MED ORDER — DIPHENHYDRAMINE HCL 50 MG/ML IJ SOLN: 25.0000 mg | INTRAMUSCULAR | Status: DC | PRN

## 2019-10-21 MED ORDER — ACETAMINOPHEN 325 MG PO TABS: 650.0000 mg | ORAL_TABLET | Freq: Four times a day (QID) | ORAL | Status: DC | PRN

## 2019-10-21 MED ORDER — GLYCOPYRROLATE 0.2 MG/ML IJ SOLN
0.2000 mg | INTRAMUSCULAR | Status: DC | PRN
  Administered 2019-10-22: 0.2 mg via INTRAVENOUS
  Filled 2019-10-21: qty 1

## 2019-10-21 MED ORDER — DEXTROSE 5 % IV SOLN: INTRAVENOUS | Status: DC

## 2019-10-21 NOTE — Progress Notes (Signed)
[facility]  I had a lengthy conversation with the patient's family including her sisters, her daughter [name], an aunt and a niece.  They expressed their frustration that Felicia Reynolds had a cardiac arrest after surgery and felt that this event was preventable.  I provided emotional support and explained that we understand the pain they are feeling; then I explained that regardless the circumstances surrounding her death she has severe brain injury, very little chance of meaningful neurologic recovery, worsening acute kidney injury.  The only conceivable way that we could consider waiting to see what her neurologic recovery might be would be by placing a tracheostomy, PEG tube and considering dialysis.  None of these would be curative and it is likely she would require these for months until her inevitable death from complications of severe brain injury.  They stated that she would not want to live that way under any circumstances and agree to my suggested plan of withdrawal of mechanical ventilatory support and full comfort measures.    Roselie Awkward, MD [place] [facility] Pager: [phone] Cell: [phone] If no response, call [phone]

## 2019-10-21 NOTE — Progress Notes (Signed)
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 Intubated and transferred to ICU, PCCM consulted.  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 Sharol Given Nephrology  Procedures:  10/13 left BKA 10/13 ETT >>  Significant Diagnostic Tests:  10/9 CT left foot/ tib/ fib >> 1. Interval medial forefoot amputation with resection of the 1st metatarsal and great toe and amputation through the mid 2nd metatarsal. 2. Extensive inflammatory changes in the forefoot and midfoot with multifocal ill-defined fluid collections and air bubbles suspicious for recurrent soft tissue infection. 3. New multifocal osseous fragmentation/fractures involving the 3rd and 4th proximal phalangeal bases, 3rd metatarsal base and distal cuboid. There is lateral dislocation at the 3rd MTP joint. These findings could be neuropathic, but given the close approximation to the soft tissue findings and the rapid development, osteomyelitis cannot be excluded. 4. No acute osseous findings or bone destruction identified within the hindfoot or lower leg.  10/14 CT head > non contrast CT appearance of brain appears stable since last year, no evidence of anoxic brain injury  10/14 EEG > periodic epileptiform discharges with triphasic morphology consistent with profound diffuse encephalopathy, can be seen with anoxic brain injury.    Micro Data:   10/9 SARS 2/ flu >> neg 10/8 BCx 2 >> E. Coli 10/13 MRSA PCR >>  Antimicrobials:  10/8 vanc  10/8 cefepime >> 10/10 10/8 clindamycin  10/9 flagyl >> > 10/10 10/11 Unasyn >> 10/13 cefazolin preop  Interim history/subjective:   No acute events  Objective   Blood pressure (!) 158/61, pulse 84, temperature 99.2 F (37.3 C), temperature source Axillary, resp. rate (!) 25, height 5\' 6"  (1.676 m), weight 124.4 kg, SpO2 99 %.    Vent Mode: PRVC FiO2 (%):  [30 %] 30 % Set Rate:  [25 bmp] 25 bmp Vt Set:  [470 mL] 470 mL PEEP:  [5 cmH20] 5 cmH20 Plateau Pressure:  [17 cmH20-20 cmH20] 17 cmH20   Intake/Output Summary (Last 24 hours) at 10/21/2019 0841 Last data filed at 10/21/2019 0800 Gross per 24 hour  Intake 1829.59 ml  Output 1435 ml  Net 394.59 ml   Filed Weights   10/19/19 0415 10/20/19 0412 10/21/19 0357  Weight: 124.6 kg 124.5 kg 124.4 kg    Examination:  General:  In bed on vent HENT: NCAT ETT in place PULM: Rhonchi B, vent supported breathing CV: RRR, no mgr GI: BS+, soft, nontender MSK: normal bulk and tone, left BKA Neuro: upward gaze to painful stimuli, cough with suctioning, no movement or localization arms/leg   Resolved Hospital Problem list     Assessment & Plan:  PEA cardiac arrest due to acidemia 2/2 AKI on CKD, septic shock from e.coli bacteremia, complicated by narcotic use Acute metabolic encephalopathy, picture worrisome for anoxic brain injury, EEG findings suggestive of that as is clinical picture> exam on 10/17 unchanged compared to 10/15, prognosis dismal Acute hypoxemic respiratory failure due to inability  to protect airway Acute metabolic acidosis Hyperglycemia/DM2 Shock liver> improving E. Coli bacteremia from diabetic foot infection S/p L BKA 10/13 Hypertension > worse  Discussion: Her exam has not changed for me in the last 48 hours.  Her prognosis is dismal.   Plan: Goals of care conversation today with family Continue full  ventilator support for now VAP prevention Hold further lab draws Continue metoprolol and clonidine for now  Likely withdraw care today  Best practice:  Diet: tube feeding Pain/Anxiety/Delirium protocol (if indicated): RASS goal 0 VAP protocol (if indicated): yes DVT prophylaxis: sub cutaneous heparin GI prophylaxis: pantoprazole Glucose control: SSI Mobility: bed rest Code Status: DNR Family Communication: updated her daughter by phone this morning, will update them in person today  Disposition:   Labs   CBC: Recent Labs  Lab 10/18/19 0105 10/18/19 0325 10/19/19 0414 10/20/19 0321 10/21/19 0353  WBC 20.1* 18.3* 14.9* 14.4* 14.9*  HGB 7.8* 8.0* 7.6* 7.6* 7.6*  HCT 24.1* 24.2* 23.1* 23.8* 24.6*  MCV 79.5* 80.1 79.1* 79.9* 82.0  PLT 137* 138* 166 164 034    Basic Metabolic Panel: Recent Labs  Lab 10/18/19 0105 10/18/19 0325 10/18/19 1630 10/19/19 0414 10/19/19 1700 10/20/19 0321 10/21/19 0353  NA 139 139  --  139  --  141 143  K 4.2 4.1  --  4.2  --  4.4 4.8  CL 107 105  --  106  --  108 110  CO2 18* 19*  --  22  --  19* 22  GLUCOSE 119* 112*  --  171*  --  309* 298*  BUN 62* 63*  --  68*  --  74* 85*  CREATININE 3.97* 4.03*  --  4.52*  --  4.76* 4.63*  CALCIUM 7.4* 7.6*  --  7.3*  --  7.2* 7.4*  MG  --   --  1.7 1.8 1.8  --   --   PHOS  --   --  6.0* 6.6* 6.8*  --   --    GFR: Estimated Creatinine Clearance: 21.5 mL/min (A) (by C-G formula based on SCr of 4.63 mg/dL (H)). Recent Labs  Lab 10/18/19 0105 10/18/19 0105 10/18/19 0325 10/19/19 0414 10/20/19 0321 10/21/19 0353  WBC 20.1*   < > 18.3* 14.9* 14.4* 14.9*  LATICACIDVEN 1.2  --   --   --   --   --    < > = values in this interval not displayed.    Liver Function Tests: Recent Labs  Lab 10/18/19 0105 10/18/19 0325 10/19/19 0414 10/20/19 0321 10/21/19 0353  AST 1,728* 1,739* 429* 115* 39  ALT 394* 424* 191* 97* 51*  ALKPHOS 317* 337* 258* 352* 317*  BILITOT 1.7* 2.0* 1.0 1.1 0.8   PROT 5.5* 5.6* 5.3* 5.5* 5.6*  ALBUMIN 1.4* 1.5* 1.4* 1.4* 1.5*   No results for input(s): LIPASE, AMYLASE in the last 168 hours. No results for input(s): AMMONIA in the last 168 hours.  ABG    Component Value Date/Time   PHART 7.402 11/01/2019 2048   PCO2ART 30.0 (L) 10/11/2019 2048   PO2ART 151 (H) 10/09/2019 2048   HCO3 18.7 (L) 10/20/2019 2048   TCO2 20 (L) 10/24/2019 2048   ACIDBASEDEF 5.0 (H) 10/14/2019 2048   O2SAT 99.0 11/03/2019 2048     Coagulation Profile: Recent Labs  Lab 10/18/19 0105 10/19/19 0414  INR 2.0* 1.6*    Cardiac Enzymes: Recent Labs  Lab 10/18/19 0521  CKTOTAL 119    HbA1C: Hemoglobin  A1C  Date/Time Value Ref Range Status  03/12/2016 12:00 AM 14  Final   Hgb A1c MFr Bld  Date/Time Value Ref Range Status  09/13/2019 06:44 PM 9.0 (H) 4.8 - 5.6 % Final    Comment:    (NOTE) Pre diabetes:          5.7%-6.4%  Diabetes:              >6.4%  Glycemic control for   <7.0% adults with diabetes   11/25/2018 06:44 AM 8.3 (H) 4.8 - 5.6 % Final    Comment:    (NOTE) Pre diabetes:          5.7%-6.4% Diabetes:              >6.4% Glycemic control for   <7.0% adults with diabetes     CBG: Recent Labs  Lab 10/20/19 1521 10/20/19 1937 10/20/19 2344 10/21/19 0345 10/21/19 0709  GLUCAP 296* 301* 272* 281* 276*       Critical care time: 32 minutes    Roselie Awkward, MD [place] [facility] Pager: [phone] Cell: [phone] If no response, call [phone]

## 2019-10-21 NOTE — Progress Notes (Signed)
Admit: 11/02/2019 LOS: 8  Felicia Reynolds with evidence of CKD 3 until presentation in September for diabetic foot ulcer requiring toe amputation and progressive AKI eventually requiring dialysis; she left 9/20 AMA and returns with a creatinine very similar to at the time of discharge.  Since admission her creatinine has worsened to 4.7.   Subjective:   No acute events  Neurological exam very poor  Creatinine stable/slightly improved, K4.8  UOP 1.2 L  Goals of care meeting with family today   10/16 0701 - 10/17 0700 In: 1879.6 [I.V.:239.6; NG/GT:1440; IV Piggyback:200] Out: 1310 [Urine:1210; Stool:100]  Filed Weights   10/19/19 0415 10/20/19 0412 10/21/19 0357  Weight: 124.6 kg 124.5 kg 124.4 kg    Scheduled Meds:  sodium chloride   Intravenous Once   sodium chloride   Intravenous Once   chlorhexidine gluconate (MEDLINE KIT)  15 mL Mouth Rinse BID   Chlorhexidine Gluconate Cloth  6 each Topical Daily   feeding supplement (PROSource TF)  45 mL Per Tube QID   heparin  5,000 Units Subcutaneous Q8H   insulin aspart  0-9 Units Subcutaneous Q4H   insulin aspart  5 Units Subcutaneous Q4H   linaclotide  72 mcg Per Tube Daily   mouth rinse  15 mL Mouth Rinse 10 times per day   metoprolol tartrate  50 mg Per Tube BID   pantoprazole (PROTONIX) IV  40 mg Intravenous Q24H   sodium chloride flush  10-40 mL Intracatheter Q12H   Continuous Infusions:  sodium chloride 10 mL/hr at 10/21/19 1000   sodium chloride Stopped (10/18/19 1947)   ampicillin-sulbactam (UNASYN) IV 3 g (10/21/19 1023)   feeding supplement (VITAL 1.5 CAL) 1,000 mL (10/20/19 1436)   fentaNYL infusion INTRAVENOUS Stopped (10/18/19 1059)   PRN Meds:.albuterol, hydrALAZINE, labetalol, polyethylene glycol, sodium chloride flush, sorbitol  Current Labs: reviewed    Physical Exam:  Blood pressure (!) 142/62, pulse 83, temperature 99 F (37.2 C), temperature source Axillary, resp. rate (!) 25, height _0   (1.676 m), weight 124.4 kg, SpO2 96 %. GEN: NAD, chronically ill-appearing, sedated  ENT: NCAT, +ETT EYES: EOMI CV: Regular, normal S1 and S2 PULM: coarse b/l ABD: Soft, nontender SKIN: Left foot bandaged, not examined EXT: No significant edema NEURO: Nonfocal  A 1. Prolonged AKI, with recent worsening after cardiac arrest.  No hard indication for dialysis.  Appears to have stable low GFR at this time.  Dialysis is not indicated nor would it be offered. 2. Cardiac arrest 10/05/2019  3. 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 Acidosis, sig resp during #2, now mild metabolic, stable 5. E. coli bacteremia, per primary related to #3, unasyn 6. Diabetic foot ulcer, dehisced left toe amputation site; s/p L BKA 10/29/2019 7. DM2, uncontrolled 8. Chronic pain 9. Hypertension, BP stable 10. Anemia, chronic, multifactorial; asymptomatic  P 1. As above, continue supportive care 2. No further recommendations 3. Anticipated transition to comfort measures today 4. We will sign off, please call back if goals of care change and renal failure worsening   Pearson Grippe MD 10/21/2019, 11:15 AM  Recent Labs  Lab 10/18/19 0325 10/18/19 1630 10/19/19 0414 10/19/19 1700 10/20/19 0321 10/21/19 0353  NA   < >  --  139  --  141 143  K   < >  --  4.2  --  4.4 4.8  CL   < >  --  106  --  108 110  CO2   < >  --  22  --  19* 22  GLUCOSE   < >  --  171*  --  309* 298*  BUN   < >  --  68*  --  74* 85*  CREATININE   < >  --  4.52*  --  4.76* 4.63*  CALCIUM   < >  --  7.3*  --  7.2* 7.4*  PHOS  --  6.0* 6.6* 6.8*  --   --    < > = values in this interval not displayed.   Recent Labs  Lab 10/19/19 0414 10/20/19 0321 10/21/19 0353  WBC 14.9* 14.4* 14.9*  HGB 7.6* 7.6* 7.6*  HCT 23.1* 23.8* 24.6*  MCV 79.1* 79.9* 82.0  PLT 166 164 157

## 2019-10-21 NOTE — Procedures (Signed)
Extubation Procedure Note  Patient Details:   Name: Felicia Reynolds DOB: [birth year] MRN: [id number]   Airway Documentation:    Vent end date: (not recorded) Vent end time: (not recorded)   Evaluation  O2 sats: currently acceptable Complications: No apparent complications Patient did tolerate procedure well. Bilateral Breath Sounds: Diminished, Rhonchi   No   Pt terminally extubated per MD order.  Pt is on RA and RN at bedside. RT will continue to monitor.  Valora Piccolo 10/21/2019, 4:07 PM

## 2019-10-21 NOTE — Progress Notes (Signed)
Assisted sister with tele-visit via elink

## 2019-11-05 NOTE — Progress Notes (Signed)
Nutrition Brief Note  Chart reviewed. Pt now transitioning to comfort care.  No further nutrition interventions warranted at this time.  Please re-consult as needed.   Topanga Alvelo W, RD, LDN, CDCES Registered Dietitian II Certified Diabetes Care and Education Specialist Please refer to [facility] for RD and/or RD on-call/weekend/after hours pager  

## 2019-11-05 NOTE — Death Summary Note (Signed)
DEATH SUMMARY   Patient Details  Name: Felicia Reynolds MRN: [id number] DOB: 07/10/1980  Admission/Discharge Information   Admit Date:  October 18, 2019  Date of Death: Date of Death: 10/28/19  Time of Death: Time of Death: 0944  Length of Stay: March 19, 2022  Referring Physician: [facility], [place]   Reason(s) for Hospitalization  Diabetic foot  Diagnoses  Preliminary cause of death:   E coli bacteremia Secondary Diagnoses (including complications and co-morbidities):  Principal Problem:   Diabetic foot infection ([place]) Active Problems:   Morbid obesity ([facility])   Personal history of noncompliance with medical treatment, presenting hazards to health   Essential hypertension, benign   Uncontrolled type 2 diabetes mellitus with peripheral neuropathy ([place])   Subacute osteomyelitis, left ankle and foot ([place])   Sepsis ([place])   Acute kidney injury ([place])   Bipolar disorder ([place])   Cutaneous abscess of left foot   Shock ([place])   Cardiac arrest ([place])   Pressure injury of skin   Brief Hospital Course (including significant findings, care, treatment, and services provided and events leading to death)  Felicia Reynolds is a 39 y.o. year old female who was admitted with severe sepsis from e coli bacteremia from a diabetic foot infection.  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  Post operatively after discharge from PACU she suffered a cardiac arrest and was trasnferred to the ICU.  In the ICU sedation was held but the patient's mental status did not improve.  She had a cough/gag reflex but no corneal response, no response to painful stimuli.  She had progressive renal failure.  The patient's family advised that she would not want prolonged management in the ICU or in a SNF with tracheostomy, PEG, etc.  Based on this they felt she would want withdrawal of mechanical ventilator support.  She died after compassionate extubation.   Pertinent Labs and Studies  Significant Diagnostic  Studies DG Chest 2 View  Result Date: 10-18-2019 CLINICAL DATA:  Fevers and foot skin infection, initial encounter EXAM: CHEST - 2 VIEW COMPARISON:  09/13/2019 FINDINGS: Cardiac shadow is within normal limits. The lungs are clear bilaterally. No acute bony abnormality is noted. IMPRESSION: No active cardiopulmonary disease. Electronically Signed   By: Inez Catalina M.D.   On: 10-18-2019 23:34   CT HEAD WO CONTRAST  Result Date: 10/18/2019 CLINICAL DATA:  39 year old female with altered mental status. Status post cardiopulmonary arrest yesterday. EXAM: CT HEAD WITHOUT CONTRAST TECHNIQUE: Contiguous axial images were obtained from the base of the skull through the vertex without intravenous contrast. COMPARISON:  Head CT 01/29/2018. FINDINGS: Brain: No midline shift, mass effect, or evidence of intracranial mass lesion. No ventriculomegaly. No acute intracranial hemorrhage identified. Cerebral sulci are preserved. Gray-white matter differentiation appears stable and within normal limits. No cortically based acute infarct identified. Vascular: No suspicious intracranial vascular hyperdensity. Skull: No acute osseous abnormality identified. Sinuses/Orbits: Intubated. Small volume fluid in the visible pharynx. Small volume paranasal sinus fluid mostly in the left sphenoid. Tympanic cavities and mastoids remain clear. Other: No acute orbit or scalp soft tissue finding. IMPRESSION: 1. Noncontrast CT appearance of the brain appears stable since last year and within normal limits. No evidence of anoxic injury. 2. Intubated with small volume fluid in the pharynx and sinuses. Electronically Signed   By: Genevie Ann M.D.   On: 10/18/2019 16:14   CT Tibia Fibula Left Wo Contrast  Result Date: 10/13/2019 CLINICAL DATA:  Diabetic with left toe amputation approximately 1  month ago. Recurrent draining open wound. Suspected soft tissue infection. EXAM: CT OF THE LEFT FOOT WITHOUT CONTRAST; CT OF THE LEFT TIBIA AND FIBULA  WITHOUT CONTRAST TECHNIQUE: Multidetector CT imaging of the left lower leg and left foot was performed according to the standard protocol. Multiplanar CT image reconstructions were also generated. COMPARISON:  Left foot radiographs 09/13/2019 and 09/05/2019. FINDINGS: Bones/Joint/Cartilage Since the prior radiographs, the patient has undergone medial forefoot amputation with resection of the 1st metatarsal and great toe and amputation through the mid 2nd metatarsal. There is new lateral dislocation at the 3rd metatarsophalangeal joint with associated fragmentation the proximal 3rd phalangeal base. There is also new fragmentation of the 4th proximal phalangeal base. There is also fragmentation of the 3rd metatarsal base and distal cuboid. There is surrounding multifocal ill-defined fluid and air in these areas, and these findings could represent infection or neuropathic change. No acute osseous findings or bone destruction identified within the hindfoot or lower leg. No significant ankle or knee joint effusion. Ligaments Suboptimally assessed by CT. The cruciate ligaments are grossly intact at the knee. Muscles and Tendons The ankle and foot tendons appear intact without significant tenosynovitis. No focal intramuscular fluid collections are identified. Soft tissues There is irregular, deep soft tissue ulceration medially at the forefoot along the amputation. There are underlying diffuse soft tissue inflammatory changes with ill-defined fluid collections and gas bubbles, especially around the Lisfranc joint and the osseous fragmentation described above. There is diffuse subcutaneous edema extending into the hindfoot and lower leg. No focal fluid collections are seen within the lower leg. There is no evidence of foreign body. IMPRESSION: 1. Interval medial forefoot amputation with resection of the 1st metatarsal and great toe and amputation through the mid 2nd metatarsal. 2. Extensive inflammatory changes in the  forefoot and midfoot with multifocal ill-defined fluid collections and air bubbles suspicious for recurrent soft tissue infection. 3. New multifocal osseous fragmentation/fractures involving the 3rd and 4th proximal phalangeal bases, 3rd metatarsal base and distal cuboid. There is lateral dislocation at the 3rd MTP joint. These findings could be neuropathic, but given the close approximation to the soft tissue findings and the rapid development, osteomyelitis cannot be excluded. 4. No acute osseous findings or bone destruction identified within the hindfoot or lower leg. Electronically Signed   By: Richardean Sale M.D.   On: 10/13/2019 09:19   CT Foot Left Wo Contrast  Result Date: 10/13/2019 CLINICAL DATA:  Diabetic with left toe amputation approximately 1 month ago. Recurrent draining open wound. Suspected soft tissue infection. EXAM: CT OF THE LEFT FOOT WITHOUT CONTRAST; CT OF THE LEFT TIBIA AND FIBULA WITHOUT CONTRAST TECHNIQUE: Multidetector CT imaging of the left lower leg and left foot was performed according to the standard protocol. Multiplanar CT image reconstructions were also generated. COMPARISON:  Left foot radiographs 09/13/2019 and 09/05/2019. FINDINGS: Bones/Joint/Cartilage Since the prior radiographs, the patient has undergone medial forefoot amputation with resection of the 1st metatarsal and great toe and amputation through the mid 2nd metatarsal. There is new lateral dislocation at the 3rd metatarsophalangeal joint with associated fragmentation the proximal 3rd phalangeal base. There is also new fragmentation of the 4th proximal phalangeal base. There is also fragmentation of the 3rd metatarsal base and distal cuboid. There is surrounding multifocal ill-defined fluid and air in these areas, and these findings could represent infection or neuropathic change. No acute osseous findings or bone destruction identified within the hindfoot or lower leg. No significant ankle or knee joint effusion.  Ligaments Suboptimally  assessed by CT. The cruciate ligaments are grossly intact at the knee. Muscles and Tendons The ankle and foot tendons appear intact without significant tenosynovitis. No focal intramuscular fluid collections are identified. Soft tissues There is irregular, deep soft tissue ulceration medially at the forefoot along the amputation. There are underlying diffuse soft tissue inflammatory changes with ill-defined fluid collections and gas bubbles, especially around the Lisfranc joint and the osseous fragmentation described above. There is diffuse subcutaneous edema extending into the hindfoot and lower leg. No focal fluid collections are seen within the lower leg. There is no evidence of foreign body. IMPRESSION: 1. Interval medial forefoot amputation with resection of the 1st metatarsal and great toe and amputation through the mid 2nd metatarsal. 2. Extensive inflammatory changes in the forefoot and midfoot with multifocal ill-defined fluid collections and air bubbles suspicious for recurrent soft tissue infection. 3. New multifocal osseous fragmentation/fractures involving the 3rd and 4th proximal phalangeal bases, 3rd metatarsal base and distal cuboid. There is lateral dislocation at the 3rd MTP joint. These findings could be neuropathic, but given the close approximation to the soft tissue findings and the rapid development, osteomyelitis cannot be excluded. 4. No acute osseous findings or bone destruction identified within the hindfoot or lower leg. Electronically Signed   By: Richardean Sale M.D.   On: 10/13/2019 09:19   IR Removal Tun Cv Cath W/O FL  Result Date: 09/24/2019 INDICATION: Patient with DM, AKI on CKD s/p tunneled HD catheter placement 09/20/19 in IR now leaving AMA - request to IR for removal of tunneled HD catheter. EXAM: REMOVAL OF TUNNELED HEMODIALYSIS CATHETER MEDICATIONS: None COMPLICATIONS: None immediate. PROCEDURE: Informed written consent was obtained from the patient  following an explanation of the procedure, risks, benefits and alternatives to treatment. A time out was performed prior to the initiation of the procedure. Maximal barrier sterile technique was utilized including caps, mask, sterile gowns, sterile gloves, large sterile drape, hand hygiene, and Hibiclens. Utilizing gentle traction, the catheter was removed intact. Hemostasis was obtained with manual compression. A dressing was placed. The patient tolerated the procedure well without immediate post procedural complication. IMPRESSION: Successful removal of tunneled dialysis catheter. Read by Candiss Norse, PA-C Electronically Signed   By: Aletta Edouard M.D.   On: 09/24/2019 11:29   DG Chest Port 1 View  Result Date: 10/13/2019 CLINICAL DATA:  39 year old female with central line placement. EXAM: PORTABLE CHEST 1 VIEW COMPARISON:  Chest radiograph dated 10/11/2019. FINDINGS: Endotracheal tube with tip approximately 4 cm above the carina and enteric tube extending below the diaphragm with tip beyond the inferior margin of the image. Left IJ central venous line with tip over central SVC. Right mid to upper lobe opacities new since the prior radiograph and may represent atelectasis or developing infiltrate. No pleural effusion or pneumothorax. Stable cardiac silhouette. No acute osseous pathology. IMPRESSION: 1. Interval placement of a left IJ central venous line with tip over central SVC. No pneumothorax. 2. Development of right upper lobe atelectasis versus infiltrate. Electronically Signed   By: Anner Crete M.D.   On: 10/11/2019 19:13   EEG adult  Result Date: 10/18/2019 Lora Havens, MD     10/19/2019  8:47 AM Patient Name: Felicia Reynolds MRN: [id number] Epilepsy Attending: Lora Havens Referring Physician/Provider: Dr. Lattie Haw Date: 10/18/2027 Duration: 25.48 mins Patient history: 39 year old female status post cardiac arrest.  EEG to evaluate for seizures. Level of alertness:  Awake, asleep/lethargic AEDs during EEG study: None Technical aspects: This EEG study  was done with scalp electrodes positioned according to the 10-20 International system of electrode placement. Electrical activity was acquired at a sampling rate of 500Hz  and reviewed with a high frequency filter of 70Hz  and a low frequency filter of 1Hz . EEG data were recorded continuously and digitally stored. Description: During awake state, no posterior dominant rhythm was seen.  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  EEG was reactive to tactile stimulation.  Hyperventilation and photic stimulation were not performed.   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  This pattern can be seen in diffuse anoxic/hypoxic brain injury.  No seizures were seen throughout the recording. Lora Havens   ECHOCARDIOGRAM COMPLETE  Result Date: 10/18/2019    ECHOCARDIOGRAM REPORT   Patient Name:   Felicia Reynolds Date of Exam: 10/18/2019 Medical Rec #:  [id number]        Height:       66.0 in Accession #:    [id number]       Weight:       230.0 lb Date of Birth:  1980-05-27         BSA:          2.122 m Patient Age:    65 years         BP:           172/94 mmHg Patient Gender: F                HR:           81 bpm. Exam Location:  Inpatient Procedure: 2D Echo Indications:    cardiac arrest  History:        Patient has prior history of Echocardiogram examinations, most                 recent 09/16/2019. Risk Factors:Diabetes.  Sonographer:    Johny Chess Referring Phys: [id number] [place]  1. Left ventricular ejection fraction, by estimation, is 60 to 65%. The left  ventricle has normal function. The left ventricle has no regional wall motion abnormalities. The left ventricular internal cavity size was mildly dilated. Left ventricular diastolic parameters are consistent with Grade I diastolic dysfunction (impaired relaxation).  2. Right ventricular systolic function is normal. The right ventricular size is normal.  3. The mitral valve is normal in structure. Trivial mitral valve regurgitation. No evidence of mitral stenosis.  4. The aortic valve is normal in structure. Aortic valve regurgitation is not visualized. No aortic stenosis is present.  5. The inferior vena cava is normal in size with greater than 50% respiratory variability, suggesting right atrial pressure of 3 mmHg. FINDINGS  Left Ventricle: Left ventricular ejection fraction, by estimation, is 60 to 65%. The left ventricle has normal function. The left ventricle has no regional wall motion abnormalities. The left ventricular internal cavity size was mildly dilated. There is  no left ventricular hypertrophy. Left ventricular diastolic parameters are consistent with Grade I diastolic dysfunction (impaired relaxation). Right Ventricle: The right ventricular size is normal. No increase in right ventricular wall thickness. Right ventricular systolic function is normal. Left Atrium: Left atrial size was normal in size. Right Atrium: Right atrial size was normal in size. Pericardium: There is no evidence of pericardial effusion. Mitral Valve: The  mitral valve is normal in structure. Trivial mitral valve regurgitation. No evidence of mitral valve stenosis. Tricuspid Valve: The tricuspid valve is normal in structure. Tricuspid valve regurgitation is trivial. No evidence of tricuspid stenosis. Aortic Valve: The aortic valve is normal in structure. Aortic valve regurgitation is not visualized. No aortic stenosis is present. Pulmonic Valve: The pulmonic valve was normal in structure. Pulmonic valve regurgitation is not  visualized. No evidence of pulmonic stenosis. Aorta: The aortic root is normal in size and structure. Venous: The inferior vena cava is normal in size with greater than 50% respiratory variability, suggesting right atrial pressure of 3 mmHg. IAS/Shunts: No atrial level shunt detected by color flow Doppler.  LEFT VENTRICLE PLAX 2D LVIDd:         5.78 cm  Diastology LVIDs:         3.76 cm  LV e' medial:    9.36 cm/s LV PW:         1.00 cm  LV E/e' medial:  9.6 LV IVS:        0.87 cm  LV e' lateral:   10.70 cm/s LVOT diam:     2.30 cm  LV E/e' lateral: 8.4 LV SV:         89 LV SV Index:   42 LVOT Area:     4.15 cm  RIGHT VENTRICLE RV S prime:     18.60 cm/s TAPSE (M-mode): 2.9 cm LEFT ATRIUM             Index LA diam:        3.70 cm 1.74 cm/m LA Vol (A2C):   78.7 ml 37.08 ml/m LA Vol (A4C):   55.9 ml 26.34 ml/m LA Biplane Vol: 68.1 ml 32.09 ml/m  AORTIC VALVE LVOT Vmax:   112.00 cm/s LVOT Vmean:  80.000 cm/s LVOT VTI:    0.214 m  AORTA Ao Root diam: 3.20 cm Ao Asc diam:  3.40 cm MITRAL VALVE MV Area (PHT): 3.42 cm    SHUNTS MV Decel Time: 222 msec    Systemic VTI:  0.21 m MV E velocity: 89.50 cm/s  Systemic Diam: 2.30 cm MV A velocity: 88.30 cm/s MV E/A ratio:  1.01 Ena Dawley MD Electronically signed by Ena Dawley MD Signature Date/Time: 10/18/2019/1:23:24 PM    Final    VAS [place] LOWER EXTREMITY VENOUS (DVT)  Result Date: 10/18/2019  Lower Venous DVTStudy Indications: Swelling, and elevated d-dimer.  Risk Factors: Surgery 10/08/2019 LT below knee amputation. Limitations: Body habitus, poor ultrasound/tissue interface and orthopaedic appliance. Comparison Study: No prior studies. Performing Technologist: Darlin Coco  Examination Guidelines: A complete evaluation includes B-mode imaging, spectral Doppler, color Doppler, and power Doppler as needed of all accessible portions of each vessel. Bilateral testing is considered an integral part of a complete examination. Limited examinations for reoccurring  indications may be performed as noted. The reflux portion of the exam is performed with the patient in reverse Trendelenburg.  +---------+---------------+---------+-----------+----------+-------------------+ RIGHT    CompressibilityPhasicitySpontaneityPropertiesThrombus Aging      +---------+---------------+---------+-----------+----------+-------------------+ CFV      Full           Yes      Yes                                      +---------+---------------+---------+-----------+----------+-------------------+ SFJ      Full                                                             +---------+---------------+---------+-----------+----------+-------------------+  FV Prox  Full                                                             +---------+---------------+---------+-----------+----------+-------------------+ FV Mid                  Yes      Yes                                      +---------+---------------+---------+-----------+----------+-------------------+ FV Distal               Yes      Yes                                      +---------+---------------+---------+-----------+----------+-------------------+ PFV      Full                                                             +---------+---------------+---------+-----------+----------+-------------------+ POP      Full           Yes      Yes                                      +---------+---------------+---------+-----------+----------+-------------------+ PTV                                                   Patent by color     +---------+---------------+---------+-----------+----------+-------------------+ PERO                                                  Not well visualized +---------+---------------+---------+-----------+----------+-------------------+   +---------+---------------+---------+-----------+----------+-------------------+ LEFT      CompressibilityPhasicitySpontaneityPropertiesThrombus Aging      +---------+---------------+---------+-----------+----------+-------------------+ CFV      Full           Yes      Yes                                      +---------+---------------+---------+-----------+----------+-------------------+ SFJ      Full                                                             +---------+---------------+---------+-----------+----------+-------------------+ FV Prox  Full                                                             +---------+---------------+---------+-----------+----------+-------------------+  FV Mid                  Yes      Yes                                      +---------+---------------+---------+-----------+----------+-------------------+ FV Distal                                             Unable to visualize +---------+---------------+---------+-----------+----------+-------------------+ PFV      Full                                                             +---------+---------------+---------+-----------+----------+-------------------+ POP                                                   Unable to visualize +---------+---------------+---------+-----------+----------+-------------------+     Summary: RIGHT: - There is no evidence of deep vein thrombosis in the lower extremity. However, portions of this examination were limited- see technologist comments above.  - No cystic structure found in the popliteal fossa.  LEFT: - There is no evidence of deep vein thrombosis in the lower extremity. However, portions of this examination were limited- see technologist comments above.  *See table(s) above for measurements and observations. Electronically signed by Servando Snare MD on 10/18/2019 at 2:09:42 PM.    Final     Microbiology Recent Results (from the past 240 hour(s))  Blood culture (routine x 2)     Status: Abnormal   Collection Time:  11/04/2019 10:52 PM   Specimen: BLOOD  Result Value Ref Range Status   Specimen Description   Final    BLOOD Performed at [facility], [address] [place], [place] [address]    Special Requests   Final    NONE Performed at [facility], [address], [place], [place] [address]    Culture  Setup Time   Final    GRAM NEGATIVE RODS AEROBIC BOTTLE ONLY Gram Stain Report Called to,Read Back By and Verified With: CARDWELL L. @ [id number] GY [id number] BY HENDERSON L. Organism ID to follow GRAM NEGATIVE RODS ANAEROBIC BOTTLE ONLY RESULT PREV CALLED CRITICAL RESULT CALLED TO, READ BACK BY AND VERIFIED WITH: PHARMD A MEYER [id number] AT 1645 BY CM GRAM POSITIVE COCCI IN PAIRS ANAEROBIC BOTTLE ONLY CRITICAL RESULT CALLED TO, READ BACK BY AND VERIFIED WITH: J. LEDFORD,PHARMD [id number] 10/14/2019 Mena Goes Performed at [facility], [place] [address] [place], [place] [address]    Culture ESCHERICHIA COLI (A)  Final   Report Status 10/15/2019 FINAL  Final   Organism ID, Bacteria ESCHERICHIA COLI  Final      Susceptibility   Escherichia coli - MIC*    AMPICILLIN <=2 SENSITIVE Sensitive     CEFAZOLIN <=4 SENSITIVE Sensitive     CEFEPIME <=0.12 SENSITIVE Sensitive     CEFTAZIDIME <=1 SENSITIVE Sensitive     CEFTRIAXONE <=0.25 SENSITIVE Sensitive  CIPROFLOXACIN <=0.25 SENSITIVE Sensitive     GENTAMICIN <=1 SENSITIVE Sensitive     IMIPENEM <=0.25 SENSITIVE Sensitive     TRIMETH/SULFA >=320 RESISTANT Resistant     AMPICILLIN/SULBACTAM <=2 SENSITIVE Sensitive     PIP/TAZO <=4 SENSITIVE Sensitive     * ESCHERICHIA COLI  Blood Culture ID Panel (Reflexed)     Status: Abnormal   Collection Time: 10/14/2019 10:52 PM  Result Value Ref Range Status   Enterococcus faecalis NOT DETECTED NOT DETECTED Final   Enterococcus Faecium NOT DETECTED NOT DETECTED Final   Listeria monocytogenes NOT DETECTED NOT DETECTED Final   Staphylococcus species NOT DETECTED NOT DETECTED Final   Staphylococcus aureus (BCID) NOT  DETECTED NOT DETECTED Final   Staphylococcus epidermidis NOT DETECTED NOT DETECTED Final   Staphylococcus lugdunensis NOT DETECTED NOT DETECTED Final   Streptococcus species NOT DETECTED NOT DETECTED Final   Streptococcus agalactiae NOT DETECTED NOT DETECTED Final   Streptococcus pneumoniae NOT DETECTED NOT DETECTED Final   Streptococcus pyogenes NOT DETECTED NOT DETECTED Final   A.calcoaceticus-baumannii NOT DETECTED NOT DETECTED Final   Bacteroides fragilis NOT DETECTED NOT DETECTED Final   Enterobacterales DETECTED (A) NOT DETECTED Final    Comment: Enterobacterales represent a large order of gram negative bacteria, not a single organism. CRITICAL RESULT CALLED TO, READ BACK BY AND VERIFIED WITH: PHARMD A MEYER [id number] AT 1645 BY CM    Enterobacter cloacae complex NOT DETECTED NOT DETECTED Final   Escherichia coli DETECTED (A) NOT DETECTED Final    Comment: CRITICAL RESULT CALLED TO, READ BACK BY AND VERIFIED WITH: PHARMD A MEYER [id number] AT 1645 BY CM    Klebsiella aerogenes NOT DETECTED NOT DETECTED Final   Klebsiella oxytoca NOT DETECTED NOT DETECTED Final   Klebsiella pneumoniae NOT DETECTED NOT DETECTED Final   Proteus species NOT DETECTED NOT DETECTED Final   Salmonella species NOT DETECTED NOT DETECTED Final   Serratia marcescens NOT DETECTED NOT DETECTED Final   Haemophilus influenzae NOT DETECTED NOT DETECTED Final   Neisseria meningitidis NOT DETECTED NOT DETECTED Final   Pseudomonas aeruginosa NOT DETECTED NOT DETECTED Final   Stenotrophomonas maltophilia NOT DETECTED NOT DETECTED Final   Candida albicans NOT DETECTED NOT DETECTED Final   Candida auris NOT DETECTED NOT DETECTED Final   Candida glabrata NOT DETECTED NOT DETECTED Final   Candida krusei NOT DETECTED NOT DETECTED Final   Candida parapsilosis NOT DETECTED NOT DETECTED Final   Candida tropicalis NOT DETECTED NOT DETECTED Final   Cryptococcus neoformans/gattii NOT DETECTED NOT DETECTED Final   CTX-M ESBL NOT  DETECTED NOT DETECTED Final   Carbapenem resistance IMP NOT DETECTED NOT DETECTED Final   Carbapenem resistance KPC NOT DETECTED NOT DETECTED Final   Carbapenem resistance NDM NOT DETECTED NOT DETECTED Final   Carbapenem resist OXA 48 LIKE NOT DETECTED NOT DETECTED Final   Carbapenem resistance VIM NOT DETECTED NOT DETECTED Final    Comment: Performed at [facility], [address] [place], [place] [address]  Blood Culture ID Panel (Reflexed)     Status: None   Collection Time: 11/03/2019 10:52 PM  Result Value Ref Range Status   Enterococcus faecalis NOT DETECTED NOT DETECTED Final   Enterococcus Faecium NOT DETECTED NOT DETECTED Final   Listeria monocytogenes NOT DETECTED NOT DETECTED Final   Staphylococcus species NOT DETECTED NOT DETECTED Final   Staphylococcus aureus (BCID) NOT DETECTED NOT DETECTED Final   Staphylococcus epidermidis NOT DETECTED NOT DETECTED Final   Staphylococcus lugdunensis NOT DETECTED NOT DETECTED  Final   Streptococcus species NOT DETECTED NOT DETECTED Final   Streptococcus agalactiae NOT DETECTED NOT DETECTED Final   Streptococcus pneumoniae NOT DETECTED NOT DETECTED Final   Streptococcus pyogenes NOT DETECTED NOT DETECTED Final   A.calcoaceticus-baumannii NOT DETECTED NOT DETECTED Final   Bacteroides fragilis NOT DETECTED NOT DETECTED Final   Enterobacterales NOT DETECTED NOT DETECTED Final   Enterobacter cloacae complex NOT DETECTED NOT DETECTED Final   Escherichia coli NOT DETECTED NOT DETECTED Final   Klebsiella aerogenes NOT DETECTED NOT DETECTED Final   Klebsiella oxytoca NOT DETECTED NOT DETECTED Final   Klebsiella pneumoniae NOT DETECTED NOT DETECTED Final   Proteus species NOT DETECTED NOT DETECTED Final   Salmonella species NOT DETECTED NOT DETECTED Final   Serratia marcescens NOT DETECTED NOT DETECTED Final   Haemophilus influenzae NOT DETECTED NOT DETECTED Final   Neisseria meningitidis NOT DETECTED NOT DETECTED Final   Pseudomonas  aeruginosa NOT DETECTED NOT DETECTED Final   Stenotrophomonas maltophilia NOT DETECTED NOT DETECTED Final   Candida albicans NOT DETECTED NOT DETECTED Final   Candida auris NOT DETECTED NOT DETECTED Final   Candida glabrata NOT DETECTED NOT DETECTED Final   Candida krusei NOT DETECTED NOT DETECTED Final   Candida parapsilosis NOT DETECTED NOT DETECTED Final   Candida tropicalis NOT DETECTED NOT DETECTED Final   Cryptococcus neoformans/gattii NOT DETECTED NOT DETECTED Final    Comment: Performed at [facility], [address] [place], [place] [address]  Blood culture (routine x 2)     Status: None   Collection Time: 10/27/2019 10:54 PM   Specimen: BLOOD  Result Value Ref Range Status   Specimen Description BLOOD  Final   Special Requests NONE  Final   Culture   Final    NO GROWTH 5 DAYS Performed at [facility], [address], [place], [place] [address]    Report Status 10/15/2019 FINAL  Final  Respiratory Panel by RT PCR (Flu A&B, Covid) - Nasopharyngeal Swab     Status: None   Collection Time: 10/13/19  3:11 AM   Specimen: Nasopharyngeal Swab  Result Value Ref Range Status   SARS Coronavirus 2 by RT PCR NEGATIVE NEGATIVE Final    Comment: (NOTE) SARS-CoV-2 target nucleic acids are NOT DETECTED.  The SARS-CoV-2 RNA is generally detectable in upper respiratoy specimens during the acute phase of infection. The lowest concentration of SARS-CoV-2 viral copies this assay can detect is 131 copies/mL. A negative result does not preclude SARS-Cov-2 infection and should not be used as the sole basis for treatment or other patient management decisions. A negative result may occur with  improper specimen collection/handling, submission of specimen other than nasopharyngeal swab, presence of viral mutation(s) within the areas targeted by this assay, and inadequate number of viral copies (<131 copies/mL). A negative result must be combined with clinical observations, patient history,  and epidemiological information. The expected result is Negative.  Fact Sheet for Patients:  PinkCheek.be  Fact Sheet for Healthcare Providers:  GravelBags.it  This test is no t yet approved or cleared by the [place] FDA and  has been authorized for detection and/or diagnosis of SARS-CoV-2 by FDA under an Emergency Use Authorization (EUA). This EUA will remain  in effect (meaning this test can be used) for the duration of the COVID-19 declaration under Section 564(b)(1) of the Act, 21 U.S.C. section 360bbb-3(b)(1), unless the authorization is terminated or revoked sooner.     Influenza A by PCR NEGATIVE NEGATIVE Final   Influenza  B by PCR NEGATIVE NEGATIVE Final    Comment: (NOTE) The Xpert Xpress SARS-CoV-2/FLU/RSV assay is intended as an aid in  the diagnosis of influenza from Nasopharyngeal swab specimens and  should not be used as a sole basis for treatment. Nasal washings and  aspirates are unacceptable for Xpert Xpress SARS-CoV-2/FLU/RSV  testing.  Fact Sheet for Patients: PinkCheek.be  Fact Sheet for Healthcare Providers: GravelBags.it  This test is not yet approved or cleared by the [place] FDA and  has been authorized for detection and/or diagnosis of SARS-CoV-2 by  FDA under an Emergency Use Authorization (EUA). This EUA will remain  in effect (meaning this test can be used) for the duration of the  Covid-19 declaration under Section 564(b)(1) of the Act, 21  U.S.C. section 360bbb-3(b)(1), unless the authorization is  terminated or revoked. Performed at [facility], [address], [place], [place] [address]   MRSA PCR Screening     Status: None   Collection Time: 10/09/2019  5:43 PM   Specimen: Nasopharyngeal  Result Value Ref Range Status   MRSA by PCR NEGATIVE NEGATIVE Final    Comment:        The GeneXpert MRSA Assay (FDA approved  for NASAL specimens only), is one component of a comprehensive MRSA colonization surveillance program. It is not intended to diagnose MRSA infection nor to guide or monitor treatment for MRSA infections. Performed at [facility], [address], [place], [place] [address]     Lab Basic Metabolic Panel: Recent Labs  Lab 10/18/19 0105 10/18/19 0325 10/18/19 1630 10/19/19 0414 10/19/19 1700 10/20/19 0321 10/21/19 0353  NA 139 139  --  139  --  141 143  K 4.2 4.1  --  4.2  --  4.4 4.8  CL 107 105  --  106  --  108 110  CO2 18* 19*  --  22  --  19* 22  GLUCOSE 119* 112*  --  171*  --  309* 298*  BUN 62* 63*  --  68*  --  74* 85*  CREATININE 3.97* 4.03*  --  4.52*  --  4.76* 4.63*  CALCIUM 7.4* 7.6*  --  7.3*  --  7.2* 7.4*  MG  --   --  1.7 1.8 1.8  --   --   PHOS  --   --  6.0* 6.6* 6.8*  --   --    Liver Function Tests: Recent Labs  Lab 10/18/19 0105 10/18/19 0325 10/19/19 0414 10/20/19 0321 10/21/19 0353  AST 1,728* 1,739* 429* 115* 39  ALT 394* 424* 191* 97* 51*  ALKPHOS 317* 337* 258* 352* 317*  BILITOT 1.7* 2.0* 1.0 1.1 0.8  PROT 5.5* 5.6* 5.3* 5.5* 5.6*  ALBUMIN 1.4* 1.5* 1.4* 1.4* 1.5*   No results for input(s): LIPASE, AMYLASE in the last 168 hours. No results for input(s): AMMONIA in the last 168 hours. CBC: Recent Labs  Lab 10/18/19 0105 10/18/19 0325 10/19/19 0414 10/20/19 0321 10/21/19 0353  WBC 20.1* 18.3* 14.9* 14.4* 14.9*  HGB 7.8* 8.0* 7.6* 7.6* 7.6*  HCT 24.1* 24.2* 23.1* 23.8* 24.6*  MCV 79.5* 80.1 79.1* 79.9* 82.0  PLT 137* 138* 166 164 157   Cardiac Enzymes: Recent Labs  Lab 10/18/19 0521  CKTOTAL 119   Sepsis Labs: Recent Labs  Lab 10/18/19 0105 10/18/19 0105 10/18/19 0325 10/19/19 0414 10/20/19 0321 10/21/19 0353  WBC 20.1*   < > 18.3* 14.9* 14.4* 14.9*  LATICACIDVEN 1.2  --   --   --   --   --    < > =  values in this interval not displayed.    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

## 2019-11-05 NOTE — Progress Notes (Signed)
Patient ID: Felicia Reynolds, female   DOB: [birth year], 39 y.o.   MRN: [id number] No issues with amputation, no drainage in VAC canister, limb protector in place.

## 2019-11-05 DEATH — deceased

## 2021-07-14 IMAGING — DX DG FOOT COMPLETE 3+V*L*
3 series · 3 of 3 positions shown · non-contrast
Comparison: September 05, 2019

CLINICAL DATA: Soft tissue infection

EXAM:
LEFT FOOT - COMPLETE 3+ VIEW

[foot ap]
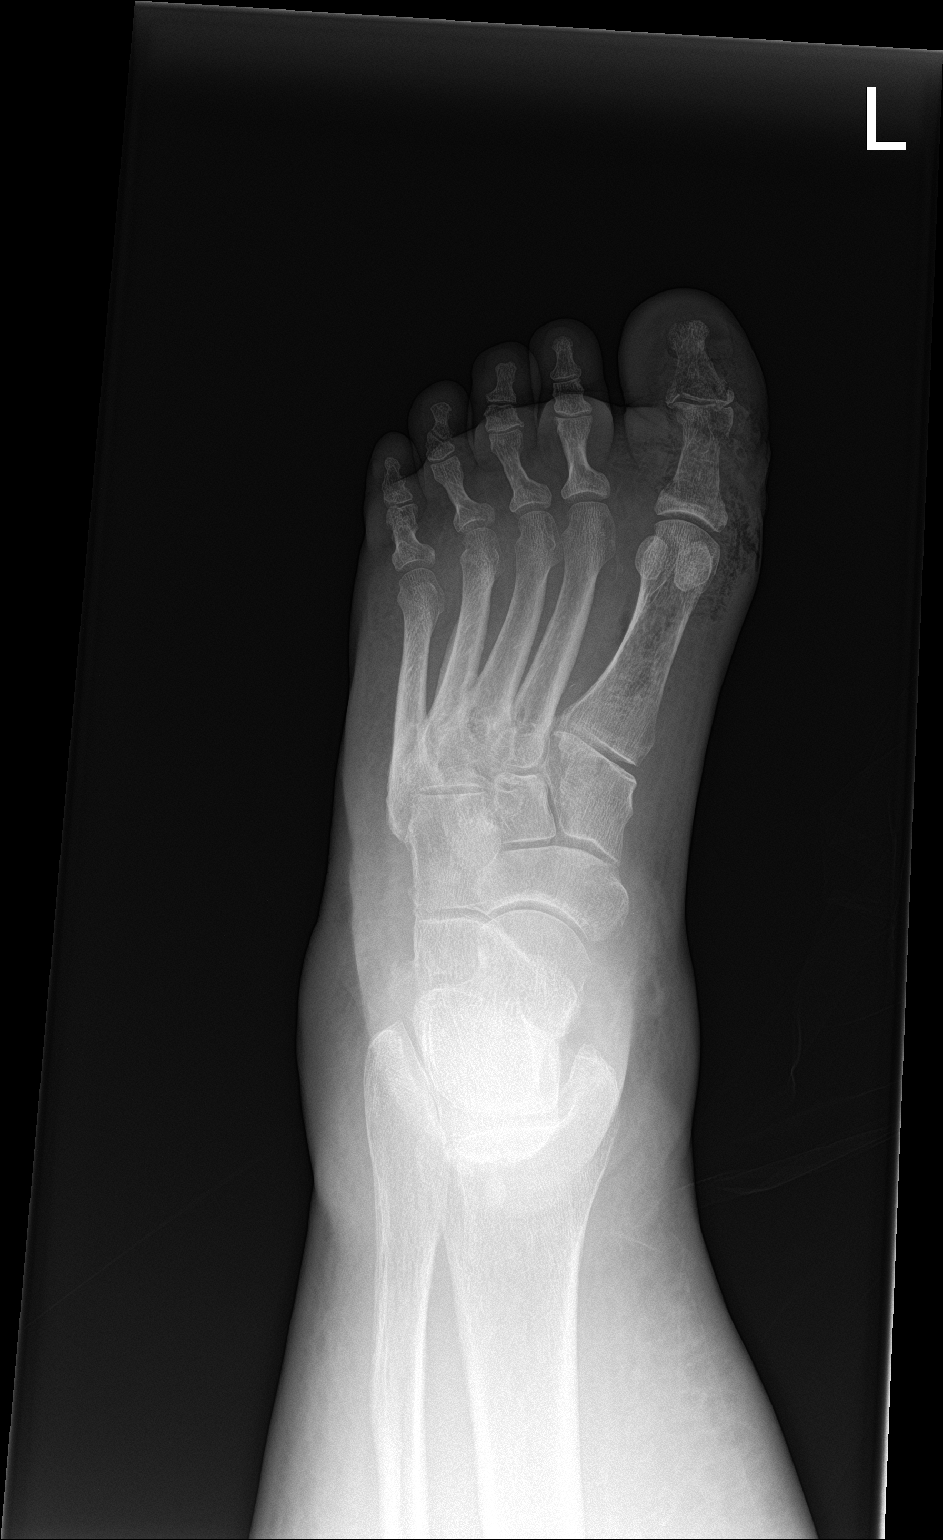

[foot obl]
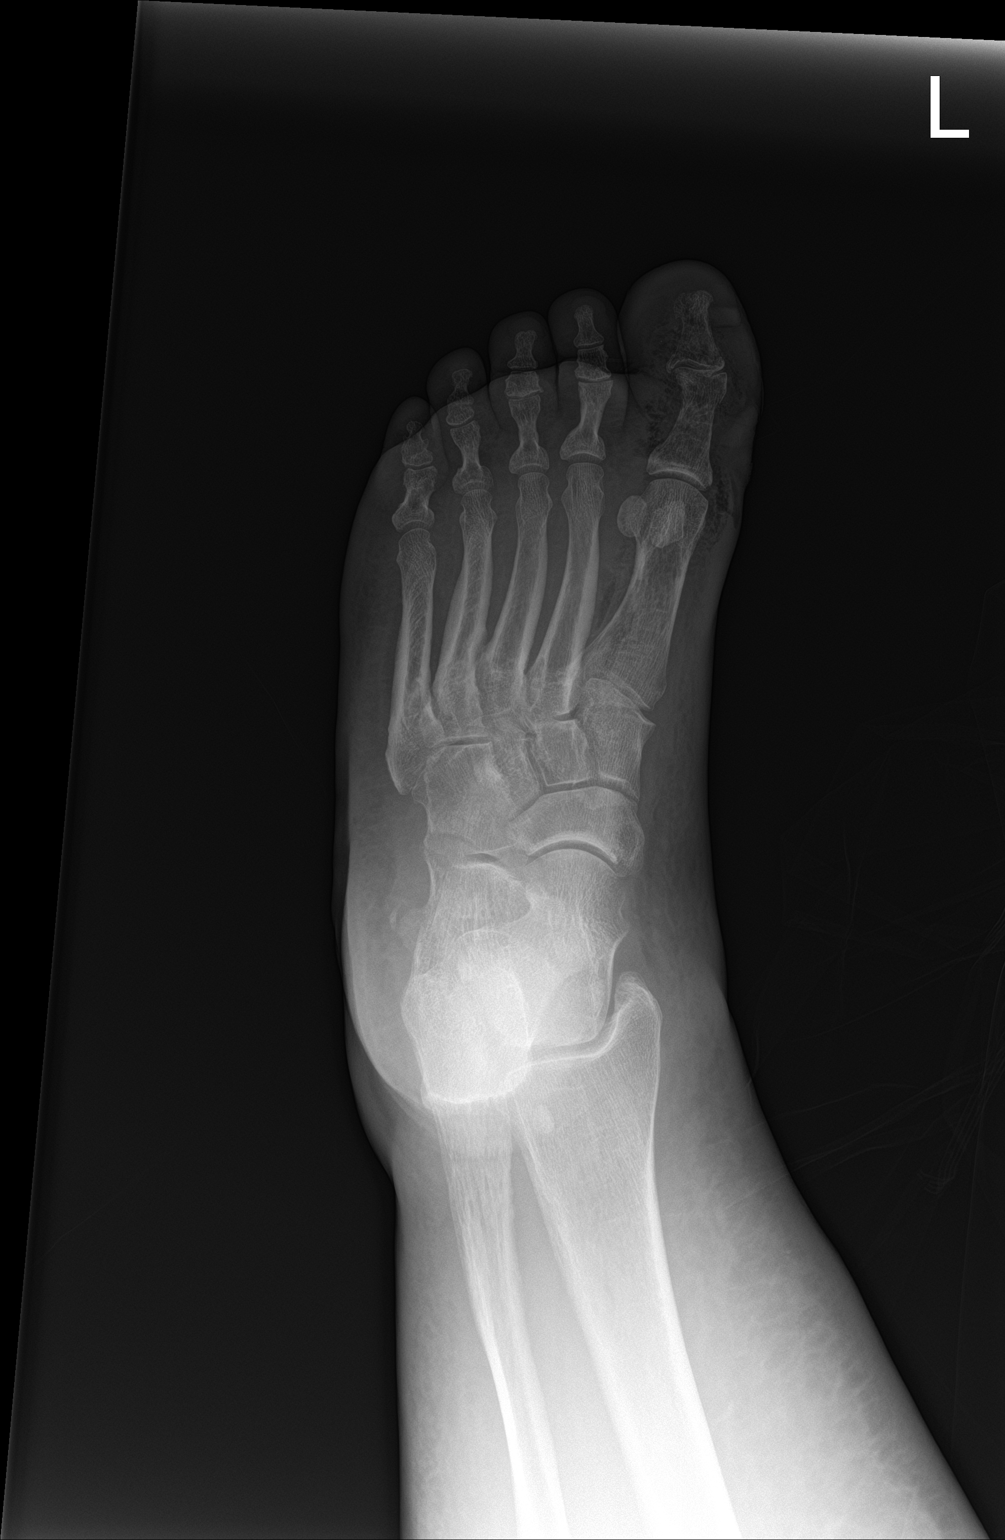

[foot lat]
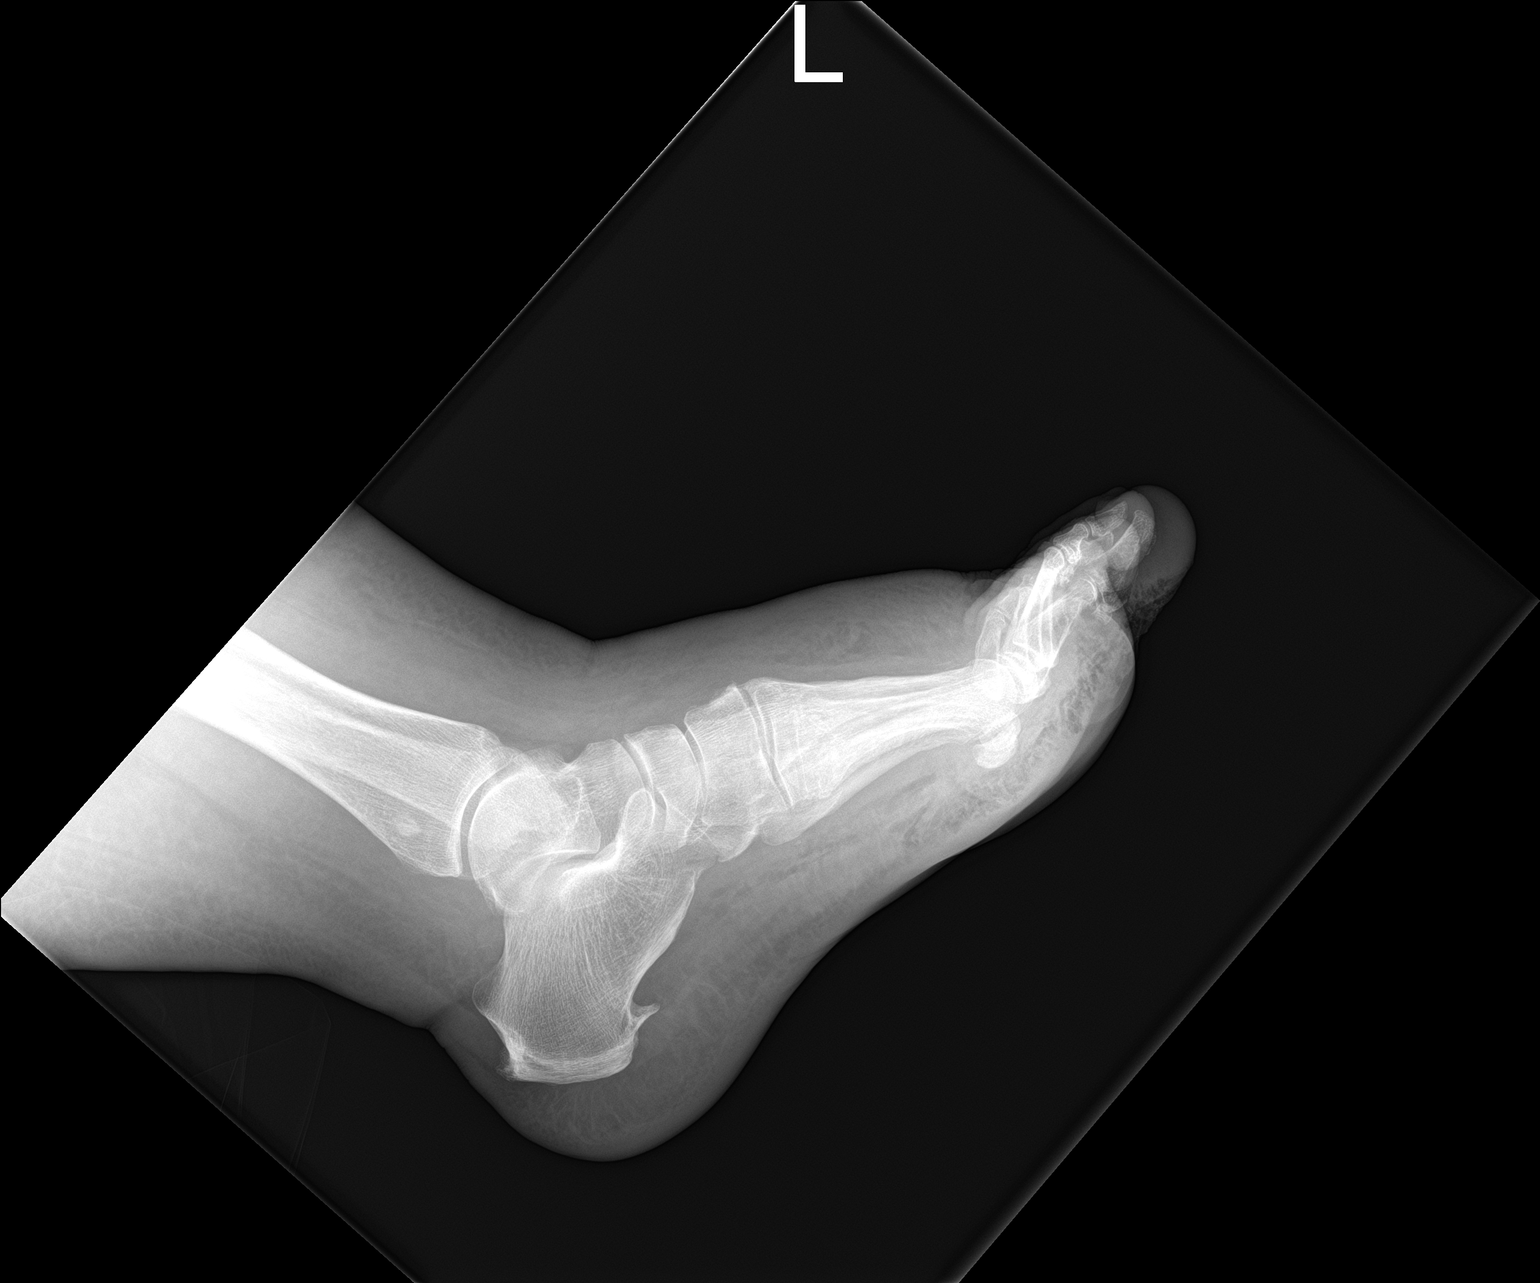

[3 of 3 positions shown; findings below may reference images not displayed]

FINDINGS: Frontal, oblique, and lateral views were obtained. There is
extensive soft tissue air tracking through the first digit from the
proximal first metatarsal to the distal aspect of the first digit.
There is a focal area of apparent rarefaction along the distal
aspect of the first distal phalanx along the medial and lateral
compartments. There may be a degree of bony destruction in this
region. No similar changes suggesting bony destruction elsewhere.
There is a fracture along the lateral aspect of the proximal portion
of the first distal phalanx. No other fracture evident. No
dislocation. No joint space narrowing.
IMPRESSION: 1. Soft tissue air tracking along the first digit extending from the
first distal phalanx to the proximal first metatarsal consistent
with soft tissue infection.

2. Concern for bony destruction along the distal aspect of the first
proximal phalanx.

3. Fracture along the lateral aspect of the proximal portion of the
first distal phalanx.

## 2021-07-21 IMAGING — US IR FLUORO GUIDE CV LINE*R*
1 series · 2 of 2 positions shown · non-contrast
Comparison: none

INDICATION: 39-year-old female referred for tunneled hemodialysis catheter

[Series 1: ir fluoro guide cv line*right* · 2 of 2 slices shown]
[im 1/2]
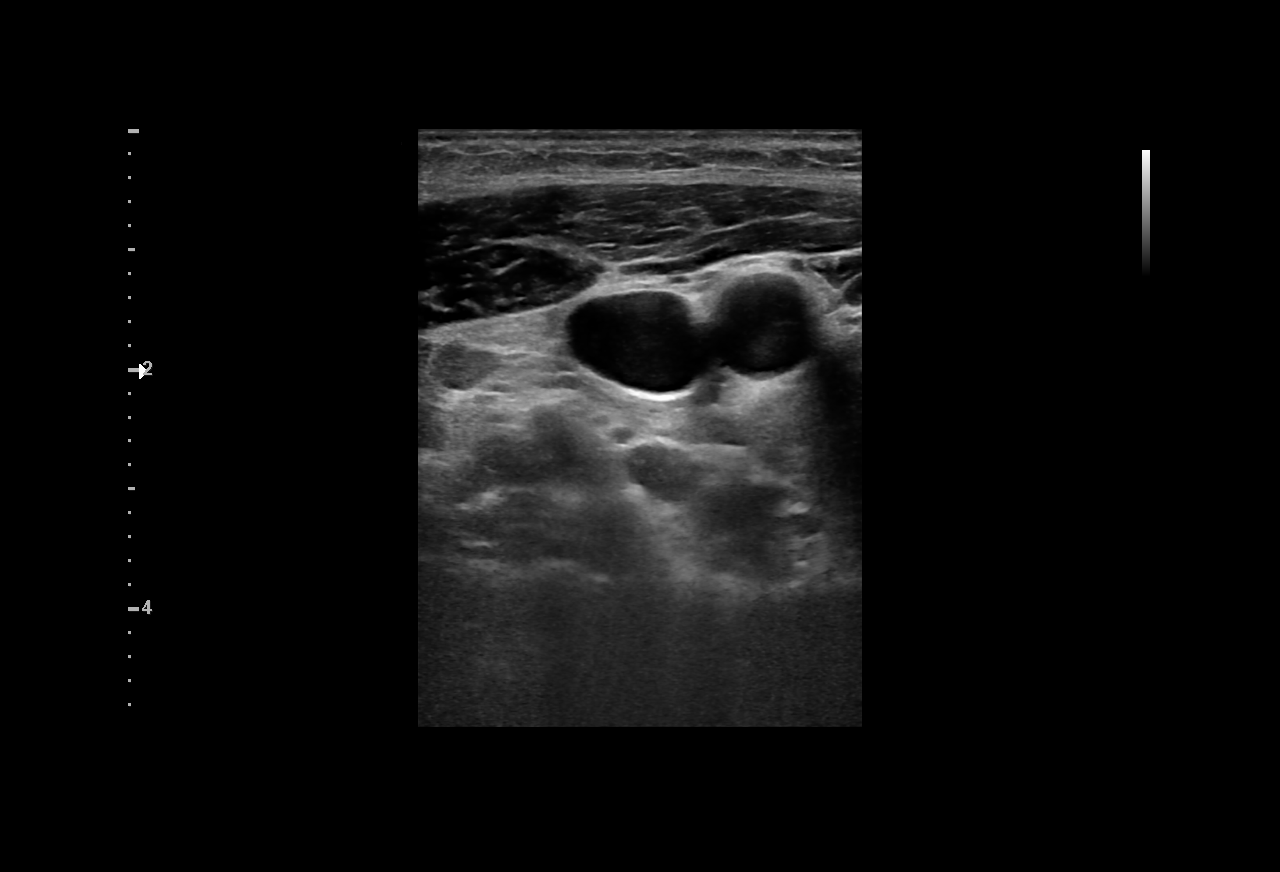
[im 2/2]
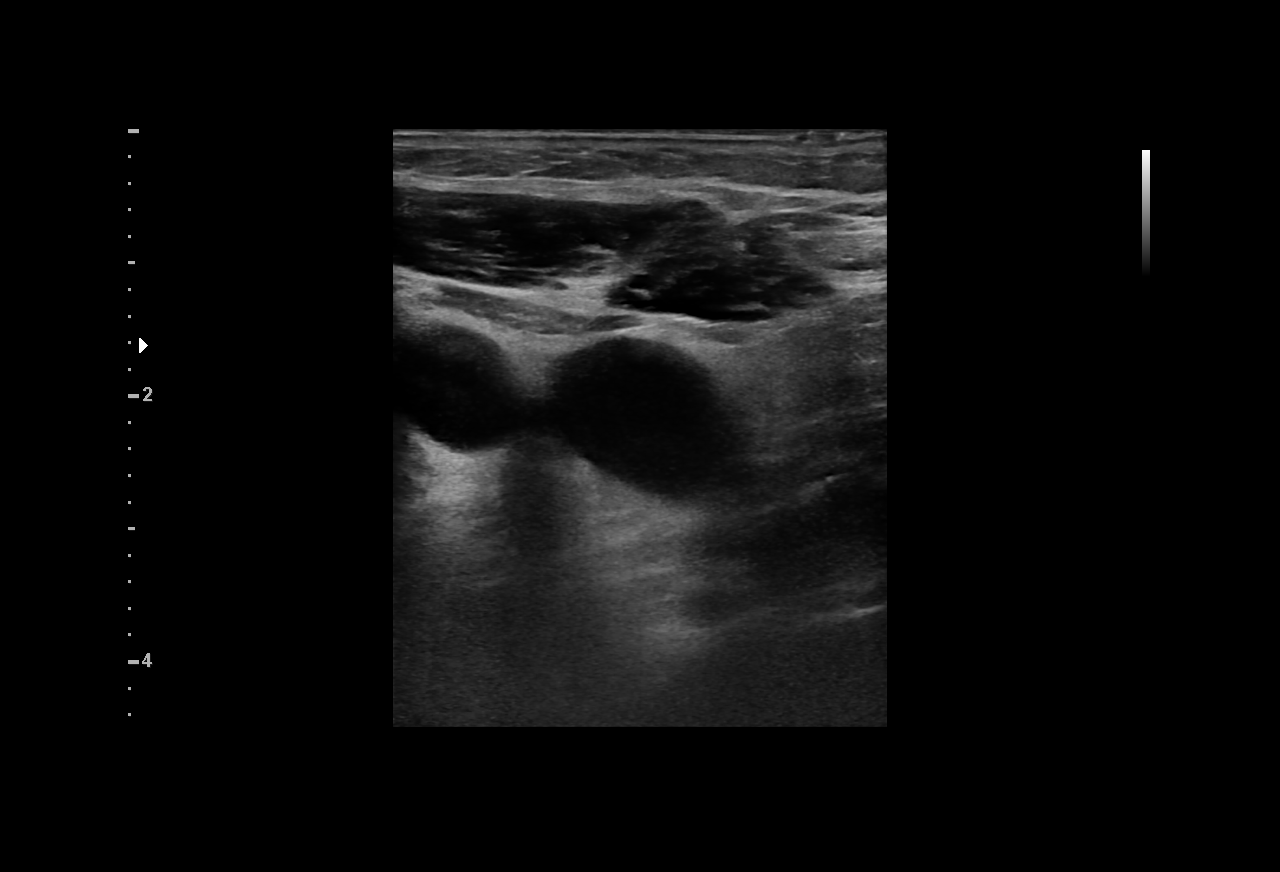

[2 of 2 positions shown; findings below may reference images not displayed]

EXAM:
TUNNELED CENTRAL VENOUS HEMODIALYSIS CATHETER PLACEMENT WITH
ULTRASOUND AND FLUOROSCOPIC GUIDANCE

MEDICATIONS:
2 g Ancef. The antibiotic was given in an appropriate time interval
prior to skin puncture.

ANESTHESIA/SEDATION:
Moderate (conscious) sedation was employed during this procedure. A
total of Versed 1.0 mg and Fentanyl 50 mcg was administered
intravenously.

Moderate Sedation Time: 15 minutes. The patient's level of
consciousness and vital signs were monitored continuously by
radiology nursing throughout the procedure under my direct
supervision.

FLUOROSCOPY TIME:  Fluoroscopy Time: 0 minutes 12 seconds (1 mGy).

COMPLICATIONS:
None



Ultrasound survey was performed.

Micropuncture kit was utilized to access the right internal jugular
vein under direct, real-time ultrasound guidance after the overlying
soft tissues were anesthetized with 1% lidocaine with epinephrine.
Stab incision was made with 11 blade scalpel. Microwire was passed
centrally. The microwire was then marked to measure appropriate
internal catheter length. External tunneled length was estimated. A
total tip to cuff length of 19 cm was selected.

035 guidewire was advanced to the level of the IVC.

Skin and subcutaneous tissues of chest wall below the clavicle were
generously infiltrated with 1% lidocaine for local anesthesia. A
small stab incision was made with 11 blade scalpel. The selected
hemodialysis catheter was tunneled in a retrograde fashion from the
anterior chest wall to the venotomy incision.

Serial dilation was performed and then a peel-away sheath was
placed.

The catheter was then placed through the peel-away sheath with tips
ultimately positioned within the superior aspect of the right
atrium. Final catheter positioning was confirmed and documented with
a spot radiographic image. The catheter aspirates and flushes
normally. The catheter was flushed with appropriate volume heparin
dwells.

The catheter exit site was secured with a 0-Prolene retention
suture. Gel-Foam slurry was infused into the soft tissue tract.

The venotomy incision was closed Derma bond and sterile dressing.
Dressings were applied at the chest wall.

Patient tolerated the procedure well and remained hemodynamically
stable throughout.

No complications were encountered and no significant blood loss
encountered.
IMPRESSION: Status post right IJ tunneled hemodialysis catheter. Catheter ready
for use.
